# Patient Record
Sex: Male | Born: 1945 | Race: Black or African American | Hispanic: No | Marital: Married | Smoking: Former smoker
Health system: Southern US, Community
[De-identification: ages and names within clinical notes are randomized; demographics above are authoritative.]

## PROBLEM LIST (undated history)

## (undated) DIAGNOSIS — E119 Type 2 diabetes mellitus without complications: Secondary | ICD-10-CM

## (undated) DIAGNOSIS — Z803 Family history of malignant neoplasm of breast: Secondary | ICD-10-CM

## (undated) DIAGNOSIS — C801 Malignant (primary) neoplasm, unspecified: Secondary | ICD-10-CM

## (undated) DIAGNOSIS — Z800 Family history of malignant neoplasm of digestive organs: Secondary | ICD-10-CM

## (undated) HISTORY — DX: Family history of malignant neoplasm of breast: Z80.3

## (undated) HISTORY — DX: Family history of malignant neoplasm of digestive organs: Z80.0

---

## 2001-10-17 ENCOUNTER — Ambulatory Visit (HOSPITAL_COMMUNITY): Admission: RE | Admit: 2001-10-17 | Discharge: 2001-10-17 | Payer: Self-pay | Admitting: Gastroenterology

## 2001-10-17 ENCOUNTER — Encounter (INDEPENDENT_AMBULATORY_CARE_PROVIDER_SITE_OTHER): Payer: Self-pay | Admitting: *Deleted

## 2002-05-15 ENCOUNTER — Encounter: Payer: Self-pay | Admitting: Endocrinology

## 2002-05-15 ENCOUNTER — Ambulatory Visit (HOSPITAL_COMMUNITY): Admission: RE | Admit: 2002-05-15 | Discharge: 2002-05-15 | Payer: Self-pay | Admitting: Endocrinology

## 2007-02-09 ENCOUNTER — Ambulatory Visit (HOSPITAL_BASED_OUTPATIENT_CLINIC_OR_DEPARTMENT_OTHER): Admission: RE | Admit: 2007-02-09 | Discharge: 2007-02-09 | Payer: Self-pay | Admitting: Orthopedic Surgery

## 2007-08-09 ENCOUNTER — Ambulatory Visit (HOSPITAL_COMMUNITY): Admission: RE | Admit: 2007-08-09 | Discharge: 2007-08-09 | Payer: Self-pay | Admitting: Endocrinology

## 2007-08-09 ENCOUNTER — Encounter (INDEPENDENT_AMBULATORY_CARE_PROVIDER_SITE_OTHER): Payer: Self-pay | Admitting: Endocrinology

## 2007-08-09 ENCOUNTER — Ambulatory Visit: Payer: Self-pay | Admitting: *Deleted

## 2011-04-02 NOTE — Op Note (Signed)
NAME:  Aaron Johnston, Aaron Johnston NO.:  [id number]   MEDICAL RECORD NO.:  [id number]          PATIENT TYPE:  AMB   LOCATION:  DSC                          FACILITY:  [facility]   PHYSICIAN:  Loreta Ave, M.D. DATE OF BIRTH:  1946-06-06   DATE OF PROCEDURE:  02/09/2007  DATE OF DISCHARGE:                               OPERATIVE REPORT   PREOPERATIVE DIAGNOSES:  1. Left elbow chronic olecranon bursitis.  2. Olecranon spur which is fractured with partial tearing of triceps      tendon at the origin.   POSTOPERATIVE DIAGNOSES:  1. Left elbow chronic olecranon bursitis.  2. Olecranon spur which is fractured with partial tearing of triceps      tendon at the origin.   OPERATIVE PROCEDURE:  Exploration of left elbow with excision of  olecranon bursa.  Removal of olecranon broken spurs, debridement of the  triceps tendon and the reattachment and repair of tendon back to the  olecranon augmented with a 5.5 mm bioabsorbable anchor and FiberWire  suture.   SURGEON:  Loreta Ave, M.D.   ASSISTANT:  Genene Churn. Denton Meek.   ANESTHESIA:  General.   BLOOD LOSS:  Minimal.   TOURNIQUET TIME:  45 minutes.   SPECIMENS:  None.   CULTURES:  None.   COMPLICATIONS:  None.   DRESSING:  Sterile compressive with a splint and the elbow at 90  degrees.   PROCEDURE:  The patient is brought to the operating room, placed on the  operating table in the supine position.  After adequate anesthesia had  been obtained, a tourniquet was applied to the upper aspect of the left  arm, prepped and draped in the sterile fashion.  Exsanguinated,  elevation of the Esmarch and tourniquet inflated to 250 mmHg.  A  longitudinal incision over the triceps slightly curved medially around  the tip of the elbow.  The skin and subcutaneous tissue divided.  A  chronically swollen, thickened, enlarged olecranon bursa excised in its  entirety, no evidence of infection.  The olecranon exposed.  Spurs and  loose fragments all removed.  Part of these were attached to the triceps  tendon where there was tearing throughout the middle one-third, almost  full thickness.  Tendon debrided back to healthy tissue. Fluoroscopic  confirmation of adequate removal of spurs and loose bodies.  I then  placed a 5.5 mm anchor in the olecranon and used a FiberWire suture to  weave up into the triceps to firmly attach this back down to the  olecranon.  A nice firm reattachment and repair.  I could bring him  through full motion without excessive tension on the repair.  The wound  was irrigated.  I closed with Vicryl and then staples.  A sterile  compressive dressing was applied.  The tourniquet was deflated and  removed.  Roll on splint applied with the knee at about 90 degrees of  flexion.  Anesthesia reversed, brought to the recovery room, tolerated  the procedure well with no complications.      Loreta Ave, M.D.  Electronically Signed  DFM/MEDQ  D:  02/09/2007  T:  02/09/2007  Job:  [id number]

## 2018-11-02 ENCOUNTER — Emergency Department (HOSPITAL_COMMUNITY)
Admission: EM | Admit: 2018-11-02 | Discharge: 2018-11-02 | Disposition: A | Discharge status: Home / Self Care | Payer: No Typology Code available for payment source | Attending: Emergency Medicine | Admitting: Emergency Medicine

## 2018-11-02 ENCOUNTER — Encounter (HOSPITAL_COMMUNITY): Payer: Self-pay | Admitting: *Deleted

## 2018-11-02 ENCOUNTER — Emergency Department (HOSPITAL_COMMUNITY): Payer: No Typology Code available for payment source

## 2018-11-02 ENCOUNTER — Other Ambulatory Visit: Payer: Self-pay

## 2018-11-02 DIAGNOSIS — E119 Type 2 diabetes mellitus without complications: Secondary | ICD-10-CM | POA: Insufficient documentation

## 2018-11-02 DIAGNOSIS — Y939 Activity, unspecified: Secondary | ICD-10-CM | POA: Insufficient documentation

## 2018-11-02 DIAGNOSIS — S52225A Nondisplaced transverse fracture of shaft of left ulna, initial encounter for closed fracture: Secondary | ICD-10-CM

## 2018-11-02 DIAGNOSIS — W1789XA Other fall from one level to another, initial encounter: Secondary | ICD-10-CM | POA: Insufficient documentation

## 2018-11-02 DIAGNOSIS — S52292A Other fracture of shaft of left ulna, initial encounter for closed fracture: Secondary | ICD-10-CM | POA: Diagnosis not present

## 2018-11-02 DIAGNOSIS — Z79899 Other long term (current) drug therapy: Secondary | ICD-10-CM | POA: Diagnosis not present

## 2018-11-02 DIAGNOSIS — Y929 Unspecified place or not applicable: Secondary | ICD-10-CM | POA: Diagnosis not present

## 2018-11-02 DIAGNOSIS — Y999 Unspecified external cause status: Secondary | ICD-10-CM | POA: Insufficient documentation

## 2018-11-02 DIAGNOSIS — S59912A Unspecified injury of left forearm, initial encounter: Secondary | ICD-10-CM | POA: Diagnosis present

## 2018-11-02 HISTORY — DX: Type 2 diabetes mellitus without complications: E11.9

## 2018-11-02 MED ORDER — HYDROCODONE-ACETAMINOPHEN 5-325 MG PO TABS
1.0000 | ORAL_TABLET | Freq: Once | ORAL | Status: AC
  Administered 2018-11-02: 1 via ORAL
  Filled 2018-11-02: qty 1

## 2018-11-02 MED ORDER — HYDROCODONE-ACETAMINOPHEN 5-325 MG PO TABS: 1.0000 | ORAL_TABLET | Freq: Four times a day (QID) | ORAL | 0 refills | Status: DC | PRN

## 2018-11-02 NOTE — ED Provider Notes (Signed)
[facility] DEPT Provider Note   CSN: [id number] Arrival date & time: 11/02/18  2033     History   Chief Complaint Chief Complaint  Patient presents with  . Arm Pain    HPI Jaquavius Hudler is a 72 y.o. male.  HPI Patient with left forearm injury.  [place] getting out of a truck.  States he landed on cement.  Complaining of pain in his mid forearm.  No other injury.  No numbness or weakness.  Not on anticoagulation.  Did not hit his head. Past Medical History:  Diagnosis Date  . Diabetes mellitus without complication ([place])     There are no active problems to display for this patient.   History reviewed. No pertinent surgical history.      Home Medications    Prior to Admission medications   Medication Sig Start Date End Date Taking? Authorizing Provider  latanoprost (XALATAN) 0.005 % ophthalmic solution Place 1 drop into both eyes at bedtime.   Yes [provider]  Multiple Vitamin (MULTIVITAMIN WITH MINERALS) TABS tablet Take 1 tablet by mouth daily.   Yes [provider]  pioglitazone (ACTOS) 45 MG tablet Take 45 mg by mouth daily. 12/28/13  Yes [provider]  ramipril (ALTACE) 5 MG capsule Take 5 mg by mouth daily.   Yes [provider]  sitaGLIPtin (JANUVIA) 100 MG tablet Take 100 mg by mouth daily. 12/27/13  Yes [provider]  HYDROcodone-acetaminophen (NORCO/VICODIN) 5-325 MG tablet Take 1-2 tablets by mouth every 6 (six) hours as needed. 11/02/18   Davonna Belling, MD    Family History No family history on file.  Social History Social History   Tobacco Use  . Smoking status: Never Smoker  Substance Use Topics  . Alcohol use: Yes  . Drug use: Never     Allergies   Patient has no known allergies.   Review of Systems Review of Systems  Constitutional: Negative for appetite change.  Respiratory: Negative for shortness of breath.   Cardiovascular: Negative for chest pain.    Gastrointestinal: Negative for abdominal pain.  Musculoskeletal: Negative for back pain and neck pain.       Left forearm injury.  Neurological: Negative for weakness and numbness.     Physical Exam Updated Vital Signs BP 133/68 (BP Location: Right Arm)   Pulse 70   Temp 98.3 F (36.8 C) (Oral)   Resp 18   SpO2 98%   Physical Exam HENT:     Head: Normocephalic.     Mouth/Throat:     Mouth: Mucous membranes are moist.  Eyes:     Extraocular Movements: Extraocular movements intact.  Cardiovascular:     Rate and Rhythm: Normal rate and regular rhythm.  Pulmonary:     Effort: Pulmonary effort is normal.  Abdominal:     Tenderness: There is no abdominal tenderness.  Musculoskeletal:     Comments: Tenderness over mid to distal ulna on left forearm.  Radial median ulnar nerve sensation in left hand intact.  No tenderness over proximal or distal radius.  No wrist tenderness.  No shoulder tenderness.  Skin:    General: Skin is warm.     Capillary Refill: Capillary refill takes less than 2 seconds.  Neurological:     General: No focal deficit present.     Mental Status: He is alert.      ED Treatments / Results  Labs (all labs ordered are listed, but only abnormal results are displayed) Labs  Reviewed - No data to display  EKG None  Radiology Dg Forearm Left  Result Date: 11/02/2018 CLINICAL DATA:  Left arm injury EXAM: LEFT FOREARM - 2 VIEW COMPARISON:  None. FINDINGS: Acute fracture through the distal shaft of the ulna with minimal volar angulation of the distal fracture fragment. Possible step-off deformity at the radial head neck junction. IMPRESSION: 1. Acute minimally angulated fracture involving the distal shaft of the ulna 2. Possible step-off deformity/fracture at the radial head neck junction. Correlate for focal pain to the elbow and consider dedicated elbow radiographs. Electronically Signed   By: Donavan Foil M.D.   On: 11/02/2018 21:23     Procedures Procedures (including critical care time)  Medications Ordered in ED Medications  HYDROcodone-acetaminophen (NORCO/VICODIN) 5-325 MG per tablet 1 tablet (1 tablet Oral Given 11/02/18 2254)     Initial Impression / Assessment and Plan / ED Course  I have reviewed the triage vital signs and the nursing notes.  Pertinent labs & imaging results that were available during my care of the patient were reviewed by me and considered in my medical decision making (see chart for details).     Patient with fall.  Mid to distal ulnar fracture.  No tenderness over proximal radius.  Placed in splint and will have follow-up with orthopedics.  Discussed with Dr. Lenon Curt and states is the general Ortho follow-up since it is not clearly a distal fracture.  Final Clinical Impressions(s) / ED Diagnoses   Final diagnoses:  Closed nondisplaced transverse fracture of shaft of left ulna, initial encounter    ED Discharge Orders         Ordered    HYDROcodone-acetaminophen (NORCO/VICODIN) 5-325 MG tablet  Every 6 hours PRN     11/02/18 2244           Davonna Belling, MD 11/02/18 2333

## 2018-11-02 NOTE — ED Triage Notes (Signed)
Pt ambulatory to triage, reports he was getting out of a truck, fell onto left arm, reports left forearm pain.

## 2019-05-16 ENCOUNTER — Other Ambulatory Visit: Payer: Self-pay | Admitting: *Deleted

## 2019-05-16 DIAGNOSIS — Z20822 Contact with and (suspected) exposure to covid-19: Secondary | ICD-10-CM

## 2019-05-23 LAB — NOVEL CORONAVIRUS, NAA: SARS-CoV-2, NAA: NOT DETECTED

## 2019-12-12 ENCOUNTER — Ambulatory Visit: Payer: Self-pay

## 2019-12-21 ENCOUNTER — Ambulatory Visit: Payer: Medicare Other | Attending: Internal Medicine

## 2019-12-21 DIAGNOSIS — Z23 Encounter for immunization: Secondary | ICD-10-CM

## 2019-12-21 NOTE — Progress Notes (Signed)
   Covid-19 Vaccination Clinic  Name:  Aaron Johnston    MRN: [id number] DOB: 04-29-1946  12/21/2019  Aaron Johnston was observed post Covid-19 immunization for 15 minutes without incidence. He was provided with Vaccine Information Sheet and instruction to access the V-Safe system.   Aaron Johnston was instructed to call 911 with any severe reactions post vaccine: Marland Kitchen Difficulty breathing  . Swelling of your face and throat  . A fast heartbeat  . A bad rash all over your body  . Dizziness and weakness    Immunizations Administered    Name Date Dose VIS Date Route   Pfizer COVID-19 Vaccine 12/21/2019 12:59 PM 0.3 mL 10/26/2019 Intramuscular   Manufacturer: [place]   Lot: [id number]   [place]: [id number]

## 2019-12-23 ENCOUNTER — Ambulatory Visit: Payer: Self-pay

## 2019-12-31 ENCOUNTER — Ambulatory Visit: Payer: Medicare Other | Attending: Internal Medicine

## 2020-01-15 ENCOUNTER — Ambulatory Visit: Payer: Medicare Other | Attending: Internal Medicine

## 2020-01-15 DIAGNOSIS — Z23 Encounter for immunization: Secondary | ICD-10-CM | POA: Insufficient documentation

## 2020-01-15 NOTE — Progress Notes (Signed)
   Covid-19 Vaccination Clinic  Name:  Aaron Johnston    MRN: [id number] DOB: July 10, 1946  01/15/2020  [name] was observed post Covid-19 immunization for 15 minutes without incident. He was provided with Vaccine Information Sheet and instruction to access the V-Safe system.   [name] was instructed to call 911 with any severe reactions post vaccine: Marland Kitchen Difficulty breathing  . Swelling of face and throat  . A fast heartbeat  . A bad rash all over body  . Dizziness and weakness   Immunizations Administered    Name Date Dose VIS Date Route   Pfizer COVID-19 Vaccine 01/15/2020  1:05 PM 0.3 mL 10/26/2019 Intramuscular   Manufacturer: [place]   Lot: [id number]   [place]: [id number]

## 2020-06-26 ENCOUNTER — Other Ambulatory Visit: Payer: Self-pay | Admitting: Family Medicine

## 2020-06-26 DIAGNOSIS — R1033 Periumbilical pain: Secondary | ICD-10-CM

## 2020-07-07 ENCOUNTER — Other Ambulatory Visit: Payer: Self-pay

## 2020-07-07 ENCOUNTER — Ambulatory Visit
Admission: RE | Admit: 2020-07-07 | Discharge: 2020-07-07 | Disposition: A | Discharge status: Home / Self Care | Payer: Medicare Other | Source: Ambulatory Visit | Attending: Family Medicine | Admitting: Family Medicine

## 2020-07-07 DIAGNOSIS — R1033 Periumbilical pain: Secondary | ICD-10-CM

## 2020-07-07 MED ORDER — IOPAMIDOL (ISOVUE-300) INJECTION 61%
100.0000 mL | Freq: Once | INTRAVENOUS | Status: AC | PRN
  Administered 2020-07-07: 100 mL via INTRAVENOUS

## 2020-07-17 ENCOUNTER — Encounter: Payer: Self-pay | Admitting: Oncology

## 2020-07-22 ENCOUNTER — Telehealth: Payer: Self-pay | Admitting: Oncology

## 2020-07-22 ENCOUNTER — Inpatient Hospital Stay: Payer: Medicare Other | Attending: Oncology | Admitting: Oncology

## 2020-07-22 ENCOUNTER — Other Ambulatory Visit: Payer: Self-pay

## 2020-07-22 VITALS — BP 157/79 | HR 81 | Temp 98.2°F | Resp 20 | Ht 69.0 in | Wt 159.0 lb

## 2020-07-22 DIAGNOSIS — D696 Thrombocytopenia, unspecified: Secondary | ICD-10-CM | POA: Insufficient documentation

## 2020-07-22 DIAGNOSIS — C250 Malignant neoplasm of head of pancreas: Secondary | ICD-10-CM | POA: Insufficient documentation

## 2020-07-22 DIAGNOSIS — Z7189 Other specified counseling: Secondary | ICD-10-CM | POA: Diagnosis not present

## 2020-07-22 DIAGNOSIS — G893 Neoplasm related pain (acute) (chronic): Secondary | ICD-10-CM | POA: Insufficient documentation

## 2020-07-22 DIAGNOSIS — C259 Malignant neoplasm of pancreas, unspecified: Secondary | ICD-10-CM | POA: Insufficient documentation

## 2020-07-22 DIAGNOSIS — E119 Type 2 diabetes mellitus without complications: Secondary | ICD-10-CM | POA: Insufficient documentation

## 2020-07-22 DIAGNOSIS — E876 Hypokalemia: Secondary | ICD-10-CM | POA: Insufficient documentation

## 2020-07-22 DIAGNOSIS — Z23 Encounter for immunization: Secondary | ICD-10-CM | POA: Insufficient documentation

## 2020-07-22 DIAGNOSIS — I10 Essential (primary) hypertension: Secondary | ICD-10-CM | POA: Insufficient documentation

## 2020-07-22 DIAGNOSIS — Z5111 Encounter for antineoplastic chemotherapy: Secondary | ICD-10-CM | POA: Insufficient documentation

## 2020-07-22 MED ORDER — HYDROCODONE-ACETAMINOPHEN 5-325 MG PO TABS: 1.0000 | ORAL_TABLET | ORAL | 0 refills | Status: DC | PRN

## 2020-07-22 NOTE — Progress Notes (Signed)
Placed referral for dietician and CSW.

## 2020-07-22 NOTE — Telephone Encounter (Signed)
A new pt appt has been scheduled for Aaron Johnston to see Dr. Benay Spice today at pm. Appt date and time has been given to the pt's wife. Aware to arrive 15 minutes early.

## 2020-07-22 NOTE — Progress Notes (Addendum)
Aaron Johnston   Requesting MD: Aaron Burnet, Md [address] Aaron Johnston,  [place] [id number]   Aaron Johnston 74 y.o.  1946/02/25    Reason for Johnston: Pancreas cancer   HPI: Aaron Johnston reports a history of lower back pain for the past several months.  He saw a chiropractor and obtain no relief of the pain with chiropractic treatment.  He saw Dr. Alroy Johnston and a CT of the abdomen and pelvis on 07/07/2020 revealed diffuse ill-defined hypodense areas in the liver concerning for an infiltrative neoplasm.  Minimal dilatation of the intrahepatic biliary tree.  Atrophy of the body and tail the pancreas is progressive compared to a CT on 03/04/2020.  A 2 x 1 point centimeter focal area of prominence was noted in the pancreas.  An infiltrative retroperitoneal mass encases the celiac axis and SMA.  The mass has progressed compared to the previous CT.  Nonocclusive thrombus was noted in the main portal vein with high-grade narrowing of the proximal portal vein with associated cavernous transformation.  A 1.5 x 3 point centimeter portacaval soft tissue density is felt to represent an enlarged lymph node versus infiltration of the retroperitoneal mass.  The prostate gland is enlarged.  He was referred to Dr. Earlean Johnston.  The CA 19-9 returned elevated at 6373 on 07/02/2020.  He was subsequently referred to Dr. Jerene Johnston for an endoscopic ultrasound on 07/10/2020.  Sludge was seen in the gallbladder.  No pancreas mass was identified.  Tumor thrombus was noted in the PV and SV.  The retroperitoneal mass could not be visualized.  There was a small amount of ascites.  A 1.5 cm aortocaval node was biopsied.  The cytology returned as scant lymphoid material.  No malignancy identified.  Scant cellularity.  He was referred to Dr. Adair Johnston and was taken to the operating room on 07/17/2020 for Port-A-Cath placement and a diagnostic laparoscopy.  There was no ascites or peritoneal  disease.  A 0.8 cm nodule was noted adjacent to the falciform ligament.  There were other nodules.  Segment 3 in segment 4 nodules were biopsied.  A preliminary pathology returned as adenocarcinoma on the segment 3 lesion.  Final pathology is pending.  Aaron Johnston reports improvement in back pain, but he now has abdominal pain.  He was seen by urology in April for urinary frequency.  He was noted to have microscopic hematuria.  A CT of the abdomen and pelvis on 03/04/2020 revealed haziness in the fat of the retroperitoneum and upper abdomen adjacent to the celiac axis and SMA.  No pancreatic mass was identified.  Collateral vessels were noted in the upper abdomen.  No lymphadenopathy.    Past Medical History:  Diagnosis Date  . Diabetes mellitus without complication ([place])     .  Hypertension   .  Chronic thrombocytopenia  Past surgical history: Toe surgery Medications: Reviewed  Allergies: No Known Allergies  Family history: His father died of pancreas cancer at age 45  Social History:   He lives in [place].  He is a retired Airline pilot.  He has a Engineer, technical sales.  He quit smoking cigarettes 20 years ago.  Occasional alcohol use.  No transfusion history.  No risk factor for HIV or hepatitis.  ROS:   Positives include: Anorexia, 20 pound weight loss, diarrhea after eating for the past 2 to 3 weeks, mild headache for 2 to 3 days, low back pain, abdominal pain  A complete ROS was  otherwise negative.  Physical Exam:  Blood pressure (!) 157/79, pulse 81, temperature 98.2 F (36.8 C), temperature source Tympanic, resp. rate 20, height 5\' 9"  (1.753 m), weight 159 lb (72.1 kg), SpO2 100 %.  HEENT: Neck without mass Lungs: Clear bilaterally Cardiac: Regular rate and rhythm Abdomen: No hepatosplenomegaly, no mass, nontender GU: Testes without mass Vascular: No leg edema Lymph nodes: No cervical, supraclavicular, axillary, or inguinal nodes Neurologic: Alert and  oriented, the motor exam appears intact in the upper and lower extremities bilaterally Skin: No rash, healed abdominal incisions Musculoskeletal: No spine tenderness   LAB: [facility] 07/02/2020: CA 19-9 -6373  Creatinine 0.87, bilirubin 1.5, AST 33, ALT 65 Hemoglobin 13.5, platelets 76,000, white count 4.9, ANC 3.2  07/14/2020: Hemoglobin 13.4, platelets 66,000, WBC 4.2, MPV 11.5  Blood smear 07/24/2020: Few ovalocytes and acanthocytes.  The white cell morphology is unremarkable.  The platelets appear mildly decreased in number.  No platelet clumps.  The majority of the platelets are small. Imaging:  CT images from 07/07/2020-reviewed with Aaron Johnston and his wife    Assessment/Plan:   1. 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, nonocclusive thrombus within the main portal vein, mild biliary ductal dilatation, diffuse hypodense/hypoenhancing areas within the liver (edema versus infiltrating neoplasm), 2 x 1.7 cm area of focal prominence in the pancreas  Diagnostic laparoscopy 07/17/2020-segment 3 and 4 liver nodules, biopsied  Elevated CA 19-9  2. Chronic thrombocytopenia 3. Diabetes 4. Hypertension 5. Abdomen/back pain secondary to #1   Disposition:   Aaron Johnston has radiologic, clinical, and laboratory evidence consistent with a diagnosis of metastatic pancreas cancer.  He understands no therapy will be curative.  I discussed the prognosis and treatment options with Aaron Johnston and his wife.  We reviewed the CT images.  We will follow up on the final pathology from the liver biopsy.  I recommend treatment with FOLFIRINOX.  We reviewed potential toxicities associated with the  FOLFIRINOX regimen including the chance for nausea/vomiting, mucositis, diarrhea, alopecia, and hematologic toxicity.  We discussed the rash, hyperpigmentation, sun sensitivity, and hand/foot syndrome associated with 5-fluorouracil.  We reviewed the allergic reaction and various types of neuropathy seen with oxaliplatin.  We discussed the acute and delayed diarrhea from irinotecan.  He agrees to proceed.  He will attend a chemotherapy teaching class.  He will complete 1 cycle of FOLFOX with the plan to add irinotecan to the regimen beginning with cycle 2 or cycle 3.  He will be referred to the genetics counselor.  We will request Foundation 1 testing on the liver biopsy tissue.  I will present his case at the GI tumor conference.  He reports a chronic history of thrombocytopenia.  He had moderate thrombocytopenia on recent lab studies at [facility].  We will follow up on records from Dr. Alroy Johnston and I will review the peripheral blood smear when he is here for baseline lab studies later this week.  We will dose reduce the chemotherapy based on the platelet count.  Aaron Johnston will return for an office visit prior to beginning chemotherapy on 07/28/2020.  Betsy Coder, MD  07/25/2020, 1:45 PM

## 2020-07-22 NOTE — Progress Notes (Signed)
START ON PATHWAY REGIMEN - Pancreatic Adenocarcinoma     A cycle is every 14 days:     Oxaliplatin      Leucovorin      Irinotecan      Fluorouracil   **Always confirm dose/schedule in your pharmacy ordering system**  Patient Characteristics: Metastatic Disease, First Line, PS = 0,1, BRCA1/2 and PALB2  Mutation Absent/Unknown Therapeutic Status: Metastatic Disease Line of Therapy: First Line ECOG Performance Status: 1 BRCA1/2 Mutation Status: Awaiting Test Results PALB2 Mutation Status: Awaiting Test Results Intent of Therapy: Non-Curative / Palliative Intent, Discussed with Patient 

## 2020-07-23 ENCOUNTER — Telehealth: Payer: Self-pay | Admitting: *Deleted

## 2020-07-23 ENCOUNTER — Encounter: Payer: Self-pay | Admitting: Medical Oncology

## 2020-07-23 ENCOUNTER — Telehealth: Payer: Self-pay | Admitting: Oncology

## 2020-07-23 ENCOUNTER — Other Ambulatory Visit: Payer: Self-pay

## 2020-07-23 ENCOUNTER — Other Ambulatory Visit: Payer: Self-pay | Admitting: *Deleted

## 2020-07-23 ENCOUNTER — Encounter: Payer: Self-pay | Admitting: *Deleted

## 2020-07-23 ENCOUNTER — Inpatient Hospital Stay: Payer: Medicare Other | Admitting: Nutrition

## 2020-07-23 DIAGNOSIS — C259 Malignant neoplasm of pancreas, unspecified: Secondary | ICD-10-CM

## 2020-07-23 MED ORDER — PANCRELIPASE (LIP-PROT-AMYL) 36000-114000 UNITS PO CPEP: ORAL_CAPSULE | ORAL | 5 refills | Status: DC

## 2020-07-23 MED ORDER — ONDANSETRON HCL 8 MG PO TABS: 8.0000 mg | ORAL_TABLET | Freq: Three times a day (TID) | ORAL | 2 refills | Status: DC | PRN

## 2020-07-23 MED ORDER — LIDOCAINE-PRILOCAINE 2.5-2.5 % EX CREA: 1.0000 "application " | TOPICAL_CREAM | CUTANEOUS | 2 refills | Status: DC

## 2020-07-23 MED ORDER — PROCHLORPERAZINE MALEATE 10 MG PO TABS: 10.0000 mg | ORAL_TABLET | Freq: Four times a day (QID) | ORAL | 1 refills | Status: DC | PRN

## 2020-07-23 NOTE — Progress Notes (Signed)
Scripts for EMLA, Compazine, Zofran and Creon sent to pharmacy. Notified research department that he is interested in [employer] study. Can meet w/ him before or after his chemo education class.

## 2020-07-23 NOTE — Progress Notes (Signed)
[employer]: Blood Sample Collection to Evaluate Biomarkers in Subjects with Untreated Solid Tumors Dr. Sherrill referred patient to study. I spoke with patient via phone, this afternoon, introducing myself and confirmed that Dr. Sherrill provided patient with a brief explanation about this study and that patient was interested. Patient confirmed he was interested and we made plans to meet after his appointment today with nutritionist, Barbara Neff.  Patient, myself and Candace Smith, CRC, met this afternoon and we reviewed the study ICF and authorization forms, Version 1.0, page by page. Patient confirmed understanding the purpose of the study, that his participation is voluntary, the length of his participation, any benefits to his participating, what information is collected and who will have access to his information, as well as compensation of a $50 gift card, provided by the study. After both forms were reviewed, patient was asked if he had any questions, patient denied having any. Patient did express wanting to take the consent home to review with family and stated he would inform me tomorrow, prior to his scheduled lab appointment. Patient was provided with my contact information and the information pamphlet on Clinical Trials. I thanked patient for his time and interest in study and encouraged him to call me with questions.  Approximately 15 minutes was spent with patient going over the study.  Second eligibility verified by CRC Candace Smith as well as confirmed by Dr. Sherrill.   [employer]: Stool Collection Sub-study (2018-01B) of [employer] Protocol 2018-01: "Blood Sample Collection to Evaluate Biomarkers in Subjects with Untreated Solid Tumors" Candace Smith, CRC reviewed this study with patient, and myself present. Patient declined participation to this sub-study.   C. , RN, BSN, CCRC Clinical Research 07/23/2020 4:35 PM  

## 2020-07-23 NOTE — Progress Notes (Signed)
[place] Work  Clinical Social Work received a referral from Therapist, sports for adjusting to illness.  CSW met with patient in the patient and family support center.  CSW provided education on CSW role and information on the support team and support services at [facility].  CSW and patient discussed common feeling and emotions when being diagnosed with cancer and the importance of support.  Patient identified his wife a positive support.  CSW provided patient with September program calendar and upcoming programing.  CSW also encouraged patient to share resources with his wife.  Patient was appreciative of CSW contact and know to call with questions or concerns.  Johnnye Lana, MSW, LCSW, OSW-C Clinical Social Worker [facility] [phone]

## 2020-07-23 NOTE — Telephone Encounter (Signed)
Spoke with patient today who is aware of his nutrition appointment later this afternoon. Will have an updated calendar printed for patient at time of check in.

## 2020-07-23 NOTE — Telephone Encounter (Signed)
Called to inquire about something for diarrhea MD mentioned calling in yesterday. Informed him that the script for Creon sent in today is supposed to control his diarrhea. He can supplement if needed w/Imodium.

## 2020-07-23 NOTE — Progress Notes (Signed)
74 year old male diagnosed with pancreas cancer.  He is a patient of Dr. Benay Spice.  Plan is for FOLFIRINOX.  Past medical history includes diabetes, hypertension  Medications include Compazine, Zofran, Creon, Actos, and Januvia.  Recent labs not available.  Height: 5 feet 9 inches. Weight: 159 pounds on September 7. Usual body weight: 185 pounds per patient. BMI: 23.48.  Patient reports he is experiencing early satiety. Admits to about a 30 pound weight loss. Reports he is having loose stools after he eats.  He also has abdominal pain.  Noted Creon was added. Patient reports blood sugars are generally good since he has lost a lot of weight.  Nutrition diagnosis:  Food and nutrition related knowledge deficit related to pancreas cancer and associated treatments as evidenced by no prior need for nutrition related information.  Intervention: Educated patient on the importance of smaller more frequent meals and snacks. Reviewed high-calorie high-protein foods. Recommended oral nutrition supplements.  Provided samples.  Encouraged two cartons daily. Brief education on no concentrated sweets diet. Provided fact sheets.  Questions were answered.  Teach back method used.  Contact information given.  Monitoring, evaluation, goals: Patient will tolerate increased calories and protein to minimize further weight loss.  Next visit: Monday, September 13 during infusion.  **Disclaimer: This note was dictated with voice recognition software. Similar sounding words can inadvertently be transcribed and this note may contain transcription errors which may not have been corrected upon publication of note.**

## 2020-07-23 NOTE — Telephone Encounter (Signed)
Scheduled appointments per 9/7 los. Patient is aware of appointment details.

## 2020-07-24 ENCOUNTER — Inpatient Hospital Stay: Payer: Medicare Other

## 2020-07-24 ENCOUNTER — Encounter: Payer: Self-pay | Admitting: Oncology

## 2020-07-24 ENCOUNTER — Encounter: Payer: Self-pay | Admitting: Radiology

## 2020-07-24 DIAGNOSIS — E876 Hypokalemia: Secondary | ICD-10-CM | POA: Diagnosis not present

## 2020-07-24 DIAGNOSIS — Z23 Encounter for immunization: Secondary | ICD-10-CM | POA: Diagnosis not present

## 2020-07-24 DIAGNOSIS — C259 Malignant neoplasm of pancreas, unspecified: Secondary | ICD-10-CM

## 2020-07-24 DIAGNOSIS — G893 Neoplasm related pain (acute) (chronic): Secondary | ICD-10-CM | POA: Diagnosis not present

## 2020-07-24 DIAGNOSIS — D696 Thrombocytopenia, unspecified: Secondary | ICD-10-CM | POA: Diagnosis not present

## 2020-07-24 DIAGNOSIS — E119 Type 2 diabetes mellitus without complications: Secondary | ICD-10-CM | POA: Diagnosis not present

## 2020-07-24 DIAGNOSIS — Z95828 Presence of other vascular implants and grafts: Secondary | ICD-10-CM

## 2020-07-24 DIAGNOSIS — Z5111 Encounter for antineoplastic chemotherapy: Secondary | ICD-10-CM | POA: Diagnosis not present

## 2020-07-24 DIAGNOSIS — I10 Essential (primary) hypertension: Secondary | ICD-10-CM | POA: Diagnosis not present

## 2020-07-24 LAB — CBC WITH DIFFERENTIAL (CANCER CENTER ONLY)
Abs Immature Granulocytes: 0.01 10*3/uL (ref 0.00–0.07)
Basophils Absolute: 0 10*3/uL (ref 0.0–0.1)
Basophils Relative: 0 %
Eosinophils Absolute: 0.2 10*3/uL (ref 0.0–0.5)
Eosinophils Relative: 4 %
HCT: 36.7 % — ABNORMAL LOW (ref 39.0–52.0)
Hemoglobin: 12.1 g/dL — ABNORMAL LOW (ref 13.0–17.0)
Immature Granulocytes: 0 %
Lymphocytes Relative: 16 %
Lymphs Abs: 0.8 10*3/uL (ref 0.7–4.0)
MCH: 29.9 pg (ref 26.0–34.0)
MCHC: 33 g/dL (ref 30.0–36.0)
MCV: 90.6 fL (ref 80.0–100.0)
Monocytes Absolute: 0.3 10*3/uL (ref 0.1–1.0)
Monocytes Relative: 6 %
Neutro Abs: 3.6 10*3/uL (ref 1.7–7.7)
Neutrophils Relative %: 74 %
Platelet Count: 77 10*3/uL — ABNORMAL LOW (ref 150–400)
RBC: 4.05 MIL/uL — ABNORMAL LOW (ref 4.22–5.81)
RDW: 12.7 % (ref 11.5–15.5)
WBC Count: 4.9 10*3/uL (ref 4.0–10.5)
nRBC: 0 % (ref 0.0–0.2)

## 2020-07-24 LAB — CMP (CANCER CENTER ONLY)
ALT: 80 U/L — ABNORMAL HIGH (ref 0–44)
AST: 42 U/L — ABNORMAL HIGH (ref 15–41)
Albumin: 3.6 g/dL (ref 3.5–5.0)
Alkaline Phosphatase: 146 U/L — ABNORMAL HIGH (ref 38–126)
Anion gap: 7 (ref 5–15)
BUN: 12 mg/dL (ref 8–23)
CO2: 29 mmol/L (ref 22–32)
Calcium: 9.5 mg/dL (ref 8.9–10.3)
Chloride: 104 mmol/L (ref 98–111)
Creatinine: 0.99 mg/dL (ref 0.61–1.24)
GFR, Est AFR Am: >60 mL/min (ref >60)
GFR, Estimated: >60 mL/min (ref >60)
Glucose, Bld: 335 mg/dL — ABNORMAL HIGH (ref 70–99)
Potassium: 3.9 mmol/L (ref 3.5–5.1)
Sodium: 140 mmol/L (ref 135–145)
Total Bilirubin: 1.2 mg/dL (ref 0.3–1.2)
Total Protein: 6.9 g/dL (ref 6.5–8.1)

## 2020-07-24 LAB — SAVE SMEAR(SSMR), FOR PROVIDER SLIDE REVIEW

## 2020-07-24 MED ORDER — HEPARIN SOD (PORK) LOCK FLUSH 100 UNIT/ML IV SOLN
500.0000 [IU] | Freq: Once | INTRAVENOUS | Status: AC
  Administered 2020-07-24: 500 [IU] via INTRAVENOUS
  Filled 2020-07-24: qty 5

## 2020-07-24 MED ORDER — SODIUM CHLORIDE 0.9% FLUSH
10.0000 mL | INTRAVENOUS | Status: DC | PRN
  Administered 2020-07-24: 10 mL via INTRAVENOUS
  Filled 2020-07-24: qty 10

## 2020-07-24 NOTE — Progress Notes (Signed)
Met with patient at registration to introduce myself as Arboriculturist and to offer available resources.  Discussed one-time $1000 Radio broadcast assistant to assist with personal expenses while going through treatment.  Gave him my card if interested and for any additional financial questions or concerns.

## 2020-07-24 NOTE — Progress Notes (Signed)
Ordered Foundation One on pathology from [place]

## 2020-07-24 NOTE — Research (Signed)
[employer]: Blood Sample Collection to Evaluate Biomarkers in Subjects with Untreated Solid Tumors  07/24/20    3:10PM  CONSENT/QUESTIONNAIRE/LABS: Met with patient who was accompanied by his wife in the waiting area just after arrival. Confirmed patient's interest in participation of study. Verified patient did not have any questions or concerns and this was confirmed. Patient and his wife stated they read through the consent form after thorough discussion of consent yesterday with Otilio Miu, Clinical Research Nurse. Aaron Johnston signed consent form and HIPPA form (version 3.0, dated Sep 12, 2018). Patient was then taken back to the port flush room where labs were collected in appropriate order via port. Patient requested port access rather than intravenous access. Tubes were collected in order (1 red and gray tiger top and LBgard tubes 1-4). Patient had no issue. Aaron Johnston signed confirmation of receipt of [employer] gift card form and $50 [employer] gift card was given to patient along with instructions on how to activate card. I then asked Aaron Johnston all history worksheet questions, including further medical history questions and filled out the form as answered by patient.  Patient was thanked for his time.   [place] RT(R)(T) Scientist, physiological

## 2020-07-25 LAB — CANCER ANTIGEN 19-9: CA 19-9: 11768 U/mL — ABNORMAL HIGH (ref 0–35)

## 2020-07-25 NOTE — Progress Notes (Signed)
Pharmacist Chemotherapy Monitoring - Initial Assessment    Anticipated start date: 07/28/20  Regimen:  . Are orders appropriate based on the patient's diagnosis, regimen, and cycle? Yes . Does the plan date match the patient's scheduled date? Yes . Is the sequencing of drugs appropriate? Yes . Are the premedications appropriate for the patient's regimen? Yes . Prior Authorization for treatment is: Approved o If applicable, is the correct biosimilar selected based on the patient's insurance? not applicable  Organ Function and Labs: Marland Kitchen Are dose adjustments needed based on the patient's renal function, hepatic function, or hematologic function? Yes . Are appropriate labs ordered prior to the start of patient's treatment? Yes . Other organ system assessment, if indicated: N/A . The following baseline labs, if indicated, have been ordered: N/A  Dose Assessment: . Are the drug doses appropriate? Yes . Are the following correct: o Drug concentrations Yes o IV fluid compatible with drug Yes o Administration routes Yes o Timing of therapy Yes . If applicable, does the patient have documented access for treatment and/or plans for port-a-cath placement? yes . If applicable, have lifetime cumulative doses been properly documented and assessed? not applicable Lifetime Dose Tracking  No doses have been documented on this patient for the following tracked chemicals: Doxorubicin, Epirubicin, Idarubicin, Daunorubicin, Mitoxantrone, Bleomycin, Oxaliplatin, Carboplatin, Liposomal Doxorubicin  o   Toxicity Monitoring/Prevention: . The patient has the following take home antiemetics prescribed: Prochlorperazine . The patient has the following take home medications prescribed: N/A . Medication allergies and previous infusion related reactions, if applicable, have been reviewed and addressed. Yes . The patient's current medication list has been assessed for drug-drug interactions with their chemotherapy  regimen. no significant drug-drug interactions were identified on review.  Order Review: . Are the treatment plan orders signed? No . Is the patient scheduled to see a provider prior to their treatment? Yes  I verify that I have reviewed each item in the above checklist and answered each question accordingly.  Aaron Johnston 07/25/2020 2:14 PM

## 2020-07-27 ENCOUNTER — Other Ambulatory Visit: Payer: Self-pay | Admitting: Oncology

## 2020-07-28 ENCOUNTER — Inpatient Hospital Stay (HOSPITAL_BASED_OUTPATIENT_CLINIC_OR_DEPARTMENT_OTHER): Payer: Medicare Other | Admitting: Oncology

## 2020-07-28 ENCOUNTER — Inpatient Hospital Stay: Payer: Medicare Other

## 2020-07-28 ENCOUNTER — Other Ambulatory Visit: Payer: Self-pay

## 2020-07-28 ENCOUNTER — Inpatient Hospital Stay: Payer: Medicare Other | Admitting: Nutrition

## 2020-07-28 ENCOUNTER — Encounter: Payer: Self-pay | Admitting: *Deleted

## 2020-07-28 VITALS — BP 144/81 | HR 85 | Temp 97.4°F | Resp 20 | Ht 69.0 in | Wt 158.3 lb

## 2020-07-28 DIAGNOSIS — Z5111 Encounter for antineoplastic chemotherapy: Secondary | ICD-10-CM | POA: Diagnosis not present

## 2020-07-28 DIAGNOSIS — C259 Malignant neoplasm of pancreas, unspecified: Secondary | ICD-10-CM | POA: Diagnosis not present

## 2020-07-28 DIAGNOSIS — C250 Malignant neoplasm of head of pancreas: Secondary | ICD-10-CM

## 2020-07-28 LAB — GLUCOSE, CAPILLARY: Glucose-Capillary: 329 mg/dL — ABNORMAL HIGH (ref 70–99)

## 2020-07-28 MED ORDER — DEXTROSE 5 % IV SOLN
Freq: Once | INTRAVENOUS | Status: AC
  Filled 2020-07-28: qty 250

## 2020-07-28 MED ORDER — LEUCOVORIN CALCIUM INJECTION 350 MG
400.0000 mg/m2 | Freq: Once | INTRAVENOUS | Status: AC
  Administered 2020-07-28: 748 mg via INTRAVENOUS
  Filled 2020-07-28: qty 37.4

## 2020-07-28 MED ORDER — SODIUM CHLORIDE 0.9 % IV SOLN
2400.0000 mg/m2 | INTRAVENOUS | Status: DC
  Administered 2020-07-28: 4500 mg via INTRAVENOUS
  Filled 2020-07-28: qty 90

## 2020-07-28 MED ORDER — SODIUM CHLORIDE 0.9 % IV SOLN: 400.0000 mg/m2 | Freq: Once | INTRAVENOUS | Status: DC

## 2020-07-28 MED ORDER — SODIUM CHLORIDE 0.9 % IV SOLN
10.0000 mg | Freq: Once | INTRAVENOUS | Status: AC
  Administered 2020-07-28: 10 mg via INTRAVENOUS
  Filled 2020-07-28: qty 10

## 2020-07-28 MED ORDER — OXALIPLATIN CHEMO INJECTION 100 MG/20ML
65.0000 mg/m2 | Freq: Once | INTRAVENOUS | Status: AC
  Administered 2020-07-28: 120 mg via INTRAVENOUS
  Filled 2020-07-28: qty 20

## 2020-07-28 MED ORDER — PALONOSETRON HCL INJECTION 0.25 MG/5ML
0.2500 mg | Freq: Once | INTRAVENOUS | Status: AC
  Administered 2020-07-28: 0.25 mg via INTRAVENOUS

## 2020-07-28 MED ORDER — PALONOSETRON HCL INJECTION 0.25 MG/5ML
INTRAVENOUS | Status: AC
  Filled 2020-07-28: qty 5

## 2020-07-28 NOTE — Patient Instructions (Signed)
[place] Discharge Instructions for Patients Receiving Chemotherapy  Today you received the following chemotherapy agents oxaliplatin, Leucovorin, adrucil  [place] Discharge Instructions for Patients receiving Home Portable Chemo Pump    **The bag should finish at 46 hours, 96 hours or 7 days. For example, if your pump is scheduled for 46 hours and it was put on at 4pm, it should finish at 2 pm the day it is scheduled to come off regardless of your appointment time.    Estimated time to finish   _________________________ (Have your nurse fill in)     ** if the display on your pump reads "Low Volume" and it is beeping, take the batteries out of the pump and come to the cancer center for it to be taken off.   **If the pump alarms go off prior to the pump reading "Low Volume" then call the [phone] and someone can assist you.  **If the plunger comes out and the bag fluid is running out, please use your chemo spill kit to clean up the spill. Do not use paper towels or other house hold products.  ** If you have problems or questions regarding your pump, please call either the [phone] or the cancer center Monday-Friday 8:00am-4:30pm at [phone] and we will assist you.  If you are unable to get assistance then go to [facility] Emergency Room, ask the staff to contact the IV team for assistance.    To help prevent nausea and vomiting after your treatment, we encourage you to take your nausea medication as directed by your provider   If you develop nausea and vomiting that is not controlled by your nausea medication, call the clinic.   BELOW ARE SYMPTOMS THAT SHOULD BE REPORTED IMMEDIATELY:  *FEVER GREATER THAN 100.5 F  *CHILLS WITH OR WITHOUT FEVER  NAUSEA AND VOMITING THAT IS NOT CONTROLLED WITH YOUR NAUSEA MEDICATION  *UNUSUAL SHORTNESS OF BREATH  *UNUSUAL BRUISING OR BLEEDING  TENDERNESS IN MOUTH AND THROAT WITH OR WITHOUT  PRESENCE OF ULCERS  *URINARY PROBLEMS  *BOWEL PROBLEMS  UNUSUAL RASH Items with * indicate a potential emergency and should be followed up as soon as possible.  Feel free to call the clinic should you have any questions or concerns. The clinic phone number is [phone].  Please show the [place] at check-in to the Emergency Department and triage nurse.

## 2020-07-28 NOTE — Progress Notes (Signed)
Nutrition follow-up completed with patient during infusion for pancreas cancer. Weight stable at 158.3 pounds September 13 from 159 pounds September 7. Patient reports diarrhea has improved since beginning Creon. Patient reports he wants to consume an oral nutrition supplement that does not have added sugar. He has no nutrition impact symptoms.  Nutrition diagnosis: Food and nutrition related knowledge deficit improved.  Intervention: Continue frequent small meals and snacks for weight maintenance. Try boost glucose control or Glucerna as oral nutrition supplements.  Monitoring, evaluation, goals: Patient will continue to tolerate adequate calories and protein for weight maintenance.  Next visit: Patient has my contact information please refer back to RD if nutrition needs are identified.  **Disclaimer: This note was dictated with voice recognition software. Similar sounding words can inadvertently be transcribed and this note may contain transcription errors which may not have been corrected upon publication of note.**

## 2020-07-28 NOTE — Progress Notes (Signed)
Ok to treat with plts 77 per Dr. Benay Spice.  Oxali dose reduced.  Hold irinotecan this cycle.    CBG checked per Dr. Benay Spice.  329.  MD notified.

## 2020-07-28 NOTE — Progress Notes (Signed)
  [place] OFFICE PROGRESS NOTE   Diagnosis: Pancreas cancer  INTERVAL HISTORY:   Aaron Johnston returns as scheduled.  He reports improvement in diarrhea since starting Creon.  No pain.  No complaint today.  He has attended a chemotherapy teaching class.  Objective:  Vital signs in last 24 hours:  Blood pressure (!) 144/81, pulse 85, temperature (!) 97.4 F (36.3 C), temperature source Tympanic, resp. rate 20, height 5\' 9"  (1.753 m), weight 158 lb 4.8 oz (71.8 kg), SpO2 100 %.    Resp: Lungs clear bilaterally Cardio: Regular rate and rhythm GI: No hepatosplenomegaly, nontender, no mass Vascular: No leg edema     Portacath/PICC-without erythema  Lab Results:  Lab Results  Component Value Date   WBC 4.9 07/24/2020   HGB 12.1 (L) 07/24/2020   HCT 36.7 (L) 07/24/2020   MCV 90.6 07/24/2020   PLT 77 (L) 07/24/2020   NEUTROABS 3.6 07/24/2020    CMP  Lab Results  Component Value Date   NA 140 07/24/2020   K 3.9 07/24/2020   CL 104 07/24/2020   CO2 29 07/24/2020   GLUCOSE 335 (H) 07/24/2020   BUN 12 07/24/2020   CREATININE 0.99 07/24/2020   CALCIUM 9.5 07/24/2020   PROT 6.9 07/24/2020   ALBUMIN 3.6 07/24/2020   AST 42 (H) 07/24/2020   ALT 80 (H) 07/24/2020   ALKPHOS 146 (H) 07/24/2020   BILITOT 1.2 07/24/2020   GFRNONAA >60 07/24/2020   GFRAA >60 07/24/2020     Medications: I have reviewed the patient's current medications.   Assessment/Plan: 1. 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 Chronic thrombocytopenia 3. Diabetes 4. Hypertension 5. Abdomen/back pain secondary to #1     Disposition: Aaron Johnston appears stable.  He has been diagnosed with metastatic pancreas cancer.  The plan is to begin treatment with FOLFIRINOX.  He has attended a chemotherapy teaching class.  I reviewed potential toxicities associated with the FOLFIRINOX regimen again today.  He agrees to proceed.  Irinotecan will be held from cycle 1.  Aaron Johnston has chronic thrombocytopenia, dating back to at least 2016 upon review of records from his primary provider.  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  The oxaliplatin will be dose reduced with cycle 1.  He will contact us for bleeding or bruising.  Aaron Johnston will return for an office visit and chemotherapy in 2 weeks.  Betsy Coder, MD  07/28/2020  8:18 AM

## 2020-07-28 NOTE — Progress Notes (Signed)
Informed patient he needs to follow up with his PCP about his elevated glucose readings. He reports Dr. Donnie Coffin manages his diabetes. Faxed office note all labs collected today to Dr. Donnie Coffin [phone].

## 2020-07-28 NOTE — Progress Notes (Signed)
Per Dr. Benay Spice: OK to treat w/platelet count 77,000. Holding irinotecan today and dose reducing oxaliplatin. OK to treat w/elevated glucose from 07/24/20--will check cbg in infusion today. Patient instructed to check his blood sugars at home daily and call for > 300

## 2020-07-29 ENCOUNTER — Telehealth: Payer: Self-pay | Admitting: *Deleted

## 2020-07-29 ENCOUNTER — Telehealth: Payer: Self-pay | Admitting: Oncology

## 2020-07-29 NOTE — Telephone Encounter (Signed)
Scheduled appointments per 9/14 los. Patient is aware of upcoming appointments.

## 2020-07-29 NOTE — Telephone Encounter (Signed)
Spoke w/wife regarding call to answering service today for glucose of 89 after administration of 10 units of Humalog insulin per Dr. [name]. His office called back today and said to check bid and give 10 units for > 300 and 15 units for > 350

## 2020-07-30 ENCOUNTER — Other Ambulatory Visit: Payer: Self-pay

## 2020-07-30 ENCOUNTER — Inpatient Hospital Stay: Payer: Medicare Other

## 2020-07-30 VITALS — BP 144/76 | HR 84 | Temp 97.9°F | Resp 18

## 2020-07-30 DIAGNOSIS — Z5111 Encounter for antineoplastic chemotherapy: Secondary | ICD-10-CM | POA: Diagnosis not present

## 2020-07-30 DIAGNOSIS — C250 Malignant neoplasm of head of pancreas: Secondary | ICD-10-CM

## 2020-07-30 MED ORDER — SODIUM CHLORIDE 0.9% FLUSH
10.0000 mL | INTRAVENOUS | Status: DC | PRN
  Administered 2020-07-30: 10 mL
  Filled 2020-07-30: qty 10

## 2020-07-30 MED ORDER — HEPARIN SOD (PORK) LOCK FLUSH 100 UNIT/ML IV SOLN
500.0000 [IU] | Freq: Once | INTRAVENOUS | Status: AC | PRN
  Administered 2020-07-30: 500 [IU]
  Filled 2020-07-30: qty 5

## 2020-07-30 NOTE — Patient Instructions (Signed)

## 2020-07-31 NOTE — Progress Notes (Signed)
[employer]: Blood Sample Collection to Evaluate Biomarkers in Subjects with Untreated Solid Tumors Addendum created to add study kit # [id number] for specimen collection.   Maxwell [name], RN, BSN, [facility] Clinical Research 07/31/2020 1:54 PM

## 2020-07-31 NOTE — Progress Notes (Signed)
I received a call from the pathology department at [facility] that the specimen was sent out for Foundation One testing yesterday 9/15.

## 2020-08-11 ENCOUNTER — Inpatient Hospital Stay: Payer: Medicare Other

## 2020-08-11 ENCOUNTER — Other Ambulatory Visit: Payer: Self-pay

## 2020-08-11 ENCOUNTER — Telehealth: Payer: Self-pay | Admitting: Nurse Practitioner

## 2020-08-11 ENCOUNTER — Encounter: Payer: Self-pay | Admitting: Nurse Practitioner

## 2020-08-11 ENCOUNTER — Inpatient Hospital Stay (HOSPITAL_BASED_OUTPATIENT_CLINIC_OR_DEPARTMENT_OTHER): Payer: Medicare Other | Admitting: Nurse Practitioner

## 2020-08-11 ENCOUNTER — Other Ambulatory Visit: Payer: Self-pay | Admitting: Oncology

## 2020-08-11 ENCOUNTER — Telehealth: Payer: Self-pay

## 2020-08-11 ENCOUNTER — Other Ambulatory Visit: Payer: Self-pay | Admitting: Nurse Practitioner

## 2020-08-11 VITALS — BP 141/67 | HR 80 | Temp 97.7°F | Resp 19 | Ht 69.0 in | Wt 159.1 lb

## 2020-08-11 DIAGNOSIS — C250 Malignant neoplasm of head of pancreas: Secondary | ICD-10-CM

## 2020-08-11 DIAGNOSIS — Z5111 Encounter for antineoplastic chemotherapy: Secondary | ICD-10-CM | POA: Diagnosis not present

## 2020-08-11 DIAGNOSIS — C259 Malignant neoplasm of pancreas, unspecified: Secondary | ICD-10-CM

## 2020-08-11 DIAGNOSIS — Z23 Encounter for immunization: Secondary | ICD-10-CM

## 2020-08-11 DIAGNOSIS — Z95828 Presence of other vascular implants and grafts: Secondary | ICD-10-CM | POA: Insufficient documentation

## 2020-08-11 LAB — CBC WITH DIFFERENTIAL (CANCER CENTER ONLY)
Abs Immature Granulocytes: 0.01 10*3/uL (ref 0.00–0.07)
Basophils Absolute: 0 10*3/uL (ref 0.0–0.1)
Basophils Relative: 1 %
Eosinophils Absolute: 0.1 10*3/uL (ref 0.0–0.5)
Eosinophils Relative: 2 %
HCT: 32.5 % — ABNORMAL LOW (ref 39.0–52.0)
Hemoglobin: 10.8 g/dL — ABNORMAL LOW (ref 13.0–17.0)
Immature Granulocytes: 0 %
Lymphocytes Relative: 16 %
Lymphs Abs: 0.7 10*3/uL (ref 0.7–4.0)
MCH: 30.2 pg (ref 26.0–34.0)
MCHC: 33.2 g/dL (ref 30.0–36.0)
MCV: 90.8 fL (ref 80.0–100.0)
Monocytes Absolute: 0.4 10*3/uL (ref 0.1–1.0)
Monocytes Relative: 8 %
Neutro Abs: 3.2 10*3/uL (ref 1.7–7.7)
Neutrophils Relative %: 73 %
Platelet Count: 53 10*3/uL — ABNORMAL LOW (ref 150–400)
RBC: 3.58 MIL/uL — ABNORMAL LOW (ref 4.22–5.81)
RDW: 13.2 % (ref 11.5–15.5)
WBC Count: 4.3 10*3/uL (ref 4.0–10.5)
nRBC: 0 % (ref 0.0–0.2)

## 2020-08-11 LAB — CMP (CANCER CENTER ONLY)
ALT: 60 U/L — ABNORMAL HIGH (ref 0–44)
AST: 25 U/L (ref 15–41)
Albumin: 3.4 g/dL — ABNORMAL LOW (ref 3.5–5.0)
Alkaline Phosphatase: 132 U/L — ABNORMAL HIGH (ref 38–126)
Anion gap: 7 (ref 5–15)
BUN: 10 mg/dL (ref 8–23)
CO2: 26 mmol/L (ref 22–32)
Calcium: 8.9 mg/dL (ref 8.9–10.3)
Chloride: 104 mmol/L (ref 98–111)
Creatinine: 0.91 mg/dL (ref 0.61–1.24)
GFR, Est AFR Am: >60 mL/min (ref >60)
GFR, Estimated: >60 mL/min (ref >60)
Glucose, Bld: 282 mg/dL — ABNORMAL HIGH (ref 70–99)
Potassium: 3.1 mmol/L — ABNORMAL LOW (ref 3.5–5.1)
Sodium: 137 mmol/L (ref 135–145)
Total Bilirubin: 1.3 mg/dL — ABNORMAL HIGH (ref 0.3–1.2)
Total Protein: 6.4 g/dL — ABNORMAL LOW (ref 6.5–8.1)

## 2020-08-11 LAB — MAGNESIUM: Magnesium: 1.5 mg/dL — ABNORMAL LOW (ref 1.7–2.4)

## 2020-08-11 MED ORDER — SODIUM CHLORIDE 0.9% FLUSH
10.0000 mL | INTRAVENOUS | Status: DC | PRN
  Filled 2020-08-11: qty 10

## 2020-08-11 MED ORDER — POTASSIUM CHLORIDE CRYS ER 20 MEQ PO TBCR: 20.0000 meq | EXTENDED_RELEASE_TABLET | Freq: Every day | ORAL | 0 refills | Status: DC

## 2020-08-11 MED ORDER — MAGNESIUM OXIDE 400 (241.3 MG) MG PO TABS: 400.0000 mg | ORAL_TABLET | Freq: Two times a day (BID) | ORAL | 2 refills | Status: DC

## 2020-08-11 MED ORDER — PANCRELIPASE (LIP-PROT-AMYL) 36000-114000 UNITS PO CPEP: ORAL_CAPSULE | ORAL | 5 refills | Status: DC

## 2020-08-11 NOTE — Telephone Encounter (Signed)
Called left message informing patient of lab results and new recommendation for magnesium supplement

## 2020-08-11 NOTE — Patient Instructions (Signed)

## 2020-08-11 NOTE — Telephone Encounter (Signed)
Scheduled appointment per 9/27 los. Spoke to patient's wife who is aware of appointment time and date.

## 2020-08-11 NOTE — Telephone Encounter (Signed)
-----   Message from Owens Shark, NP sent at 08/11/2020  1:13 PM EDT ----- Please let him know the magnesium level returned low.  I am sending a prescription to his pharmacy for magnesium oxide 400 mg twice daily.

## 2020-08-11 NOTE — Progress Notes (Signed)
   Covid-19 Vaccination Clinic  Name:  Rafeal Skibicki    MRN: [id number] DOB: [birth year]  08/11/2020  Mr. Camplin was observed post Covid-19 immunization for 15 minutes without incident. He was provided with Vaccine Information Sheet and instruction to access the V-Safe system.   Mr. Hanken was instructed to call 911 with any severe reactions post vaccine: Marland Kitchen Difficulty breathing  . Swelling of face and throat  . A fast heartbeat  . A bad rash all over body  . Dizziness and weakness

## 2020-08-11 NOTE — Progress Notes (Addendum)
  [place] OFFICE PROGRESS NOTE   Diagnosis: Pancreas cancer  INTERVAL HISTORY:   [name] returns as scheduled.  He completed cycle 1 FOLFOX 07/28/2020.  He denies nausea/vomiting.  He thinks he may have had a few small mouth sores.  Able to eat and drink without difficulty.  No change in baseline intermittent diarrhea following the chemotherapy.  He takes Imodium as needed.  He continues Creon. Cold sensitivity lasted about 4 days.  No persistent neuropathy symptoms.  He denies bleeding.  Objective:  Vital signs in last 24 hours:  Blood pressure (!) 141/67, pulse 80, temperature 97.7 F (36.5 C), temperature source Tympanic, resp. rate 19, height $RemoveBe'5\' 9"'[id number]$  (1.753 m), weight 159 lb 1.6 oz (72.2 kg), SpO2 100 %.    HEENT: No thrush or ulcers. Resp: Lungs clear bilaterally. Cardio: Regular rate and rhythm. GI: Abdomen soft and nontender.  No hepatomegaly. Vascular: No leg edema.  Skin: Palms without erythema. Port-A-Cath without erythema.   Lab Results:  Lab Results  Component Value Date   WBC 4.3 08/11/2020   HGB 10.8 (L) 08/11/2020   HCT 32.5 (L) 08/11/2020   MCV 90.8 08/11/2020   PLT 53 (L) 08/11/2020   NEUTROABS 3.2 08/11/2020    Imaging:  No results found.  Medications: I have reviewed the patient's current medications.  Assessment/Plan: 1. Pancreas cancer ? CT urology center 03/04/2020-haziness of the fat adjacent to the celiac axis and SMA with focal narrowing of the SMV, splenic vein, and splenoportal confluence ? 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 EUS 07/10/2020-no pancreas mass  identified, 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, 1.5 cm aortocaval node biopsy-scant lymphoid material, no malignancy ? Diagnostic laparoscopy 07/17/2020-segment 3 and 4 liver nodules, adenocarcinoma, positive for pankeratin, CK7 and CK20, partially positive for TTF-1, negative for CDX2 ? Foundation 1-microsatellite stable, tumor mutation burden 4, K-ras G12D, subclonal RB1 alteration ? Elevated CA 19-9 ? Cycle 1 FOLFOX 07/28/2020  2. Chronic thrombocytopenia 3. Diabetes 4. Hypertension 5. Abdomen/back pain secondary to #1  Disposition: Aaron Johnston appears stable.  He has completed 1 cycle of FOLFOX.  Overall he tolerated well.  Review of the CBC from today shows progression of chronic thrombocytopenia.  We are holding today's treatment and rescheduling for 1 week.  Chemotherapy will be further dose reduced with cycle 2, plan to add irinotecan.  He understands to contact the office with bleeding.  He has hypokalemia.  He will begin K-Dur 20 mEq daily.  We are checking a magnesium level as well.  He will return for lab, follow-up, FOLFIRINOX in 1 week.  He will contact the office in the interim as outlined above or with any other problems.  Covid booster today.  Patient seen with Dr. Benay Spice.    [name] ANP/GNP-BC   08/11/2020  10:20 AM This was a shared visit with [name].  Mr. Dettmann tolerated the first cycle of FOLFOX well, but he has developed progressive thrombocytopenia.  Chemotherapy will be held today.  The oxaliplatin will be further dose reduced with the next cycle of chemotherapy.  Irinotecan will also be dose reduced.  [name], MD

## 2020-08-13 ENCOUNTER — Inpatient Hospital Stay (HOSPITAL_BASED_OUTPATIENT_CLINIC_OR_DEPARTMENT_OTHER): Payer: Medicare Other | Admitting: Genetic Counselor

## 2020-08-13 ENCOUNTER — Inpatient Hospital Stay: Payer: Medicare Other

## 2020-08-13 ENCOUNTER — Other Ambulatory Visit: Payer: Self-pay | Admitting: Genetic Counselor

## 2020-08-13 ENCOUNTER — Encounter: Payer: Self-pay | Admitting: Genetic Counselor

## 2020-08-13 ENCOUNTER — Other Ambulatory Visit: Payer: Self-pay

## 2020-08-13 DIAGNOSIS — Z1379 Encounter for other screening for genetic and chromosomal anomalies: Secondary | ICD-10-CM | POA: Diagnosis not present

## 2020-08-13 DIAGNOSIS — Z803 Family history of malignant neoplasm of breast: Secondary | ICD-10-CM | POA: Diagnosis not present

## 2020-08-13 DIAGNOSIS — Z800 Family history of malignant neoplasm of digestive organs: Secondary | ICD-10-CM | POA: Insufficient documentation

## 2020-08-13 DIAGNOSIS — C250 Malignant neoplasm of head of pancreas: Secondary | ICD-10-CM

## 2020-08-13 NOTE — Progress Notes (Signed)
REFERRING PROVIDER: Ladell Pier, [place] [address] [place],  [place] [address]  PRIMARY PROVIDER:  [name], Carlean Jews.Marlou Sa, MD  PRIMARY REASON FOR VISIT:  1. 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 Family history of breast cancer   3. Family history of pancreatic cancer   4. Family history of colon cancer   5. Family history of stomach cancer      HISTORY OF PRESENT ILLNESS:   [name], a 74 y.o. male, was seen for a [facility] cancer genetics consultation at the request of Dr. Benay Spice due to a personal and family history of cancer.  Aaron Johnston presents to clinic today to discuss the possibility of a hereditary predisposition to cancer, genetic testing, and to further clarify his future cancer risks, as well as potential cancer risks for family members.   In September 2021, at the age of 32, [name] was diagnosed with cancer of the head of the pancreas. The treatment plan includes chemotherapy and possible surgery.     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5 % of original dose 85 mg/m2), Intravenous,  Once, 1 of 4 cycles Dose modification: 65 mg/m2 (original dose 85 mg/m2, Cycle 1, Reason: Provider Judgment), 50 mg/m2 (original dose 85 mg/m2, Cycle 2, Reason: Provider Judgment) Administration: 120 mg (07/28/2020) fosaprepitant (EMEND) 150 mg in sodium  chloride 0.9 % 145 mL IVPB, 150 mg, Intravenous,  Once, 0 of 3 cycles fluorouracil (ADRUCIL) 4,500 mg in sodium chloride 0.9 % 60 mL chemo infusion, 2,400 mg/m2 = 4,500 mg, Intravenous, 1 Day/Dose, 1 of 4 cycles Dose modification: 2,000 mg/m2 (original dose 2,400 mg/m2, Cycle 2, Reason: Provider Judgment) Administration: 4,500 mg (07/28/2020) leucovorin 748 mg in sodium chloride 0.9 % 250 mL infusion, 400 mg/m2 = 748 mg, Intravenous,  Once, 1 of 4 cycles Dose modification: 300 mg/m2 (original dose 400 mg/m2, Cycle 2, Reason: Provider Judgment) Administration: 748 mg (07/28/2020)  for chemotherapy treatment.        Past Medical History:  Diagnosis Date  . Diabetes mellitus without complication ([place])   . Family history of breast cancer   . Family history of colon cancer   . Family history of pancreatic cancer   . Family history of stomach cancer     No past surgical history on file.  Social History   Socioeconomic History  . Marital status: Married    Spouse name: Not on file  . Number of children: Not on file  . Years of education: Not on file  . Highest education level: Not on file  Occupational History  . Not on file  Tobacco Use  . Smoking status: Never Smoker  Substance and Sexual Activity  . Alcohol use: Yes  . Drug use: Never  . Sexual activity: Not on file  Other Topics Concern  . Not on file  Social History Narrative  . Not on file   Social Determinants of Health   Financial Resource Strain:   . Difficulty of Paying Living Expenses: Not on file  Food Insecurity:   . Worried About Charity fundraiser in the Last Year: Not on file  . Ran Out of Food in the Last Year: Not on file  Transportation Needs:   . Lack of Transportation (Medical): Not on file  . Lack of Transportation (Non-Medical): Not on file  Physical Activity:   . Days of Exercise per Week: Not on file  . Minutes of Exercise per Session: Not on file  Stress:   . Feeling of Stress : Not on file   Social Connections:   . Frequency of Communication with Friends and Family: Not on file  . Frequency of Social Gatherings with Friends and Family: Not on file  . Attends Religious Services: Not on file  . Active Member of Clubs or Organizations: Not on file  . Attends Archivist Meetings: Not on file  . Marital Status: Not on file     FAMILY HISTORY:  We obtained a detailed, 4-generation family history.  Significant diagnoses are listed below: Family History  Problem Relation Age of Onset  . Diabetes Mother   . Pancreatic cancer Father        d. 18  . Breast cancer Sister 20  . Diabetes Sister   . Diabetes Brother   . Stomach cancer Paternal Aunt        2 pat aunts with stomach cancer  . Colon cancer Paternal Uncle   . Cancer Paternal Grandfather        NOS  . Diabetes Sister   . Diabetes Brother   . Brain cancer Paternal Aunt   . Cancer Paternal Aunt        eye  . Diabetes Son     The patient has two sons who are cancer free.  He has two brothers and three sisters.  One sister had breast cancer at 68.  Both parents are deceased.  The patients father had pancreatic cancer and died at 66.  He had five sisters and four brothers.  One brother had colon cancer, two sisters had stomach cancer, a sister had brain cancer and a sister had eye cancer.  The paternal grandfather had a non-specific cancer.  The patient's mother died from complications of diabetes.  There is no additional information on this side of the family.  Mr. Adell is unaware of previous family history of genetic testing for hereditary cancer risks. Patient's maternal ancestors are of African American descent, and paternal ancestors are of African American descent. There is no reported Ashkenazi Jewish ancestry. There is no known consanguinity.  GENETIC COUNSELING ASSESSMENT: Aaron Johnston is a 74 y.o. male with a personal and family history of cancer which is somewhat suggestive of a hereditary cancer  syndrome and predisposition to cancer given the combination of cancer and the personal and family history of pancreatic cancer. We, therefore, discussed and recommended the following at today's visit.   DISCUSSION: We discussed that 15 - 17% of pancreatic cancer is hereditary, with most cases associated with BRCA mutations.  There are other genes that can be associated with hereditary pancreatic cancer syndromes.  These include the Lynch syndrome genes and others.  We discussed that testing is beneficial for several reasons including knowing how to follow individuals after completing their treatment, identifying whether potential treatment options such as PARP inhibitors  would be beneficial, and understand if other family members could be at risk for cancer and allow them to undergo genetic testing.   We reviewed the characteristics, features and inheritance patterns of hereditary cancer syndromes. We also discussed genetic testing, including the appropriate family members to test, the process of testing, insurance coverage and turn-around-time for results. We discussed the implications of a negative, positive, carrier and/or variant of uncertain significant result. We recommended Mr. Ancheta pursue genetic testing for the multicenter gene panel. The Multi-Gene Panel offered by [facility] includes sequencing and/or deletion duplication testing of the following 85 genes: AIP, ALK, APC, ATM, AXIN2,BAP1,  BARD1, BLM, BMPR1A, BRCA1, BRCA2, BRIP1, CASR, CDC73, CDH1, CDK4, CDKN1B, CDKN1C, CDKN2A (p14ARF), CDKN2A (p16INK4a), CEBPA, CHEK2, CTNNA1, DICER1, DIS3L2, EGFR (c.2369C>T, p.Thr790Met variant only), EPCAM (Deletion/duplication testing only), FH, FLCN, GATA2, GPC3, GREM1 (Promoter region deletion/duplication testing only), HOXB13 (c.251G>A, p.Gly84Glu), HRAS, KIT, MAX, MEN1, MET, MITF (c.952G>A, p.Glu318Lys variant only), MLH1, MSH2, MSH3, MSH6, MUTYH, NBN, NF1, NF2, NTHL1, PALB2, PDGFRA, PHOX2B, PMS2, POLD1, POLE,  POT1, PRKAR1A, PTCH1, PTEN, RAD50, RAD51C, RAD51D, RB1, RECQL4, RET, RNF43, RUNX1, SDHAF2, SDHA (sequence changes only), SDHB, SDHC, SDHD, SMAD4, SMARCA4, SMARCB1, SMARCE1, STK11, SUFU, TERC, TERT, TMEM127, TP53, TSC1, TSC2, VHL, WRN and WT1.    Based on Mr. Koeppen personal and family history of cancer, he meets medical criteria for genetic testing. Despite that he meets criteria, he may still have an out of pocket cost. We reviewed the DETECT program through [facility] and performing pancreatic cancer testing.  This program will cover the cost of testing in return for de-identified information on the patient.  He decided that he would like this testing through the DETECT program.    PLAN: After considering the risks, benefits, and limitations, Mr. Winegar provided informed consent to pursue genetic testing and the blood sample was sent to [facility] for analysis of the Multi cancer panel. Results should be available within approximately 2-3 weeks' time, at which point they will be disclosed by telephone to Mr. Dahm, as will any additional recommendations warranted by these results. Mr. Bir will receive a summary of his genetic counseling visit and a copy of his results once available. This information will also be available in Epic.   Lastly, we encouraged Mr. Teters to remain in contact with cancer genetics annually so that we can continuously update the family history and inform him of any changes in cancer genetics and testing that may be of benefit for this family.   Mr. Roper questions were answered to his satisfaction today. Our contact information was provided should additional questions or concerns arise. Thank you for the referral and allowing [place] to share in the care of your patient.   Schawn Byas P. Florene Glen, [place], [facility] Licensed, Insurance risk surveyor [email or web address] phone: [phone]  The patient was seen for a total of 45 minutes in face-to-face genetic  counseling.  This patient was discussed with Drs. Magrinat, Lindi Adie and/or Burr Medico who agrees with the above.    _______________________________________________________________________ For Office Staff:  Number of people involved in session: 2 Was an Intern/ student involved with case: no

## 2020-08-15 MED FILL — Fosaprepitant Dimeglumine For IV Infusion 150 MG (Base Eq): INTRAVENOUS | Qty: 5 | Status: AC

## 2020-08-18 ENCOUNTER — Inpatient Hospital Stay: Payer: Medicare Other

## 2020-08-18 ENCOUNTER — Inpatient Hospital Stay (HOSPITAL_BASED_OUTPATIENT_CLINIC_OR_DEPARTMENT_OTHER): Payer: Medicare Other | Admitting: Nurse Practitioner

## 2020-08-18 ENCOUNTER — Other Ambulatory Visit: Payer: Self-pay

## 2020-08-18 ENCOUNTER — Inpatient Hospital Stay: Payer: Medicare Other | Attending: Genetic Counselor

## 2020-08-18 ENCOUNTER — Encounter: Payer: Self-pay | Admitting: Nurse Practitioner

## 2020-08-18 VITALS — BP 127/78 | HR 84 | Temp 98.1°F | Resp 16 | Ht 69.0 in | Wt 158.8 lb

## 2020-08-18 DIAGNOSIS — G893 Neoplasm related pain (acute) (chronic): Secondary | ICD-10-CM | POA: Diagnosis not present

## 2020-08-18 DIAGNOSIS — I10 Essential (primary) hypertension: Secondary | ICD-10-CM | POA: Insufficient documentation

## 2020-08-18 DIAGNOSIS — Z5189 Encounter for other specified aftercare: Secondary | ICD-10-CM | POA: Insufficient documentation

## 2020-08-18 DIAGNOSIS — C250 Malignant neoplasm of head of pancreas: Secondary | ICD-10-CM

## 2020-08-18 DIAGNOSIS — E119 Type 2 diabetes mellitus without complications: Secondary | ICD-10-CM | POA: Diagnosis not present

## 2020-08-18 DIAGNOSIS — C259 Malignant neoplasm of pancreas, unspecified: Secondary | ICD-10-CM | POA: Diagnosis not present

## 2020-08-18 DIAGNOSIS — Z95828 Presence of other vascular implants and grafts: Secondary | ICD-10-CM

## 2020-08-18 DIAGNOSIS — D696 Thrombocytopenia, unspecified: Secondary | ICD-10-CM | POA: Diagnosis not present

## 2020-08-18 DIAGNOSIS — Z79899 Other long term (current) drug therapy: Secondary | ICD-10-CM | POA: Insufficient documentation

## 2020-08-18 DIAGNOSIS — Z5111 Encounter for antineoplastic chemotherapy: Secondary | ICD-10-CM | POA: Diagnosis not present

## 2020-08-18 DIAGNOSIS — Z23 Encounter for immunization: Secondary | ICD-10-CM | POA: Insufficient documentation

## 2020-08-18 LAB — CMP (CANCER CENTER ONLY)
ALT: 38 U/L (ref 0–44)
AST: 24 U/L (ref 15–41)
Albumin: 3.6 g/dL (ref 3.5–5.0)
Alkaline Phosphatase: 123 U/L (ref 38–126)
Anion gap: 9 (ref 5–15)
BUN: 9 mg/dL (ref 8–23)
CO2: 26 mmol/L (ref 22–32)
Calcium: 9.6 mg/dL (ref 8.9–10.3)
Chloride: 104 mmol/L (ref 98–111)
Creatinine: 0.94 mg/dL (ref 0.61–1.24)
GFR, Est AFR Am: >60 mL/min (ref >60)
GFR, Estimated: >60 mL/min (ref >60)
Glucose, Bld: 290 mg/dL — ABNORMAL HIGH (ref 70–99)
Potassium: 4.2 mmol/L (ref 3.5–5.1)
Sodium: 139 mmol/L (ref 135–145)
Total Bilirubin: 1 mg/dL (ref 0.3–1.2)
Total Protein: 6.7 g/dL (ref 6.5–8.1)

## 2020-08-18 LAB — CBC WITH DIFFERENTIAL (CANCER CENTER ONLY)
Abs Immature Granulocytes: 0.01 10*3/uL (ref 0.00–0.07)
Basophils Absolute: 0 10*3/uL (ref 0.0–0.1)
Basophils Relative: 1 %
Eosinophils Absolute: 0.1 10*3/uL (ref 0.0–0.5)
Eosinophils Relative: 3 %
HCT: 32.3 % — ABNORMAL LOW (ref 39.0–52.0)
Hemoglobin: 10.7 g/dL — ABNORMAL LOW (ref 13.0–17.0)
Immature Granulocytes: 0 %
Lymphocytes Relative: 26 %
Lymphs Abs: 0.7 10*3/uL (ref 0.7–4.0)
MCH: 30.5 pg (ref 26.0–34.0)
MCHC: 33.1 g/dL (ref 30.0–36.0)
MCV: 92 fL (ref 80.0–100.0)
Monocytes Absolute: 0.3 10*3/uL (ref 0.1–1.0)
Monocytes Relative: 9 %
Neutro Abs: 1.7 10*3/uL (ref 1.7–7.7)
Neutrophils Relative %: 61 %
Platelet Count: 70 10*3/uL — ABNORMAL LOW (ref 150–400)
RBC: 3.51 MIL/uL — ABNORMAL LOW (ref 4.22–5.81)
RDW: 13.6 % (ref 11.5–15.5)
WBC Count: 2.8 10*3/uL — ABNORMAL LOW (ref 4.0–10.5)
nRBC: 0 % (ref 0.0–0.2)

## 2020-08-18 LAB — MAGNESIUM: Magnesium: 1.6 mg/dL — ABNORMAL LOW (ref 1.7–2.4)

## 2020-08-18 LAB — GENETIC SCREENING ORDER

## 2020-08-18 MED ORDER — SODIUM CHLORIDE 0.9% FLUSH
10.0000 mL | INTRAVENOUS | Status: DC | PRN
  Administered 2020-08-18: 10 mL
  Filled 2020-08-18: qty 10

## 2020-08-18 MED ORDER — SODIUM CHLORIDE 0.9 % IV SOLN
2000.0000 mg/m2 | INTRAVENOUS | Status: DC
  Administered 2020-08-18: 3750 mg via INTRAVENOUS
  Filled 2020-08-18: qty 75

## 2020-08-18 MED ORDER — LEUCOVORIN CALCIUM INJECTION 350 MG
300.0000 mg/m2 | Freq: Once | INTRAVENOUS | Status: AC
  Administered 2020-08-18: 562 mg via INTRAVENOUS
  Filled 2020-08-18: qty 28.1

## 2020-08-18 MED ORDER — DEXTROSE 5 % IV SOLN
Freq: Once | INTRAVENOUS | Status: AC
  Filled 2020-08-18: qty 250

## 2020-08-18 MED ORDER — PALONOSETRON HCL INJECTION 0.25 MG/5ML
INTRAVENOUS | Status: AC
  Filled 2020-08-18: qty 5

## 2020-08-18 MED ORDER — LEUCOVORIN CALCIUM INJECTION 350 MG: 300.0000 mg/m2 | Freq: Once | INTRAVENOUS | Status: DC

## 2020-08-18 MED ORDER — OXALIPLATIN CHEMO INJECTION 100 MG/20ML
50.0000 mg/m2 | Freq: Once | INTRAVENOUS | Status: AC
  Administered 2020-08-18: 95 mg via INTRAVENOUS
  Filled 2020-08-18: qty 19

## 2020-08-18 MED ORDER — SODIUM CHLORIDE 0.9 % IV SOLN
300.0000 mg/m2 | Freq: Once | INTRAVENOUS | Status: DC
  Filled 2020-08-18: qty 28.1

## 2020-08-18 MED ORDER — SODIUM CHLORIDE 0.9 % IV SOLN
10.0000 mg | Freq: Once | INTRAVENOUS | Status: AC
  Administered 2020-08-18: 10 mg via INTRAVENOUS
  Filled 2020-08-18: qty 10

## 2020-08-18 MED ORDER — PALONOSETRON HCL INJECTION 0.25 MG/5ML
0.2500 mg | Freq: Once | INTRAVENOUS | Status: AC
  Administered 2020-08-18: 0.25 mg via INTRAVENOUS

## 2020-08-18 NOTE — Patient Instructions (Signed)
[facility] Discharge Instructions for Patients Receiving Chemotherapy  Today you received the following chemotherapy agents Oxaliplatin, Leucovorin and Adrucil   To help prevent nausea and vomiting after your treatment, we encourage you to take your nausea medication as directed.    If you develop nausea and vomiting that is not controlled by your nausea medication, call the clinic.   BELOW ARE SYMPTOMS THAT SHOULD BE REPORTED IMMEDIATELY:  *FEVER GREATER THAN 100.5 F  *CHILLS WITH OR WITHOUT FEVER  NAUSEA AND VOMITING THAT IS NOT CONTROLLED WITH YOUR NAUSEA MEDICATION  *UNUSUAL SHORTNESS OF BREATH  *UNUSUAL BRUISING OR BLEEDING  TENDERNESS IN MOUTH AND THROAT WITH OR WITHOUT PRESENCE OF ULCERS  *URINARY PROBLEMS  *BOWEL PROBLEMS  UNUSUAL RASH Items with * indicate a potential emergency and should be followed up as soon as possible.  Feel free to call the clinic should you have any questions or concerns. The clinic phone number is [phone].  Please show the CHEMO ALERT CARD at check-in to the Emergency Department and triage nurse.   

## 2020-08-18 NOTE — Progress Notes (Addendum)
[facility] OFFICE PROGRESS NOTE   Diagnosis: Pancreas cancer  INTERVAL HISTORY:   Aaron Johnston returns as scheduled.  He completed cycle 1 FOLFOX 07/28/2020.  Cycle 2 was held last week due to progressive thrombocytopenia.  He is seen today prior to proceeding with cycle 2.  He denies nausea/vomiting.  No change in baseline bowel habits which consist of loose stools.  He denies bleeding.  Overall he feels well.  Objective:  Vital signs in last 24 hours:  Blood pressure 127/78, pulse 84, temperature 98.1 F (36.7 C), temperature source Tympanic, resp. rate 16, height '5\' 9"'  (1.753 m), weight 158 lb 12.8 oz (72 kg), SpO2 100 %.    HEENT: No thrush or ulcers. Resp: Lungs clear bilaterally. Cardio: Regular rate and rhythm. GI: Abdomen soft and nontender.  No hepatomegaly. Vascular: No leg edema.  Skin: Palms without erythema. Port-A-Cath without erythema.   Lab Results:  Lab Results  Component Value Date   WBC 2.8 (L) 08/18/2020   HGB 10.7 (L) 08/18/2020   HCT 32.3 (L) 08/18/2020   MCV 92.0 08/18/2020   PLT 70 (L) 08/18/2020   NEUTROABS 1.7 08/18/2020    Imaging:  No results found.  Medications: I have reviewed the patient's current medications.  Assessment/Plan: 1. Pancreas cancer ? CT urology center 03/04/2020-haziness of the fat adjacent to the celiac axis and SMA with focal narrowing of the SMV, splenic vein, and splenoportal confluence ? 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 EUS 07/10/2020-no pancreas mass identified, 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, 1.5 cm aortocaval node  biopsy-scant lymphoid material, no malignancy ? Diagnostic laparoscopy 07/17/2020-segment 3 and 4 liver nodules,adenocarcinoma, positive for pankeratin, CK7 and CK20, partially positive for TTF-1, negative for CDX2 ? Foundation 1-microsatellite stable, tumor mutation burden 4, K-ras G12D, subclonal RB1 alteration ? Elevated CA 19-9 ? Cycle 1 FOLFOX 07/28/2020 ? Cycle 2 FOLFOX 08/18/2020, oxaliplatin dose reduced due to thrombocytopenia  2. Chronic thrombocytopenia 3. Diabetes 4. Hypertension 5. Abdomen/back pain secondary to #1  Disposition: Aaron Johnston appears stable.  He has completed 1 cycle of FOLFOX.  Cycle 2 was held last week due to progressive chronic thrombocytopenia.  Review of CBC from today shows improvement in the thrombocytopenia but the platelet count remains low and the white count is mildly decreased.  Initial plan was to add irinotecan today.  Due to the low platelet count plan to proceed with FOLFOX, oxaliplatin further reduced, no irinotecan.  He understands to contact the office with bleeding, fever, chills, other signs of infection.  He agrees to proceed.  He will return for lab, follow-up, treatment in 2 weeks.  He will contact the office in the interim as outlined above or with any other problems.  Patient seen with Dr. Benay Spice.   [name] ANP/GNP-BC   08/18/2020  9:27 AM  This was a shared visit with [name].  Aaron Johnston has an improved platelet count today.  The plan is to resume chemotherapy with further dose reduction of oxaliplatin.  We will consider adding irinotecan and G-CSF support with the next cycle of chemotherapy.  We also discussed the potential need to change chemotherapy to a 3-week schedule.  [name], MD

## 2020-08-18 NOTE — Addendum Note (Signed)
Addended by: Tora Kindred on: 08/18/2020 11:04 AM   Modules accepted: Orders

## 2020-08-19 ENCOUNTER — Other Ambulatory Visit: Payer: Medicare Other

## 2020-08-19 ENCOUNTER — Ambulatory Visit: Payer: Medicare Other | Admitting: Nurse Practitioner

## 2020-08-19 ENCOUNTER — Telehealth: Payer: Self-pay | Admitting: Nurse Practitioner

## 2020-08-19 NOTE — Telephone Encounter (Signed)
Scheduled appointments per 10/4 los. Spoke to patient's wife who is aware of appointments dates and times.

## 2020-08-20 ENCOUNTER — Inpatient Hospital Stay: Payer: Medicare Other

## 2020-08-20 ENCOUNTER — Other Ambulatory Visit: Payer: Self-pay

## 2020-08-20 DIAGNOSIS — C259 Malignant neoplasm of pancreas, unspecified: Secondary | ICD-10-CM | POA: Diagnosis not present

## 2020-08-20 DIAGNOSIS — Z95828 Presence of other vascular implants and grafts: Secondary | ICD-10-CM

## 2020-08-20 DIAGNOSIS — C250 Malignant neoplasm of head of pancreas: Secondary | ICD-10-CM

## 2020-08-20 MED ORDER — HEPARIN SOD (PORK) LOCK FLUSH 100 UNIT/ML IV SOLN
500.0000 [IU] | Freq: Once | INTRAVENOUS | Status: AC | PRN
  Administered 2020-08-20: 500 [IU]
  Filled 2020-08-20: qty 5

## 2020-08-20 MED ORDER — SODIUM CHLORIDE 0.9% FLUSH
10.0000 mL | INTRAVENOUS | Status: DC | PRN
  Filled 2020-08-20: qty 10

## 2020-08-20 NOTE — Patient Instructions (Signed)

## 2020-08-28 ENCOUNTER — Encounter: Payer: Self-pay | Admitting: Genetic Counselor

## 2020-08-28 ENCOUNTER — Telehealth: Payer: Self-pay | Admitting: Genetic Counselor

## 2020-08-28 DIAGNOSIS — Z1379 Encounter for other screening for genetic and chromosomal anomalies: Secondary | ICD-10-CM | POA: Insufficient documentation

## 2020-08-28 NOTE — Telephone Encounter (Signed)
LM on VM that results are back and to please call.  Left CB instructions. 

## 2020-08-29 NOTE — Telephone Encounter (Signed)
Spoke with the patient's wife.  I caught them at a bad time and they will call back when they are available to talk.

## 2020-08-30 ENCOUNTER — Other Ambulatory Visit: Payer: Self-pay | Admitting: Oncology

## 2020-09-01 ENCOUNTER — Inpatient Hospital Stay: Payer: Medicare Other

## 2020-09-01 ENCOUNTER — Encounter: Payer: Self-pay | Admitting: Nurse Practitioner

## 2020-09-01 ENCOUNTER — Telehealth: Payer: Self-pay | Admitting: Nurse Practitioner

## 2020-09-01 ENCOUNTER — Inpatient Hospital Stay (HOSPITAL_BASED_OUTPATIENT_CLINIC_OR_DEPARTMENT_OTHER): Payer: Medicare Other | Admitting: Nurse Practitioner

## 2020-09-01 ENCOUNTER — Other Ambulatory Visit: Payer: Self-pay

## 2020-09-01 VITALS — BP 140/80 | HR 76 | Temp 97.8°F | Resp 18 | Ht 69.0 in | Wt 156.8 lb

## 2020-09-01 DIAGNOSIS — C250 Malignant neoplasm of head of pancreas: Secondary | ICD-10-CM

## 2020-09-01 DIAGNOSIS — C259 Malignant neoplasm of pancreas, unspecified: Secondary | ICD-10-CM

## 2020-09-01 DIAGNOSIS — Z95828 Presence of other vascular implants and grafts: Secondary | ICD-10-CM

## 2020-09-01 LAB — CMP (CANCER CENTER ONLY)
ALT: 38 U/L (ref 0–44)
AST: 24 U/L (ref 15–41)
Albumin: 3.4 g/dL — ABNORMAL LOW (ref 3.5–5.0)
Alkaline Phosphatase: 113 U/L (ref 38–126)
Anion gap: 4 — ABNORMAL LOW (ref 5–15)
BUN: 16 mg/dL (ref 8–23)
CO2: 24 mmol/L (ref 22–32)
Calcium: 9 mg/dL (ref 8.9–10.3)
Chloride: 109 mmol/L (ref 98–111)
Creatinine: 0.99 mg/dL (ref 0.61–1.24)
GFR, Estimated: >60 mL/min (ref >60)
Glucose, Bld: 234 mg/dL — ABNORMAL HIGH (ref 70–99)
Potassium: 3.7 mmol/L (ref 3.5–5.1)
Sodium: 137 mmol/L (ref 135–145)
Total Bilirubin: 1.2 mg/dL (ref 0.3–1.2)
Total Protein: 6.2 g/dL — ABNORMAL LOW (ref 6.5–8.1)

## 2020-09-01 LAB — CBC WITH DIFFERENTIAL (CANCER CENTER ONLY)
Abs Immature Granulocytes: 0.01 10*3/uL (ref 0.00–0.07)
Basophils Absolute: 0 10*3/uL (ref 0.0–0.1)
Basophils Relative: 0 %
Eosinophils Absolute: 0.1 10*3/uL (ref 0.0–0.5)
Eosinophils Relative: 2 %
HCT: 28.5 % — ABNORMAL LOW (ref 39.0–52.0)
Hemoglobin: 9.5 g/dL — ABNORMAL LOW (ref 13.0–17.0)
Immature Granulocytes: 0 %
Lymphocytes Relative: 24 %
Lymphs Abs: 0.6 10*3/uL — ABNORMAL LOW (ref 0.7–4.0)
MCH: 30.4 pg (ref 26.0–34.0)
MCHC: 33.3 g/dL (ref 30.0–36.0)
MCV: 91.1 fL (ref 80.0–100.0)
Monocytes Absolute: 0.3 10*3/uL (ref 0.1–1.0)
Monocytes Relative: 10 %
Neutro Abs: 1.6 10*3/uL — ABNORMAL LOW (ref 1.7–7.7)
Neutrophils Relative %: 64 %
Platelet Count: 40 10*3/uL — ABNORMAL LOW (ref 150–400)
RBC: 3.13 MIL/uL — ABNORMAL LOW (ref 4.22–5.81)
RDW: 14 % (ref 11.5–15.5)
WBC Count: 2.5 10*3/uL — ABNORMAL LOW (ref 4.0–10.5)
nRBC: 0 % (ref 0.0–0.2)

## 2020-09-01 MED ORDER — SODIUM CHLORIDE 0.9% FLUSH
10.0000 mL | INTRAVENOUS | Status: DC | PRN
  Administered 2020-09-01: 10 mL
  Filled 2020-09-01: qty 10

## 2020-09-01 MED ORDER — HEPARIN SOD (PORK) LOCK FLUSH 100 UNIT/ML IV SOLN
500.0000 [IU] | Freq: Once | INTRAVENOUS | Status: AC | PRN
  Administered 2020-09-01: 500 [IU]
  Filled 2020-09-01: qty 5

## 2020-09-01 NOTE — Progress Notes (Addendum)
[facility] OFFICE PROGRESS NOTE   Diagnosis:  Pancreas cancer  INTERVAL HISTORY:   [name] returns as scheduled.  He completed cycle 2 FOLFOX 08/18/2020.  Oxaliplatin was dose reduced due to thrombocytopenia.  He denies bleeding.  No nausea or vomiting.  No mouth sores.  No change in baseline bowel habits.  He has intermittent diarrhea which he thinks may be related to certain foods.  No cold sensitivity.  No fever.  Objective:  Vital signs in last 24 hours:  Blood pressure 140/80, pulse 76, temperature 97.8 F (36.6 C), temperature source Tympanic, resp. rate 18, height _0  (1.753 m), weight 156 lb 12.8 oz (71.1 kg), SpO2 100 %.    HEENT: No thrush or ulcers. Resp: Lungs clear bilaterally. Cardio: Regular rate and rhythm. GI: Abdomen soft and nontender.  No hepatomegaly. Vascular: No leg edema.  Skin: Palms without erythema. Port-A-Cath without erythema.   Lab Results:  Lab Results  Component Value Date   WBC 2.5 (L) 09/01/2020   HGB 9.5 (L) 09/01/2020   HCT 28.5 (L) 09/01/2020   MCV 91.1 09/01/2020   PLT 40 (L) 09/01/2020   NEUTROABS 1.6 (L) 09/01/2020    Imaging:  No results found.  Medications: I have reviewed the patient's current medications.  Assessment/Plan: 1. Pancreas cancer ? CT urology center 03/04/2020-haziness of the fat adjacent to the celiac axis and SMA with focal narrowing of the SMV, splenic vein, and splenoportal confluence ? 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 EUS 07/10/2020-no pancreas mass identified, 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, 1.5 cm  aortocaval node biopsy-scant lymphoid material, no malignancy ? Diagnostic laparoscopy 07/17/2020-segment 3 and 4 liver nodules,adenocarcinoma, positive for pankeratin, CK7 and CK20, partially positive for TTF-1, negative for CDX2 ? Foundation 1-microsatellite stable, tumor mutation burden 4, K-ras G12D, subclonal RB1 alteration ? Elevated CA 19-9 ? Cycle 1 FOLFOX 07/28/2020 ? Cycle 2 FOLFOX 08/18/2020, oxaliplatin dose reduced due to thrombocytopenia  2. Chronic thrombocytopenia 3. Diabetes 4. Hypertension 5. Abdomen/back pain secondary to #1  Disposition: Mr. Eckels appears stable.  He has completed 2 cycles of FOLFOX.  Oxaliplatin was dose reduced with with cycle 2 due to thrombocytopenia.  He has progressive thrombocytopenia on labs today.  We are holding today's chemotherapy and rescheduling for 1 week.  Thereafter the treatment schedule will be adjusted from every 2 weeks to every 3 weeks.  He understands to contact the office with bleeding. We also discussed the mild neutropenia.  He understands to contact the office with fever, chills, other signs of infection.  He will return for lab and cycle 3 FOLFOX in 1 week.  Lab, follow-up, cycle 4 FOLFOX on 09/29/2020.  He will contact the office in the interim as outlined above or with any other problems.  Patient seen with Dr. Benay Spice.    [name] ANP/GNP-BC   09/01/2020  11:22 AM This was a shared visit with [name].  Mr. Cybulski is tolerating the chemotherapy well, but he has persistent thrombocytopenia.  We discussed treatment options with Mr. Stiehl and his wife.  We decided to change to a 3-week chemotherapy schedule.  We will consider substituting irinotecan for oxaliplatin if the thrombocytopenia persists.  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  However it could be difficult for him to tolerate prednisone in the setting of diabetes.  [name], MD

## 2020-09-01 NOTE — Patient Instructions (Signed)

## 2020-09-01 NOTE — Telephone Encounter (Signed)
Scheduled per 10/18 los. Pt is aware of appt times and dates.

## 2020-09-02 ENCOUNTER — Telehealth: Payer: Self-pay | Admitting: Genetic Counselor

## 2020-09-02 ENCOUNTER — Other Ambulatory Visit: Payer: Self-pay | Admitting: Nurse Practitioner

## 2020-09-02 DIAGNOSIS — C250 Malignant neoplasm of head of pancreas: Secondary | ICD-10-CM

## 2020-09-02 NOTE — Telephone Encounter (Signed)
LM on VM that results are back and to please call. 

## 2020-09-03 ENCOUNTER — Inpatient Hospital Stay: Payer: Medicare Other

## 2020-09-04 NOTE — Telephone Encounter (Signed)
Spoke with wife.  She is on a conference call at the moment and her husband is not available to discuss results.  She will CB later today.

## 2020-09-05 NOTE — Telephone Encounter (Signed)
Wife called and LM.  I called back and left VM for patient.  Left CB instructions on where to call on Monday and Tuesday of next week as I will be out of the office.

## 2020-09-08 ENCOUNTER — Inpatient Hospital Stay: Payer: Medicare Other

## 2020-09-08 ENCOUNTER — Other Ambulatory Visit: Payer: Self-pay | Admitting: Nurse Practitioner

## 2020-09-08 ENCOUNTER — Other Ambulatory Visit: Payer: Self-pay

## 2020-09-08 VITALS — BP 114/54 | HR 72 | Temp 98.4°F | Resp 18

## 2020-09-08 DIAGNOSIS — Z95828 Presence of other vascular implants and grafts: Secondary | ICD-10-CM

## 2020-09-08 DIAGNOSIS — C250 Malignant neoplasm of head of pancreas: Secondary | ICD-10-CM

## 2020-09-08 DIAGNOSIS — C259 Malignant neoplasm of pancreas, unspecified: Secondary | ICD-10-CM | POA: Diagnosis not present

## 2020-09-08 LAB — CBC WITH DIFFERENTIAL (CANCER CENTER ONLY)
Abs Immature Granulocytes: 0 10*3/uL (ref 0.00–0.07)
Basophils Absolute: 0 10*3/uL (ref 0.0–0.1)
Basophils Relative: 0 %
Eosinophils Absolute: 0.1 10*3/uL (ref 0.0–0.5)
Eosinophils Relative: 2 %
HCT: 30.6 % — ABNORMAL LOW (ref 39.0–52.0)
Hemoglobin: 9.9 g/dL — ABNORMAL LOW (ref 13.0–17.0)
Immature Granulocytes: 0 %
Lymphocytes Relative: 24 %
Lymphs Abs: 0.6 10*3/uL — ABNORMAL LOW (ref 0.7–4.0)
MCH: 30.4 pg (ref 26.0–34.0)
MCHC: 32.4 g/dL (ref 30.0–36.0)
MCV: 93.9 fL (ref 80.0–100.0)
Monocytes Absolute: 0.3 10*3/uL (ref 0.1–1.0)
Monocytes Relative: 14 %
Neutro Abs: 1.4 10*3/uL — ABNORMAL LOW (ref 1.7–7.7)
Neutrophils Relative %: 60 %
Platelet Count: 71 10*3/uL — ABNORMAL LOW (ref 150–400)
RBC: 3.26 MIL/uL — ABNORMAL LOW (ref 4.22–5.81)
RDW: 14.6 % (ref 11.5–15.5)
WBC Count: 2.3 10*3/uL — ABNORMAL LOW (ref 4.0–10.5)
nRBC: 0 % (ref 0.0–0.2)

## 2020-09-08 LAB — CMP (CANCER CENTER ONLY)
ALT: 29 U/L (ref 0–44)
AST: 24 U/L (ref 15–41)
Albumin: 3.5 g/dL (ref 3.5–5.0)
Alkaline Phosphatase: 110 U/L (ref 38–126)
Anion gap: 5 (ref 5–15)
BUN: 11 mg/dL (ref 8–23)
CO2: 26 mmol/L (ref 22–32)
Calcium: 9.1 mg/dL (ref 8.9–10.3)
Chloride: 105 mmol/L (ref 98–111)
Creatinine: 0.88 mg/dL (ref 0.61–1.24)
GFR, Estimated: >60 mL/min (ref >60)
Glucose, Bld: 272 mg/dL — ABNORMAL HIGH (ref 70–99)
Potassium: 4.3 mmol/L (ref 3.5–5.1)
Sodium: 136 mmol/L (ref 135–145)
Total Bilirubin: 1 mg/dL (ref 0.3–1.2)
Total Protein: 6.2 g/dL — ABNORMAL LOW (ref 6.5–8.1)

## 2020-09-08 MED ORDER — SODIUM CHLORIDE 0.9% FLUSH
10.0000 mL | INTRAVENOUS | Status: DC | PRN
  Administered 2020-09-08: 10 mL
  Filled 2020-09-08: qty 10

## 2020-09-08 MED ORDER — SODIUM CHLORIDE 0.9 % IV SOLN
10.0000 mg | Freq: Once | INTRAVENOUS | Status: AC
  Administered 2020-09-08: 10 mg via INTRAVENOUS
  Filled 2020-09-08: qty 10

## 2020-09-08 MED ORDER — SODIUM CHLORIDE 0.9 % IV SOLN
2000.0000 mg/m2 | INTRAVENOUS | Status: DC
  Administered 2020-09-08: 3750 mg via INTRAVENOUS
  Filled 2020-09-08: qty 75

## 2020-09-08 MED ORDER — LEUCOVORIN CALCIUM INJECTION 350 MG
300.0000 mg/m2 | Freq: Once | INTRAVENOUS | Status: AC
  Administered 2020-09-08: 562 mg via INTRAVENOUS
  Filled 2020-09-08: qty 28.1

## 2020-09-08 MED ORDER — PALONOSETRON HCL INJECTION 0.25 MG/5ML
0.2500 mg | Freq: Once | INTRAVENOUS | Status: AC
  Administered 2020-09-08: 0.25 mg via INTRAVENOUS

## 2020-09-08 MED ORDER — OXALIPLATIN CHEMO INJECTION 100 MG/20ML
50.0000 mg/m2 | Freq: Once | INTRAVENOUS | Status: AC
  Administered 2020-09-08: 95 mg via INTRAVENOUS
  Filled 2020-09-08: qty 19

## 2020-09-08 MED ORDER — PALONOSETRON HCL INJECTION 0.25 MG/5ML
INTRAVENOUS | Status: AC
  Filled 2020-09-08: qty 5

## 2020-09-08 NOTE — Patient Instructions (Signed)

## 2020-09-08 NOTE — Progress Notes (Signed)
ANC 1.4, ok to proceed with treatment

## 2020-09-08 NOTE — Progress Notes (Signed)
Per Dr. Benay Spice: OK to treat w/70,000 platelets and ANC 1.4 today. Have changed his tx to every 3 weeks and dose reduced.

## 2020-09-08 NOTE — Patient Instructions (Signed)
[facility] Discharge Instructions for Patients Receiving Chemotherapy  Today you received the following chemotherapy agents Oxaliplatin, Leucovorin, 5FU  To help prevent nausea and vomiting after your treatment, we encourage you to take your nausea medication as directed   If you develop nausea and vomiting that is not controlled by your nausea medication, call the clinic.   BELOW ARE SYMPTOMS THAT SHOULD BE REPORTED IMMEDIATELY:  *FEVER GREATER THAN 100.5 F  *CHILLS WITH OR WITHOUT FEVER  NAUSEA AND VOMITING THAT IS NOT CONTROLLED WITH YOUR NAUSEA MEDICATION  *UNUSUAL SHORTNESS OF BREATH  *UNUSUAL BRUISING OR BLEEDING  TENDERNESS IN MOUTH AND THROAT WITH OR WITHOUT PRESENCE OF ULCERS  *URINARY PROBLEMS  *BOWEL PROBLEMS  UNUSUAL RASH Items with * indicate a potential emergency and should be followed up as soon as possible.  Feel free to call the clinic should you have any questions or concerns. The clinic phone number is [phone].  Please show the CHEMO ALERT CARD at check-in to the Emergency Department and triage nurse.   

## 2020-09-09 LAB — CANCER ANTIGEN 19-9: CA 19-9: 2822 U/mL — ABNORMAL HIGH (ref 0–35)

## 2020-09-10 ENCOUNTER — Inpatient Hospital Stay: Payer: Medicare Other

## 2020-09-10 ENCOUNTER — Telehealth: Payer: Self-pay | Admitting: *Deleted

## 2020-09-10 ENCOUNTER — Other Ambulatory Visit: Payer: Self-pay

## 2020-09-10 VITALS — BP 121/55 | HR 78 | Temp 98.5°F | Resp 18

## 2020-09-10 DIAGNOSIS — C259 Malignant neoplasm of pancreas, unspecified: Secondary | ICD-10-CM | POA: Diagnosis not present

## 2020-09-10 DIAGNOSIS — C250 Malignant neoplasm of head of pancreas: Secondary | ICD-10-CM

## 2020-09-10 MED ORDER — PEGFILGRASTIM-CBQV 6 MG/0.6ML ~~LOC~~ SOSY
PREFILLED_SYRINGE | SUBCUTANEOUS | Status: AC
  Filled 2020-09-10: qty 0.6

## 2020-09-10 MED ORDER — PEGFILGRASTIM-CBQV 6 MG/0.6ML ~~LOC~~ SOSY
6.0000 mg | PREFILLED_SYRINGE | Freq: Once | SUBCUTANEOUS | Status: AC
  Administered 2020-09-10: 6 mg via SUBCUTANEOUS

## 2020-09-10 MED ORDER — INFLUENZA VAC A&B SA ADJ QUAD 0.5 ML IM PRSY
PREFILLED_SYRINGE | INTRAMUSCULAR | Status: AC
  Filled 2020-09-10: qty 0.5

## 2020-09-10 MED ORDER — HEPARIN SOD (PORK) LOCK FLUSH 100 UNIT/ML IV SOLN
500.0000 [IU] | Freq: Once | INTRAVENOUS | Status: AC | PRN
  Administered 2020-09-10: 500 [IU]
  Filled 2020-09-10: qty 5

## 2020-09-10 MED ORDER — INFLUENZA VAC A&B SA ADJ QUAD 0.5 ML IM PRSY
0.5000 mL | PREFILLED_SYRINGE | Freq: Once | INTRAMUSCULAR | Status: AC
  Administered 2020-09-10: 0.5 mL via INTRAMUSCULAR

## 2020-09-10 MED ORDER — SODIUM CHLORIDE 0.9% FLUSH
10.0000 mL | INTRAVENOUS | Status: DC | PRN
  Administered 2020-09-10: 10 mL
  Filled 2020-09-10: qty 10

## 2020-09-10 NOTE — Patient Instructions (Addendum)
Pegfilgrastim injection What is this medicine? PEGFILGRASTIM (PEG fil gra stim) is a long-acting granulocyte colony-stimulating factor that stimulates the growth of neutrophils, a type of white blood cell important in the body's fight against infection. It is used to reduce the incidence of fever and infection in patients with certain types of cancer who are receiving chemotherapy that affects the bone marrow, and to increase survival after being exposed to high doses of radiation. This medicine may be used for other purposes; ask your health care provider or pharmacist if you have questions. COMMON BRAND NAME(S): Fulphila, Neulasta, UDENYCA, Ziextenzo What should I tell my health care provider before I take this medicine? They need to know if you have any of these conditions:  kidney disease  latex allergy  ongoing radiation therapy  sickle cell disease  skin reactions to acrylic adhesives (On-Body Injector only)  an unusual or allergic reaction to pegfilgrastim, filgrastim, other medicines, foods, dyes, or preservatives  pregnant or trying to get pregnant  breast-feeding How should I use this medicine? This medicine is for injection under the skin. If you get this medicine at home, you will be taught how to prepare and give the pre-filled syringe or how to use the On-body Injector. Refer to the patient Instructions for Use for detailed instructions. Use exactly as directed. Tell your healthcare provider immediately if you suspect that the On-body Injector may not have performed as intended or if you suspect the use of the On-body Injector resulted in a missed or partial dose. It is important that you put your used needles and syringes in a special sharps container. Do not put them in a trash can. If you do not have a sharps container, call your pharmacist or healthcare provider to get one. Talk to your pediatrician regarding the use of this medicine in children. While this drug may be  prescribed for selected conditions, precautions do apply. Overdosage: If you think you have taken too much of this medicine contact a poison control center or emergency room at once. NOTE: This medicine is only for you. Do not share this medicine with others. What if I miss a dose? It is important not to miss your dose. Call your doctor or health care professional if you miss your dose. If you miss a dose due to an On-body Injector failure or leakage, a new dose should be administered as soon as possible using a single prefilled syringe for manual use. What may interact with this medicine? Interactions have not been studied. Give your health care provider a list of all the medicines, herbs, non-prescription drugs, or dietary supplements you use. Also tell them if you smoke, drink alcohol, or use illegal drugs. Some items may interact with your medicine. This list may not describe all possible interactions. Give your health care provider a list of all the medicines, herbs, non-prescription drugs, or dietary supplements you use. Also tell them if you smoke, drink alcohol, or use illegal drugs. Some items may interact with your medicine. What should I watch for while using this medicine? You may need blood work done while you are taking this medicine. If you are going to need a MRI, CT scan, or other procedure, tell your doctor that you are using this medicine (On-Body Injector only). What side effects may I notice from receiving this medicine? Side effects that you should report to your doctor or health care professional as soon as possible:  allergic reactions like skin rash, itching or hives, swelling of the   face, lips, or tongue  back pain  dizziness  fever  pain, redness, or irritation at site where injected  pinpoint red spots on the skin  red or dark-brown urine  shortness of breath or breathing problems  stomach or side pain, or pain at the  shoulder  swelling  tiredness  trouble passing urine or change in the amount of urine Side effects that usually do not require medical attention (report to your doctor or health care professional if they continue or are bothersome):  bone pain  muscle pain This list may not describe all possible side effects. Call your doctor for medical advice about side effects. You may report side effects to FDA at 1-800-FDA-1088. Where should I keep my medicine? Keep out of the reach of children. If you are using this medicine at home, you will be instructed on how to store it. Throw away any unused medicine after the expiration date on the label. NOTE: This sheet is a summary. It may not cover all possible information. If you have questions about this medicine, talk to your doctor, pharmacist, or health care provider.  2020 Elsevier/Gold Standard (2018-02-06 16:57:08) Implanted Port Home Guide An implanted port is a device that is placed under the skin. It is usually placed in the chest. The device can be used to give IV medicine, to take blood, or for dialysis. You may have an implanted port if:  You need IV medicine that would be irritating to the small veins in your hands or arms.  You need IV medicines, such as antibiotics, for a long period of time.  You need IV nutrition for a long period of time.  You need dialysis. Having a port means that your health care provider will not need to use the veins in your arms for these procedures. You may have fewer limitations when using a port than you would if you used other types of long-term IVs, and you will likely be able to return to normal activities after your incision heals. An implanted port has two main parts:  Reservoir. The reservoir is the part where a needle is inserted to give medicines or draw blood. The reservoir is round. After it is placed, it appears as a small, raised area under your skin.  Catheter. The catheter is a thin,  flexible tube that connects the reservoir to a vein. Medicine that is inserted into the reservoir goes into the catheter and then into the vein. How is my port accessed? To access your port:  A numbing cream may be placed on the skin over the port site.  Your health care provider will put on a mask and sterile gloves.  The skin over your port will be cleaned carefully with a germ-killing soap and allowed to dry.  Your health care provider will gently pinch the port and insert a needle into it.  Your health care provider will check for a blood return to make sure the port is in the vein and is not clogged.  If your port needs to remain accessed to get medicine continuously (constant infusion), your health care provider will place a clear bandage (dressing) over the needle site. The dressing and needle will need to be changed every week, or as told by your health care provider. What is flushing? Flushing helps keep the port from getting clogged. Follow instructions from your health care provider about how and when to flush the port. Ports are usually flushed with saline solution or a medicine   called heparin. The need for flushing will depend on how the port is used:  If the port is only used from time to time to give medicines or draw blood, the port may need to be flushed: ? Before and after medicines have been given. ? Before and after blood has been drawn. ? As part of routine maintenance. Flushing may be recommended every 4-6 weeks.  If a constant infusion is running, the port may not need to be flushed.  Throw away any syringes in a disposal container that is meant for sharp items (sharps container). You can buy a sharps container from a pharmacy, or you can make one by using an empty hard plastic bottle with a cover. How long will my port stay implanted? The port can stay in for as long as your health care provider thinks it is needed. When it is time for the port to come out, a  surgery will be done to remove it. The surgery will be similar to the procedure that was done to put the port in. Follow these instructions at home:   Flush your port as told by your health care provider.  If you need an infusion over several days, follow instructions from your health care provider about how to take care of your port site. Make sure you: ? Wash your hands with soap and water before you change your dressing. If soap and water are not available, use alcohol-based hand sanitizer. ? Change your dressing as told by your health care provider. ? Place any used dressings or infusion bags into a plastic bag. Throw that bag in the trash. ? Keep the dressing that covers the needle clean and dry. Do not get it wet. ? Do not use scissors or sharp objects near the tube. ? Keep the tube clamped, unless it is being used.  Check your port site every day for signs of infection. Check for: ? Redness, swelling, or pain. ? Fluid or blood. ? Pus or a bad smell.  Protect the skin around the port site. ? Avoid wearing bra straps that rub or irritate the site. ? Protect the skin around your port from seat belts. Place a soft pad over your chest if needed.  Bathe or shower as told by your health care provider. The site may get wet as long as you are not actively receiving an infusion.  Return to your normal activities as told by your health care provider. Ask your health care provider what activities are safe for you.  Carry a medical alert card or wear a medical alert bracelet at all times. This will let health care providers know that you have an implanted port in case of an emergency. Get help right away if:  You have redness, swelling, or pain at the port site.  You have fluid or blood coming from your port site.  You have pus or a bad smell coming from the port site.  You have a fever. Summary  Implanted ports are usually placed in the chest for long-term IV access.  Follow  instructions from your health care provider about flushing the port and changing bandages (dressings).  Take care of the area around your port by avoiding clothing that puts pressure on the area, and by watching for signs of infection.  Protect the skin around your port from seat belts. Place a soft pad over your chest if needed.  Get help right away if you have a fever or you have redness,   swelling, pain, drainage, or a bad smell at the port site. This information is not intended to replace advice given to you by your health care provider. Make sure you discuss any questions you have with your health care provider. Document Revised: 02/23/2019 Document Reviewed: 12/04/2016 Elsevier Patient Education  [place]. Influenza Virus Vaccine injection What is this medicine? INFLUENZA VIRUS VACCINE (in floo EN zuh VAHY ruhs vak SEEN) helps to reduce the risk of getting influenza also known as the flu. The vaccine only helps protect you against some strains of the flu. This medicine may be used for other purposes; ask your health care provider or pharmacist if you have questions. COMMON BRAND NAME(S): Afluria, Afluria Quadrivalent, Agriflu, Alfuria, FLUAD, Fluarix, Fluarix Quadrivalent, Flublok, Flublok Quadrivalent, FLUCELVAX, FLUCELVAX Quadrivalent, Flulaval, Flulaval Quadrivalent, Fluvirin, Fluzone, Fluzone High-Dose, Fluzone Intradermal, Fluzone Quadrivalent What should I tell my health care provider before I take this medicine? They need to know if you have any of these conditions:  bleeding disorder like hemophilia  fever or infection  Guillain-Barre syndrome or other neurological problems  immune system problems  infection with the human immunodeficiency virus (HIV) or AIDS  low blood platelet counts  multiple sclerosis  an unusual or allergic reaction to influenza virus vaccine, latex, other medicines, foods, dyes, or preservatives. Different brands of vaccines contain  different allergens. Some may contain latex or eggs. Talk to your doctor about your allergies to make sure that you get the right vaccine.  pregnant or trying to get pregnant  breast-feeding How should I use this medicine? This vaccine is for injection into a muscle or under the skin. It is given by a health care professional. A copy of Vaccine Information Statements will be given before each vaccination. Read this sheet carefully each time. The sheet may change frequently. Talk to your healthcare provider to see which vaccines are right for you. Some vaccines should not be used in all age groups. Overdosage: If you think you have taken too much of this medicine contact a poison control center or emergency room at once. NOTE: This medicine is only for you. Do not share this medicine with others. What if I miss a dose? This does not apply. What may interact with this medicine?  chemotherapy or radiation therapy  medicines that lower your immune system like etanercept, anakinra, infliximab, and adalimumab  medicines that treat or prevent blood clots like warfarin  phenytoin  steroid medicines like prednisone or cortisone  theophylline  vaccines This list may not describe all possible interactions. Give your health care provider a list of all the medicines, herbs, non-prescription drugs, or dietary supplements you use. Also tell them if you smoke, drink alcohol, or use illegal drugs. Some items may interact with your medicine. What should I watch for while using this medicine? Report any side effects that do not go away within 3 days to your doctor or health care professional. Call your health care provider if any unusual symptoms occur within 6 weeks of receiving this vaccine. You may still catch the flu, but the illness is not usually as bad. You cannot get the flu from the vaccine. The vaccine will not protect against colds or other illnesses that may cause fever. The vaccine is needed  every year. What side effects may I notice from receiving this medicine? Side effects that you should report to your doctor or health care professional as soon as possible:  allergic reactions like skin rash, itching or hives, swelling of  the face, lips, or tongue Side effects that usually do not require medical attention (report to your doctor or health care professional if they continue or are bothersome):  fever  headache  muscle aches and pains  pain, tenderness, redness, or swelling at the injection site  tiredness This list may not describe all possible side effects. Call your doctor for medical advice about side effects. You may report side effects to FDA at 1-800-FDA-1088. Where should I keep my medicine? The vaccine will be given by a health care professional in a clinic, pharmacy, doctor's office, or other health care setting. You will not be given vaccine doses to store at home. NOTE: This sheet is a summary. It may not cover all possible information. If you have questions about this medicine, talk to your doctor, pharmacist, or health care provider.  2020 Elsevier/Gold Standard (2018-09-26 08:45:43)

## 2020-09-10 NOTE — Telephone Encounter (Signed)
error 

## 2020-09-10 NOTE — Addendum Note (Signed)
Addended by: Delorise Jackson on: 09/10/2020 04:17 PM   Modules accepted: Orders

## 2020-09-11 ENCOUNTER — Ambulatory Visit: Payer: Self-pay | Admitting: Genetic Counselor

## 2020-09-11 DIAGNOSIS — Z1379 Encounter for other screening for genetic and chromosomal anomalies: Secondary | ICD-10-CM

## 2020-09-11 NOTE — Progress Notes (Signed)
HPI:  Aaron Johnston was previously seen in the [place] clinic due to a personal and family history of cancer and concerns regarding a hereditary predisposition to cancer. Please refer to our prior cancer genetics clinic note for more information regarding our discussion, assessment and recommendations, at the time. Aaron Johnston recent genetic test results were disclosed to him, as were recommendations warranted by these results. These results and recommendations are discussed in more detail below.  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[place])   07/28/2020 -  Chemotherapy   The patient had palonosetron (ALOXI) injection 0.25 mg, 0.25 mg, Intravenous,  Once, 3 of 4 cycles Administration: 0.25 mg (07/28/2020), 0.25 mg (08/18/2020), 0.25 mg (09/08/2020) pegfilgrastim-cbqv (UDENYCA) injection 6 mg, 6 mg, Subcutaneous, Once, 1 of 2 cycles Administration: 6 mg (09/10/2020) irinotecan (CAMPTOSAR) 200 mg in sodium chloride 0.9 % 500 mL chemo infusion, 112 mg/m2 = 200 mg (74.7 % of original dose 150 mg/m2), Intravenous,  Once, 0 of 1 cycle Dose modification: 112 mg/m2 (original dose 150 mg/m2, Cycle 4, Reason: Provider Judgment) leucovorin 562 mg in dextrose 5 % 250 mL infusion, 300 mg/m2 = 562 mg, Intravenous,  Once, 1 of 1 cycle Dose modification: 300 mg/m2 (original dose 300 mg/m2, Cycle 2, Reason: Other (see comments), Comment: place in D5W to run w/ [place]) Administration: 562 mg (08/18/2020) oxaliplatin (ELOXATIN) 120 mg in dextrose 5 % 500 mL chemo infusion, 65 mg/m2 = 120 mg (76.5 % of original dose 85 mg/m2), Intravenous,  Once, 3 of 4 cycles Dose modification: 65 mg/m2 (original dose 85 mg/m2, Cycle 1, Reason: Provider Judgment), 50 mg/m2 (original dose 85 mg/m2, Cycle 2, Reason: Provider Judgment) Administration: 120 mg (07/28/2020), 95 mg (08/18/2020), 95 mg  (09/08/2020) fosaprepitant (EMEND) 150 mg in sodium chloride 0.9 % 145 mL IVPB, 150 mg, Intravenous,  Once, 0 of 1 cycle fluorouracil (ADRUCIL) 4,500 mg in sodium chloride 0.9 % 60 mL chemo infusion, 2,400 mg/m2 = 4,500 mg, Intravenous, 1 Day/Dose, 3 of 4 cycles Dose modification: 2,000 mg/m2 (original dose 2,400 mg/m2, Cycle 2, Reason: Provider Judgment) Administration: 4,500 mg (07/28/2020), 3,750 mg (08/18/2020), 3,750 mg (09/08/2020) leucovorin 748 mg in sodium chloride 0.9 % 250 mL infusion, 400 mg/m2 = 748 mg, Intravenous,  Once, 3 of 4 cycles Dose modification: 300 mg/m2 (original dose 400 mg/m2, Cycle 2, Reason: Provider Judgment) Administration: 748 mg (07/28/2020), 562 mg (09/08/2020)  for chemotherapy treatment.      FAMILY HISTORY:  We obtained a detailed, 4-generation family history.  Significant diagnoses are listed below: Family History  Problem Relation Age of Onset  . Diabetes Mother   . Pancreatic cancer Father        d. 90  . Breast cancer Sister 60  . Diabetes Sister   . Diabetes Brother   . Stomach cancer Paternal Aunt        2 pat aunts with stomach cancer  . Colon cancer Paternal Uncle   . Cancer Paternal Grandfather        NOS  . Diabetes Sister   . Diabetes Brother   . Brain cancer Paternal Aunt   . Cancer Paternal Aunt        eye  . Diabetes Son     The patient has two sons who are cancer free.  He has two brothers and three sisters.  One sister had breast cancer at 66.  Both parents are deceased.  The patients father had pancreatic cancer and died at 58.  He had five sisters and four brothers.  One brother had colon cancer, two sisters had stomach cancer, a sister had brain cancer and a sister had eye cancer.  The paternal grandfather had a non-specific cancer.  The patient's mother died from complications of diabetes.  There is no additional information on this side of the family.  Aaron Johnston is unaware of previous family history of genetic testing  for hereditary cancer risks. Patient's maternal ancestors are of African American descent, and paternal ancestors are of African American descent. There is no reported Ashkenazi Jewish ancestry. There is no known consanguinity.    GENETIC TEST RESULTS: Genetic testing reported out on August 26, 2020 through the Custom pancreatic cancer panel found no pathogenic mutations. The Custom-Gene Panel offered by [facility] includes sequencing and/or deletion duplication testing of the following 91 genes: AIP, ALK, APC*, ATM*, AXIN2, BAP1, BARD1, BLM, BMPR1A, BRCA1, BRCA2, BRIP1, CASR, CDC73, CDH1, CDK4, CDKN1B, CDKN1C, CDKN2A (p14ARF), CDKN2A (p16INK4a), CEBPA, CFTR*, CHEK2, CPA1, CTNNA1, CTRC, DICER1*, DIS3L2, EGFR, EPCAM*, FANCC, FH*, FLCN, GATA2, GPC3*, GREM1*, HOXB13, HRAS, KIT, MAX*, MEN1*, MET*, MITF*, MLH1*, MSH2*, MSH3*, MSH6*, MUTYH, NBN, NF1*, NF2, NTHL1, PALB2, PALLD, PDGFRA, PHOX2B*, PMS2*, POLD1*, POLE, POT1, PRKAR1A, PRSS1*, PTCH1, PTEN*, RAD50, RAD51C, RAD51D, RB1*, RECQL4*, RET, RUNX1, SDHA*, SDHAF2, SDHB, SDHC*, SDHD, SMAD4, SMARCA4, SMARCB1, SMARCE1, SPINK1, STK11, SUFU, TERC, TERT, TMEM127, TP53, TSC1*, TSC2, VHL, WRN*, and WT1. The test report has been scanned into EPIC and is located under the Molecular Pathology section of the Results Review tab.  A portion of the result report is included below for reference.     We discussed with Aaron Johnston that because current genetic testing is not perfect, it is possible there may be a gene mutation in one of these genes that current testing cannot detect, but that chance is small.  We also discussed, that there could be another gene that has not yet been discovered, or that we have not yet tested, that is responsible for the cancer diagnoses in the family. It is also possible there is a hereditary cause for the cancer in the family that Aaron Johnston did not inherit and therefore was not identified in his testing.  Therefore, it is important to remain in  touch with cancer genetics in the future so that we can continue to offer Aaron Johnston the most up to date genetic testing.   Genetic testing did identify a variant of uncertain significance (VUS) was identified in the PMS2 gene called c.1058C>T (p.Ala353Val).  At this time, it is unknown if this variant is associated with increased cancer risk or if this is a normal finding, but most variants such as this get reclassified to being inconsequential. It should not be used to make medical management decisions. With time, we suspect the lab will determine the significance of this variant, if any. If we do learn more about it, we will try to contact Aaron Johnston to discuss it further. However, it is important to stay in touch with [place] periodically and keep the address and phone number up to date.  ADDITIONAL GENETIC TESTING: We discussed with Aaron Johnston that his genetic testing was fairly extensive.  If there are genes identified to increase cancer risk that can be analyzed in the future, we would be happy to discuss and coordinate this testing at that time.    CANCER SCREENING RECOMMENDATIONS: Aaron Johnston test result is considered  negative (normal).  This means that we have not identified a hereditary cause for his personal and family history of cancer at this time. Most cancers happen by chance and this negative test suggests that his cancer may fall into this category.    While reassuring, this does not definitively rule out a hereditary predisposition to cancer. It is still possible that there could be genetic mutations that are undetectable by current technology. There could be genetic mutations in genes that have not been tested or identified to increase cancer risk.  Therefore, it is recommended he continue to follow the cancer management and screening guidelines provided by his oncology and primary healthcare provider.   An individual's cancer risk and medical management are not determined by genetic test  results alone. Overall cancer risk assessment incorporates additional factors, including personal medical history, family history, and any available genetic information that may result in a personalized plan for cancer prevention and surveillance  RECOMMENDATIONS FOR FAMILY MEMBERS:  Individuals in this family might be at some increased risk of developing cancer, over the general population risk, simply due to the family history of cancer.  We recommended women in this family have a yearly mammogram beginning at age 34, or 30 years younger than the earliest onset of cancer, an annual clinical breast exam, and perform monthly breast self-exams. Women in this family should also have a gynecological exam as recommended by their primary provider. All family members should be referred for colonoscopy starting at age 58.  It is also possible there is a hereditary cause for the cancer in Aaron Johnston family that he did not inherit and therefore was not identified in him.  Based on Aaron Johnston family history, we recommended his sister, who was diagnosed with breast cancer at age 70, have genetic counseling and testing. Aaron Johnston will let us know if we can be of any assistance in coordinating genetic counseling and/or testing for this family member.   FOLLOW-UP: Lastly, we discussed with Aaron Johnston that cancer genetics is a rapidly advancing field and it is possible that new genetic tests will be appropriate for him and/or his family members in the future. We encouraged him to remain in contact with cancer genetics on an annual basis so we can update his personal and family histories and let him know of advances in cancer genetics that may benefit this family.   Our contact number was provided. Aaron Johnston questions were answered to his satisfaction, and he knows he is welcome to call us at anytime with additional questions or concerns.   Roma Kayser, [place], [facility] Licensed, Certified Genetic  Counselor [email or web address]

## 2020-09-28 ENCOUNTER — Other Ambulatory Visit: Payer: Self-pay | Admitting: Oncology

## 2020-09-29 ENCOUNTER — Other Ambulatory Visit: Payer: Self-pay | Admitting: Oncology

## 2020-09-29 ENCOUNTER — Inpatient Hospital Stay (HOSPITAL_BASED_OUTPATIENT_CLINIC_OR_DEPARTMENT_OTHER): Payer: Medicare Other | Admitting: Oncology

## 2020-09-29 ENCOUNTER — Inpatient Hospital Stay: Payer: Medicare Other

## 2020-09-29 ENCOUNTER — Inpatient Hospital Stay: Payer: Medicare Other | Attending: Genetic Counselor

## 2020-09-29 ENCOUNTER — Other Ambulatory Visit: Payer: Self-pay

## 2020-09-29 VITALS — BP 139/77 | HR 80 | Temp 98.1°F | Resp 20 | Ht 69.0 in | Wt 155.5 lb

## 2020-09-29 DIAGNOSIS — C250 Malignant neoplasm of head of pancreas: Secondary | ICD-10-CM

## 2020-09-29 DIAGNOSIS — M549 Dorsalgia, unspecified: Secondary | ICD-10-CM | POA: Diagnosis not present

## 2020-09-29 DIAGNOSIS — Z79899 Other long term (current) drug therapy: Secondary | ICD-10-CM | POA: Diagnosis not present

## 2020-09-29 DIAGNOSIS — D696 Thrombocytopenia, unspecified: Secondary | ICD-10-CM | POA: Diagnosis not present

## 2020-09-29 DIAGNOSIS — Z5111 Encounter for antineoplastic chemotherapy: Secondary | ICD-10-CM | POA: Diagnosis not present

## 2020-09-29 DIAGNOSIS — R197 Diarrhea, unspecified: Secondary | ICD-10-CM | POA: Diagnosis not present

## 2020-09-29 DIAGNOSIS — E119 Type 2 diabetes mellitus without complications: Secondary | ICD-10-CM | POA: Diagnosis not present

## 2020-09-29 DIAGNOSIS — G893 Neoplasm related pain (acute) (chronic): Secondary | ICD-10-CM | POA: Diagnosis not present

## 2020-09-29 DIAGNOSIS — C259 Malignant neoplasm of pancreas, unspecified: Secondary | ICD-10-CM | POA: Diagnosis not present

## 2020-09-29 DIAGNOSIS — Z95828 Presence of other vascular implants and grafts: Secondary | ICD-10-CM

## 2020-09-29 DIAGNOSIS — I10 Essential (primary) hypertension: Secondary | ICD-10-CM | POA: Diagnosis not present

## 2020-09-29 LAB — CBC WITH DIFFERENTIAL (CANCER CENTER ONLY)
Abs Immature Granulocytes: 0.05 10*3/uL (ref 0.00–0.07)
Basophils Absolute: 0 10*3/uL (ref 0.0–0.1)
Basophils Relative: 0 %
Eosinophils Absolute: 0 10*3/uL (ref 0.0–0.5)
Eosinophils Relative: 1 %
HCT: 29.9 % — ABNORMAL LOW (ref 39.0–52.0)
Hemoglobin: 9.5 g/dL — ABNORMAL LOW (ref 13.0–17.0)
Immature Granulocytes: 1 %
Lymphocytes Relative: 8 %
Lymphs Abs: 0.6 10*3/uL — ABNORMAL LOW (ref 0.7–4.0)
MCH: 30.5 pg (ref 26.0–34.0)
MCHC: 31.8 g/dL (ref 30.0–36.0)
MCV: 96.1 fL (ref 80.0–100.0)
Monocytes Absolute: 0.4 10*3/uL (ref 0.1–1.0)
Monocytes Relative: 6 %
Neutro Abs: 5.9 10*3/uL (ref 1.7–7.7)
Neutrophils Relative %: 84 %
Platelet Count: 79 10*3/uL — ABNORMAL LOW (ref 150–400)
RBC: 3.11 MIL/uL — ABNORMAL LOW (ref 4.22–5.81)
RDW: 15.8 % — ABNORMAL HIGH (ref 11.5–15.5)
WBC Count: 7 10*3/uL (ref 4.0–10.5)
nRBC: 0 % (ref 0.0–0.2)

## 2020-09-29 LAB — CMP (CANCER CENTER ONLY)
ALT: 22 U/L (ref 0–44)
AST: 21 U/L (ref 15–41)
Albumin: 3.3 g/dL — ABNORMAL LOW (ref 3.5–5.0)
Alkaline Phosphatase: 138 U/L — ABNORMAL HIGH (ref 38–126)
Anion gap: 6 (ref 5–15)
BUN: 13 mg/dL (ref 8–23)
CO2: 24 mmol/L (ref 22–32)
Calcium: 8.8 mg/dL — ABNORMAL LOW (ref 8.9–10.3)
Chloride: 109 mmol/L (ref 98–111)
Creatinine: 1.08 mg/dL (ref 0.61–1.24)
GFR, Estimated: >60 mL/min (ref >60)
Glucose, Bld: 285 mg/dL — ABNORMAL HIGH (ref 70–99)
Potassium: 4 mmol/L (ref 3.5–5.1)
Sodium: 139 mmol/L (ref 135–145)
Total Bilirubin: 1.1 mg/dL (ref 0.3–1.2)
Total Protein: 6.3 g/dL — ABNORMAL LOW (ref 6.5–8.1)

## 2020-09-29 MED ORDER — SODIUM CHLORIDE 0.9% FLUSH
10.0000 mL | INTRAVENOUS | Status: DC | PRN
  Filled 2020-09-29: qty 10

## 2020-09-29 MED ORDER — ATROPINE SULFATE 1 MG/ML IJ SOLN
INTRAMUSCULAR | Status: AC
  Filled 2020-09-29: qty 1

## 2020-09-29 MED ORDER — LEUCOVORIN CALCIUM INJECTION 350 MG
300.0000 mg/m2 | Freq: Once | INTRAVENOUS | Status: AC
  Administered 2020-09-29: 562 mg via INTRAVENOUS
  Filled 2020-09-29: qty 28.1

## 2020-09-29 MED ORDER — SODIUM CHLORIDE 0.9 % IV SOLN
2000.0000 mg/m2 | INTRAVENOUS | Status: DC
  Administered 2020-09-29: 3750 mg via INTRAVENOUS
  Filled 2020-09-29: qty 75

## 2020-09-29 MED ORDER — OXALIPLATIN CHEMO INJECTION 100 MG/20ML
50.0000 mg/m2 | Freq: Once | INTRAVENOUS | Status: AC
  Administered 2020-09-29: 95 mg via INTRAVENOUS
  Filled 2020-09-29: qty 19

## 2020-09-29 MED ORDER — SODIUM CHLORIDE 0.9 % IV SOLN
10.0000 mg | Freq: Once | INTRAVENOUS | Status: AC
  Administered 2020-09-29: 10 mg via INTRAVENOUS
  Filled 2020-09-29: qty 10

## 2020-09-29 MED ORDER — PALONOSETRON HCL INJECTION 0.25 MG/5ML
INTRAVENOUS | Status: AC
  Filled 2020-09-29: qty 5

## 2020-09-29 MED ORDER — DEXTROSE 5 % IV SOLN
Freq: Once | INTRAVENOUS | Status: AC
  Filled 2020-09-29: qty 250

## 2020-09-29 MED ORDER — HEPARIN SOD (PORK) LOCK FLUSH 100 UNIT/ML IV SOLN
500.0000 [IU] | Freq: Once | INTRAVENOUS | Status: DC | PRN
  Filled 2020-09-29: qty 5

## 2020-09-29 MED ORDER — PALONOSETRON HCL INJECTION 0.25 MG/5ML
0.2500 mg | Freq: Once | INTRAVENOUS | Status: AC
  Administered 2020-09-29: 0.25 mg via INTRAVENOUS

## 2020-09-29 MED ORDER — SODIUM CHLORIDE 0.9% FLUSH
10.0000 mL | INTRAVENOUS | Status: DC | PRN
  Administered 2020-09-29: 10 mL
  Filled 2020-09-29: qty 10

## 2020-09-29 NOTE — Progress Notes (Signed)
[facility] OFFICE PROGRESS NOTE   Diagnosis: Pancreas cancer  INTERVAL HISTORY:   Mr. Mathison completed another cycle of FOLFOX on 09/08/2020.  No nausea/vomiting or neuropathy symptoms.  He reports 1 mouth ulcer.  He is eating.  He has loose stools after eating.  He is taking Creon.  Imodium helps with the loose stools.  Abdominal pain has improved.  Objective:  Vital signs in last 24 hours:  Blood pressure 139/77, pulse 80, temperature 98.1 F (36.7 C), resp. rate 20, height _0  (1.753 m), weight 155 lb 8 oz (70.5 kg), SpO2 100 %.    HEENT: No thrush or ulcers Resp: Lungs clear bilaterally Cardio: Regular rate and rhythm GI: No hepatosplenomegaly, nontender, no mass Vascular: No leg edema  Skin: Mild hyperpigmentation of the hands  Portacath/PICC-without erythema  Lab Results:  Lab Results  Component Value Date   WBC 7.0 09/29/2020   HGB 9.5 (L) 09/29/2020   HCT 29.9 (L) 09/29/2020   MCV 96.1 09/29/2020   PLT 79 (L) 09/29/2020   NEUTROABS 5.9 09/29/2020    CMP  Lab Results  Component Value Date   NA 136 09/08/2020   K 4.3 09/08/2020   CL 105 09/08/2020   CO2 26 09/08/2020   GLUCOSE 272 (H) 09/08/2020   BUN 11 09/08/2020   CREATININE 0.88 09/08/2020   CALCIUM 9.1 09/08/2020   PROT 6.2 (L) 09/08/2020   ALBUMIN 3.5 09/08/2020   AST 24 09/08/2020   ALT 29 09/08/2020   ALKPHOS 110 09/08/2020   BILITOT 1.0 09/08/2020   GFRNONAA >60 09/08/2020   GFRAA >60 08/18/2020     Medications: I have reviewed the patient's current medications.   Assessment/Plan: 1. Pancreas cancer ? CT urology center 03/04/2020-haziness of the fat adjacent to the celiac axis and SMA with focal narrowing of the SMV, splenic vein, and splenoportal confluence ? 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 EUS 07/10/2020-no pancreas mass identified, 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, 1.5 cm aortocaval node biopsy-scant lymphoid material, no malignancy ? Diagnostic laparoscopy 07/17/2020-segment 3 and 4 liver nodules,adenocarcinoma, positive for pankeratin, CK7 and CK20, partially positive for TTF-1, negative for CDX2 ? Foundation 1-microsatellite stable, tumor mutation burden 4, K-ras G12D, subclonal RB1 alteration ? Elevated CA 19-9 ? Cycle 1 FOLFOX 07/28/2020 ? Cycle 2 FOLFOX 08/18/2020, oxaliplatin dose reduced due to thrombocytopenia ? Cycle 3 FOLFOX 09/08/2020 ? Cycle 4 FOLFOX 09/29/2020   2. Chronic thrombocytopenia 3. Diabetes 4. Hypertension 5. Abdomen/back pain secondary to #1    Disposition: Aaron Johnston has completed 3 cycles of FOLFOX.  He has tolerated the chemotherapy well.  The platelet count is stable today.  He will complete cycle 4 FOLFOX today.  The CA 19-9 was improved when he was here on 09/08/2020.  We will repeat a CA 19-9 today.  Mr. Domine will use Orajel and Biotene rinse for the mouth soreness.  He will increase the Creon to 3 tablets prior to meals to see if this helps with the diarrhea.  He will continue Imodium.  Mr. Seier will return for an office visit and chemotherapy in 3 weeks.  He will be referred for a restaging CT evaluation after cycle 5  FOLFOX.  We decided against adding irinotecan to the chemotherapy regimen due to persistent thrombocytopenia and diarrhea.  Betsy Coder, MD  09/29/2020  9:06 AM

## 2020-09-29 NOTE — Patient Instructions (Signed)

## 2020-09-29 NOTE — Progress Notes (Signed)
Per Dr. Benay Spice: OK to treat w/platelet count 79,000

## 2020-09-29 NOTE — Patient Instructions (Signed)
[facility] Discharge Instructions for Patients Receiving Chemotherapy  Today you received the following chemotherapy agents Oxaliplatin (ELOXATIN), Leucovorin, Irinotecan (CAMPTOSAR) & Flourouracil (ADRUCIL).  To help prevent nausea and vomiting after your treatment, we encourage you to take your nausea medication as prescribed.   If you develop nausea and vomiting that is not controlled by your nausea medication, call the clinic.   BELOW ARE SYMPTOMS THAT SHOULD BE REPORTED IMMEDIATELY:  *FEVER GREATER THAN 100.5 F  *CHILLS WITH OR WITHOUT FEVER  NAUSEA AND VOMITING THAT IS NOT CONTROLLED WITH YOUR NAUSEA MEDICATION  *UNUSUAL SHORTNESS OF BREATH  *UNUSUAL BRUISING OR BLEEDING  TENDERNESS IN MOUTH AND THROAT WITH OR WITHOUT PRESENCE OF ULCERS  *URINARY PROBLEMS  *BOWEL PROBLEMS  UNUSUAL RASH Items with * indicate a potential emergency and should be followed up as soon as possible.  Feel free to call the clinic should you have any questions or concerns. The clinic phone number is [phone].  Please show the CHEMO ALERT CARD at check-in to the Emergency Department and triage nurse.   

## 2020-09-30 LAB — CANCER ANTIGEN 19-9: CA 19-9: 933 U/mL — ABNORMAL HIGH (ref 0–35)

## 2020-10-01 ENCOUNTER — Telehealth: Payer: Self-pay | Admitting: Oncology

## 2020-10-01 ENCOUNTER — Inpatient Hospital Stay: Payer: Medicare Other

## 2020-10-01 ENCOUNTER — Other Ambulatory Visit: Payer: Self-pay

## 2020-10-01 DIAGNOSIS — C250 Malignant neoplasm of head of pancreas: Secondary | ICD-10-CM

## 2020-10-01 DIAGNOSIS — C259 Malignant neoplasm of pancreas, unspecified: Secondary | ICD-10-CM | POA: Diagnosis not present

## 2020-10-01 MED ORDER — SODIUM CHLORIDE 0.9% FLUSH
10.0000 mL | INTRAVENOUS | Status: DC | PRN
  Administered 2020-10-01: 10 mL
  Filled 2020-10-01: qty 10

## 2020-10-01 MED ORDER — HEPARIN SOD (PORK) LOCK FLUSH 100 UNIT/ML IV SOLN
500.0000 [IU] | Freq: Once | INTRAVENOUS | Status: AC | PRN
  Administered 2020-10-01: 500 [IU]
  Filled 2020-10-01: qty 5

## 2020-10-01 MED ORDER — PEGFILGRASTIM-CBQV 6 MG/0.6ML ~~LOC~~ SOSY
PREFILLED_SYRINGE | SUBCUTANEOUS | Status: AC
  Filled 2020-10-01: qty 0.6

## 2020-10-01 MED ORDER — PEGFILGRASTIM-CBQV 6 MG/0.6ML ~~LOC~~ SOSY
6.0000 mg | PREFILLED_SYRINGE | Freq: Once | SUBCUTANEOUS | Status: AC
  Administered 2020-10-01: 6 mg via SUBCUTANEOUS

## 2020-10-01 NOTE — Telephone Encounter (Signed)
Scheduled appointments per 11/15 los. Called patient, no answer. Left message for patient with appointment date and time.

## 2020-10-08 ENCOUNTER — Other Ambulatory Visit: Payer: Self-pay | Admitting: Nurse Practitioner

## 2020-10-08 ENCOUNTER — Other Ambulatory Visit: Payer: Self-pay | Admitting: *Deleted

## 2020-10-08 DIAGNOSIS — C250 Malignant neoplasm of head of pancreas: Secondary | ICD-10-CM

## 2020-10-19 ENCOUNTER — Other Ambulatory Visit: Payer: Self-pay | Admitting: Oncology

## 2020-10-19 DIAGNOSIS — C250 Malignant neoplasm of head of pancreas: Secondary | ICD-10-CM

## 2020-10-19 NOTE — Progress Notes (Signed)
[place]   [phone]) [phone] [phone]) [phone]   Clinic Follow up Note   Patient Care Team: [name], [name], MD as PCP - General (Family Medicine) Jonnie Finner, RN as Oncology Nurse Navigator Ladell Pier, MD as Consulting Physician (Oncology) 10/20/2020  CHIEF COMPLAINT: Follow up pancreas cancer   CURRENT THERAPY: FOLFOX q2 weeks, starting 07/28/2020   INTERVAL HISTORY: [name] returns for follow up and treatment as scheduled. He completed cycle 4 FOLFOX on 09/29/20.  He is doing well.  Does not experience cold sensitivity or neuropathy.  He has one mouth sore that comes and goes but does not limit oral intake.  Energy is adequate, he continues working part-time.  He continues to have periodic diarrhea that has improved, on Imodium.  Stools have been loose/soft lately but not watery.  Denies blood in stools or other bleeding.  No recent fever, chills, cough, chest pain, dyspnea, leg edema, pain or other concerns.   MEDICAL HISTORY:  Past Medical History:  Diagnosis Date  . Diabetes mellitus without complication ([place])   . Family history of breast cancer   . Family history of colon cancer   . Family history of pancreatic cancer   . Family history of stomach cancer     SURGICAL HISTORY: No past surgical history on file.  I have reviewed the social history and family history with the patient and they are unchanged from previous note.  ALLERGIES:  has No Known Allergies.  MEDICATIONS:  Current Outpatient Medications  Medication Sig Dispense Refill  . Accu-Chek FastClix Lancets MISC Apply topically.    Marland Kitchen ACCU-CHEK GUIDE test strip     . Blood Glucose Monitoring Suppl (ACCU-CHEK GUIDE) w/Device KIT See admin instructions.    Marland Kitchen glucosamine-chondroitin 500-400 MG tablet Take 1 tablet by mouth daily.    Marland Kitchen HYDROcodone-acetaminophen (NORCO/VICODIN) 5-325 MG tablet Take 1-2 tablets by mouth every 4 (four) hours as needed. 60 tablet 0  . Insulin  Lispro (HUMALOG KWIKPEN Fidelis) Inject into the skin 2 (two) times daily as needed. 10 units for cbg > 300 and 15 units for cbg > 350    . KLOR-CON M20 20 MEQ tablet TAKE 1 TABLET BY MOUTH EVERY DAY 30 tablet 1  . latanoprost (XALATAN) 0.005 % ophthalmic solution Place 1 drop into both eyes at bedtime.    . lidocaine-prilocaine (EMLA) cream Apply 1 application topically as directed. Apply to port site 1 hour prior to stick and cover with plastic wrap 30 g 2  . lipase/protease/amylase (CREON) 36000 UNITS CPEP capsule Take 2 capsules (72,000 Units total) by mouth 3 (three) times daily with meals. May also take 1 capsule (36,000 Units total) as needed. 180 capsule 5  . loperamide (IMODIUM) 2 MG capsule Take 2-4 mg by mouth as needed for diarrhea or loose stools.    . magnesium oxide (MAG-OX) 400 MG tablet TAKE 1 TABLET BY MOUTH TWICE A DAY 180 tablet 0  . ondansetron (ZOFRAN) 8 MG tablet Take 1 tablet (8 mg total) by mouth every 8 (eight) hours as needed for nausea or vomiting. 30 tablet 2  . pantoprazole (PROTONIX) 40 MG tablet Take 40 mg by mouth 2 (two) times daily.     . pioglitazone (ACTOS) 45 MG tablet Take 45 mg by mouth daily.    . prochlorperazine (COMPAZINE) 10 MG tablet Take 1 tablet (10 mg total) by mouth every 6 (six) hours as needed for nausea. 60 tablet 1  . ramipril (ALTACE) 5 MG capsule  Take 5 mg by mouth daily.    . simvastatin (ZOCOR) 80 MG tablet Take 0.5 tablets by mouth every evening.    . sitaGLIPtin (JANUVIA) 100 MG tablet Take 100 mg by mouth daily.    . diphenoxylate-atropine (LOMOTIL) 2.5-0.025 MG tablet Take 1-2 tablets by mouth 4 (four) times daily as needed for diarrhea or loose stools. 30 tablet 0   No current facility-administered medications for this visit.   Facility-Administered Medications Ordered in Other Visits  Medication Dose Route Frequency Provider Last Rate Last Admin  . dexamethasone (DECADRON) 10 mg in sodium chloride 0.9 % 50 mL IVPB  10 mg Intravenous Once  Ladell Pier, MD      . fluorouracil (ADRUCIL) 3,750 mg in sodium chloride 0.9 % 75 mL chemo infusion  2,000 mg/m2 (Treatment Plan Recorded) Intravenous 1 day or 1 dose Ladell Pier, MD      . leucovorin 562 mg in dextrose 5 % 250 mL infusion  300 mg/m2 (Treatment Plan Recorded) Intravenous Once Ladell Pier, MD      . oxaliplatin (ELOXATIN) 95 mg in dextrose 5 % 500 mL chemo infusion  50 mg/m2 (Treatment Plan Recorded) Intravenous Once Ladell Pier, MD        PHYSICAL EXAMINATION: ECOG PERFORMANCE STATUS: 1 - Symptomatic but completely ambulatory  Vitals:   10/20/20 1232  BP: 122/68  Pulse: 83  Resp: 17  Temp: 98.2 F (36.8 C)  SpO2: 100%   Filed Weights   10/20/20 1232  Weight: 155 lb 9.6 oz (70.6 kg)    GENERAL:alert, no distress and comfortable SKIN: No rash EYES: sclera clear OROPHARYNX: No thrush or ulcers NECK: Without mass LUNGS: clear with normal breathing effort HEART: regular rate & rhythm, no lower extremity edema ABDOMEN:abdomen soft, non-tender and normal bowel sounds NEURO: alert & oriented x 3 with fluent speech, no focal motor deficits PAC without erythema  LABORATORY DATA:  I have reviewed the data as listed CBC Latest Ref Rng & Units 10/20/2020 09/29/2020 09/08/2020  WBC 4.0 - 10.5 K/uL 4.9 7.0 2.3(L)  Hemoglobin 13.0 - 17.0 g/dL 9.5(L) 9.5(L) 9.9(L)  Hematocrit 39 - 52 % 29.0(L) 29.9(L) 30.6(L)  Platelets 150 - 400 K/uL 66(L) 79(L) 71(L)     CMP Latest Ref Rng & Units 10/20/2020 09/29/2020 09/08/2020  Glucose 70 - 99 mg/dL 223(H) 285(H) 272(H)  BUN 8 - 23 mg/dL '14 13 11  ' Creatinine 0.61 - 1.24 mg/dL 0.97 1.08 0.88  Sodium 135 - 145 mmol/L 138 139 136  Potassium 3.5 - 5.1 mmol/L 4.1 4.0 4.3  Chloride 98 - 111 mmol/L 105 109 105  CO2 22 - 32 mmol/L '27 24 26  ' Calcium 8.9 - 10.3 mg/dL 9.2 8.8(L) 9.1  Total Protein 6.5 - 8.1 g/dL 6.7 6.3(L) 6.2(L)  Total Bilirubin 0.3 - 1.2 mg/dL 1.0 1.1 1.0  Alkaline Phos 38 - 126 U/L 176(H) 138(H)  110  AST 15 - 41 U/L 35 21 24  ALT 0 - 44 U/L 44 22 29      RADIOGRAPHIC STUDIES: I have personally reviewed the radiological images as listed and agreed with the findings in the report. No results found.   ASSESSMENT & PLAN:  1. Pancreas cancer ? CT urology center 03/04/2020-haziness of the fat adjacent to the celiac axis and SMA with focal narrowing of the SMV, splenic vein, and splenoportal confluence ? 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 EUS 07/10/2020-no pancreas mass identified, 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, 1.5 cm aortocaval node biopsy-scant lymphoid material, no malignancy ? Diagnostic laparoscopy 07/17/2020-segment 3 and 4 liver nodules,adenocarcinoma, positive for pankeratin, CK7 and CK20, partially positive for TTF-1, negative for CDX2 ? Foundation 1-microsatellite stable, tumor mutation burden 4, K-ras G12D, subclonal RB1 alteration ? Elevated CA 19-9 ? Cycle 1 FOLFOX 07/28/2020 ? Cycle 2 FOLFOX 08/18/2020,oxaliplatin dose reduced due to thrombocytopenia ? Cycle 3 FOLFOX 09/08/2020 ? Cycle 4 FOLFOX 09/29/2020  ? Cycle 5 FOLFOX 10/20/2020  2. Chronic thrombocytopenia 3. Diabetes 4. Hypertension 5. Abdomen/back pain secondary to #1  Disposition: Mr. Morera appears stable.  He completed 4 cycles of FOLFOX.  He tolerates treatment well overall with mild mucositis and periodic diarrhea.  Side effects are well managed with supportive meds at home.  I will prescribe Lomotil to have on hand if Imodium becomes ineffective.  He is able to recover and function well.  We reviewed ways to improve his strength  and stamina.  We reviewed the CBC, CMP and mag from today, electrolytes are normal on oral replacement which he will continue.  The CA 19-9 has improved on treatment, we will follow-up on the pending level from today.  He will proceed with cycle 5 FOLFOX today as planned.  Irinotecan remains on hold for baseline thrombocytopenia and diarrhea. He is being referred for restaging CT in the next 2-3 weeks.  Follow-up in 3 weeks with CT results and next cycle.  The plan was reviewed with Dr. Benay Spice.   Orders Placed This Encounter  Procedures  . CT Abdomen Pelvis W Contrast    Standing Status:   Future    Standing Expiration Date:   10/20/2021    Order Specific Question:   If indicated for the ordered procedure, I authorize the administration of contrast media per Radiology protocol    Answer:   Yes    Order Specific Question:   Preferred imaging location?    Answer:   [facility]    Order Specific Question:   Is Oral Contrast requested for this exam?    Answer:   Yes, Per Radiology protocol  . CBC with Differential (Cancer Center Only)    Standing Status:   Future    Standing Expiration Date:   10/20/2021  . CMP ([place] only)    Standing Status:   Future    Standing Expiration Date:   10/20/2021  . CA 19.9    Standing Status:   Future    Standing Expiration Date:   10/20/2021   All questions were answered. The patient knows to call the clinic with any bleeding, problems, questions or concerns. No barriers to learning were detected.     Alla Feeling, NP 10/20/20

## 2020-10-20 ENCOUNTER — Inpatient Hospital Stay (HOSPITAL_BASED_OUTPATIENT_CLINIC_OR_DEPARTMENT_OTHER): Payer: Medicare Other | Admitting: Nurse Practitioner

## 2020-10-20 ENCOUNTER — Inpatient Hospital Stay: Payer: Medicare Other

## 2020-10-20 ENCOUNTER — Other Ambulatory Visit: Payer: Self-pay

## 2020-10-20 ENCOUNTER — Inpatient Hospital Stay: Payer: Medicare Other | Attending: Genetic Counselor

## 2020-10-20 ENCOUNTER — Encounter: Payer: Self-pay | Admitting: Nurse Practitioner

## 2020-10-20 VITALS — BP 122/68 | HR 83 | Temp 98.2°F | Resp 17 | Ht 69.0 in | Wt 155.6 lb

## 2020-10-20 DIAGNOSIS — C259 Malignant neoplasm of pancreas, unspecified: Secondary | ICD-10-CM | POA: Insufficient documentation

## 2020-10-20 DIAGNOSIS — M549 Dorsalgia, unspecified: Secondary | ICD-10-CM | POA: Diagnosis not present

## 2020-10-20 DIAGNOSIS — Z803 Family history of malignant neoplasm of breast: Secondary | ICD-10-CM | POA: Diagnosis not present

## 2020-10-20 DIAGNOSIS — I10 Essential (primary) hypertension: Secondary | ICD-10-CM | POA: Insufficient documentation

## 2020-10-20 DIAGNOSIS — C250 Malignant neoplasm of head of pancreas: Secondary | ICD-10-CM

## 2020-10-20 DIAGNOSIS — Z800 Family history of malignant neoplasm of digestive organs: Secondary | ICD-10-CM | POA: Diagnosis not present

## 2020-10-20 DIAGNOSIS — R197 Diarrhea, unspecified: Secondary | ICD-10-CM | POA: Insufficient documentation

## 2020-10-20 DIAGNOSIS — Z5189 Encounter for other specified aftercare: Secondary | ICD-10-CM | POA: Diagnosis not present

## 2020-10-20 DIAGNOSIS — Z5111 Encounter for antineoplastic chemotherapy: Secondary | ICD-10-CM | POA: Diagnosis not present

## 2020-10-20 DIAGNOSIS — E119 Type 2 diabetes mellitus without complications: Secondary | ICD-10-CM | POA: Diagnosis not present

## 2020-10-20 DIAGNOSIS — Z79899 Other long term (current) drug therapy: Secondary | ICD-10-CM | POA: Insufficient documentation

## 2020-10-20 DIAGNOSIS — Z95828 Presence of other vascular implants and grafts: Secondary | ICD-10-CM

## 2020-10-20 LAB — CBC WITH DIFFERENTIAL (CANCER CENTER ONLY)
Abs Immature Granulocytes: 0.01 10*3/uL (ref 0.00–0.07)
Basophils Absolute: 0 10*3/uL (ref 0.0–0.1)
Basophils Relative: 0 %
Eosinophils Absolute: 0 10*3/uL (ref 0.0–0.5)
Eosinophils Relative: 0 %
HCT: 29 % — ABNORMAL LOW (ref 39.0–52.0)
Hemoglobin: 9.5 g/dL — ABNORMAL LOW (ref 13.0–17.0)
Immature Granulocytes: 0 %
Lymphocytes Relative: 12 %
Lymphs Abs: 0.6 10*3/uL — ABNORMAL LOW (ref 0.7–4.0)
MCH: 31.7 pg (ref 26.0–34.0)
MCHC: 32.8 g/dL (ref 30.0–36.0)
MCV: 96.7 fL (ref 80.0–100.0)
Monocytes Absolute: 0.3 10*3/uL (ref 0.1–1.0)
Monocytes Relative: 6 %
Neutro Abs: 4 10*3/uL (ref 1.7–7.7)
Neutrophils Relative %: 82 %
Platelet Count: 66 10*3/uL — ABNORMAL LOW (ref 150–400)
RBC: 3 MIL/uL — ABNORMAL LOW (ref 4.22–5.81)
RDW: 15.8 % — ABNORMAL HIGH (ref 11.5–15.5)
WBC Count: 4.9 10*3/uL (ref 4.0–10.5)
nRBC: 0 % (ref 0.0–0.2)

## 2020-10-20 LAB — CMP (CANCER CENTER ONLY)
ALT: 44 U/L (ref 0–44)
AST: 35 U/L (ref 15–41)
Albumin: 3.6 g/dL (ref 3.5–5.0)
Alkaline Phosphatase: 176 U/L — ABNORMAL HIGH (ref 38–126)
Anion gap: 6 (ref 5–15)
BUN: 14 mg/dL (ref 8–23)
CO2: 27 mmol/L (ref 22–32)
Calcium: 9.2 mg/dL (ref 8.9–10.3)
Chloride: 105 mmol/L (ref 98–111)
Creatinine: 0.97 mg/dL (ref 0.61–1.24)
GFR, Estimated: >60 mL/min (ref >60)
Glucose, Bld: 223 mg/dL — ABNORMAL HIGH (ref 70–99)
Potassium: 4.1 mmol/L (ref 3.5–5.1)
Sodium: 138 mmol/L (ref 135–145)
Total Bilirubin: 1 mg/dL (ref 0.3–1.2)
Total Protein: 6.7 g/dL (ref 6.5–8.1)

## 2020-10-20 LAB — MAGNESIUM: Magnesium: 1.7 mg/dL (ref 1.7–2.4)

## 2020-10-20 MED ORDER — PALONOSETRON HCL INJECTION 0.25 MG/5ML
0.2500 mg | Freq: Once | INTRAVENOUS | Status: AC
  Administered 2020-10-20: 0.25 mg via INTRAVENOUS

## 2020-10-20 MED ORDER — SODIUM CHLORIDE 0.9% FLUSH
10.0000 mL | INTRAVENOUS | Status: DC | PRN
  Administered 2020-10-20: 10 mL
  Filled 2020-10-20: qty 10

## 2020-10-20 MED ORDER — OXALIPLATIN CHEMO INJECTION 100 MG/20ML
50.0000 mg/m2 | Freq: Once | INTRAVENOUS | Status: AC
  Administered 2020-10-20: 95 mg via INTRAVENOUS
  Filled 2020-10-20: qty 19

## 2020-10-20 MED ORDER — DIPHENOXYLATE-ATROPINE 2.5-0.025 MG PO TABS
1.0000 | ORAL_TABLET | Freq: Four times a day (QID) | ORAL | 0 refills | Status: DC | PRN
Start: 2020-10-20 — End: 2021-03-23

## 2020-10-20 MED ORDER — SODIUM CHLORIDE 0.9 % IV SOLN
10.0000 mg | Freq: Once | INTRAVENOUS | Status: AC
  Administered 2020-10-20: 10 mg via INTRAVENOUS
  Filled 2020-10-20: qty 10

## 2020-10-20 MED ORDER — LEUCOVORIN CALCIUM INJECTION 350 MG
300.0000 mg/m2 | Freq: Once | INTRAVENOUS | Status: AC
  Administered 2020-10-20: 562 mg via INTRAVENOUS
  Filled 2020-10-20: qty 28.1

## 2020-10-20 MED ORDER — DEXTROSE 5 % IV SOLN
Freq: Once | INTRAVENOUS | Status: AC
  Filled 2020-10-20: qty 250

## 2020-10-20 MED ORDER — PALONOSETRON HCL INJECTION 0.25 MG/5ML
INTRAVENOUS | Status: AC
  Filled 2020-10-20: qty 5

## 2020-10-20 MED ORDER — SODIUM CHLORIDE 0.9 % IV SOLN
2000.0000 mg/m2 | INTRAVENOUS | Status: DC
  Administered 2020-10-20: 3750 mg via INTRAVENOUS
  Filled 2020-10-20: qty 75

## 2020-10-20 NOTE — Progress Notes (Signed)
Per Cira Rue, NP, ok to treat with platelets of 66  Patient completed infusion without incident. In no visible distress at time of discharge. Ambulated out of cancer center. AVS provided.

## 2020-10-20 NOTE — Patient Instructions (Signed)
[facility] Discharge Instructions for Patients Receiving Chemotherapy  Today you received the following chemotherapy agents: oxaliplatin/leucovorin/fluorouracil.  To help prevent nausea and vomiting after your treatment, we encourage you to take your nausea medication as directed.  If you develop nausea and vomiting that is not controlled by your nausea medication, call the clinic.   BELOW ARE SYMPTOMS THAT SHOULD BE REPORTED IMMEDIATELY:  *FEVER GREATER THAN 100.5 F  *CHILLS WITH OR WITHOUT FEVER  NAUSEA AND VOMITING THAT IS NOT CONTROLLED WITH YOUR NAUSEA MEDICATION  *UNUSUAL SHORTNESS OF BREATH  *UNUSUAL BRUISING OR BLEEDING  TENDERNESS IN MOUTH AND THROAT WITH OR WITHOUT PRESENCE OF ULCERS  *URINARY PROBLEMS  *BOWEL PROBLEMS  UNUSUAL RASH Items with * indicate a potential emergency and should be followed up as soon as possible.  Feel free to call the clinic should you have any questions or concerns. The clinic phone number is [phone].  Please show the CHEMO ALERT CARD at check-in to the Emergency Department and triage nurse.   

## 2020-10-21 ENCOUNTER — Telehealth: Payer: Self-pay | Admitting: Nurse Practitioner

## 2020-10-21 LAB — CANCER ANTIGEN 19-9: CA 19-9: 539 U/mL — ABNORMAL HIGH (ref 0–35)

## 2020-10-21 NOTE — Telephone Encounter (Signed)
Scheduled appointments per 12/6 los. Spoke to patient's wife who is aware of appointments dates and times.

## 2020-10-22 ENCOUNTER — Other Ambulatory Visit: Payer: Self-pay | Admitting: Medical

## 2020-10-22 ENCOUNTER — Inpatient Hospital Stay (HOSPITAL_BASED_OUTPATIENT_CLINIC_OR_DEPARTMENT_OTHER): Payer: Medicare Other | Admitting: Medical

## 2020-10-22 ENCOUNTER — Other Ambulatory Visit: Payer: Self-pay

## 2020-10-22 ENCOUNTER — Inpatient Hospital Stay: Payer: Medicare Other

## 2020-10-22 VITALS — BP 118/54 | HR 65 | Resp 16

## 2020-10-22 DIAGNOSIS — C250 Malignant neoplasm of head of pancreas: Secondary | ICD-10-CM

## 2020-10-22 DIAGNOSIS — R350 Frequency of micturition: Secondary | ICD-10-CM

## 2020-10-22 DIAGNOSIS — R300 Dysuria: Secondary | ICD-10-CM

## 2020-10-22 DIAGNOSIS — C259 Malignant neoplasm of pancreas, unspecified: Secondary | ICD-10-CM | POA: Diagnosis not present

## 2020-10-22 LAB — URINALYSIS, COMPLETE (UACMP) WITH MICROSCOPIC
Bacteria, UA: NONE SEEN
Bilirubin Urine: NEGATIVE
Glucose, UA: 50 mg/dL — AB
Ketones, ur: NEGATIVE mg/dL
Leukocytes,Ua: NEGATIVE
Nitrite: NEGATIVE
Protein, ur: NEGATIVE mg/dL
Specific Gravity, Urine: 1.014 (ref 1.005–1.030)
pH: 5 (ref 5.0–8.0)

## 2020-10-22 MED ORDER — SULFAMETHOXAZOLE-TRIMETHOPRIM 800-160 MG PO TABS: 1.0000 | ORAL_TABLET | Freq: Two times a day (BID) | ORAL | 0 refills | Status: DC

## 2020-10-22 MED ORDER — PEGFILGRASTIM-CBQV 6 MG/0.6ML ~~LOC~~ SOSY
PREFILLED_SYRINGE | SUBCUTANEOUS | Status: AC
  Filled 2020-10-22: qty 0.6

## 2020-10-22 MED ORDER — PEGFILGRASTIM-CBQV 6 MG/0.6ML ~~LOC~~ SOSY
6.0000 mg | PREFILLED_SYRINGE | Freq: Once | SUBCUTANEOUS | Status: AC
  Administered 2020-10-22: 6 mg via SUBCUTANEOUS

## 2020-10-22 MED ORDER — SODIUM CHLORIDE 0.9% FLUSH
10.0000 mL | INTRAVENOUS | Status: DC | PRN
  Administered 2020-10-22: 10 mL
  Filled 2020-10-22: qty 10

## 2020-10-22 MED ORDER — HEPARIN SOD (PORK) LOCK FLUSH 100 UNIT/ML IV SOLN
500.0000 [IU] | Freq: Once | INTRAVENOUS | Status: AC | PRN
  Administered 2020-10-22: 500 [IU]
  Filled 2020-10-22: qty 5

## 2020-10-22 NOTE — Progress Notes (Signed)
Pump d/c appt. Patient c/o urinary frequency and urgency. Denies dysuria. Also c/o minor mucositis that improves with Biotene rinses. States mouth sore is not hindering ability to eat. No other complaints. Sandi Mealy, PA-C aware of patient's urinary complaints and saw patient in flush room. Urine sample obtained. Patient discharged in stable condition.

## 2020-10-22 NOTE — Progress Notes (Signed)
Mr. Metoyer was seen in the flush room today.  He reports having dysuria.  A urinalysis and urine culture were collected.  He was placed on Bactrim DS p.o. twice daily while awaiting the results of his urinalysis and culture.  He was told to push fluids.  Sandi Mealy, MHS, PA-C Physician Assistant.

## 2020-10-23 LAB — URINE CULTURE: Culture: NO GROWTH

## 2020-11-05 ENCOUNTER — Other Ambulatory Visit: Payer: Self-pay | Admitting: Oncology

## 2020-11-05 DIAGNOSIS — C250 Malignant neoplasm of head of pancreas: Secondary | ICD-10-CM

## 2020-11-06 ENCOUNTER — Encounter (HOSPITAL_COMMUNITY): Payer: Self-pay

## 2020-11-06 ENCOUNTER — Other Ambulatory Visit: Payer: Self-pay

## 2020-11-06 ENCOUNTER — Ambulatory Visit (HOSPITAL_COMMUNITY)
Admission: RE | Admit: 2020-11-06 | Discharge: 2020-11-06 | Disposition: A | Discharge status: Home / Self Care | Payer: Medicare Other | Source: Ambulatory Visit | Attending: Nurse Practitioner | Admitting: Nurse Practitioner

## 2020-11-06 DIAGNOSIS — C250 Malignant neoplasm of head of pancreas: Secondary | ICD-10-CM

## 2020-11-06 MED ORDER — IOHEXOL 300 MG/ML  SOLN
100.0000 mL | Freq: Once | INTRAMUSCULAR | Status: AC | PRN
  Administered 2020-11-06: 100 mL via INTRAVENOUS

## 2020-11-10 ENCOUNTER — Inpatient Hospital Stay: Payer: Medicare Other

## 2020-11-10 ENCOUNTER — Inpatient Hospital Stay (HOSPITAL_BASED_OUTPATIENT_CLINIC_OR_DEPARTMENT_OTHER): Payer: Medicare Other | Admitting: Nurse Practitioner

## 2020-11-10 ENCOUNTER — Encounter: Payer: Self-pay | Admitting: Nurse Practitioner

## 2020-11-10 ENCOUNTER — Other Ambulatory Visit: Payer: Self-pay

## 2020-11-10 VITALS — BP 122/56 | HR 80 | Temp 97.7°F | Resp 14 | Ht 69.0 in | Wt 151.4 lb

## 2020-11-10 DIAGNOSIS — C250 Malignant neoplasm of head of pancreas: Secondary | ICD-10-CM

## 2020-11-10 DIAGNOSIS — C259 Malignant neoplasm of pancreas, unspecified: Secondary | ICD-10-CM | POA: Diagnosis not present

## 2020-11-10 DIAGNOSIS — Z95828 Presence of other vascular implants and grafts: Secondary | ICD-10-CM

## 2020-11-10 LAB — CMP (CANCER CENTER ONLY)
ALT: 55 U/L — ABNORMAL HIGH (ref 0–44)
AST: 37 U/L (ref 15–41)
Albumin: 3.5 g/dL (ref 3.5–5.0)
Alkaline Phosphatase: 175 U/L — ABNORMAL HIGH (ref 38–126)
Anion gap: 7 (ref 5–15)
BUN: 14 mg/dL (ref 8–23)
CO2: 23 mmol/L (ref 22–32)
Calcium: 8.9 mg/dL (ref 8.9–10.3)
Chloride: 107 mmol/L (ref 98–111)
Creatinine: 0.92 mg/dL (ref 0.61–1.24)
GFR, Estimated: >60 mL/min (ref >60)
Glucose, Bld: 184 mg/dL — ABNORMAL HIGH (ref 70–99)
Potassium: 4.2 mmol/L (ref 3.5–5.1)
Sodium: 137 mmol/L (ref 135–145)
Total Bilirubin: 0.7 mg/dL (ref 0.3–1.2)
Total Protein: 6.4 g/dL — ABNORMAL LOW (ref 6.5–8.1)

## 2020-11-10 LAB — CBC WITH DIFFERENTIAL (CANCER CENTER ONLY)
Abs Immature Granulocytes: 0.03 10*3/uL (ref 0.00–0.07)
Basophils Absolute: 0 10*3/uL (ref 0.0–0.1)
Basophils Relative: 0 %
Eosinophils Absolute: 0.1 10*3/uL (ref 0.0–0.5)
Eosinophils Relative: 2 %
HCT: 30.5 % — ABNORMAL LOW (ref 39.0–52.0)
Hemoglobin: 9.6 g/dL — ABNORMAL LOW (ref 13.0–17.0)
Immature Granulocytes: 0 %
Lymphocytes Relative: 9 %
Lymphs Abs: 0.6 10*3/uL — ABNORMAL LOW (ref 0.7–4.0)
MCH: 31.6 pg (ref 26.0–34.0)
MCHC: 31.5 g/dL (ref 30.0–36.0)
MCV: 100.3 fL — ABNORMAL HIGH (ref 80.0–100.0)
Monocytes Absolute: 0.4 10*3/uL (ref 0.1–1.0)
Monocytes Relative: 6 %
Neutro Abs: 6 10*3/uL (ref 1.7–7.7)
Neutrophils Relative %: 83 %
Platelet Count: 77 10*3/uL — ABNORMAL LOW (ref 150–400)
RBC: 3.04 MIL/uL — ABNORMAL LOW (ref 4.22–5.81)
RDW: 14.8 % (ref 11.5–15.5)
WBC Count: 7.2 10*3/uL (ref 4.0–10.5)
nRBC: 0 % (ref 0.0–0.2)

## 2020-11-10 MED ORDER — ATROPINE SULFATE 1 MG/ML IJ SOLN: 0.5000 mg | Freq: Once | INTRAMUSCULAR | Status: DC | PRN

## 2020-11-10 MED ORDER — LEUCOVORIN CALCIUM INJECTION 350 MG
300.0000 mg/m2 | Freq: Once | INTRAVENOUS | Status: AC
  Administered 2020-11-10: 562 mg via INTRAVENOUS
  Filled 2020-11-10: qty 28.1

## 2020-11-10 MED ORDER — DEXTROSE 5 % IV SOLN
Freq: Once | INTRAVENOUS | Status: AC
  Filled 2020-11-10: qty 250

## 2020-11-10 MED ORDER — MAGNESIUM OXIDE 400 MG PO TABS: 1.0000 | ORAL_TABLET | Freq: Two times a day (BID) | ORAL | 0 refills | Status: DC

## 2020-11-10 MED ORDER — SODIUM CHLORIDE 0.9 % IV SOLN
2000.0000 mg/m2 | INTRAVENOUS | Status: DC
  Administered 2020-11-10: 3750 mg via INTRAVENOUS
  Filled 2020-11-10: qty 75

## 2020-11-10 MED ORDER — SODIUM CHLORIDE 0.9 % IV SOLN
10.0000 mg | Freq: Once | INTRAVENOUS | Status: AC
  Administered 2020-11-10: 10 mg via INTRAVENOUS
  Filled 2020-11-10: qty 10

## 2020-11-10 MED ORDER — OXALIPLATIN CHEMO INJECTION 100 MG/20ML
50.0000 mg/m2 | Freq: Once | INTRAVENOUS | Status: AC
  Administered 2020-11-10: 95 mg via INTRAVENOUS
  Filled 2020-11-10: qty 19

## 2020-11-10 MED ORDER — SODIUM CHLORIDE 0.9% FLUSH
10.0000 mL | INTRAVENOUS | Status: DC | PRN
  Administered 2020-11-10: 10 mL
  Filled 2020-11-10: qty 10

## 2020-11-10 MED ORDER — PALONOSETRON HCL INJECTION 0.25 MG/5ML
INTRAVENOUS | Status: AC
  Filled 2020-11-10: qty 5

## 2020-11-10 MED ORDER — PALONOSETRON HCL INJECTION 0.25 MG/5ML
0.2500 mg | Freq: Once | INTRAVENOUS | Status: AC
  Administered 2020-11-10: 0.25 mg via INTRAVENOUS

## 2020-11-10 NOTE — Patient Instructions (Signed)
[facility] Discharge Instructions for Patients Receiving Chemotherapy  Today you received the following chemotherapy agents: oxaliplatin/leucovorin/fluorouracil.  To help prevent nausea and vomiting after your treatment, we encourage you to take your nausea medication as directed.  If you develop nausea and vomiting that is not controlled by your nausea medication, call the clinic.   BELOW ARE SYMPTOMS THAT SHOULD BE REPORTED IMMEDIATELY:  *FEVER GREATER THAN 100.5 F  *CHILLS WITH OR WITHOUT FEVER  NAUSEA AND VOMITING THAT IS NOT CONTROLLED WITH YOUR NAUSEA MEDICATION  *UNUSUAL SHORTNESS OF BREATH  *UNUSUAL BRUISING OR BLEEDING  TENDERNESS IN MOUTH AND THROAT WITH OR WITHOUT PRESENCE OF ULCERS  *URINARY PROBLEMS  *BOWEL PROBLEMS  UNUSUAL RASH Items with * indicate a potential emergency and should be followed up as soon as possible.  Feel free to call the clinic should you have any questions or concerns. The clinic phone number is [phone].  Please show the CHEMO ALERT CARD at check-in to the Emergency Department and triage nurse.   

## 2020-11-10 NOTE — Progress Notes (Addendum)
[facility] OFFICE PROGRESS NOTE   Diagnosis: Pancreas cancer  INTERVAL HISTORY:   Aaron Johnston returns as scheduled.  He completed cycle 5 FOLFOX 10/20/2020.  He denies nausea/vomiting.  He reports a single mouth sore at the right lower gumline at the base of the tooth.  He continues to have loose stools, mainly after eating.  He does not take Creon with each meal.  The loose stools do not increase following chemotherapy.  He denies abdominal pain.  He typically does not experience cold sensitivity.  Objective:  Vital signs in last 24 hours:  Blood pressure (!) 122/56, pulse 80, temperature 97.7 F (36.5 C), temperature source Tympanic, resp. rate 14, height '5\' 9"'  (1.753 m), weight 151 lb 6.4 oz (68.7 kg), SpO2 100 %.    HEENT: No thrush or ulcers. Resp: Lungs clear bilaterally. Cardio: Regular rate and rhythm. GI: Abdomen soft and nontender.  No hepatomegaly. Vascular: No leg edema. Neuro: Vibratory sense intact over the fingertips per tuning fork exam. Skin: Palms with scattered areas of hyperpigmentation.  No erythema.  No skin breakdown. Port-A-Cath without erythema.   Lab Results:  Lab Results  Component Value Date   WBC 7.2 11/10/2020   HGB 9.6 (L) 11/10/2020   HCT 30.5 (L) 11/10/2020   MCV 100.3 (H) 11/10/2020   PLT 77 (L) 11/10/2020   NEUTROABS 6.0 11/10/2020    Imaging:  No results found.  Medications: I have reviewed the patient's current medications.  Assessment/Plan: 1. Pancreas cancer ? CT urology center 03/04/2020-haziness of the fat adjacent to the celiac axis and SMA with focal narrowing of the SMV, splenic vein, and splenoportal confluence ? CT 07/07/2020-infiltrative retroperitoneal mass with encasement of the celiac axis and SMA, high-grade narrowing of the proximal portal vein with cavernous transformation, nonocclusive thrombus within the main portal vein, mild biliary ductal dilatation, diffuse hypodense/hypoenhancing areas within the  liver (edema versus infiltrating neoplasm), 2 x 1.7 cm area of focal prominence in the pancreas ? EUS 07/10/2020-no pancreas mass identified, 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, 1.5 cm aortocaval node biopsy-scant lymphoid material, no malignancy ? Diagnostic laparoscopy 07/17/2020-segment 3 and 4 liver nodules,adenocarcinoma, positive for pankeratin, CK7 and CK20, partially positive for TTF-1, negative for CDX2 ? Foundation 1-microsatellite stable, tumor mutation burden 4, K-ras G12D, subclonal RB1 alteration ? Elevated CA 19-9 ? Cycle 1 FOLFOX 07/28/2020 ? Cycle 2 FOLFOX 08/18/2020,oxaliplatin dose reduced due to thrombocytopenia ? Cycle 3 FOLFOX 09/08/2020 ? Cycle 4 FOLFOX 09/29/2020 ? Cycle 5 FOLFOX 10/20/2020 ? CTs 11/06/2020-grossly stable infiltrative mass within the pancreatic head and porta hepatis.  New small low-density liver lesions.  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 ? Cycle 6 FOLFOX 11/10/2020   2. Chronic thrombocytopenia 3. Diabetes 4. Hypertension 5. Abdomen/back pain secondary to #1   Disposition: Aaron Johnston appears stable.  He has completed 5 cycles of FOLFOX.  Recent restaging CTs show some new small liver lesions.  Dr. Benay Johnston has reviewed the CT report/images and recommends continuation of FOLFOX based on significant improvement in abdominal pain and continued improvement in the CA 19-9.  His case will be presented at an upcoming GI tumor conference.  Plan to proceed with cycle 6 FOLFOX today as scheduled.  We reviewed the CBC from today.  Counts adequate to proceed with treatment.  He has stable moderate thrombocytopenia.  He will return for lab, follow-up, FOLFOX  in 3 weeks.  He will contact the office in the interim with any problems.  Patient seen with Dr. Benay Johnston.  CT report/images reviewed with Aaron Johnston and his wife  at today's visit.    [name] ANP/GNP-BC   11/10/2020  9:42 AM This was a shared visit with [name].  We reviewed the restaging CT findings and images with Aaron Johnston and his wife.  There are possible new lesions in the liver.  This finding could be related to altered perfusion.  I will present his case at the GI tumor conference for further review of the images.  His clinical status has improved since beginning FOLFIRINOX and the CA 19-9 is lower.  The plan is to continue FOLFIRINOX for now.  Aaron Johnston agrees.  [name], MD

## 2020-11-10 NOTE — Patient Instructions (Signed)

## 2020-11-11 ENCOUNTER — Telehealth: Payer: Self-pay | Admitting: Nurse Practitioner

## 2020-11-11 LAB — CANCER ANTIGEN 19-9: CA 19-9: 507 U/mL — ABNORMAL HIGH (ref 0–35)

## 2020-11-11 NOTE — Telephone Encounter (Signed)
Scheduled appointments per 12/27 los. Spoke to patient's wife who is aware of appointments dates and times.  

## 2020-11-12 ENCOUNTER — Other Ambulatory Visit: Payer: Self-pay

## 2020-11-12 ENCOUNTER — Inpatient Hospital Stay: Payer: Medicare Other

## 2020-11-12 ENCOUNTER — Telehealth: Payer: Self-pay

## 2020-11-12 VITALS — BP 120/64 | HR 75 | Temp 98.0°F

## 2020-11-12 DIAGNOSIS — C259 Malignant neoplasm of pancreas, unspecified: Secondary | ICD-10-CM | POA: Diagnosis not present

## 2020-11-12 DIAGNOSIS — C250 Malignant neoplasm of head of pancreas: Secondary | ICD-10-CM

## 2020-11-12 MED ORDER — HEPARIN SOD (PORK) LOCK FLUSH 100 UNIT/ML IV SOLN
500.0000 [IU] | Freq: Once | INTRAVENOUS | Status: AC | PRN
  Administered 2020-11-12: 500 [IU]
  Filled 2020-11-12: qty 5

## 2020-11-12 MED ORDER — PEGFILGRASTIM-CBQV 6 MG/0.6ML ~~LOC~~ SOSY
6.0000 mg | PREFILLED_SYRINGE | Freq: Once | SUBCUTANEOUS | Status: AC
  Administered 2020-11-12: 6 mg via SUBCUTANEOUS

## 2020-11-12 MED ORDER — PEGFILGRASTIM-CBQV 6 MG/0.6ML ~~LOC~~ SOSY
PREFILLED_SYRINGE | SUBCUTANEOUS | Status: AC
  Filled 2020-11-12: qty 0.6

## 2020-11-12 MED ORDER — SODIUM CHLORIDE 0.9% FLUSH
10.0000 mL | INTRAVENOUS | Status: DC | PRN
  Administered 2020-11-12: 10 mL
  Filled 2020-11-12: qty 10

## 2020-11-12 NOTE — Progress Notes (Signed)
The proposed treatment discussed in conference is for discussion purposes only and is not a binding recommendation.  The patients have not been physically examined, or presented with their treatment options.  Therefore, final treatment plans cannot be decided.   

## 2020-11-12 NOTE — Patient Instructions (Signed)

## 2020-11-12 NOTE — Telephone Encounter (Signed)
Spoke with patient's wife Burna Mortimer regarding Tumor Board discussion this morning.  I explained that we reviewed his CT scan and it was felt that the small liver lesions are new and that we need to obtain a MR of the abdomen to evaluate these further.  She was in agreement with Dr. Truett Perna ordering and he was made aware.

## 2020-11-13 ENCOUNTER — Other Ambulatory Visit: Payer: Self-pay | Admitting: Oncology

## 2020-11-13 DIAGNOSIS — C250 Malignant neoplasm of head of pancreas: Secondary | ICD-10-CM

## 2020-11-24 ENCOUNTER — Other Ambulatory Visit: Payer: Self-pay | Admitting: Oncology

## 2020-11-24 ENCOUNTER — Ambulatory Visit (HOSPITAL_COMMUNITY): Payer: Medicare Other

## 2020-11-24 ENCOUNTER — Encounter (HOSPITAL_COMMUNITY): Payer: Self-pay

## 2020-11-26 NOTE — Progress Notes (Signed)
Ellen Henri is non-preferred. Orders changed to Ziextenzo per patient insurance preferred G-CSF effective Jan 2022.  B. Corey Skains, PharmD, BCPS, BCOP

## 2020-11-28 ENCOUNTER — Telehealth: Payer: Self-pay | Admitting: Oncology

## 2020-11-28 NOTE — Telephone Encounter (Signed)
R/s appointments per weather. Spoke to patient who is aware of updated appointments times.

## 2020-11-29 ENCOUNTER — Other Ambulatory Visit: Payer: Self-pay | Admitting: Oncology

## 2020-11-29 DIAGNOSIS — C250 Malignant neoplasm of head of pancreas: Secondary | ICD-10-CM

## 2020-12-01 ENCOUNTER — Inpatient Hospital Stay: Payer: Medicare Other

## 2020-12-01 ENCOUNTER — Inpatient Hospital Stay (HOSPITAL_BASED_OUTPATIENT_CLINIC_OR_DEPARTMENT_OTHER): Payer: Medicare Other | Admitting: Nurse Practitioner

## 2020-12-01 ENCOUNTER — Other Ambulatory Visit: Payer: Self-pay

## 2020-12-01 ENCOUNTER — Inpatient Hospital Stay: Payer: Medicare Other | Attending: Genetic Counselor

## 2020-12-01 ENCOUNTER — Encounter: Payer: Self-pay | Admitting: Nurse Practitioner

## 2020-12-01 ENCOUNTER — Inpatient Hospital Stay: Payer: Medicare Other | Admitting: Oncology

## 2020-12-01 VITALS — BP 109/57 | HR 83 | Temp 98.1°F | Resp 18 | Ht 69.0 in | Wt 152.1 lb

## 2020-12-01 DIAGNOSIS — Z95828 Presence of other vascular implants and grafts: Secondary | ICD-10-CM

## 2020-12-01 DIAGNOSIS — I10 Essential (primary) hypertension: Secondary | ICD-10-CM | POA: Diagnosis not present

## 2020-12-01 DIAGNOSIS — C250 Malignant neoplasm of head of pancreas: Secondary | ICD-10-CM

## 2020-12-01 DIAGNOSIS — D696 Thrombocytopenia, unspecified: Secondary | ICD-10-CM | POA: Insufficient documentation

## 2020-12-01 DIAGNOSIS — Z79899 Other long term (current) drug therapy: Secondary | ICD-10-CM | POA: Insufficient documentation

## 2020-12-01 DIAGNOSIS — Z5189 Encounter for other specified aftercare: Secondary | ICD-10-CM | POA: Diagnosis not present

## 2020-12-01 DIAGNOSIS — C259 Malignant neoplasm of pancreas, unspecified: Secondary | ICD-10-CM | POA: Insufficient documentation

## 2020-12-01 DIAGNOSIS — M549 Dorsalgia, unspecified: Secondary | ICD-10-CM | POA: Insufficient documentation

## 2020-12-01 DIAGNOSIS — E119 Type 2 diabetes mellitus without complications: Secondary | ICD-10-CM | POA: Insufficient documentation

## 2020-12-01 DIAGNOSIS — Z5111 Encounter for antineoplastic chemotherapy: Secondary | ICD-10-CM | POA: Diagnosis not present

## 2020-12-01 LAB — CBC WITH DIFFERENTIAL (CANCER CENTER ONLY)
Abs Immature Granulocytes: 0.04 10*3/uL (ref 0.00–0.07)
Basophils Absolute: 0 10*3/uL (ref 0.0–0.1)
Basophils Relative: 0 %
Eosinophils Absolute: 0.1 10*3/uL (ref 0.0–0.5)
Eosinophils Relative: 1 %
HCT: 30.1 % — ABNORMAL LOW (ref 39.0–52.0)
Hemoglobin: 10 g/dL — ABNORMAL LOW (ref 13.0–17.0)
Immature Granulocytes: 1 %
Lymphocytes Relative: 8 %
Lymphs Abs: 0.6 10*3/uL — ABNORMAL LOW (ref 0.7–4.0)
MCH: 32.6 pg (ref 26.0–34.0)
MCHC: 33.2 g/dL (ref 30.0–36.0)
MCV: 98 fL (ref 80.0–100.0)
Monocytes Absolute: 0.4 10*3/uL (ref 0.1–1.0)
Monocytes Relative: 5 %
Neutro Abs: 7.4 10*3/uL (ref 1.7–7.7)
Neutrophils Relative %: 85 %
Platelet Count: 69 10*3/uL — ABNORMAL LOW (ref 150–400)
RBC: 3.07 MIL/uL — ABNORMAL LOW (ref 4.22–5.81)
RDW: 14.1 % (ref 11.5–15.5)
WBC Count: 8.6 10*3/uL (ref 4.0–10.5)
nRBC: 0 % (ref 0.0–0.2)

## 2020-12-01 LAB — CMP (CANCER CENTER ONLY)
ALT: 59 U/L — ABNORMAL HIGH (ref 0–44)
AST: 45 U/L — ABNORMAL HIGH (ref 15–41)
Albumin: 3.6 g/dL (ref 3.5–5.0)
Alkaline Phosphatase: 195 U/L — ABNORMAL HIGH (ref 38–126)
Anion gap: 9 (ref 5–15)
BUN: 14 mg/dL (ref 8–23)
CO2: 23 mmol/L (ref 22–32)
Calcium: 8.9 mg/dL (ref 8.9–10.3)
Chloride: 105 mmol/L (ref 98–111)
Creatinine: 0.89 mg/dL (ref 0.61–1.24)
GFR, Estimated: >60 mL/min (ref >60)
Glucose, Bld: 210 mg/dL — ABNORMAL HIGH (ref 70–99)
Potassium: 4.2 mmol/L (ref 3.5–5.1)
Sodium: 137 mmol/L (ref 135–145)
Total Bilirubin: 0.8 mg/dL (ref 0.3–1.2)
Total Protein: 6.8 g/dL (ref 6.5–8.1)

## 2020-12-01 LAB — MAGNESIUM: Magnesium: 1.8 mg/dL (ref 1.7–2.4)

## 2020-12-01 MED ORDER — LEUCOVORIN CALCIUM INJECTION 350 MG
300.0000 mg/m2 | Freq: Once | INTRAVENOUS | Status: AC
  Administered 2020-12-01: 562 mg via INTRAVENOUS
  Filled 2020-12-01: qty 28.1

## 2020-12-01 MED ORDER — DEXTROSE 5 % IV SOLN
Freq: Once | INTRAVENOUS | Status: AC
  Filled 2020-12-01: qty 250

## 2020-12-01 MED ORDER — SODIUM CHLORIDE 0.9% FLUSH
10.0000 mL | INTRAVENOUS | Status: DC | PRN
  Administered 2020-12-01: 10 mL
  Filled 2020-12-01: qty 10

## 2020-12-01 MED ORDER — PALONOSETRON HCL INJECTION 0.25 MG/5ML
INTRAVENOUS | Status: AC
  Filled 2020-12-01: qty 5

## 2020-12-01 MED ORDER — SODIUM CHLORIDE 0.9 % IV SOLN
10.0000 mg | Freq: Once | INTRAVENOUS | Status: AC
  Administered 2020-12-01: 10 mg via INTRAVENOUS
  Filled 2020-12-01: qty 10

## 2020-12-01 MED ORDER — PALONOSETRON HCL INJECTION 0.25 MG/5ML
0.2500 mg | Freq: Once | INTRAVENOUS | Status: AC
  Administered 2020-12-01: 0.25 mg via INTRAVENOUS

## 2020-12-01 MED ORDER — SODIUM CHLORIDE 0.9 % IV SOLN
2000.0000 mg/m2 | INTRAVENOUS | Status: DC
  Administered 2020-12-01: 3750 mg via INTRAVENOUS
  Filled 2020-12-01: qty 75

## 2020-12-01 MED ORDER — OXALIPLATIN CHEMO INJECTION 100 MG/20ML
50.0000 mg/m2 | Freq: Once | INTRAVENOUS | Status: AC
  Administered 2020-12-01: 95 mg via INTRAVENOUS
  Filled 2020-12-01: qty 19

## 2020-12-01 NOTE — Patient Instructions (Signed)
[facility] Discharge Instructions for Patients Receiving Chemotherapy  Today you received the following chemotherapy agents oxaliplatin, leucovorin, fluorourcil.  To help prevent nausea and vomiting after your treatment, we encourage you to take your nausea medication as directed.   If you develop nausea and vomiting that is not controlled by your nausea medication, call the clinic.   BELOW ARE SYMPTOMS THAT SHOULD BE REPORTED IMMEDIATELY:  *FEVER GREATER THAN 100.5 F  *CHILLS WITH OR WITHOUT FEVER  NAUSEA AND VOMITING THAT IS NOT CONTROLLED WITH YOUR NAUSEA MEDICATION  *UNUSUAL SHORTNESS OF BREATH  *UNUSUAL BRUISING OR BLEEDING  TENDERNESS IN MOUTH AND THROAT WITH OR WITHOUT PRESENCE OF ULCERS  *URINARY PROBLEMS  *BOWEL PROBLEMS  UNUSUAL RASH Items with * indicate a potential emergency and should be followed up as soon as possible.  Feel free to call the clinic should you have any questions or concerns. The clinic phone number is [phone].  Please show the CHEMO ALERT CARD at check-in to the Emergency Department and triage nurse.   

## 2020-12-01 NOTE — Progress Notes (Signed)
  [facility] OFFICE PROGRESS NOTE   Diagnosis: Pancreas cancer  INTERVAL HISTORY:   [name] returns as scheduled.  He completed cycle 6 FOLFOX 11/10/2020.  Denies nausea/vomiting.  No mouth sores.  No change in baseline intermittent loose stools.  He reports a good appetite.  No significant pain.  He does not typically experience cold sensitivity.  No numbness or tingling in the hands or feet.  Objective:  Vital signs in last 24 hours:  Blood pressure (!) 109/57, pulse 83, temperature 98.1 F (36.7 C), temperature source Tympanic, resp. rate 18, height _0  (1.753 m), weight 152 lb 1.6 oz (69 kg), SpO2 100 %.    HEENT: No thrush or ulcers. Resp: Lungs clear bilaterally. Cardio: Regular rate and rhythm. GI: Abdomen soft and nontender.  No hepatomegaly. Vascular: No leg edema. Neuro: Vibratory sense very minimally decreased to intact over the fingertips per tuning fork exam. Skin: Palms without erythema. Port-A-Cath without erythema.   Lab Results:  Lab Results  Component Value Date   WBC 8.6 12/01/2020   HGB 10.0 (L) 12/01/2020   HCT 30.1 (L) 12/01/2020   MCV 98.0 12/01/2020   PLT 69 (L) 12/01/2020   NEUTROABS 7.4 12/01/2020    Imaging:  No results found.  Medications: I have reviewed the patient's current medications.  Assessment/Plan: 1. Pancreas cancer ? CT urology center 03/04/2020-haziness of the fat adjacent to the celiac axis and SMA with focal narrowing of the SMV, splenic vein, and splenoportal confluence ? 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 EUS 07/10/2020-no pancreas mass  identified, 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, 1.5 cm aortocaval node biopsy-scant lymphoid material, no malignancy ? Diagnostic laparoscopy 07/17/2020-segment 3 and 4 liver nodules,adenocarcinoma, positive for pankeratin, CK7 and CK20, partially positive for TTF-1, negative for CDX2 ? Foundation 1-microsatellite stable, tumor mutation burden 4, K-ras G12D, subclonal RB1 alteration ? Elevated CA 19-9 ? Cycle 1 FOLFOX 07/28/2020 ? Cycle 2 FOLFOX 08/18/2020,oxaliplatin dose reduced due to thrombocytopenia ? Cycle 3 FOLFOX 09/08/2020 ? Cycle 4 FOLFOX 09/29/2020 ? Cycle 5 FOLFOX 10/20/2020 ? CTs 11/06/2020-grossly stable infiltrative mass within the pancreatic head and porta hepatis.  New small low-density liver lesions.  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 ? Cycle 6 FOLFOX 11/10/2021  ? Cycle 7 FOLFOX 12/01/2020  2. Chronic thrombocytopenia 3. Diabetes 4. Hypertension 5. Abdomen/back pain secondary to #1   Disposition: Aaron Johnston appears stable.  He has completed 6 cycles of FOLFOX.  Plan to proceed with cycle 7 today as scheduled.  Follow-up MRI of the liver later this week.  We reviewed the CBC from today.  Counts adequate to proceed with treatment.  He has stable moderate thrombocytopenia.  He understands to contact the office with bleeding.  He will return for lab, follow-up, FOLFOX in 3 weeks.  He will contact the office in the interim as outlined above or with any other problems.    [name] ANP/GNP-BC   12/01/2020  11:45 AM

## 2020-12-02 ENCOUNTER — Telehealth: Payer: Self-pay | Admitting: Nurse Practitioner

## 2020-12-02 ENCOUNTER — Ambulatory Visit (HOSPITAL_COMMUNITY): Payer: Medicare Other

## 2020-12-02 LAB — CANCER ANTIGEN 19-9: CA 19-9: 480 U/mL — ABNORMAL HIGH (ref 0–35)

## 2020-12-02 NOTE — Telephone Encounter (Signed)
Scheduled appointments per 1/17 los. Spoke to patient who is aware of appointments dates and times.

## 2020-12-03 ENCOUNTER — Other Ambulatory Visit: Payer: Self-pay

## 2020-12-03 ENCOUNTER — Inpatient Hospital Stay: Payer: Medicare Other

## 2020-12-03 VITALS — BP 111/62 | HR 77 | Resp 18

## 2020-12-03 DIAGNOSIS — C250 Malignant neoplasm of head of pancreas: Secondary | ICD-10-CM

## 2020-12-03 DIAGNOSIS — C259 Malignant neoplasm of pancreas, unspecified: Secondary | ICD-10-CM | POA: Diagnosis not present

## 2020-12-03 MED ORDER — PEGFILGRASTIM-BMEZ 6 MG/0.6ML ~~LOC~~ SOSY
PREFILLED_SYRINGE | SUBCUTANEOUS | Status: AC
  Filled 2020-12-03: qty 0.6

## 2020-12-03 MED ORDER — SODIUM CHLORIDE 0.9% FLUSH
10.0000 mL | INTRAVENOUS | Status: DC | PRN
  Administered 2020-12-03: 10 mL
  Filled 2020-12-03: qty 10

## 2020-12-03 MED ORDER — PEGFILGRASTIM-BMEZ 6 MG/0.6ML ~~LOC~~ SOSY
6.0000 mg | PREFILLED_SYRINGE | Freq: Once | SUBCUTANEOUS | Status: AC
  Administered 2020-12-03: 6 mg via SUBCUTANEOUS

## 2020-12-03 MED ORDER — HEPARIN SOD (PORK) LOCK FLUSH 100 UNIT/ML IV SOLN
500.0000 [IU] | Freq: Once | INTRAVENOUS | Status: AC | PRN
  Administered 2020-12-03: 500 [IU]
  Filled 2020-12-03: qty 5

## 2020-12-04 ENCOUNTER — Ambulatory Visit (HOSPITAL_COMMUNITY)
Admission: RE | Admit: 2020-12-04 | Discharge: 2020-12-04 | Disposition: A | Discharge status: Home / Self Care | Payer: Medicare Other | Source: Ambulatory Visit | Attending: Oncology | Admitting: Oncology

## 2020-12-04 ENCOUNTER — Ambulatory Visit (HOSPITAL_COMMUNITY): Admission: RE | Admit: 2020-12-04 | Payer: Medicare Other | Source: Ambulatory Visit

## 2020-12-04 DIAGNOSIS — C250 Malignant neoplasm of head of pancreas: Secondary | ICD-10-CM

## 2020-12-04 MED ORDER — GADOBUTROL 1 MMOL/ML IV SOLN
7.0000 mL | Freq: Once | INTRAVENOUS | Status: AC | PRN
  Administered 2020-12-04: 7 mL via INTRAVENOUS

## 2020-12-22 ENCOUNTER — Inpatient Hospital Stay (HOSPITAL_BASED_OUTPATIENT_CLINIC_OR_DEPARTMENT_OTHER): Payer: Medicare Other | Admitting: Nurse Practitioner

## 2020-12-22 ENCOUNTER — Other Ambulatory Visit: Payer: Self-pay

## 2020-12-22 ENCOUNTER — Inpatient Hospital Stay: Payer: Medicare Other

## 2020-12-22 ENCOUNTER — Inpatient Hospital Stay: Payer: Medicare Other | Attending: Genetic Counselor

## 2020-12-22 ENCOUNTER — Encounter: Payer: Self-pay | Admitting: Nurse Practitioner

## 2020-12-22 VITALS — BP 117/69 | HR 71 | Temp 97.4°F | Resp 18 | Ht 69.0 in | Wt 148.6 lb

## 2020-12-22 DIAGNOSIS — M549 Dorsalgia, unspecified: Secondary | ICD-10-CM | POA: Insufficient documentation

## 2020-12-22 DIAGNOSIS — C250 Malignant neoplasm of head of pancreas: Secondary | ICD-10-CM

## 2020-12-22 DIAGNOSIS — G893 Neoplasm related pain (acute) (chronic): Secondary | ICD-10-CM | POA: Diagnosis not present

## 2020-12-22 DIAGNOSIS — Z5111 Encounter for antineoplastic chemotherapy: Secondary | ICD-10-CM | POA: Insufficient documentation

## 2020-12-22 DIAGNOSIS — R42 Dizziness and giddiness: Secondary | ICD-10-CM | POA: Insufficient documentation

## 2020-12-22 DIAGNOSIS — R634 Abnormal weight loss: Secondary | ICD-10-CM | POA: Diagnosis not present

## 2020-12-22 DIAGNOSIS — D696 Thrombocytopenia, unspecified: Secondary | ICD-10-CM | POA: Insufficient documentation

## 2020-12-22 DIAGNOSIS — I10 Essential (primary) hypertension: Secondary | ICD-10-CM | POA: Diagnosis not present

## 2020-12-22 DIAGNOSIS — C259 Malignant neoplasm of pancreas, unspecified: Secondary | ICD-10-CM | POA: Diagnosis present

## 2020-12-22 DIAGNOSIS — E119 Type 2 diabetes mellitus without complications: Secondary | ICD-10-CM | POA: Diagnosis not present

## 2020-12-22 DIAGNOSIS — Z95828 Presence of other vascular implants and grafts: Secondary | ICD-10-CM

## 2020-12-22 DIAGNOSIS — R197 Diarrhea, unspecified: Secondary | ICD-10-CM | POA: Insufficient documentation

## 2020-12-22 LAB — CBC WITH DIFFERENTIAL (CANCER CENTER ONLY)
Abs Immature Granulocytes: 0.07 10*3/uL (ref 0.00–0.07)
Basophils Absolute: 0 10*3/uL (ref 0.0–0.1)
Basophils Relative: 0 %
Eosinophils Absolute: 0.1 10*3/uL (ref 0.0–0.5)
Eosinophils Relative: 2 %
HCT: 29.4 % — ABNORMAL LOW (ref 39.0–52.0)
Hemoglobin: 9.5 g/dL — ABNORMAL LOW (ref 13.0–17.0)
Immature Granulocytes: 2 %
Lymphocytes Relative: 15 %
Lymphs Abs: 0.7 10*3/uL (ref 0.7–4.0)
MCH: 32.1 pg (ref 26.0–34.0)
MCHC: 32.3 g/dL (ref 30.0–36.0)
MCV: 99.3 fL (ref 80.0–100.0)
Monocytes Absolute: 0.4 10*3/uL (ref 0.1–1.0)
Monocytes Relative: 9 %
Neutro Abs: 3.2 10*3/uL (ref 1.7–7.7)
Neutrophils Relative %: 72 %
Platelet Count: 54 10*3/uL — ABNORMAL LOW (ref 150–400)
RBC: 2.96 MIL/uL — ABNORMAL LOW (ref 4.22–5.81)
RDW: 13.9 % (ref 11.5–15.5)
WBC Count: 4.5 10*3/uL (ref 4.0–10.5)
nRBC: 0 % (ref 0.0–0.2)

## 2020-12-22 LAB — CMP (CANCER CENTER ONLY)
ALT: 37 U/L (ref 0–44)
AST: 30 U/L (ref 15–41)
Albumin: 3.9 g/dL (ref 3.5–5.0)
Alkaline Phosphatase: 185 U/L — ABNORMAL HIGH (ref 38–126)
Anion gap: 7 (ref 5–15)
BUN: 20 mg/dL (ref 8–23)
CO2: 21 mmol/L — ABNORMAL LOW (ref 22–32)
Calcium: 9 mg/dL (ref 8.9–10.3)
Chloride: 109 mmol/L (ref 98–111)
Creatinine: 0.87 mg/dL (ref 0.61–1.24)
GFR, Estimated: >60 mL/min (ref >60)
Glucose, Bld: 120 mg/dL — ABNORMAL HIGH (ref 70–99)
Potassium: 4.3 mmol/L (ref 3.5–5.1)
Sodium: 137 mmol/L (ref 135–145)
Total Bilirubin: 1 mg/dL (ref 0.3–1.2)
Total Protein: 6.7 g/dL (ref 6.5–8.1)

## 2020-12-22 MED ORDER — PALONOSETRON HCL INJECTION 0.25 MG/5ML
INTRAVENOUS | Status: AC
  Filled 2020-12-22: qty 5

## 2020-12-22 MED ORDER — DEXTROSE 5 % IV SOLN
Freq: Once | INTRAVENOUS | Status: AC
Start: 2020-12-22 — End: 2020-12-22
  Filled 2020-12-22: qty 250

## 2020-12-22 MED ORDER — LEUCOVORIN CALCIUM INJECTION 350 MG
562.0000 mg | Freq: Once | INTRAVENOUS | Status: AC
  Administered 2020-12-22: 562 mg via INTRAVENOUS
  Filled 2020-12-22: qty 28.1

## 2020-12-22 MED ORDER — HEPARIN SOD (PORK) LOCK FLUSH 100 UNIT/ML IV SOLN
500.0000 [IU] | Freq: Once | INTRAVENOUS | Status: DC | PRN
  Filled 2020-12-22: qty 5

## 2020-12-22 MED ORDER — SODIUM CHLORIDE 0.9% FLUSH
10.0000 mL | INTRAVENOUS | Status: DC | PRN
  Filled 2020-12-22: qty 10

## 2020-12-22 MED ORDER — SODIUM CHLORIDE 0.9% FLUSH
10.0000 mL | INTRAVENOUS | Status: DC | PRN
  Administered 2020-12-22: 10 mL
  Filled 2020-12-22: qty 10

## 2020-12-22 MED ORDER — OXALIPLATIN CHEMO INJECTION 100 MG/20ML
95.0000 mg | Freq: Once | INTRAVENOUS | Status: AC
  Administered 2020-12-22: 95 mg via INTRAVENOUS
  Filled 2020-12-22: qty 19

## 2020-12-22 MED ORDER — SODIUM CHLORIDE 0.9 % IV SOLN
10.0000 mg | Freq: Once | INTRAVENOUS | Status: AC
  Administered 2020-12-22: 10 mg via INTRAVENOUS
  Filled 2020-12-22: qty 10

## 2020-12-22 MED ORDER — PALONOSETRON HCL INJECTION 0.25 MG/5ML
0.2500 mg | Freq: Once | INTRAVENOUS | Status: AC
  Administered 2020-12-22: 0.25 mg via INTRAVENOUS

## 2020-12-22 MED ORDER — SODIUM CHLORIDE 0.9 % IV SOLN
3750.0000 mg | INTRAVENOUS | Status: DC
  Administered 2020-12-22: 3750 mg via INTRAVENOUS
  Filled 2020-12-22: qty 75

## 2020-12-22 NOTE — Patient Instructions (Signed)

## 2020-12-22 NOTE — Progress Notes (Addendum)
[facility] OFFICE PROGRESS NOTE   Diagnosis: Pancreas cancer  INTERVAL HISTORY:   [name] returns as scheduled.  He completed cycle 7 FOLFOX 12/01/2020.  He denies nausea/vomiting.  No mouth sores.  No change in baseline loose stools which tend to happen after eating.  He does not take pancreatic enzyme replacement consistently.  He does not experience cold sensitivity following treatment.  No numbness or tingling in the hands or feet.  He denies abdominal pain.  He reports a good appetite.  He intermittently feels lightheaded with standing and wonders if he still needs blood pressure medication.  Objective:  Vital signs in last 24 hours:  Blood pressure 117/69, pulse 71, temperature (!) 97.4 F (36.3 C), temperature source Tympanic, resp. rate 18, height '5\' 9"'  (1.753 m), weight 148 lb 9.6 oz (67.4 kg), SpO2 99 %.    HEENT: No thrush or ulcers. Resp: Lungs clear bilaterally. Cardio: Regular rate and rhythm. GI: No hepatomegaly. Vascular: No leg edema. Neuro: Vibratory sense mildly decreased over the fingertips per tuning fork exam. Skin: Palms with hyperpigmentation. Port-A-Cath without erythema.   Lab Results:  Lab Results  Component Value Date   WBC 8.6 12/01/2020   HGB 10.0 (L) 12/01/2020   HCT 30.1 (L) 12/01/2020   MCV 98.0 12/01/2020   PLT 69 (L) 12/01/2020   NEUTROABS 7.4 12/01/2020    Imaging:  No results found.  Medications: I have reviewed the patient's current medications.  Assessment/Plan: 1. Pancreas cancer ? CT urology center 03/04/2020-haziness of the fat adjacent to the celiac axis and SMA with focal narrowing of the SMV, splenic vein, and splenoportal confluence ? CT 07/07/2020-infiltrative retroperitoneal mass with encasement of the celiac axis and SMA, high-grade narrowing of the proximal portal vein with cavernous transformation, nonocclusive thrombus within the main portal vein, mild biliary ductal dilatation, diffuse  hypodense/hypoenhancing areas within the liver (edema versus infiltrating neoplasm), 2 x 1.7 cm area of focal prominence in the pancreas ? EUS 07/10/2020-no pancreas mass identified, 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, 1.5 cm aortocaval node biopsy-scant lymphoid material, no malignancy ? Diagnostic laparoscopy 07/17/2020-segment 3 and 4 liver nodules,adenocarcinoma, positive for pankeratin, CK7 and CK20, partially positive for TTF-1, negative for CDX2 ? Foundation 1-microsatellite stable, tumor mutation burden 4, K-ras G12D, subclonal RB1 alteration ? Elevated CA 19-9 ? Cycle 1 FOLFOX 07/28/2020 ? Cycle 2 FOLFOX 08/18/2020,oxaliplatin dose reduced due to thrombocytopenia ? Cycle 3 FOLFOX 09/08/2020 ? Cycle 4 FOLFOX 09/29/2020 ? Cycle 5 FOLFOX 10/20/2020 ? CTs 11/06/2020-grossly stable infiltrative mass within the pancreatic head and porta hepatis. New small low-density liver lesions. 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 ? Cycle 6 FOLFOX 11/10/2021 ? Cycle 7 FOLFOX 12/01/2020 ? MRI liver 12/04/2020-no significant change in posttreatment appearance of the pancreatic head.  Numerous intrinsically low signal hypoenhancing lesions of the liver parenchyma predominantly observed in the right lobe of the liver and liver dome, measuring no greater than 8 mm and as seen on recent prior CT of the abdomen/pelvis. ? Cycle 8 FOLFOX 12/22/2020  2. Chronic thrombocytopenia 3. Diabetes 4. Hypertension 5. Abdomen/back pain secondary to #1   Disposition: Mr. Bloodgood appears unchanged.  He has completed 7 cycles of FOLFOX.  Plan to proceed with cycle 8 today as scheduled.  Restaging CTs after cycle 10.  We reviewed the CBC from today.  Counts adequate to proceed with treatment.  He has persistent moderate thrombocytopenia.  He will contact the office with bleeding.  He  will return for a follow-up CBC on 01/02/2021.  He will discontinue ramipril.  He will return for lab, follow-up, cycle 9 FOLFOX in 3 weeks.  He will contact the office in the interim as outlined above or with any other problems.  Patient seen with Dr. Benay Spice.    [name] ANP/GNP-BC   12/22/2020  10:24 AM  This was a shared visit with [name].  Mr. Ager appears stable.  We reviewed results of the restaging abdomen MRI with Mr. Primmer and his wife.  I recommend continuing FOLFOX.  He has chronic thrombocytopenia.  He will call for bleeding.  He reports orthostatic symptoms.  He has lost a significant amount of weight since he is diagnosed with pancreas cancer and his blood pressure has been running low.  He will discontinue ramipril.  I was present for greater than 50% of today's visit.  I performed medical decision making.  [name], MD

## 2020-12-22 NOTE — Progress Notes (Signed)
OK to treat per [name]' NP note from today: We reviewed the CBC from today.  Counts adequate to proceed with treatment.  He has persistent moderate thrombocytopenia.  He will contact the office with bleeding.  He will return for a follow-up CBC on 01/02/2021.

## 2020-12-22 NOTE — Patient Instructions (Signed)
[facility] Discharge Instructions for Patients Receiving Chemotherapy  Today you received the following chemotherapy agents oxaliplatin, leucovorin, fluorourcil.  To help prevent nausea and vomiting after your treatment, we encourage you to take your nausea medication as directed.   If you develop nausea and vomiting that is not controlled by your nausea medication, call the clinic.   BELOW ARE SYMPTOMS THAT SHOULD BE REPORTED IMMEDIATELY:  *FEVER GREATER THAN 100.5 F  *CHILLS WITH OR WITHOUT FEVER  NAUSEA AND VOMITING THAT IS NOT CONTROLLED WITH YOUR NAUSEA MEDICATION  *UNUSUAL SHORTNESS OF BREATH  *UNUSUAL BRUISING OR BLEEDING  TENDERNESS IN MOUTH AND THROAT WITH OR WITHOUT PRESENCE OF ULCERS  *URINARY PROBLEMS  *BOWEL PROBLEMS  UNUSUAL RASH Items with * indicate a potential emergency and should be followed up as soon as possible.  Feel free to call the clinic should you have any questions or concerns. The clinic phone number is [phone].  Please show the CHEMO ALERT CARD at check-in to the Emergency Department and triage nurse.   

## 2020-12-23 ENCOUNTER — Telehealth: Payer: Self-pay | Admitting: Nurse Practitioner

## 2020-12-23 LAB — CANCER ANTIGEN 19-9: CA 19-9: 520 U/mL — ABNORMAL HIGH (ref 0–35)

## 2020-12-23 NOTE — Telephone Encounter (Signed)
Scheduled appointments per 2/7 los. Spoke to patient's wife who is aware of appointments dates and times.

## 2020-12-24 ENCOUNTER — Other Ambulatory Visit: Payer: Self-pay

## 2020-12-24 ENCOUNTER — Inpatient Hospital Stay: Payer: Medicare Other

## 2020-12-24 VITALS — BP 116/68 | HR 74 | Temp 98.8°F | Resp 18

## 2020-12-24 DIAGNOSIS — C250 Malignant neoplasm of head of pancreas: Secondary | ICD-10-CM

## 2020-12-24 DIAGNOSIS — C259 Malignant neoplasm of pancreas, unspecified: Secondary | ICD-10-CM | POA: Diagnosis not present

## 2020-12-24 MED ORDER — PEGFILGRASTIM-BMEZ 6 MG/0.6ML ~~LOC~~ SOSY
PREFILLED_SYRINGE | SUBCUTANEOUS | Status: AC
  Filled 2020-12-24: qty 0.6

## 2020-12-24 MED ORDER — SODIUM CHLORIDE 0.9% FLUSH
10.0000 mL | INTRAVENOUS | Status: DC | PRN
  Administered 2020-12-24: 10 mL
  Filled 2020-12-24: qty 10

## 2020-12-24 MED ORDER — HEPARIN SOD (PORK) LOCK FLUSH 100 UNIT/ML IV SOLN
500.0000 [IU] | Freq: Once | INTRAVENOUS | Status: AC | PRN
  Administered 2020-12-24: 500 [IU]
  Filled 2020-12-24: qty 5

## 2020-12-24 MED ORDER — PEGFILGRASTIM-BMEZ 6 MG/0.6ML ~~LOC~~ SOSY
6.0000 mg | PREFILLED_SYRINGE | Freq: Once | SUBCUTANEOUS | Status: AC
  Administered 2020-12-24: 6 mg via SUBCUTANEOUS

## 2020-12-24 NOTE — Patient Instructions (Signed)
Pegfilgrastim injection What is this medicine? PEGFILGRASTIM (PEG fil gra stim) is a long-acting granulocyte colony-stimulating factor that stimulates the growth of neutrophils, a type of white blood cell important in the body's fight against infection. It is used to reduce the incidence of fever and infection in patients with certain types of cancer who are receiving chemotherapy that affects the bone marrow, and to increase survival after being exposed to high doses of radiation. This medicine may be used for other purposes; ask your health care provider or pharmacist if you have questions. COMMON BRAND NAME(S): Fulphila, Neulasta, Nyvepria, UDENYCA, Ziextenzo What should I tell my health care provider before I take this medicine? They need to know if you have any of these conditions:  kidney disease  latex allergy  ongoing radiation therapy  sickle cell disease  skin reactions to acrylic adhesives (On-Body Injector only)  an unusual or allergic reaction to pegfilgrastim, filgrastim, other medicines, foods, dyes, or preservatives  pregnant or trying to get pregnant  breast-feeding How should I use this medicine? This medicine is for injection under the skin. If you get this medicine at home, you will be taught how to prepare and give the pre-filled syringe or how to use the On-body Injector. Refer to the patient Instructions for Use for detailed instructions. Use exactly as directed. Tell your healthcare provider immediately if you suspect that the On-body Injector may not have performed as intended or if you suspect the use of the On-body Injector resulted in a missed or partial dose. It is important that you put your used needles and syringes in a special sharps container. Do not put them in a trash can. If you do not have a sharps container, call your pharmacist or healthcare provider to get one. Talk to your pediatrician regarding the use of this medicine in children. While this drug  may be prescribed for selected conditions, precautions do apply. Overdosage: If you think you have taken too much of this medicine contact a poison control center or emergency room at once. NOTE: This medicine is only for you. Do not share this medicine with others. What if I miss a dose? It is important not to miss your dose. Call your doctor or health care professional if you miss your dose. If you miss a dose due to an On-body Injector failure or leakage, a new dose should be administered as soon as possible using a single prefilled syringe for manual use. What may interact with this medicine? Interactions have not been studied. This list may not describe all possible interactions. Give your health care provider a list of all the medicines, herbs, non-prescription drugs, or dietary supplements you use. Also tell them if you smoke, drink alcohol, or use illegal drugs. Some items may interact with your medicine. What should I watch for while using this medicine? Your condition will be monitored carefully while you are receiving this medicine. You may need blood work done while you are taking this medicine. Talk to your health care provider about your risk of cancer. You may be more at risk for certain types of cancer if you take this medicine. If you are going to need a MRI, CT scan, or other procedure, tell your doctor that you are using this medicine (On-Body Injector only). What side effects may I notice from receiving this medicine? Side effects that you should report to your doctor or health care professional as soon as possible:  allergic reactions (skin rash, itching or hives, swelling of   the face, lips, or tongue)  back pain  dizziness  fever  pain, redness, or irritation at site where injected  pinpoint red spots on the skin  red or dark-brown urine  shortness of breath or breathing problems  stomach or side pain, or pain at the shoulder  swelling  tiredness  trouble  passing urine or change in the amount of urine  unusual bruising or bleeding Side effects that usually do not require medical attention (report to your doctor or health care professional if they continue or are bothersome):  bone pain  muscle pain This list may not describe all possible side effects. Call your doctor for medical advice about side effects. You may report side effects to FDA at 1-800-FDA-1088. Where should I keep my medicine? Keep out of the reach of children. If you are using this medicine at home, you will be instructed on how to store it. Throw away any unused medicine after the expiration date on the label. NOTE: This sheet is a summary. It may not cover all possible information. If you have questions about this medicine, talk to your doctor, pharmacist, or health care provider.  2021 Elsevier/Gold Standard (2019-11-23 13:20:51)  

## 2020-12-28 ENCOUNTER — Other Ambulatory Visit: Payer: Self-pay | Admitting: Oncology

## 2020-12-28 DIAGNOSIS — C250 Malignant neoplasm of head of pancreas: Secondary | ICD-10-CM

## 2021-01-02 ENCOUNTER — Inpatient Hospital Stay: Payer: Medicare Other

## 2021-01-02 ENCOUNTER — Other Ambulatory Visit: Payer: Self-pay

## 2021-01-02 DIAGNOSIS — C250 Malignant neoplasm of head of pancreas: Secondary | ICD-10-CM

## 2021-01-02 DIAGNOSIS — C259 Malignant neoplasm of pancreas, unspecified: Secondary | ICD-10-CM | POA: Diagnosis not present

## 2021-01-02 LAB — CBC WITH DIFFERENTIAL (CANCER CENTER ONLY)
Abs Immature Granulocytes: 0.19 10*3/uL — ABNORMAL HIGH (ref 0.00–0.07)
Basophils Absolute: 0 10*3/uL (ref 0.0–0.1)
Basophils Relative: 0 %
Eosinophils Absolute: 0.1 10*3/uL (ref 0.0–0.5)
Eosinophils Relative: 1 %
HCT: 32.6 % — ABNORMAL LOW (ref 39.0–52.0)
Hemoglobin: 10.5 g/dL — ABNORMAL LOW (ref 13.0–17.0)
Immature Granulocytes: 2 %
Lymphocytes Relative: 8 %
Lymphs Abs: 1 10*3/uL (ref 0.7–4.0)
MCH: 32.5 pg (ref 26.0–34.0)
MCHC: 32.2 g/dL (ref 30.0–36.0)
MCV: 100.9 fL — ABNORMAL HIGH (ref 80.0–100.0)
Monocytes Absolute: 0.7 10*3/uL (ref 0.1–1.0)
Monocytes Relative: 6 %
Neutro Abs: 10.5 10*3/uL — ABNORMAL HIGH (ref 1.7–7.7)
Neutrophils Relative %: 83 %
Platelet Count: 69 10*3/uL — ABNORMAL LOW (ref 150–400)
RBC: 3.23 MIL/uL — ABNORMAL LOW (ref 4.22–5.81)
RDW: 14.2 % (ref 11.5–15.5)
WBC Count: 12.6 10*3/uL — ABNORMAL HIGH (ref 4.0–10.5)
nRBC: 0 % (ref 0.0–0.2)

## 2021-01-11 ENCOUNTER — Other Ambulatory Visit: Payer: Self-pay | Admitting: Oncology

## 2021-01-12 ENCOUNTER — Inpatient Hospital Stay (HOSPITAL_BASED_OUTPATIENT_CLINIC_OR_DEPARTMENT_OTHER): Payer: Medicare Other | Admitting: Oncology

## 2021-01-12 ENCOUNTER — Inpatient Hospital Stay: Payer: Medicare Other

## 2021-01-12 ENCOUNTER — Other Ambulatory Visit: Payer: Self-pay

## 2021-01-12 ENCOUNTER — Other Ambulatory Visit: Payer: Self-pay | Admitting: *Deleted

## 2021-01-12 VITALS — BP 136/84 | HR 85 | Temp 98.2°F | Resp 20 | Ht 69.0 in | Wt 146.1 lb

## 2021-01-12 DIAGNOSIS — C250 Malignant neoplasm of head of pancreas: Secondary | ICD-10-CM

## 2021-01-12 DIAGNOSIS — Z95828 Presence of other vascular implants and grafts: Secondary | ICD-10-CM

## 2021-01-12 DIAGNOSIS — C259 Malignant neoplasm of pancreas, unspecified: Secondary | ICD-10-CM | POA: Diagnosis not present

## 2021-01-12 LAB — CBC WITH DIFFERENTIAL (CANCER CENTER ONLY)
Abs Immature Granulocytes: 0.04 10*3/uL (ref 0.00–0.07)
Basophils Absolute: 0 10*3/uL (ref 0.0–0.1)
Basophils Relative: 0 %
Eosinophils Absolute: 0.1 10*3/uL (ref 0.0–0.5)
Eosinophils Relative: 1 %
HCT: 31.4 % — ABNORMAL LOW (ref 39.0–52.0)
Hemoglobin: 10.2 g/dL — ABNORMAL LOW (ref 13.0–17.0)
Immature Granulocytes: 0 %
Lymphocytes Relative: 8 %
Lymphs Abs: 0.7 10*3/uL (ref 0.7–4.0)
MCH: 32.3 pg (ref 26.0–34.0)
MCHC: 32.5 g/dL (ref 30.0–36.0)
MCV: 99.4 fL (ref 80.0–100.0)
Monocytes Absolute: 0.4 10*3/uL (ref 0.1–1.0)
Monocytes Relative: 5 %
Neutro Abs: 7.7 10*3/uL (ref 1.7–7.7)
Neutrophils Relative %: 86 %
Platelet Count: 64 10*3/uL — ABNORMAL LOW (ref 150–400)
RBC: 3.16 MIL/uL — ABNORMAL LOW (ref 4.22–5.81)
RDW: 14.3 % (ref 11.5–15.5)
WBC Count: 8.9 10*3/uL (ref 4.0–10.5)
nRBC: 0 % (ref 0.0–0.2)

## 2021-01-12 LAB — CMP (CANCER CENTER ONLY)
ALT: 67 U/L — ABNORMAL HIGH (ref 0–44)
AST: 59 U/L — ABNORMAL HIGH (ref 15–41)
Albumin: 3.8 g/dL (ref 3.5–5.0)
Alkaline Phosphatase: 208 U/L — ABNORMAL HIGH (ref 38–126)
Anion gap: 6 (ref 5–15)
BUN: 14 mg/dL (ref 8–23)
CO2: 24 mmol/L (ref 22–32)
Calcium: 9.2 mg/dL (ref 8.9–10.3)
Chloride: 107 mmol/L (ref 98–111)
Creatinine: 0.83 mg/dL (ref 0.61–1.24)
GFR, Estimated: >60 mL/min (ref >60)
Glucose, Bld: 187 mg/dL — ABNORMAL HIGH (ref 70–99)
Potassium: 4.3 mmol/L (ref 3.5–5.1)
Sodium: 137 mmol/L (ref 135–145)
Total Bilirubin: 0.8 mg/dL (ref 0.3–1.2)
Total Protein: 6.7 g/dL (ref 6.5–8.1)

## 2021-01-12 LAB — MAGNESIUM: Magnesium: 1.8 mg/dL (ref 1.7–2.4)

## 2021-01-12 MED ORDER — MAGNESIUM OXIDE 400 MG PO TABS: 1.0000 | ORAL_TABLET | Freq: Two times a day (BID) | ORAL | 0 refills | Status: DC

## 2021-01-12 MED ORDER — OXALIPLATIN CHEMO INJECTION 100 MG/20ML
50.0000 mg/m2 | Freq: Once | INTRAVENOUS | Status: AC
  Administered 2021-01-12: 90 mg via INTRAVENOUS
  Filled 2021-01-12: qty 18

## 2021-01-12 MED ORDER — PALONOSETRON HCL INJECTION 0.25 MG/5ML
0.2500 mg | Freq: Once | INTRAVENOUS | Status: AC
  Administered 2021-01-12: 0.25 mg via INTRAVENOUS

## 2021-01-12 MED ORDER — LEUCOVORIN CALCIUM INJECTION 350 MG
300.0000 mg/m2 | Freq: Once | INTRAVENOUS | Status: AC
  Administered 2021-01-12: 544 mg via INTRAVENOUS
  Filled 2021-01-12: qty 27.2

## 2021-01-12 MED ORDER — SODIUM CHLORIDE 0.9% FLUSH
10.0000 mL | INTRAVENOUS | Status: DC | PRN
  Administered 2021-01-12: 10 mL
  Filled 2021-01-12: qty 10

## 2021-01-12 MED ORDER — SODIUM CHLORIDE 0.9% FLUSH
10.0000 mL | INTRAVENOUS | Status: DC | PRN
  Filled 2021-01-12: qty 10

## 2021-01-12 MED ORDER — FLUOROURACIL CHEMO INJECTION 5 GM/100ML
2000.0000 mg/m2 | INTRAVENOUS | Status: DC
  Administered 2021-01-12: 3600 mg via INTRAVENOUS
  Filled 2021-01-12: qty 72

## 2021-01-12 MED ORDER — PALONOSETRON HCL INJECTION 0.25 MG/5ML
INTRAVENOUS | Status: AC
  Filled 2021-01-12: qty 5

## 2021-01-12 MED ORDER — DEXTROSE 5 % IV SOLN
Freq: Once | INTRAVENOUS | Status: AC
  Filled 2021-01-12: qty 250

## 2021-01-12 MED ORDER — SODIUM CHLORIDE 0.9 % IV SOLN
10.0000 mg | Freq: Once | INTRAVENOUS | Status: AC
  Administered 2021-01-12: 10 mg via INTRAVENOUS
  Filled 2021-01-12: qty 10

## 2021-01-12 NOTE — Patient Instructions (Signed)
[facility] Discharge Instructions for Patients Receiving Chemotherapy  Today you received the following chemotherapy agents oxaliplatin, leucovorin, fluorourcil.  To help prevent nausea and vomiting after your treatment, we encourage you to take your nausea medication as directed.   If you develop nausea and vomiting that is not controlled by your nausea medication, call the clinic.   BELOW ARE SYMPTOMS THAT SHOULD BE REPORTED IMMEDIATELY:  *FEVER GREATER THAN 100.5 F  *CHILLS WITH OR WITHOUT FEVER  NAUSEA AND VOMITING THAT IS NOT CONTROLLED WITH YOUR NAUSEA MEDICATION  *UNUSUAL SHORTNESS OF BREATH  *UNUSUAL BRUISING OR BLEEDING  TENDERNESS IN MOUTH AND THROAT WITH OR WITHOUT PRESENCE OF ULCERS  *URINARY PROBLEMS  *BOWEL PROBLEMS  UNUSUAL RASH Items with * indicate a potential emergency and should be followed up as soon as possible.  Feel free to call the clinic should you have any questions or concerns. The clinic phone number is [phone].  Please show the CHEMO ALERT CARD at check-in to the Emergency Department and triage nurse.   

## 2021-01-12 NOTE — Progress Notes (Signed)
[facility] OFFICE PROGRESS NOTE   Diagnosis: Pancreas cancer  INTERVAL HISTORY:   Mr. Batzel completed another cycle of FOLFOX on 12/22/2020.  No nausea/vomiting, mouth sores, or neuropathy symptoms.  He feels well.  Dizziness has improved since ramipril was discontinued.  Diarrhea improved when he stopped Creon.  He has approximately 3 loose stools per day.  No bleeding.  Objective:  Vital signs in last 24 hours:  Blood pressure 136/84, pulse 85, temperature 98.2 F (36.8 C), temperature source Tympanic, resp. rate 20, height '5\' 9"'  (1.753 m), weight 146 lb 1.6 oz (66.3 kg), SpO2 97 %.    HEENT: No thrush or ulcers Resp: Lungs clear bilaterally Cardio: Regular rate and rhythm GI: No mass, nontender, no hepatosplenomegaly Vascular: No leg edema Neuro: Moderate loss of vibratory sense at the fingertips bilaterally Skin: Mild hyperpigmentation of the hands  Portacath/PICC-without erythema  Lab Results:  Lab Results  Component Value Date   WBC 8.9 01/12/2021   HGB 10.2 (L) 01/12/2021   HCT 31.4 (L) 01/12/2021   MCV 99.4 01/12/2021   PLT 64 (L) 01/12/2021   NEUTROABS 7.7 01/12/2021    CMP  Lab Results  Component Value Date   NA 137 12/22/2020   K 4.3 12/22/2020   CL 109 12/22/2020   CO2 21 (L) 12/22/2020   GLUCOSE 120 (H) 12/22/2020   BUN 20 12/22/2020   CREATININE 0.87 12/22/2020   CALCIUM 9.0 12/22/2020   PROT 6.7 12/22/2020   ALBUMIN 3.9 12/22/2020   AST 30 12/22/2020   ALT 37 12/22/2020   ALKPHOS 185 (H) 12/22/2020   BILITOT 1.0 12/22/2020   GFRNONAA >60 12/22/2020   GFRAA >60 08/18/2020    Medications: I have reviewed the patient's current medications.   Assessment/Plan: 1. Pancreas cancer ? CT urology center 03/04/2020-haziness of the fat adjacent to the celiac axis and SMA with focal narrowing of the SMV, splenic vein, and splenoportal confluence ? CT 07/07/2020-infiltrative retroperitoneal mass with encasement of the celiac axis and  SMA, high-grade narrowing of the proximal portal vein with cavernous transformation, nonocclusive thrombus within the main portal vein, mild biliary ductal dilatation, diffuse hypodense/hypoenhancing areas within the liver (edema versus infiltrating neoplasm), 2 x 1.7 cm area of focal prominence in the pancreas ? EUS 07/10/2020-no pancreas mass identified, 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, 1.5 cm aortocaval node biopsy-scant lymphoid material, no malignancy ? Diagnostic laparoscopy 07/17/2020-segment 3 and 4 liver nodules,adenocarcinoma, positive for pankeratin, CK7 and CK20, partially positive for TTF-1, negative for CDX2 ? Foundation 1-microsatellite stable, tumor mutation burden 4, K-ras G12D, subclonal RB1 alteration ? Elevated CA 19-9 ? Cycle 1 FOLFOX 07/28/2020 ? Cycle 2 FOLFOX 08/18/2020,oxaliplatin dose reduced due to thrombocytopenia ? Cycle 3 FOLFOX 09/08/2020 ? Cycle 4 FOLFOX 09/29/2020 ? Cycle 5 FOLFOX 10/20/2020 ? CTs 11/06/2020-grossly stable infiltrative mass within the pancreatic head and porta hepatis. New small low-density liver lesions. 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 ? Cycle 6 FOLFOX 11/10/2021 ? Cycle 7 FOLFOX 12/01/2020 ? MRI liver 12/04/2020-no significant change in posttreatment appearance of the pancreatic head.  Numerous intrinsically low signal hypoenhancing lesions of the liver parenchyma predominantly observed in the right lobe of the liver and liver dome, measuring no greater than 8 mm and as seen on recent prior CT of the abdomen/pelvis. ? Cycle 8 FOLFOX 12/22/2020 ? Cycle 9 FOLFOX 01/12/2021  2. Chronic thrombocytopenia 3.  Diabetes 4. Hypertension 5. 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 Armijo appears stable.  He  continues to tolerate the chemotherapy well.  The platelet count did not decrease following the most recent cycle of chemotherapy.  He will use Imodium and Lomotil as needed for diarrhea.  The plan is to continue FOLFOX.  We will watch for progressive neuropathy symptoms.  We will consider a treatment break, maintenance 5-FU, or switching to FOLFIRI if he develops progressive neuropathy.  Mr. Clubb will complete another cycle of FOLFOX today.  He will return for an office visit and chemotherapy in 3 weeks.  We will follow up on the magnesium level from today.  Betsy Coder, MD  01/12/2021  8:57 AM

## 2021-01-12 NOTE — Progress Notes (Signed)
OK to treat per Dr. Gearldine Shown note today - The platelet count did not decrease following the most recent cycle of chemotherapy

## 2021-01-13 ENCOUNTER — Telehealth: Payer: Self-pay | Admitting: Oncology

## 2021-01-13 LAB — CANCER ANTIGEN 19-9: CA 19-9: 693 U/mL — ABNORMAL HIGH (ref 0–35)

## 2021-01-13 NOTE — Telephone Encounter (Signed)
Scheduled appointments per 2/28 los. Spoke to patient who is aware of appointments date and times.

## 2021-01-14 ENCOUNTER — Inpatient Hospital Stay: Payer: Medicare Other | Attending: Genetic Counselor

## 2021-01-14 ENCOUNTER — Other Ambulatory Visit: Payer: Self-pay

## 2021-01-14 VITALS — BP 110/66 | HR 81 | Temp 98.6°F | Resp 18

## 2021-01-14 DIAGNOSIS — E119 Type 2 diabetes mellitus without complications: Secondary | ICD-10-CM | POA: Insufficient documentation

## 2021-01-14 DIAGNOSIS — Z5189 Encounter for other specified aftercare: Secondary | ICD-10-CM | POA: Diagnosis not present

## 2021-01-14 DIAGNOSIS — Z79899 Other long term (current) drug therapy: Secondary | ICD-10-CM | POA: Diagnosis not present

## 2021-01-14 DIAGNOSIS — G893 Neoplasm related pain (acute) (chronic): Secondary | ICD-10-CM | POA: Diagnosis not present

## 2021-01-14 DIAGNOSIS — C787 Secondary malignant neoplasm of liver and intrahepatic bile duct: Secondary | ICD-10-CM | POA: Diagnosis present

## 2021-01-14 DIAGNOSIS — C250 Malignant neoplasm of head of pancreas: Secondary | ICD-10-CM | POA: Insufficient documentation

## 2021-01-14 DIAGNOSIS — Z5111 Encounter for antineoplastic chemotherapy: Secondary | ICD-10-CM | POA: Insufficient documentation

## 2021-01-14 DIAGNOSIS — D696 Thrombocytopenia, unspecified: Secondary | ICD-10-CM | POA: Diagnosis not present

## 2021-01-14 MED ORDER — HEPARIN SOD (PORK) LOCK FLUSH 100 UNIT/ML IV SOLN
500.0000 [IU] | Freq: Once | INTRAVENOUS | Status: AC | PRN
  Administered 2021-01-14: 500 [IU]
  Filled 2021-01-14: qty 5

## 2021-01-14 MED ORDER — PEGFILGRASTIM-BMEZ 6 MG/0.6ML ~~LOC~~ SOSY
PREFILLED_SYRINGE | SUBCUTANEOUS | Status: AC
  Filled 2021-01-14: qty 0.6

## 2021-01-14 MED ORDER — PEGFILGRASTIM-BMEZ 6 MG/0.6ML ~~LOC~~ SOSY
6.0000 mg | PREFILLED_SYRINGE | Freq: Once | SUBCUTANEOUS | Status: AC
  Administered 2021-01-14: 6 mg via SUBCUTANEOUS

## 2021-01-14 MED ORDER — SODIUM CHLORIDE 0.9% FLUSH
10.0000 mL | INTRAVENOUS | Status: DC | PRN
  Administered 2021-01-14: 10 mL
  Filled 2021-01-14: qty 10

## 2021-01-14 NOTE — Patient Instructions (Signed)
Pegfilgrastim injection What is this medicine? PEGFILGRASTIM (PEG fil gra stim) is a long-acting granulocyte colony-stimulating factor that stimulates the growth of neutrophils, a type of white blood cell important in the body's fight against infection. It is used to reduce the incidence of fever and infection in patients with certain types of cancer who are receiving chemotherapy that affects the bone marrow, and to increase survival after being exposed to high doses of radiation. This medicine may be used for other purposes; ask your health care provider or pharmacist if you have questions. COMMON BRAND NAME(S): Fulphila, Neulasta, Nyvepria, UDENYCA, Ziextenzo What should I tell my health care provider before I take this medicine? They need to know if you have any of these conditions:  kidney disease  latex allergy  ongoing radiation therapy  sickle cell disease  skin reactions to acrylic adhesives (On-Body Injector only)  an unusual or allergic reaction to pegfilgrastim, filgrastim, other medicines, foods, dyes, or preservatives  pregnant or trying to get pregnant  breast-feeding How should I use this medicine? This medicine is for injection under the skin. If you get this medicine at home, you will be taught how to prepare and give the pre-filled syringe or how to use the On-body Injector. Refer to the patient Instructions for Use for detailed instructions. Use exactly as directed. Tell your healthcare provider immediately if you suspect that the On-body Injector may not have performed as intended or if you suspect the use of the On-body Injector resulted in a missed or partial dose. It is important that you put your used needles and syringes in a special sharps container. Do not put them in a trash can. If you do not have a sharps container, call your pharmacist or healthcare provider to get one. Talk to your pediatrician regarding the use of this medicine in children. While this drug  may be prescribed for selected conditions, precautions do apply. Overdosage: If you think you have taken too much of this medicine contact a poison control center or emergency room at once. NOTE: This medicine is only for you. Do not share this medicine with others. What if I miss a dose? It is important not to miss your dose. Call your doctor or health care professional if you miss your dose. If you miss a dose due to an On-body Injector failure or leakage, a new dose should be administered as soon as possible using a single prefilled syringe for manual use. What may interact with this medicine? Interactions have not been studied. This list may not describe all possible interactions. Give your health care provider a list of all the medicines, herbs, non-prescription drugs, or dietary supplements you use. Also tell them if you smoke, drink alcohol, or use illegal drugs. Some items may interact with your medicine. What should I watch for while using this medicine? Your condition will be monitored carefully while you are receiving this medicine. You may need blood work done while you are taking this medicine. Talk to your health care provider about your risk of cancer. You may be more at risk for certain types of cancer if you take this medicine. If you are going to need a MRI, CT scan, or other procedure, tell your doctor that you are using this medicine (On-Body Injector only). What side effects may I notice from receiving this medicine? Side effects that you should report to your doctor or health care professional as soon as possible:  allergic reactions (skin rash, itching or hives, swelling of   the face, lips, or tongue)  back pain  dizziness  fever  pain, redness, or irritation at site where injected  pinpoint red spots on the skin  red or dark-brown urine  shortness of breath or breathing problems  stomach or side pain, or pain at the shoulder  swelling  tiredness  trouble  passing urine or change in the amount of urine  unusual bruising or bleeding Side effects that usually do not require medical attention (report to your doctor or health care professional if they continue or are bothersome):  bone pain  muscle pain This list may not describe all possible side effects. Call your doctor for medical advice about side effects. You may report side effects to FDA at 1-800-FDA-1088. Where should I keep my medicine? Keep out of the reach of children. If you are using this medicine at home, you will be instructed on how to store it. Throw away any unused medicine after the expiration date on the label. NOTE: This sheet is a summary. It may not cover all possible information. If you have questions about this medicine, talk to your doctor, pharmacist, or health care provider.  2021 Elsevier/Gold Standard (2019-11-23 13:20:51)  

## 2021-01-22 ENCOUNTER — Other Ambulatory Visit: Payer: Self-pay | Admitting: Oncology

## 2021-01-22 DIAGNOSIS — C250 Malignant neoplasm of head of pancreas: Secondary | ICD-10-CM

## 2021-02-01 ENCOUNTER — Other Ambulatory Visit: Payer: Self-pay | Admitting: Oncology

## 2021-02-02 ENCOUNTER — Inpatient Hospital Stay: Payer: Medicare Other

## 2021-02-02 ENCOUNTER — Inpatient Hospital Stay: Payer: Medicare Other | Admitting: Nutrition

## 2021-02-02 ENCOUNTER — Encounter: Payer: Self-pay | Admitting: Nurse Practitioner

## 2021-02-02 ENCOUNTER — Other Ambulatory Visit: Payer: Self-pay

## 2021-02-02 ENCOUNTER — Inpatient Hospital Stay (HOSPITAL_BASED_OUTPATIENT_CLINIC_OR_DEPARTMENT_OTHER): Payer: Medicare Other | Admitting: Nurse Practitioner

## 2021-02-02 VITALS — BP 128/71 | HR 82 | Temp 97.8°F | Resp 18 | Ht 69.0 in | Wt 140.7 lb

## 2021-02-02 DIAGNOSIS — C250 Malignant neoplasm of head of pancreas: Secondary | ICD-10-CM

## 2021-02-02 DIAGNOSIS — Z95828 Presence of other vascular implants and grafts: Secondary | ICD-10-CM

## 2021-02-02 DIAGNOSIS — Z5111 Encounter for antineoplastic chemotherapy: Secondary | ICD-10-CM | POA: Diagnosis not present

## 2021-02-02 LAB — CMP (CANCER CENTER ONLY)
ALT: 54 U/L — ABNORMAL HIGH (ref 0–44)
AST: 45 U/L — ABNORMAL HIGH (ref 15–41)
Albumin: 3.9 g/dL (ref 3.5–5.0)
Alkaline Phosphatase: 217 U/L — ABNORMAL HIGH (ref 38–126)
Anion gap: 5 (ref 5–15)
BUN: 21 mg/dL (ref 8–23)
CO2: 23 mmol/L (ref 22–32)
Calcium: 9.1 mg/dL (ref 8.9–10.3)
Chloride: 111 mmol/L (ref 98–111)
Creatinine: 1.4 mg/dL — ABNORMAL HIGH (ref 0.61–1.24)
GFR, Estimated: 53 mL/min — ABNORMAL LOW (ref >60)
Glucose, Bld: 143 mg/dL — ABNORMAL HIGH (ref 70–99)
Potassium: 4.1 mmol/L (ref 3.5–5.1)
Sodium: 139 mmol/L (ref 135–145)
Total Bilirubin: 0.9 mg/dL (ref 0.3–1.2)
Total Protein: 6.9 g/dL (ref 6.5–8.1)

## 2021-02-02 LAB — CBC WITH DIFFERENTIAL (CANCER CENTER ONLY)
Abs Immature Granulocytes: 0.02 10*3/uL (ref 0.00–0.07)
Basophils Absolute: 0 10*3/uL (ref 0.0–0.1)
Basophils Relative: 0 %
Eosinophils Absolute: 0.1 10*3/uL (ref 0.0–0.5)
Eosinophils Relative: 1 %
HCT: 30.8 % — ABNORMAL LOW (ref 39.0–52.0)
Hemoglobin: 10.1 g/dL — ABNORMAL LOW (ref 13.0–17.0)
Immature Granulocytes: 0 %
Lymphocytes Relative: 8 %
Lymphs Abs: 0.5 10*3/uL — ABNORMAL LOW (ref 0.7–4.0)
MCH: 32.5 pg (ref 26.0–34.0)
MCHC: 32.8 g/dL (ref 30.0–36.0)
MCV: 99 fL (ref 80.0–100.0)
Monocytes Absolute: 0.4 10*3/uL (ref 0.1–1.0)
Monocytes Relative: 5 %
Neutro Abs: 6.1 10*3/uL (ref 1.7–7.7)
Neutrophils Relative %: 86 %
Platelet Count: 57 10*3/uL — ABNORMAL LOW (ref 150–400)
RBC: 3.11 MIL/uL — ABNORMAL LOW (ref 4.22–5.81)
RDW: 14.2 % (ref 11.5–15.5)
WBC Count: 7.1 10*3/uL (ref 4.0–10.5)
nRBC: 0 % (ref 0.0–0.2)

## 2021-02-02 LAB — MAGNESIUM: Magnesium: 2 mg/dL (ref 1.7–2.4)

## 2021-02-02 MED ORDER — OXALIPLATIN CHEMO INJECTION 100 MG/20ML
50.0000 mg/m2 | Freq: Once | INTRAVENOUS | Status: AC
  Administered 2021-02-02: 90 mg via INTRAVENOUS
  Filled 2021-02-02: qty 18

## 2021-02-02 MED ORDER — PALONOSETRON HCL INJECTION 0.25 MG/5ML
0.2500 mg | Freq: Once | INTRAVENOUS | Status: AC
  Administered 2021-02-02: 0.25 mg via INTRAVENOUS

## 2021-02-02 MED ORDER — SODIUM CHLORIDE 0.9 % IV SOLN
2000.0000 mg/m2 | INTRAVENOUS | Status: DC
  Administered 2021-02-02: 3600 mg via INTRAVENOUS
  Filled 2021-02-02: qty 72

## 2021-02-02 MED ORDER — HEPARIN SOD (PORK) LOCK FLUSH 100 UNIT/ML IV SOLN
500.0000 [IU] | Freq: Once | INTRAVENOUS | Status: DC | PRN
  Filled 2021-02-02: qty 5

## 2021-02-02 MED ORDER — SODIUM CHLORIDE 0.9 % IV SOLN
10.0000 mg | Freq: Once | INTRAVENOUS | Status: AC
  Administered 2021-02-02: 10 mg via INTRAVENOUS
  Filled 2021-02-02: qty 10

## 2021-02-02 MED ORDER — SODIUM CHLORIDE 0.9% FLUSH
10.0000 mL | INTRAVENOUS | Status: DC | PRN
  Administered 2021-02-02: 10 mL
  Filled 2021-02-02: qty 10

## 2021-02-02 MED ORDER — LEUCOVORIN CALCIUM INJECTION 350 MG
300.0000 mg/m2 | Freq: Once | INTRAVENOUS | Status: AC
  Administered 2021-02-02: 544 mg via INTRAVENOUS
  Filled 2021-02-02: qty 27.2

## 2021-02-02 MED ORDER — DEXTROSE 5 % IV SOLN
Freq: Once | INTRAVENOUS | Status: AC
Start: 2021-02-02 — End: 2021-02-02
  Filled 2021-02-02: qty 250

## 2021-02-02 MED ORDER — PALONOSETRON HCL INJECTION 0.25 MG/5ML
INTRAVENOUS | Status: AC
  Filled 2021-02-02: qty 5

## 2021-02-02 MED ORDER — ALTEPLASE 2 MG IJ SOLR
2.0000 mg | Freq: Once | INTRAMUSCULAR | Status: DC | PRN
  Filled 2021-02-02: qty 2

## 2021-02-02 NOTE — Progress Notes (Signed)
 [facility]   Diagnosis: Pancreas cancer  INTERVAL HISTORY:   Aaron Johnston returns as scheduled.  He completed cycle 9 FOLFOX 01/12/2021.  He denies nausea/vomiting.  No mouth sores.  He continues to have diarrhea.  He takes Creon intermittently.  He denies numbness/tingling in the hands and feet.  Objective:  Vital signs in last 24 hours:  Blood pressure 128/71, pulse 82, temperature 97.8 F (36.6 C), resp. rate 18, height 5' 9" (1.753 m), weight 140 lb 11.2 oz (63.8 kg), SpO2 100 %.    HEENT: No thrush or ulcers. Resp: Lungs clear bilaterally. Cardio: Regular rate and rhythm. GI: Abdomen soft and nontender.  No hepatomegaly. Vascular: No leg edema.  Calves soft and nontender. Neuro: Vibratory sense moderately decreased over the fingertips per tuning fork exam right hand greater than left. Skin: Palms without erythema. Port-A-Cath without erythema.   Lab Results:  Lab Results  Component Value Date   WBC 7.1 02/02/2021   HGB 10.1 (L) 02/02/2021   HCT 30.8 (L) 02/02/2021   MCV 99.0 02/02/2021   PLT 57 (L) 02/02/2021   NEUTROABS 6.1 02/02/2021    Imaging:  No results found.  Medications: I have reviewed the patient's current medications.  Assessment/Plan: 1. Pancreas cancer ? CT urology center 03/04/2020-haziness of the fat adjacent to the celiac axis and SMA with focal narrowing of the SMV, splenic vein, and splenoportal confluence ? 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 EUS 07/10/2020-no pancreas mass identified, 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, 1.5 cm  aortocaval node biopsy-scant lymphoid material, no malignancy ? Diagnostic laparoscopy 07/17/2020-segment 3 and 4 liver nodules,adenocarcinoma, positive for pankeratin, CK7 and CK20, partially positive for TTF-1, negative for CDX2 ? Foundation 1-microsatellite stable, tumor mutation burden 4, K-ras G12D, subclonal RB1 alteration ? Elevated CA 19-9 ? Cycle 1 FOLFOX 07/28/2020 ? Cycle 2 FOLFOX 08/18/2020,oxaliplatin dose reduced due to thrombocytopenia ? Cycle 3 FOLFOX 09/08/2020 ? Cycle 4 FOLFOX 09/29/2020 ? Cycle 5 FOLFOX 10/20/2020 ? CTs 11/06/2020-grossly stable infiltrative mass within the pancreatic head and porta hepatis. New small low-density liver lesions. 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 ? Cycle 6 FOLFOX 11/10/2021 ? Cycle 7 FOLFOX 12/01/2020 ? MRI liver 12/04/2020-no significant change in posttreatment appearance of the pancreatic head.  Numerous intrinsically low signal hypoenhancing lesions of the liver parenchyma predominantly observed in the right lobe of the liver and liver dome, measuring no greater than 8 mm and as seen on recent prior CT of the abdomen/pelvis. ? Cycle 8 FOLFOX 12/22/2020 ? Cycle 9 FOLFOX 01/12/2021 ? Cycle 10 FOLFOX 02/02/2021  2. Chronic thrombocytopenia 3. Diabetes 4. Hypertension 5. 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 Boutwell appears stable.  He has completed 9 cycles of FOLFOX.  Plan to proceed with cycle 10 today as scheduled.  We will follow-up on the CA 19-9 from today.  We reviewed the CBC from today.  Counts adequate to proceed with treatment.  He has stable moderate thrombocytopenia.  He understands to contact   the office with bleeding.  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   We discussed taking Creon with each meal, increasing the dose to 3 capsules.  He understands he should also take Creon with snacks.  He will return for lab, follow-up, cycle 10 FOLFOX in 3 weeks.  He will contact the office in the interim with any problems.    [name] ANP/GNP-BC   02/02/2021  9:42 AM

## 2021-02-02 NOTE — Progress Notes (Signed)
Per office visit note from [name], NP "Counts adequate to proceed with treatment."

## 2021-02-02 NOTE — Patient Instructions (Signed)
[facility] Discharge Instructions for Patients Receiving Chemotherapy  Today you received the following chemotherapy agents oxaliplatin, leucovorin, fluorourcil.  To help prevent nausea and vomiting after your treatment, we encourage you to take your nausea medication as directed.   If you develop nausea and vomiting that is not controlled by your nausea medication, call the clinic.   BELOW ARE SYMPTOMS THAT SHOULD BE REPORTED IMMEDIATELY:  *FEVER GREATER THAN 100.5 F  *CHILLS WITH OR WITHOUT FEVER  NAUSEA AND VOMITING THAT IS NOT CONTROLLED WITH YOUR NAUSEA MEDICATION  *UNUSUAL SHORTNESS OF BREATH  *UNUSUAL BRUISING OR BLEEDING  TENDERNESS IN MOUTH AND THROAT WITH OR WITHOUT PRESENCE OF ULCERS  *URINARY PROBLEMS  *BOWEL PROBLEMS  UNUSUAL RASH Items with * indicate a potential emergency and should be followed up as soon as possible.  Feel free to call the clinic should you have any questions or concerns. The clinic phone number is [phone].  Please show the CHEMO ALERT CARD at check-in to the Emergency Department and triage nurse.   

## 2021-02-02 NOTE — Progress Notes (Signed)
Nutrition  Nutrition follow-up completed with patient during infusion for pancreatic cancer. Patient eating 3 times daily, reports diarrhea after meals, denies nausea/vomiting. He reports he has tried Creon, does not take regularly, reports it does not work. MD has increased dosage to 3 (36000 units) capsules with meals. He is drinking Enterade, but does not feel this has helped him. Patient reports altered taste, denies mouth sores. He has stopped going to restaurants due to poor taste and eating meals at home, recalls cooking pintos over the weekend.   Medications: hydrocodone, Lispro, Creon, Mag-ox, Imodium, Zofran, Protonix, Compazine, Januvia  Labs: Glucose 143, Cr 1.40, AST 45, ALT 54, Alkaline Phosphatase 217  Anthropometrics:   Weight 140 lb 11.2 oz today down ~5 lbs from 146 lb 1.6 oz on 2/28 and ~ 11 lbs in the last 2 months  2/7 - 148 lb 9.6 oz 1/17 - 152 lb 1.6 oz  NUTRITION DIAGNOSIS: Food and nutrition related knowledge deficit continues   INTERVENTION:  Educated patient to take 1 capsule of Creon with first bite of meal and take 2 more capsules when half way through with meal as well as 1-2 capsules with snacks Discussed trying small frequent meals vs 3 larger meals  Encouraged white rice, bananas, toast, applesauce  Sample of benefiber provided today Discussed the importance of mouth care Fact sheets provided    MONITORING, EVALUATION, GOAL:  Patient will tolerate adequate calories and protein to weight maintenance    NEXT VISIT: April 6 via telephone

## 2021-02-03 ENCOUNTER — Telehealth: Payer: Self-pay | Admitting: Oncology

## 2021-02-03 LAB — CANCER ANTIGEN 19-9: CA 19-9: 1085 U/mL — ABNORMAL HIGH (ref 0–35)

## 2021-02-03 NOTE — Telephone Encounter (Signed)
Scheduled appt per 3/21 los - pt to get an updated schedule next visit.

## 2021-02-03 NOTE — Telephone Encounter (Signed)
Scheduled per 03/21 scheduled message, patient has been called and voicemail was left. 

## 2021-02-04 ENCOUNTER — Other Ambulatory Visit: Payer: Self-pay

## 2021-02-04 ENCOUNTER — Inpatient Hospital Stay: Payer: Medicare Other

## 2021-02-04 VITALS — BP 116/61 | HR 85 | Temp 98.3°F | Resp 18

## 2021-02-04 DIAGNOSIS — Z5111 Encounter for antineoplastic chemotherapy: Secondary | ICD-10-CM | POA: Diagnosis not present

## 2021-02-04 DIAGNOSIS — C250 Malignant neoplasm of head of pancreas: Secondary | ICD-10-CM

## 2021-02-04 MED ORDER — SODIUM CHLORIDE 0.9% FLUSH
10.0000 mL | INTRAVENOUS | Status: DC | PRN
  Administered 2021-02-04: 10 mL
  Filled 2021-02-04: qty 10

## 2021-02-04 MED ORDER — HEPARIN SOD (PORK) LOCK FLUSH 100 UNIT/ML IV SOLN
500.0000 [IU] | Freq: Once | INTRAVENOUS | Status: AC | PRN
  Administered 2021-02-04: 500 [IU]
  Filled 2021-02-04: qty 5

## 2021-02-04 MED ORDER — PEGFILGRASTIM-BMEZ 6 MG/0.6ML ~~LOC~~ SOSY
6.0000 mg | PREFILLED_SYRINGE | Freq: Once | SUBCUTANEOUS | Status: AC
  Administered 2021-02-04: 6 mg via SUBCUTANEOUS

## 2021-02-04 MED ORDER — PEGFILGRASTIM-BMEZ 6 MG/0.6ML ~~LOC~~ SOSY
PREFILLED_SYRINGE | SUBCUTANEOUS | Status: AC
  Filled 2021-02-04: qty 0.6

## 2021-02-04 NOTE — Patient Instructions (Signed)

## 2021-02-05 ENCOUNTER — Other Ambulatory Visit: Payer: Self-pay | Admitting: Nurse Practitioner

## 2021-02-05 DIAGNOSIS — C250 Malignant neoplasm of head of pancreas: Secondary | ICD-10-CM

## 2021-02-18 ENCOUNTER — Inpatient Hospital Stay: Payer: Medicare Other | Attending: Genetic Counselor | Admitting: Dietician

## 2021-02-18 ENCOUNTER — Telehealth: Payer: Self-pay | Admitting: Dietician

## 2021-02-18 DIAGNOSIS — C250 Malignant neoplasm of head of pancreas: Secondary | ICD-10-CM | POA: Insufficient documentation

## 2021-02-18 DIAGNOSIS — I10 Essential (primary) hypertension: Secondary | ICD-10-CM | POA: Insufficient documentation

## 2021-02-18 DIAGNOSIS — Z5111 Encounter for antineoplastic chemotherapy: Secondary | ICD-10-CM | POA: Insufficient documentation

## 2021-02-18 DIAGNOSIS — M549 Dorsalgia, unspecified: Secondary | ICD-10-CM | POA: Insufficient documentation

## 2021-02-18 DIAGNOSIS — E119 Type 2 diabetes mellitus without complications: Secondary | ICD-10-CM | POA: Insufficient documentation

## 2021-02-18 DIAGNOSIS — D696 Thrombocytopenia, unspecified: Secondary | ICD-10-CM | POA: Insufficient documentation

## 2021-02-18 DIAGNOSIS — Z79899 Other long term (current) drug therapy: Secondary | ICD-10-CM | POA: Insufficient documentation

## 2021-02-18 DIAGNOSIS — R197 Diarrhea, unspecified: Secondary | ICD-10-CM | POA: Insufficient documentation

## 2021-02-18 DIAGNOSIS — R109 Unspecified abdominal pain: Secondary | ICD-10-CM | POA: Insufficient documentation

## 2021-02-18 DIAGNOSIS — C787 Secondary malignant neoplasm of liver and intrahepatic bile duct: Secondary | ICD-10-CM | POA: Insufficient documentation

## 2021-02-18 NOTE — Telephone Encounter (Signed)
Nutrition  Patient scheduled for nutrition follow-up via telephone today. Unable to reach patient, message with request for call back and telephone left on voicemail.  Lajuan Lines, [place], [place] Clinical Nutrition Cell [phone]

## 2021-02-19 ENCOUNTER — Ambulatory Visit (HOSPITAL_COMMUNITY)
Admission: RE | Admit: 2021-02-19 | Discharge: 2021-02-19 | Disposition: A | Discharge status: Home / Self Care | Payer: Medicare Other | Source: Ambulatory Visit | Attending: Nurse Practitioner | Admitting: Nurse Practitioner

## 2021-02-19 ENCOUNTER — Other Ambulatory Visit: Payer: Self-pay

## 2021-02-19 DIAGNOSIS — C250 Malignant neoplasm of head of pancreas: Secondary | ICD-10-CM | POA: Diagnosis not present

## 2021-02-19 MED ORDER — GADOBUTROL 1 MMOL/ML IV SOLN
6.0000 mL | Freq: Once | INTRAVENOUS | Status: AC | PRN
  Administered 2021-02-19: 6 mL via INTRAVENOUS

## 2021-02-22 ENCOUNTER — Other Ambulatory Visit: Payer: Self-pay | Admitting: Oncology

## 2021-02-23 ENCOUNTER — Other Ambulatory Visit: Payer: Medicare Other

## 2021-02-23 ENCOUNTER — Telehealth: Payer: Self-pay

## 2021-02-23 ENCOUNTER — Inpatient Hospital Stay: Payer: Medicare Other

## 2021-02-23 ENCOUNTER — Other Ambulatory Visit: Payer: Self-pay

## 2021-02-23 ENCOUNTER — Inpatient Hospital Stay (HOSPITAL_BASED_OUTPATIENT_CLINIC_OR_DEPARTMENT_OTHER): Payer: Medicare Other | Admitting: Oncology

## 2021-02-23 ENCOUNTER — Ambulatory Visit: Payer: Medicare Other

## 2021-02-23 ENCOUNTER — Ambulatory Visit: Payer: Medicare Other | Attending: Internal Medicine

## 2021-02-23 VITALS — BP 116/61 | HR 85 | Temp 98.3°F | Resp 18 | Wt 140.4 lb

## 2021-02-23 DIAGNOSIS — M549 Dorsalgia, unspecified: Secondary | ICD-10-CM | POA: Diagnosis not present

## 2021-02-23 DIAGNOSIS — C250 Malignant neoplasm of head of pancreas: Secondary | ICD-10-CM

## 2021-02-23 DIAGNOSIS — R109 Unspecified abdominal pain: Secondary | ICD-10-CM | POA: Diagnosis not present

## 2021-02-23 DIAGNOSIS — Z5111 Encounter for antineoplastic chemotherapy: Secondary | ICD-10-CM | POA: Diagnosis not present

## 2021-02-23 DIAGNOSIS — Z23 Encounter for immunization: Secondary | ICD-10-CM

## 2021-02-23 DIAGNOSIS — D696 Thrombocytopenia, unspecified: Secondary | ICD-10-CM | POA: Diagnosis not present

## 2021-02-23 DIAGNOSIS — Z95828 Presence of other vascular implants and grafts: Secondary | ICD-10-CM

## 2021-02-23 DIAGNOSIS — I10 Essential (primary) hypertension: Secondary | ICD-10-CM | POA: Diagnosis not present

## 2021-02-23 DIAGNOSIS — C787 Secondary malignant neoplasm of liver and intrahepatic bile duct: Secondary | ICD-10-CM | POA: Diagnosis not present

## 2021-02-23 DIAGNOSIS — E119 Type 2 diabetes mellitus without complications: Secondary | ICD-10-CM | POA: Diagnosis not present

## 2021-02-23 DIAGNOSIS — Z79899 Other long term (current) drug therapy: Secondary | ICD-10-CM | POA: Diagnosis not present

## 2021-02-23 DIAGNOSIS — R197 Diarrhea, unspecified: Secondary | ICD-10-CM | POA: Diagnosis not present

## 2021-02-23 LAB — CBC WITH DIFFERENTIAL (CANCER CENTER ONLY)
Abs Immature Granulocytes: 0.03 10*3/uL (ref 0.00–0.07)
Basophils Absolute: 0 10*3/uL (ref 0.0–0.1)
Basophils Relative: 1 %
Eosinophils Absolute: 0.1 10*3/uL (ref 0.0–0.5)
Eosinophils Relative: 1 %
HCT: 28.3 % — ABNORMAL LOW (ref 39.0–52.0)
Hemoglobin: 9.2 g/dL — ABNORMAL LOW (ref 13.0–17.0)
Immature Granulocytes: 1 %
Lymphocytes Relative: 9 %
Lymphs Abs: 0.6 10*3/uL — ABNORMAL LOW (ref 0.7–4.0)
MCH: 32.5 pg (ref 26.0–34.0)
MCHC: 32.5 g/dL (ref 30.0–36.0)
MCV: 100 fL (ref 80.0–100.0)
Monocytes Absolute: 0.4 10*3/uL (ref 0.1–1.0)
Monocytes Relative: 6 %
Neutro Abs: 5.3 10*3/uL (ref 1.7–7.7)
Neutrophils Relative %: 82 %
Platelet Count: 47 10*3/uL — ABNORMAL LOW (ref 150–400)
RBC: 2.83 MIL/uL — ABNORMAL LOW (ref 4.22–5.81)
RDW: 14.4 % (ref 11.5–15.5)
WBC Count: 6.3 10*3/uL (ref 4.0–10.5)
nRBC: 0 % (ref 0.0–0.2)

## 2021-02-23 LAB — CMP (CANCER CENTER ONLY)
ALT: 29 U/L (ref 10–47)
AST: 31 U/L (ref 11–38)
Albumin: 4 g/dL (ref 3.5–5.0)
Alkaline Phosphatase: 146 U/L — ABNORMAL HIGH (ref 38–126)
BUN: 19 mg/dL (ref 8–23)
CO2: 22 mmol/L (ref 22–32)
Calcium: 8.6 mg/dL — ABNORMAL LOW (ref 8.9–10.3)
Chloride: 108 mmol/L (ref 98–111)
Creatinine: 1.12 mg/dL (ref 0.60–1.20)
Glucose, Bld: 232 mg/dL — ABNORMAL HIGH (ref 70–99)
Potassium: 4.1 mmol/L (ref 3.5–5.1)
Sodium: 137 mmol/L (ref 135–145)
Total Bilirubin: 0.9 mg/dL (ref 0.2–1.6)
Total Protein: 6.5 g/dL (ref 6.5–8.1)

## 2021-02-23 MED ORDER — SODIUM CHLORIDE 0.9% FLUSH
10.0000 mL | INTRAVENOUS | Status: AC | PRN
  Administered 2021-02-23: 10 mL
  Filled 2021-02-23: qty 10

## 2021-02-23 MED ORDER — SODIUM CHLORIDE 0.9 % IV SOLN
10.0000 mg | Freq: Once | INTRAVENOUS | Status: DC
  Filled 2021-02-23: qty 1

## 2021-02-23 MED ORDER — HEPARIN SOD (PORK) LOCK FLUSH 100 UNIT/ML IV SOLN
500.0000 [IU] | Freq: Once | INTRAVENOUS | Status: AC | PRN
  Administered 2021-02-23: 500 [IU]
  Filled 2021-02-23: qty 5

## 2021-02-23 NOTE — Patient Instructions (Signed)

## 2021-02-23 NOTE — Progress Notes (Signed)
DISCONTINUE ON PATHWAY REGIMEN - Pancreatic Adenocarcinoma     A cycle is every 14 days:     Oxaliplatin      Leucovorin      Irinotecan      Fluorouracil   **Always confirm dose/schedule in your pharmacy ordering system**  REASON: Disease Progression PRIOR TREATMENT: PANOS98: mFOLFIRINOX q14 Days Until Progression or Toxicity TREATMENT RESPONSE: Partial Response (PR)  START ON PATHWAY REGIMEN - Pancreatic Adenocarcinoma     A cycle is every 28 days:     Nab-paclitaxel (protein bound)      Gemcitabine   **Always confirm dose/schedule in your pharmacy ordering system**  Patient Characteristics: Metastatic Disease, Second Line, MSS/pMMR or MSI Unknown, Fluoropyrimidine-Based Therapy First Line Therapeutic Status: Metastatic Disease Line of Therapy: Second Line Microsatellite/Mismatch Repair Status: MSS/pMMR Intent of Therapy: Non-Curative / Palliative Intent, Discussed with Patient

## 2021-02-23 NOTE — Progress Notes (Signed)
   Covid-19 Vaccination Clinic  Name:  Aaron Johnston    MRN: [id number] DOB: [birth year]  02/23/2021  Aaron Johnston was observed post Covid-19 immunization for 15 minutes without incident. He was provided with Vaccine Information Sheet and instruction to access the V-Safe system.   Aaron Johnston was instructed to call 911 with any severe reactions post vaccine: Marland Kitchen Difficulty breathing  . Swelling of face and throat  . A fast heartbeat  . A bad rash all over body  . Dizziness and weakness   Immunizations Administered    Name Date Dose VIS Date Route   PFIZER Comrnaty(Gray TOP) Covid-19 Vaccine 02/23/2021  1:05 PM 0.3 mL 10/23/2020 Intramuscular   Manufacturer: [employer]   Lot: [id number]   NDC: [id number]

## 2021-02-23 NOTE — Telephone Encounter (Signed)
TC to Pt per Dr Benay Spice. Pt's platelet count is 47. Pt to be scheduled to receive Nplate. Informed Pt he will receive a call from scheduling to confirm appointment date and time. Pt verbalized understanding. No further problems or concerns noted.

## 2021-02-23 NOTE — Progress Notes (Signed)
[facility] OFFICE PROGRESS NOTE   Diagnosis: Pancreas cancer  INTERVAL HISTORY:   Aaron Johnston returns as scheduled.  He is here with his wife.  He completed another treatment with FOLFOX on 02/02/2021.  He reports mild numbness in the fingers.  No other complaint.  His blood sugar has not been high and he is currently not taking insulin.  Objective:  Vital signs in last 24 hours:  Blood pressure 116/61, pulse 85, temperature 98.3 F (36.8 C), temperature source Oral, resp. rate 18, weight 140 lb 6.4 oz (63.7 kg), SpO2 100 %.    HEENT: No thrush or ulcers Resp: Lungs clear bilaterally Cardio: Regular rate and rhythm GI: No mass, no hepatosplenomegaly Vascular: No leg edema    Portacath/PICC-without erythema  Lab Results:  Lab Results  Component Value Date   WBC 6.3 02/23/2021   HGB 9.2 (L) 02/23/2021   HCT 28.3 (L) 02/23/2021   MCV 100.0 02/23/2021   PLT 47 (L) 02/23/2021   NEUTROABS 5.3 02/23/2021    CMP  Lab Results  Component Value Date   NA 137 02/23/2021   K 4.1 02/23/2021   CL 108 02/23/2021   CO2 22 02/23/2021   GLUCOSE 232 (H) 02/23/2021   BUN 19 02/23/2021   CREATININE 1.12 02/23/2021   CALCIUM 8.6 (L) 02/23/2021   PROT 6.5 02/23/2021   ALBUMIN 4.0 02/23/2021   AST 31 02/23/2021   ALT 29 02/23/2021   ALKPHOS 146 (H) 02/23/2021   BILITOT 0.9 02/23/2021   GFRNONAA NOT CALCULATED 02/23/2021   GFRAA NOT CALCULATED 02/23/2021   CA 19-9 on 02/02/2021: 10085 Imaging: MRI images from 02/19/2021 reviewed with Aaron Johnston and his wife   Medications: I have reviewed the patient's current medications.   Assessment/Plan:  1. Pancreas cancer ? CT urology center 03/04/2020-haziness of the fat adjacent to the celiac axis and SMA with focal narrowing of the SMV, splenic vein, and splenoportal confluence ? CT 07/07/2020-infiltrative retroperitoneal mass with encasement of the celiac axis and SMA, high-grade narrowing of the proximal portal vein  with cavernous transformation, nonocclusive thrombus within the main portal vein, mild biliary ductal dilatation, diffuse hypodense/hypoenhancing areas within the liver (edema versus infiltrating neoplasm), 2 x 1.7 cm area of focal prominence in the pancreas ? EUS 07/10/2020-no pancreas mass identified, 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, 1.5 cm aortocaval node biopsy-scant lymphoid material, no malignancy ? Diagnostic laparoscopy 07/17/2020-segment 3 and 4 liver nodules,adenocarcinoma, positive for pankeratin, CK7 and CK20, partially positive for TTF-1, negative for CDX2 ? Foundation 1-microsatellite stable, tumor mutation burden 4, K-ras G12D, subclonal RB1 alteration ? Elevated CA 19-9 ? Cycle 1 FOLFOX 07/28/2020 ? Cycle 2 FOLFOX 08/18/2020,oxaliplatin dose reduced due to thrombocytopenia ? Cycle 3 FOLFOX 09/08/2020 ? Cycle 4 FOLFOX 09/29/2020 ? Cycle 5 FOLFOX 10/20/2020 ? CTs 11/06/2020-grossly stable infiltrative mass within the pancreatic head and porta hepatis. New small low-density liver lesions. 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 ? Cycle 6 FOLFOX 11/10/2021 ? Cycle 7 FOLFOX 12/01/2020 ? MRI liver 12/04/2020-no significant change in posttreatment appearance of the pancreatic head.  Numerous intrinsically low signal hypoenhancing lesions of the liver parenchyma predominantly observed in the right lobe of the liver and liver dome, measuring no greater than 8 mm and as seen on recent prior CT of the abdomen/pelvis. ? Cycle 8 FOLFOX 12/22/2020 ? Cycle 9 FOLFOX 01/12/2021 ? Cycle 10 FOLFOX  02/02/2021 ? MRI liver 02/19/2021-no change in pancreas head mass, multiple hypoenhancing liver lesions are new and increased in size, effacement of portal vein by pancreas head mass with cavernous transformation, no change in mild splenomegaly  2. Chronic  thrombocytopenia 3. Diabetes 4. Hypertension 5. 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  The CA 19-9 is higher and the restaging liver MRI is consistent with disease progression involving the liver.  I reviewed the MRI images with Aaron Johnston and his wife.  We discussed treatment options.  We discussed discontinuing chemotherapy versus a trial of salvage systemic therapy.  I recommend treatment with gemcitabine/Abraxane.  We reviewed potential toxicities associated with the gemcitabine/Abraxane regimen including the chance of hematologic toxicity.  We discussed the fever, rash, and pneumonitis associated with gemcitabine.  We discussed the chance of an allergic reaction, neuropathy, and alopecia with Abraxane.  He agrees to proceed.  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  However the thrombocytopenia could be related to chronic liver disease or bone marrow involvement with pancreas cancer.  I recommend a trial of Nplate prior to initiating gemcitabine/Abraxane therapy.  We will ask him to return for Nplate within the next few days and he will be scheduled for chemotherapy during the week of 03/09/2021.    Betsy Coder, MD  02/23/2021  2:54 PM

## 2021-02-24 ENCOUNTER — Encounter: Payer: Self-pay | Admitting: Oncology

## 2021-02-24 ENCOUNTER — Other Ambulatory Visit: Payer: Self-pay | Admitting: Oncology

## 2021-02-24 DIAGNOSIS — C250 Malignant neoplasm of head of pancreas: Secondary | ICD-10-CM

## 2021-02-24 LAB — CANCER ANTIGEN 19-9: CA 19-9: 1915 U/mL — ABNORMAL HIGH (ref 0–35)

## 2021-02-25 ENCOUNTER — Telehealth: Payer: Self-pay | Admitting: Oncology

## 2021-02-25 ENCOUNTER — Other Ambulatory Visit: Payer: Self-pay

## 2021-02-25 NOTE — Telephone Encounter (Signed)
Called pt per 4/13 sch msg - left message for patient with appt date and time

## 2021-02-26 ENCOUNTER — Inpatient Hospital Stay: Payer: Medicare Other

## 2021-02-26 ENCOUNTER — Other Ambulatory Visit: Payer: Self-pay

## 2021-02-26 VITALS — BP 125/68 | HR 72 | Temp 98.5°F | Resp 20

## 2021-02-26 DIAGNOSIS — C250 Malignant neoplasm of head of pancreas: Secondary | ICD-10-CM

## 2021-02-26 DIAGNOSIS — Z95828 Presence of other vascular implants and grafts: Secondary | ICD-10-CM

## 2021-02-26 LAB — CBC WITH DIFFERENTIAL (CANCER CENTER ONLY)
Abs Immature Granulocytes: 0.03 10*3/uL (ref 0.00–0.07)
Basophils Absolute: 0 10*3/uL (ref 0.0–0.1)
Basophils Relative: 1 %
Eosinophils Absolute: 0.1 10*3/uL (ref 0.0–0.5)
Eosinophils Relative: 2 %
HCT: 29.6 % — ABNORMAL LOW (ref 39.0–52.0)
Hemoglobin: 9.5 g/dL — ABNORMAL LOW (ref 13.0–17.0)
Immature Granulocytes: 1 %
Lymphocytes Relative: 13 %
Lymphs Abs: 0.7 10*3/uL (ref 0.7–4.0)
MCH: 32.1 pg (ref 26.0–34.0)
MCHC: 32.1 g/dL (ref 30.0–36.0)
MCV: 100 fL (ref 80.0–100.0)
Monocytes Absolute: 0.7 10*3/uL (ref 0.1–1.0)
Monocytes Relative: 11 %
Neutro Abs: 4.2 10*3/uL (ref 1.7–7.7)
Neutrophils Relative %: 72 %
Platelet Count: 55 10*3/uL — ABNORMAL LOW (ref 150–400)
RBC: 2.96 MIL/uL — ABNORMAL LOW (ref 4.22–5.81)
RDW: 14.3 % (ref 11.5–15.5)
WBC Count: 5.8 10*3/uL (ref 4.0–10.5)
nRBC: 0 % (ref 0.0–0.2)

## 2021-02-26 MED ORDER — ROMIPLOSTIM 125 MCG ~~LOC~~ SOLR
1.0000 ug/kg | Freq: Once | SUBCUTANEOUS | Status: AC
  Administered 2021-02-26: 65 ug via SUBCUTANEOUS
  Filled 2021-02-26: qty 0.13

## 2021-03-02 ENCOUNTER — Inpatient Hospital Stay: Payer: Medicare Other

## 2021-03-02 ENCOUNTER — Other Ambulatory Visit: Payer: Self-pay

## 2021-03-02 DIAGNOSIS — C250 Malignant neoplasm of head of pancreas: Secondary | ICD-10-CM

## 2021-03-02 DIAGNOSIS — Z95828 Presence of other vascular implants and grafts: Secondary | ICD-10-CM

## 2021-03-02 LAB — CBC WITH DIFFERENTIAL (CANCER CENTER ONLY)
Abs Immature Granulocytes: 0.02 10*3/uL (ref 0.00–0.07)
Basophils Absolute: 0 10*3/uL (ref 0.0–0.1)
Basophils Relative: 1 %
Eosinophils Absolute: 0.1 10*3/uL (ref 0.0–0.5)
Eosinophils Relative: 2 %
HCT: 28.6 % — ABNORMAL LOW (ref 39.0–52.0)
Hemoglobin: 9.3 g/dL — ABNORMAL LOW (ref 13.0–17.0)
Immature Granulocytes: 1 %
Lymphocytes Relative: 13 %
Lymphs Abs: 0.6 10*3/uL — ABNORMAL LOW (ref 0.7–4.0)
MCH: 32.1 pg (ref 26.0–34.0)
MCHC: 32.5 g/dL (ref 30.0–36.0)
MCV: 98.6 fL (ref 80.0–100.0)
Monocytes Absolute: 0.2 10*3/uL (ref 0.1–1.0)
Monocytes Relative: 5 %
Neutro Abs: 3.4 10*3/uL (ref 1.7–7.7)
Neutrophils Relative %: 78 %
Platelet Count: 57 10*3/uL — ABNORMAL LOW (ref 150–400)
RBC: 2.9 MIL/uL — ABNORMAL LOW (ref 4.22–5.81)
RDW: 13.8 % (ref 11.5–15.5)
WBC Count: 4.3 10*3/uL (ref 4.0–10.5)
nRBC: 0 % (ref 0.0–0.2)

## 2021-03-02 LAB — CMP (CANCER CENTER ONLY)
ALT: 25 U/L (ref 0–44)
AST: 24 U/L (ref 15–41)
Albumin: 4.1 g/dL (ref 3.5–5.0)
Alkaline Phosphatase: 149 U/L — ABNORMAL HIGH (ref 38–126)
Anion gap: 7 (ref 5–15)
BUN: 15 mg/dL (ref 8–23)
CO2: 25 mmol/L (ref 22–32)
Calcium: 9.1 mg/dL (ref 8.9–10.3)
Chloride: 106 mmol/L (ref 98–111)
Creatinine: 1.11 mg/dL (ref 0.61–1.24)
GFR, Estimated: >60 mL/min (ref >60)
Glucose, Bld: 161 mg/dL — ABNORMAL HIGH (ref 70–99)
Potassium: 3.9 mmol/L (ref 3.5–5.1)
Sodium: 138 mmol/L (ref 135–145)
Total Bilirubin: 1 mg/dL (ref 0.3–1.2)
Total Protein: 6.8 g/dL (ref 6.5–8.1)

## 2021-03-02 MED ORDER — SODIUM CHLORIDE 0.9% FLUSH
10.0000 mL | INTRAVENOUS | Status: DC | PRN
  Administered 2021-03-02: 10 mL
  Filled 2021-03-02: qty 10

## 2021-03-02 MED ORDER — HEPARIN SOD (PORK) LOCK FLUSH 100 UNIT/ML IV SOLN
500.0000 [IU] | Freq: Once | INTRAVENOUS | Status: AC
  Administered 2021-03-02: 500 [IU] via INTRAVENOUS
  Filled 2021-03-02: qty 5

## 2021-03-02 NOTE — Progress Notes (Signed)
Patient will not receive chemo today. Patient will receive nplate on Thursday of this week 03/05/21 and begin abraxane/gemzar the following week.

## 2021-03-02 NOTE — Addendum Note (Signed)
Addended by: Quita Skye on: 03/02/2021 02:56 PM   Modules accepted: Orders

## 2021-03-05 ENCOUNTER — Other Ambulatory Visit: Payer: Self-pay

## 2021-03-05 ENCOUNTER — Inpatient Hospital Stay: Payer: Medicare Other

## 2021-03-05 VITALS — BP 126/73 | HR 70 | Temp 98.1°F | Resp 18 | Wt 137.0 lb

## 2021-03-05 DIAGNOSIS — C250 Malignant neoplasm of head of pancreas: Secondary | ICD-10-CM

## 2021-03-05 DIAGNOSIS — Z95828 Presence of other vascular implants and grafts: Secondary | ICD-10-CM

## 2021-03-05 LAB — CBC WITH DIFFERENTIAL (CANCER CENTER ONLY)
Abs Immature Granulocytes: 0.01 10*3/uL (ref 0.00–0.07)
Basophils Absolute: 0 10*3/uL (ref 0.0–0.1)
Basophils Relative: 1 %
Eosinophils Absolute: 0.1 10*3/uL (ref 0.0–0.5)
Eosinophils Relative: 2 %
HCT: 29.7 % — ABNORMAL LOW (ref 39.0–52.0)
Hemoglobin: 9.6 g/dL — ABNORMAL LOW (ref 13.0–17.0)
Immature Granulocytes: 0 %
Lymphocytes Relative: 17 %
Lymphs Abs: 0.7 10*3/uL (ref 0.7–4.0)
MCH: 31.8 pg (ref 26.0–34.0)
MCHC: 32.3 g/dL (ref 30.0–36.0)
MCV: 98.3 fL (ref 80.0–100.0)
Monocytes Absolute: 0.3 10*3/uL (ref 0.1–1.0)
Monocytes Relative: 7 %
Neutro Abs: 3.1 10*3/uL (ref 1.7–7.7)
Neutrophils Relative %: 73 %
Platelet Count: 66 10*3/uL — ABNORMAL LOW (ref 150–400)
RBC: 3.02 MIL/uL — ABNORMAL LOW (ref 4.22–5.81)
RDW: 13.8 % (ref 11.5–15.5)
WBC Count: 4.2 10*3/uL (ref 4.0–10.5)
nRBC: 0 % (ref 0.0–0.2)

## 2021-03-05 MED ORDER — ROMIPLOSTIM 125 MCG ~~LOC~~ SOLR
1.0000 ug/kg | Freq: Once | SUBCUTANEOUS | Status: AC
  Administered 2021-03-05: 60 ug via SUBCUTANEOUS
  Filled 2021-03-05: qty 0.12

## 2021-03-08 ENCOUNTER — Other Ambulatory Visit: Payer: Self-pay | Admitting: Oncology

## 2021-03-09 ENCOUNTER — Other Ambulatory Visit: Payer: Self-pay | Admitting: *Deleted

## 2021-03-09 DIAGNOSIS — C250 Malignant neoplasm of head of pancreas: Secondary | ICD-10-CM

## 2021-03-11 ENCOUNTER — Ambulatory Visit: Payer: Medicare Other

## 2021-03-11 ENCOUNTER — Other Ambulatory Visit: Payer: Self-pay

## 2021-03-11 ENCOUNTER — Other Ambulatory Visit: Payer: Medicare Other

## 2021-03-11 ENCOUNTER — Ambulatory Visit: Payer: Self-pay | Admitting: Nurse Practitioner

## 2021-03-12 ENCOUNTER — Other Ambulatory Visit: Payer: Self-pay | Admitting: Oncology

## 2021-03-12 ENCOUNTER — Inpatient Hospital Stay (HOSPITAL_BASED_OUTPATIENT_CLINIC_OR_DEPARTMENT_OTHER): Payer: Medicare Other | Admitting: Nurse Practitioner

## 2021-03-12 ENCOUNTER — Inpatient Hospital Stay: Payer: Medicare Other

## 2021-03-12 ENCOUNTER — Other Ambulatory Visit: Payer: Self-pay

## 2021-03-12 ENCOUNTER — Encounter: Payer: Self-pay | Admitting: Nurse Practitioner

## 2021-03-12 VITALS — BP 126/68 | HR 60 | Temp 98.2°F | Resp 20 | Ht 69.0 in | Wt 134.6 lb

## 2021-03-12 DIAGNOSIS — C250 Malignant neoplasm of head of pancreas: Secondary | ICD-10-CM

## 2021-03-12 DIAGNOSIS — Z95828 Presence of other vascular implants and grafts: Secondary | ICD-10-CM

## 2021-03-12 LAB — CBC WITH DIFFERENTIAL (CANCER CENTER ONLY)
Abs Immature Granulocytes: 0.01 10*3/uL (ref 0.00–0.07)
Basophils Absolute: 0 10*3/uL (ref 0.0–0.1)
Basophils Relative: 1 %
Eosinophils Absolute: 0.1 10*3/uL (ref 0.0–0.5)
Eosinophils Relative: 3 %
HCT: 29.5 % — ABNORMAL LOW (ref 39.0–52.0)
Hemoglobin: 9.5 g/dL — ABNORMAL LOW (ref 13.0–17.0)
Immature Granulocytes: 0 %
Lymphocytes Relative: 14 %
Lymphs Abs: 0.6 10*3/uL — ABNORMAL LOW (ref 0.7–4.0)
MCH: 31.8 pg (ref 26.0–34.0)
MCHC: 32.2 g/dL (ref 30.0–36.0)
MCV: 98.7 fL (ref 80.0–100.0)
Monocytes Absolute: 0.3 10*3/uL (ref 0.1–1.0)
Monocytes Relative: 7 %
Neutro Abs: 2.9 10*3/uL (ref 1.7–7.7)
Neutrophils Relative %: 75 %
Platelet Count: 83 10*3/uL — ABNORMAL LOW (ref 150–400)
RBC: 2.99 MIL/uL — ABNORMAL LOW (ref 4.22–5.81)
RDW: 13.9 % (ref 11.5–15.5)
WBC Count: 3.9 10*3/uL — ABNORMAL LOW (ref 4.0–10.5)
nRBC: 0 % (ref 0.0–0.2)

## 2021-03-12 LAB — CMP (CANCER CENTER ONLY)
ALT: 30 U/L (ref 0–44)
AST: 28 U/L (ref 15–41)
Albumin: 4.2 g/dL (ref 3.5–5.0)
Alkaline Phosphatase: 153 U/L — ABNORMAL HIGH (ref 38–126)
Anion gap: 8 (ref 5–15)
BUN: 15 mg/dL (ref 8–23)
CO2: 21 mmol/L — ABNORMAL LOW (ref 22–32)
Calcium: 9.3 mg/dL (ref 8.9–10.3)
Chloride: 110 mmol/L (ref 98–111)
Creatinine: 1.02 mg/dL (ref 0.61–1.24)
GFR, Estimated: >60 mL/min (ref >60)
Glucose, Bld: 119 mg/dL — ABNORMAL HIGH (ref 70–99)
Potassium: 3.4 mmol/L — ABNORMAL LOW (ref 3.5–5.1)
Sodium: 139 mmol/L (ref 135–145)
Total Bilirubin: 0.9 mg/dL (ref 0.3–1.2)
Total Protein: 6.9 g/dL (ref 6.5–8.1)

## 2021-03-12 MED ORDER — HYDROCODONE-ACETAMINOPHEN 5-325 MG PO TABS: 1.0000 | ORAL_TABLET | ORAL | 0 refills | Status: DC | PRN

## 2021-03-12 MED ORDER — SODIUM CHLORIDE 0.9 % IV SOLN
Freq: Once | INTRAVENOUS | Status: AC
  Filled 2021-03-12: qty 250

## 2021-03-12 MED ORDER — SODIUM CHLORIDE 0.9% FLUSH
10.0000 mL | INTRAVENOUS | Status: DC | PRN
  Administered 2021-03-12: 10 mL
  Filled 2021-03-12: qty 10

## 2021-03-12 MED ORDER — SODIUM CHLORIDE 0.9 % IV SOLN
800.0000 mg/m2 | Freq: Once | INTRAVENOUS | Status: AC
  Administered 2021-03-12: 1406 mg via INTRAVENOUS
  Filled 2021-03-12: qty 36.98

## 2021-03-12 MED ORDER — ROMIPLOSTIM 125 MCG ~~LOC~~ SOLR
1.0000 ug/kg | Freq: Once | SUBCUTANEOUS | Status: AC
  Administered 2021-03-12: 60 ug via SUBCUTANEOUS
  Filled 2021-03-12: qty 0.12

## 2021-03-12 MED ORDER — PACLITAXEL PROTEIN-BOUND CHEMO INJECTION 100 MG
100.0000 mg/m2 | Freq: Once | INTRAVENOUS | Status: AC
  Administered 2021-03-12: 175 mg via INTRAVENOUS
  Filled 2021-03-12: qty 35

## 2021-03-12 MED ORDER — HEPARIN SOD (PORK) LOCK FLUSH 100 UNIT/ML IV SOLN
500.0000 [IU] | Freq: Once | INTRAVENOUS | Status: AC | PRN
  Administered 2021-03-12: 500 [IU]
  Filled 2021-03-12: qty 5

## 2021-03-12 MED ORDER — PROCHLORPERAZINE MALEATE 10 MG PO TABS
10.0000 mg | ORAL_TABLET | Freq: Once | ORAL | Status: AC
  Administered 2021-03-12: 10 mg via ORAL
  Filled 2021-03-12: qty 1

## 2021-03-12 NOTE — Patient Instructions (Signed)
[facility]  Discharge Instructions: Thank you for choosing [facility] to provide your oncology and hematology care.   If you have a lab appointment with the [facility], please go directly to the [facility] and check in at the registration area.   Wear comfortable clothing and clothing appropriate for easy access to any Portacath or PICC line.   We strive to give you quality time with your provider. You may need to reschedule your appointment if you arrive late (15 or more minutes).  Arriving late affects you and other patients whose appointments are after yours.  Also, if you miss three or more appointments without notifying the office, you may be dismissed from the clinic at the provider's discretion.      For prescription refill requests, have your pharmacy contact our office and allow 72 hours for refills to be completed.    Today you received the following chemotherapy and/or immunotherapy agents Gemzar and Abraxane       To help prevent nausea and vomiting after your treatment, we encourage you to take your nausea medication as directed. Zofran every 8 hours as needed for nausea, if you need additional nausea meds you can take Compazine every 6 hours  BELOW ARE SYMPTOMS THAT SHOULD BE REPORTED IMMEDIATELY: . *FEVER GREATER THAN 100.4 F (38 C) OR HIGHER . *CHILLS OR SWEATING . *NAUSEA AND VOMITING THAT IS NOT CONTROLLED WITH YOUR NAUSEA MEDICATION . *UNUSUAL SHORTNESS OF BREATH . *UNUSUAL BRUISING OR BLEEDING . *URINARY PROBLEMS (pain or burning when urinating, or frequent urination) . *BOWEL PROBLEMS (unusual diarrhea, constipation, pain near the anus) . TENDERNESS IN MOUTH AND THROAT WITH OR WITHOUT PRESENCE OF ULCERS (sore throat, sores in mouth, or a toothache) . UNUSUAL RASH, SWELLING OR PAIN  . UNUSUAL VAGINAL DISCHARGE OR ITCHING   Items with * indicate a potential emergency and should be followed up as soon as possible or go  to the Emergency Department if any problems should occur.  Please show the CHEMOTHERAPY ALERT CARD or IMMUNOTHERAPY ALERT CARD at check-in to the Emergency Department and triage nurse.  Should you have questions after your visit or need to cancel or reschedule your appointment, please contact [facility]  Dept: [phone]  and follow the prompts.  Office hours are 8:00 a.m. to 4:30 p.m. Monday - Friday. Please note that voicemails left after 4:00 p.m. may not be returned until the following business day.  We are closed weekends and major holidays. You have access to a nurse at all times for urgent questions. Please call the main number to the clinic Dept: [phone] and follow the prompts.   For any non-urgent questions, you may also contact your provider using MyChart. We now offer e-Visits for anyone 73 and older to request care online for non-urgent symptoms. For details visit [email or web address].   Also download the MyChart app! Go to the app store, search "MyChart", open the app, select [facility], and log in with your MyChart username and password.  Due to Covid, a mask is required upon entering the hospital/clinic. If you do not have a mask, one will be given to you upon arrival. For doctor visits, patients may have 1 support person aged 73 or older with them. For treatment visits, patients cannot have anyone with them due to current Covid guidelines and our immunocompromised population.

## 2021-03-12 NOTE — Progress Notes (Signed)
Aaron Johnston   Diagnosis: Pancreas cancer  INTERVAL HISTORY:   Aaron Johnston returns as scheduled.  He received Nplate injections on 08/04/1006 and 03/05/2021.  He is having intermittent abdominal and back pain.  He estimates taking hydrocodone once a day at bedtime.  No nausea or vomiting.  Continued loose stools, 3 to 6/day.  Imodium does not help.  He reports a good appetite.  Objective:  Vital signs in last 24 hours:  Blood pressure 126/68, pulse 60, temperature 98.2 F (36.8 C), temperature source Tympanic, resp. rate 20, height '5\' 9"'  (1.753 m), weight 134 lb 9.6 oz (61.1 kg), SpO2 100 %.    HEENT: No thrush or ulcers. Resp: Lungs clear bilaterally. Cardio: Regular rate and rhythm. GI: Abdomen soft and nontender.  No hepatomegaly. Vascular: No leg edema.  Port-A-Cath without erythema.  Lab Results:  Lab Results  Component Value Date   WBC 3.9 (L) 03/12/2021   HGB 9.5 (L) 03/12/2021   HCT 29.5 (L) 03/12/2021   MCV 98.7 03/12/2021   PLT 83 (L) 03/12/2021   NEUTROABS 2.9 03/12/2021    Imaging:  No results found.  Medications: I have reviewed the patient's current medications.  Assessment/Plan: 1. Pancreas cancer ? CT urology center 03/04/2020-haziness of the fat adjacent to the celiac axis and SMA with focal narrowing of the SMV, splenic vein, and splenoportal confluence ? 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 EUS 07/10/2020-no pancreas mass identified, 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, 1.5 cm aortocaval node biopsy-scant lymphoid material, no  malignancy ? Diagnostic laparoscopy 07/17/2020-segment 3 and 4 liver nodules,adenocarcinoma, positive for pankeratin, CK7 and CK20, partially positive for TTF-1, negative for CDX2 ? Foundation 1-microsatellite stable, tumor mutation burden 4, K-ras G12D, subclonal RB1 alteration ? Elevated CA 19-9 ? Cycle 1 FOLFOX 07/28/2020 ? Cycle 2 FOLFOX 08/18/2020,oxaliplatin dose reduced due to thrombocytopenia ? Cycle 3 FOLFOX 09/08/2020 ? Cycle 4 FOLFOX 09/29/2020 ? Cycle 5 FOLFOX 10/20/2020 ? CTs 11/06/2020-grossly stable infiltrative mass within the pancreatic head and porta hepatis. New small low-density liver lesions. 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 ? Cycle 6 FOLFOX 11/10/2021 ? Cycle 7 FOLFOX 12/01/2020 ? MRI liver 12/04/2020-no significant change in posttreatment appearance of the pancreatic head. Numerous intrinsically low signal hypoenhancing lesions of the liver parenchyma predominantly observed in the right lobe of the liver and liver dome, measuring no greater than 8 mm and as seen on recent prior CT of the abdomen/pelvis. ? Cycle 8 FOLFOX 12/22/2020 ? Cycle 9 FOLFOX 01/12/2021 ? Cycle 10 FOLFOX 02/02/2021 ? MRI liver 02/19/2021-no change in pancreas head mass, multiple hypoenhancing liver lesions are new and increased in size, effacement of portal vein by pancreas head mass with cavernous transformation, no change in mild splenomegaly ? Cycle 1 gemcitabine/Abraxane 03/12/2021  2. Chronic thrombocytopenia- weekly Nplate beginning 12/05/9756; platelet count improved 03/12/2021 3. Diabetes 4. Hypertension 5. 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  He is scheduled to begin treatment with gemcitabine/Abraxane today.  He agrees to  proceed.  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  He began a trial of Nplate 2 weeks ago.  Platelet count is higher today.  Plan to continue weekly Nplate.  We reviewed the CBC and chemistry panel from today.  Labs adequate to proceed with treatment.  Platelet count 83,000, improved.  Continue weekly Nplate as above.  He will contact the office with bleeding.  He has mild hypokalemia.  He will take K dur twice a day for the next 2 days and then resume daily.  New prescription for hydrocodone sent to his pharmacy.  Lab and injection in 1 week.  Lab, follow-up, cycle 2 gemcitabine/Abraxane on 03/27/2021 (date per his request).  He will contact the office in the interim as outlined above or with any other problems.  Plan reviewed with Dr. Benay Spice.    [name] ANP/GNP-BC   03/12/2021  9:44 AM

## 2021-03-13 ENCOUNTER — Other Ambulatory Visit (HOSPITAL_BASED_OUTPATIENT_CLINIC_OR_DEPARTMENT_OTHER): Payer: Self-pay

## 2021-03-13 MED ORDER — PFIZER-BIONT COVID-19 VAC-TRIS 30 MCG/0.3ML IM SUSP
INTRAMUSCULAR | 0 refills | Status: DC
  Filled 2021-03-13: qty 0.3, 1d supply, fill #0

## 2021-03-16 ENCOUNTER — Telehealth: Payer: Self-pay | Admitting: *Deleted

## 2021-03-16 ENCOUNTER — Ambulatory Visit: Payer: Self-pay | Admitting: Oncology

## 2021-03-16 ENCOUNTER — Ambulatory Visit: Payer: Medicare Other

## 2021-03-16 ENCOUNTER — Other Ambulatory Visit: Payer: Medicare Other

## 2021-03-16 ENCOUNTER — Other Ambulatory Visit: Payer: Self-pay

## 2021-03-16 NOTE — Telephone Encounter (Signed)
Called patient to f/u on status since 1st treatment Gemzar/Abraxane on 03/12/21. Left VM for return call.

## 2021-03-17 ENCOUNTER — Encounter: Payer: Self-pay | Admitting: Nurse Practitioner

## 2021-03-17 ENCOUNTER — Other Ambulatory Visit: Payer: Self-pay | Admitting: Oncology

## 2021-03-19 ENCOUNTER — Inpatient Hospital Stay: Payer: Medicare Other | Attending: Genetic Counselor

## 2021-03-19 ENCOUNTER — Inpatient Hospital Stay: Payer: Medicare Other

## 2021-03-19 ENCOUNTER — Telehealth: Payer: Self-pay

## 2021-03-19 ENCOUNTER — Other Ambulatory Visit: Payer: Self-pay

## 2021-03-19 ENCOUNTER — Other Ambulatory Visit: Payer: Self-pay | Admitting: Nurse Practitioner

## 2021-03-19 ENCOUNTER — Encounter: Payer: Self-pay | Admitting: Nurse Practitioner

## 2021-03-19 VITALS — BP 137/84 | HR 69 | Temp 97.7°F | Resp 20 | Ht 69.0 in | Wt 137.1 lb

## 2021-03-19 DIAGNOSIS — C250 Malignant neoplasm of head of pancreas: Secondary | ICD-10-CM

## 2021-03-19 DIAGNOSIS — I10 Essential (primary) hypertension: Secondary | ICD-10-CM | POA: Diagnosis not present

## 2021-03-19 DIAGNOSIS — D696 Thrombocytopenia, unspecified: Secondary | ICD-10-CM | POA: Insufficient documentation

## 2021-03-19 DIAGNOSIS — Z5111 Encounter for antineoplastic chemotherapy: Secondary | ICD-10-CM | POA: Insufficient documentation

## 2021-03-19 DIAGNOSIS — Z95828 Presence of other vascular implants and grafts: Secondary | ICD-10-CM

## 2021-03-19 DIAGNOSIS — E119 Type 2 diabetes mellitus without complications: Secondary | ICD-10-CM | POA: Insufficient documentation

## 2021-03-19 DIAGNOSIS — Z79899 Other long term (current) drug therapy: Secondary | ICD-10-CM | POA: Diagnosis not present

## 2021-03-19 DIAGNOSIS — G893 Neoplasm related pain (acute) (chronic): Secondary | ICD-10-CM | POA: Diagnosis not present

## 2021-03-19 LAB — CBC WITH DIFFERENTIAL (CANCER CENTER ONLY)
Abs Immature Granulocytes: 0 10*3/uL (ref 0.00–0.07)
Basophils Absolute: 0 10*3/uL (ref 0.0–0.1)
Basophils Relative: 0 %
Eosinophils Absolute: 0.1 10*3/uL (ref 0.0–0.5)
Eosinophils Relative: 12 %
HCT: 25.9 % — ABNORMAL LOW (ref 39.0–52.0)
Hemoglobin: 8.4 g/dL — ABNORMAL LOW (ref 13.0–17.0)
Immature Granulocytes: 0 %
Lymphocytes Relative: 38 %
Lymphs Abs: 0.4 10*3/uL — ABNORMAL LOW (ref 0.7–4.0)
MCH: 31.7 pg (ref 26.0–34.0)
MCHC: 32.4 g/dL (ref 30.0–36.0)
MCV: 97.7 fL (ref 80.0–100.0)
Monocytes Absolute: 0 10*3/uL — ABNORMAL LOW (ref 0.1–1.0)
Monocytes Relative: 4 %
Neutro Abs: 0.5 10*3/uL — ABNORMAL LOW (ref 1.7–7.7)
Neutrophils Relative %: 46 %
Platelet Count: 48 10*3/uL — ABNORMAL LOW (ref 150–400)
RBC: 2.65 MIL/uL — ABNORMAL LOW (ref 4.22–5.81)
RDW: 13.3 % (ref 11.5–15.5)
WBC Count: 1 10*3/uL — ABNORMAL LOW (ref 4.0–10.5)
nRBC: 0 % (ref 0.0–0.2)

## 2021-03-19 MED ORDER — HEPARIN SOD (PORK) LOCK FLUSH 100 UNIT/ML IV SOLN
500.0000 [IU] | Freq: Once | INTRAVENOUS | Status: AC | PRN
  Administered 2021-03-19: 500 [IU]
  Filled 2021-03-19: qty 5

## 2021-03-19 MED ORDER — SODIUM CHLORIDE 0.9% FLUSH
10.0000 mL | INTRAVENOUS | Status: DC | PRN
  Administered 2021-03-19: 10 mL
  Filled 2021-03-19: qty 10

## 2021-03-19 MED ORDER — CIPROFLOXACIN HCL 500 MG PO TABS: 500.0000 mg | ORAL_TABLET | Freq: Two times a day (BID) | ORAL | 0 refills | Status: DC

## 2021-03-19 MED ORDER — ROMIPLOSTIM 125 MCG ~~LOC~~ SOLR
1.0000 ug/kg | Freq: Once | SUBCUTANEOUS | Status: AC
Start: 2021-03-19 — End: 2021-03-19
  Administered 2021-03-19: 60 ug via SUBCUTANEOUS
  Filled 2021-03-19: qty 0.12

## 2021-03-19 NOTE — Telephone Encounter (Signed)
Called message left concerning WBC/Platelet counts low per provider please start cipro script sent into pharmacy

## 2021-03-19 NOTE — Telephone Encounter (Signed)
-----   Message from Owens Shark, NP sent at 03/19/2021  2:45 PM EDT ----- Please let Aaron Johnston and his wife know the white count and platelets are low.  Dr. Benay Spice would like for him to begin an antibiotic.  I sent a prescription to his pharmacy for Cipro.  Please instruct him to call the office with fever, chills, other signs of infection, bleeding.

## 2021-03-19 NOTE — Patient Instructions (Addendum)
Romiplostim injection What is this medicine? ROMIPLOSTIM (roe mi PLOE stim) helps your body make more platelets. This medicine is used to treat low platelets caused by chronic idiopathic thrombocytopenic purpura (ITP) or a bone marrow syndrome caused by radiation sickness. This medicine may be used for other purposes; ask your health care provider or pharmacist if you have questions. COMMON BRAND NAME(S): Nplate What should I tell my health care provider before I take this medicine? They need to know if you have any of these conditions:  blood clots  myelodysplastic syndrome  an unusual or allergic reaction to romiplostim, mannitol, other medicines, foods, dyes, or preservatives  pregnant or trying to get pregnant  breast-feeding How should I use this medicine? This medicine is injected under the skin. It is given by a health care provider in a hospital or clinic setting. A special MedGuide will be given to you before each treatment. Be sure to read this information carefully each time. Talk to your health care provider about the use of this medicine in children. While it may be prescribed for children as young as newborns for selected conditions, precautions do apply. Overdosage: If you think you have taken too much of this medicine contact a poison control center or emergency room at once. NOTE: This medicine is only for you. Do not share this medicine with others. What if I miss a dose? Keep appointments for follow-up doses. It is important not to miss your dose. Call your health care provider if you are unable to keep an appointment. What may interact with this medicine? Interactions are not expected. This list may not describe all possible interactions. Give your health care provider a list of all the medicines, herbs, non-prescription drugs, or dietary supplements you use. Also tell them if you smoke, drink alcohol, or use illegal drugs. Some items may interact with your  medicine. What should I watch for while using this medicine? Visit your health care provider for regular checks on your progress. You may need blood work done while you are taking this medicine. Your condition will be monitored carefully while you are receiving this medicine. It is important not to miss any appointments. What side effects may I notice from receiving this medicine? Side effects that you should report to your doctor or health care professional as soon as possible:  allergic reactions (skin rash, itching or hives; swelling of the face, lips, or tongue)  bleeding (bloody or black, tarry stools; red or dark brown urine; spitting up blood or brown material that looks like coffee grounds; red spots on the skin; unusual bruising or bleeding from the eyes, gums, or nose)  blood clot (chest pain; shortness of breath; pain, swelling, or warmth in the leg)  stroke (changes in vision; confusion; trouble speaking or understanding; severe headaches; sudden numbness or weakness of the face, arm or leg; trouble walking; dizziness; loss of balance or coordination) Side effects that usually do not require medical attention (report to your doctor or health care professional if they continue or are bothersome):  diarrhea  dizziness  headache  joint pain  muscle pain  stomach pain  trouble sleeping This list may not describe all possible side effects. Call your doctor for medical advice about side effects. You may report side effects to FDA at 1-800-FDA-1088. Where should I keep my medicine? This medicine is given in a hospital or clinic. It will not be stored at home. NOTE: This sheet is a summary. It may not cover all possible   information. If you have questions about this medicine, talk to your doctor, pharmacist, or health care provider.  2021 Elsevier/Gold Standard (2019-12-17 10:28:13) [facility]  Discharge Instructions: Thank you for choosing [facility] to provide your oncology and hematology care.   If you have a lab appointment with the [facility], please go directly to the [facility] and check in at the registration area.   Wear comfortable clothing and clothing appropriate for easy access to any Portacath or PICC line.   We strive to give you quality time with your provider. You may need to reschedule your appointment if you arrive late (15 or more minutes).  Arriving late affects you and other patients whose appointments are after yours.  Also, if you miss three or more appointments without notifying the office, you may be dismissed from the clinic at the provider's discretion.      For prescription refill requests, have your pharmacy contact our office and allow 72 hours for refills to be completed.    Today you received: nplate   To help prevent nausea and vomiting after your treatment, we encourage you to take your nausea medication as directed.  BELOW ARE SYMPTOMS THAT SHOULD BE REPORTED IMMEDIATELY: . *FEVER GREATER THAN 100.4 F (38 C) OR HIGHER . *CHILLS OR SWEATING . *NAUSEA AND VOMITING THAT IS NOT CONTROLLED WITH YOUR NAUSEA MEDICATION . *UNUSUAL SHORTNESS OF BREATH . *UNUSUAL BRUISING OR BLEEDING . *URINARY PROBLEMS (pain or burning when urinating, or frequent urination) . *BOWEL PROBLEMS (unusual diarrhea, constipation, pain near the anus) . TENDERNESS IN MOUTH AND THROAT WITH OR WITHOUT PRESENCE OF ULCERS (sore throat, sores in mouth, or a toothache) . UNUSUAL RASH, SWELLING OR PAIN  . UNUSUAL VAGINAL DISCHARGE OR ITCHING   Items with * indicate a potential emergency and should be followed up as soon as possible or go to the Emergency Department if any problems should occur.  Please show the CHEMOTHERAPY ALERT CARD or IMMUNOTHERAPY ALERT CARD at check-in to the Emergency Department and triage nurse.  Should you have questions after your visit or need to cancel or reschedule your appointment,  please contact [facility]  Dept: [phone]  and follow the prompts.  Office hours are 8:00 a.m. to 4:30 p.m. Monday - Friday. Please note that voicemails left after 4:00 p.m. may not be returned until the following business day.  We are closed weekends and major holidays. You have access to a nurse at all times for urgent questions. Please call the main number to the clinic Dept: [phone] and follow the prompts.   For any non-urgent questions, you may also contact your provider using MyChart. We now offer e-Visits for anyone 68 and older to request care online for non-urgent symptoms. For details visit [email or web address].   Also download the MyChart app! Go to the app store, search "MyChart", open the app, select [facility], and log in with your MyChart username and password.  Due to Covid, a mask is required upon entering the hospital/clinic. If you do not have a mask, one will be given to you upon arrival. For doctor visits, patients may have 1 support person aged 28 or older with them. For treatment visits, patients cannot have anyone with them due to current Covid guidelines and our immunocompromised population.

## 2021-03-19 NOTE — Progress Notes (Signed)
Patient complained of having a fine rash on his arms and legs and groin area. I looked at his arms and upper thigh areas today - no evidence of a rash today. Patient has not taken any benadryl at this point. Patient states that the itching is worse after he eats. Patient did have some residual tiny scabs on his right upper medial arm from scratching. Dr. Benay Spice notified of no visible rash today.   Patient continues to have 4-5 loose stools daily. Dr. Benay Spice instructed that patient could take 6 Imodium and 6 Lomotil a day to help the loose stools. I recommended alternating between the two meds. This was reviewed with the patient's wife and she wrote down these instructions and repeated them back to me.    Blood results reviewed with patient. Hemoglobin has dropped to 8.4. Instructed patient that if he develops shortness of breath or has increased fatigue to let us know that this might be a sign of another drop in hemoglobin. Wife verbalized understanding. I reviewed the platelets with the patient. Plts 48,000 - nplate given today per orders. I reviewed with the patient and wife that the gemzar/abraxane dropped his platelet count. She verbalized understanding. ANC of 500 reviewed with patient and wife. I instructed patient and wife to continue wearing their masks, washing hands frequently and eating cooked foods, no fresh fruits or vegetables right now until the [place] returns to normal. They verbalized understanding.   Correct phone number and contact information given to wife since she couldn't reach [place] by phone when she called the other day.

## 2021-03-20 NOTE — Telephone Encounter (Signed)
For your review

## 2021-03-22 ENCOUNTER — Other Ambulatory Visit: Payer: Self-pay | Admitting: Oncology

## 2021-03-23 ENCOUNTER — Other Ambulatory Visit: Payer: Self-pay | Admitting: *Deleted

## 2021-03-23 MED ORDER — DIPHENOXYLATE-ATROPINE 2.5-0.025 MG PO TABS: 1.0000 | ORAL_TABLET | Freq: Four times a day (QID) | ORAL | 0 refills | Status: DC | PRN

## 2021-03-25 ENCOUNTER — Ambulatory Visit: Payer: Medicare Other

## 2021-03-25 ENCOUNTER — Other Ambulatory Visit: Payer: Medicare Other

## 2021-03-25 ENCOUNTER — Other Ambulatory Visit: Payer: Self-pay

## 2021-03-27 ENCOUNTER — Inpatient Hospital Stay: Payer: Medicare Other

## 2021-03-27 ENCOUNTER — Inpatient Hospital Stay (HOSPITAL_BASED_OUTPATIENT_CLINIC_OR_DEPARTMENT_OTHER): Payer: Medicare Other | Admitting: Oncology

## 2021-03-27 ENCOUNTER — Other Ambulatory Visit: Payer: Medicare Other

## 2021-03-27 ENCOUNTER — Other Ambulatory Visit: Payer: Self-pay

## 2021-03-27 ENCOUNTER — Ambulatory Visit: Payer: Self-pay

## 2021-03-27 VITALS — BP 120/61 | HR 76 | Temp 97.5°F | Resp 18 | Ht 69.0 in | Wt 135.6 lb

## 2021-03-27 VITALS — BP 128/60 | HR 73 | Temp 97.8°F | Resp 18

## 2021-03-27 DIAGNOSIS — C250 Malignant neoplasm of head of pancreas: Secondary | ICD-10-CM | POA: Diagnosis not present

## 2021-03-27 DIAGNOSIS — Z95828 Presence of other vascular implants and grafts: Secondary | ICD-10-CM

## 2021-03-27 DIAGNOSIS — Z5111 Encounter for antineoplastic chemotherapy: Secondary | ICD-10-CM | POA: Diagnosis not present

## 2021-03-27 LAB — CBC WITH DIFFERENTIAL (CANCER CENTER ONLY)
Abs Immature Granulocytes: 0.01 10*3/uL (ref 0.00–0.07)
Basophils Absolute: 0 10*3/uL (ref 0.0–0.1)
Basophils Relative: 0 %
Eosinophils Absolute: 0.1 10*3/uL (ref 0.0–0.5)
Eosinophils Relative: 3 %
HCT: 27.6 % — ABNORMAL LOW (ref 39.0–52.0)
Hemoglobin: 8.8 g/dL — ABNORMAL LOW (ref 13.0–17.0)
Immature Granulocytes: 0 %
Lymphocytes Relative: 19 %
Lymphs Abs: 0.6 10*3/uL — ABNORMAL LOW (ref 0.7–4.0)
MCH: 31.7 pg (ref 26.0–34.0)
MCHC: 31.9 g/dL (ref 30.0–36.0)
MCV: 99.3 fL (ref 80.0–100.0)
Monocytes Absolute: 0.2 10*3/uL (ref 0.1–1.0)
Monocytes Relative: 7 %
Neutro Abs: 2.3 10*3/uL (ref 1.7–7.7)
Neutrophils Relative %: 71 %
Platelet Count: 79 10*3/uL — ABNORMAL LOW (ref 150–400)
RBC: 2.78 MIL/uL — ABNORMAL LOW (ref 4.22–5.81)
RDW: 13.6 % (ref 11.5–15.5)
WBC Count: 3.2 10*3/uL — ABNORMAL LOW (ref 4.0–10.5)
nRBC: 0 % (ref 0.0–0.2)

## 2021-03-27 LAB — CMP (CANCER CENTER ONLY)
ALT: 26 U/L (ref 0–44)
AST: 25 U/L (ref 15–41)
Albumin: 3.9 g/dL (ref 3.5–5.0)
Alkaline Phosphatase: 174 U/L — ABNORMAL HIGH (ref 38–126)
Anion gap: 7 (ref 5–15)
BUN: 21 mg/dL (ref 8–23)
CO2: 18 mmol/L — ABNORMAL LOW (ref 22–32)
Calcium: 8.6 mg/dL — ABNORMAL LOW (ref 8.9–10.3)
Chloride: 114 mmol/L — ABNORMAL HIGH (ref 98–111)
Creatinine: 1.54 mg/dL — ABNORMAL HIGH (ref 0.61–1.24)
GFR, Estimated: 48 mL/min — ABNORMAL LOW (ref >60)
Glucose, Bld: 121 mg/dL — ABNORMAL HIGH (ref 70–99)
Potassium: 4.2 mmol/L (ref 3.5–5.1)
Sodium: 139 mmol/L (ref 135–145)
Total Bilirubin: 0.6 mg/dL (ref 0.3–1.2)
Total Protein: 6.5 g/dL (ref 6.5–8.1)

## 2021-03-27 MED ORDER — SODIUM CHLORIDE 0.9 % IV SOLN
Freq: Once | INTRAVENOUS | Status: AC
Start: 2021-03-27 — End: 2021-03-27
  Filled 2021-03-27: qty 250

## 2021-03-27 MED ORDER — SODIUM CHLORIDE 0.9% FLUSH
10.0000 mL | INTRAVENOUS | Status: DC | PRN
  Administered 2021-03-27: 10 mL
  Filled 2021-03-27: qty 10

## 2021-03-27 MED ORDER — PROCHLORPERAZINE MALEATE 10 MG PO TABS
10.0000 mg | ORAL_TABLET | Freq: Once | ORAL | Status: AC
  Administered 2021-03-27: 10 mg via ORAL
  Filled 2021-03-27: qty 1

## 2021-03-27 MED ORDER — PACLITAXEL PROTEIN-BOUND CHEMO INJECTION 100 MG
65.0000 mg/m2 | Freq: Once | INTRAVENOUS | Status: AC
  Administered 2021-03-27: 125 mg via INTRAVENOUS
  Filled 2021-03-27: qty 25

## 2021-03-27 MED ORDER — HEPARIN SOD (PORK) LOCK FLUSH 100 UNIT/ML IV SOLN
500.0000 [IU] | Freq: Once | INTRAVENOUS | Status: AC | PRN
Start: 2021-03-27 — End: 2021-03-27
  Administered 2021-03-27: 500 [IU]
  Filled 2021-03-27: qty 5

## 2021-03-27 MED ORDER — SODIUM CHLORIDE 0.9 % IV SOLN
700.0000 mg/m2 | Freq: Once | INTRAVENOUS | Status: AC
  Administered 2021-03-27: 1216 mg via INTRAVENOUS
  Filled 2021-03-27: qty 10.52

## 2021-03-27 MED ORDER — ROMIPLOSTIM 125 MCG ~~LOC~~ SOLR
1.0000 ug/kg | Freq: Once | SUBCUTANEOUS | Status: AC
Start: 2021-03-27 — End: 2021-03-27
  Administered 2021-03-27: 60 ug via SUBCUTANEOUS
  Filled 2021-03-27: qty 0.12

## 2021-03-27 NOTE — Progress Notes (Signed)
Pt here for gemzar/abraxane and n-plate.  Creatinine 1.54 and platelets 79.  Okay for treatment.  Dr Benay Spice is adjusting the dosage of abraxane.   Tolerated treatment well today without incidence.  AVS reviewed and labs printed.  Pt stable during and after treatment.  Discharged in stable condition ambulatory.

## 2021-03-27 NOTE — Progress Notes (Signed)
[facility] OFFICE PROGRESS NOTE   Diagnosis: Pancreas cancer  INTERVAL HISTORY:   Aaron Johnston completed a cycle of gemcitabine/Abraxane on 03/12/2021.  No fever.  He had a fine "rash "over the back and abdomen following chemotherapy.  The rash has resolved.  No associated symptoms.  No bleeding.  He continues weekly Nplate.  The neutrophil count was low on day 8.  Objective:  Vital signs in last 24 hours:  Blood pressure 120/61, pulse 76, temperature (!) 97.5 F (36.4 C), temperature source Oral, resp. rate 18, height '5\' 9"'  (1.753 m), weight 135 lb 9.6 oz (61.5 kg), SpO2 100 %.    HEENT: No thrush or ulcers Resp: Lungs clear bilaterally Cardio: Regular rate and rhythm GI: Nontender, no mass, no hepatosplenomegaly Vascular: No leg edema  Skin: Dryness at the flank area bilaterally without a remaining rash  Portacath/PICC-without erythema  Lab Results:  Lab Results  Component Value Date   WBC 3.2 (L) 03/27/2021   HGB 8.8 (L) 03/27/2021   HCT 27.6 (L) 03/27/2021   MCV 99.3 03/27/2021   PLT 79 (L) 03/27/2021   NEUTROABS 2.3 03/27/2021    CMP  Lab Results  Component Value Date   NA 139 03/12/2021   K 3.4 (L) 03/12/2021   CL 110 03/12/2021   CO2 21 (L) 03/12/2021   GLUCOSE 119 (H) 03/12/2021   BUN 15 03/12/2021   CREATININE 1.02 03/12/2021   CALCIUM 9.3 03/12/2021   PROT 6.9 03/12/2021   ALBUMIN 4.2 03/12/2021   AST 28 03/12/2021   ALT 30 03/12/2021   ALKPHOS 153 (H) 03/12/2021   BILITOT 0.9 03/12/2021   GFRNONAA >60 03/12/2021   GFRAA NOT CALCULATED 02/23/2021     Medications: I have reviewed the patient's current medications.   Assessment/Plan: 1. Pancreas cancer ? CT urology center 03/04/2020-haziness of the fat adjacent to the celiac axis and SMA with focal narrowing of the SMV, splenic vein, and splenoportal confluence ? 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 EUS 07/10/2020-no pancreas mass identified, 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, 1.5 cm aortocaval node biopsy-scant lymphoid material, no malignancy ? Diagnostic laparoscopy 07/17/2020-segment 3 and 4 liver nodules,adenocarcinoma, positive for pankeratin, CK7 and CK20, partially positive for TTF-1, negative for CDX2 ? Foundation 1-microsatellite stable, tumor mutation burden 4, K-ras G12D, subclonal RB1 alteration ? Elevated CA 19-9 ? Cycle 1 FOLFOX 07/28/2020 ? Cycle 2 FOLFOX 08/18/2020,oxaliplatin dose reduced due to thrombocytopenia ? Cycle 3 FOLFOX 09/08/2020 ? Cycle 4 FOLFOX 09/29/2020 ? Cycle 5 FOLFOX 10/20/2020 ? CTs 11/06/2020-grossly stable infiltrative mass within the pancreatic head and porta hepatis. New small low-density liver lesions. 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 ? Cycle 6 FOLFOX 11/10/2021 ? Cycle 7 FOLFOX 12/01/2020 ? MRI liver 12/04/2020-no significant change in posttreatment appearance of the pancreatic head. Numerous intrinsically low signal hypoenhancing lesions of the liver parenchyma predominantly observed in the right lobe of the liver and liver dome, measuring no greater than 8 mm and as seen on recent prior CT of the abdomen/pelvis. ? Cycle 8 FOLFOX 12/22/2020 ? Cycle 9 FOLFOX 01/12/2021 ? Cycle  10 FOLFOX 02/02/2021 ? MRI liver 02/19/2021-no change in pancreas head mass, multiple hypoenhancing liver lesions are new and increased in size, effacement of portal vein by pancreas head mass with cavernous transformation, no change in mild splenomegaly ? Cycle  1 gemcitabine/Abraxane 03/12/2021 ? Cycle 2 gemcitabine/Abraxane 03/27/2021- dose reductions secondary to neutropenia  2. Chronic thrombocytopenia- weekly Nplate beginning 12/15/8655 3. Diabetes 4. Hypertension 5. 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 Beavers appears stable.  He developed neutropenia, increased thrombocytopenia, and a rash following cycle 1 gemcitabine/Abraxane.  He will use Benadryl if he develops a rash following this cycle.  I dose reduced the gemcitabine and Abraxane with chemotherapy today.  He will complete another treatment today.  He continues weekly Nplate.  Mr. Omlor will return for an office visit and chemotherapy in 2 weeks.  Betsy Coder, MD  03/27/2021  9:15 AM

## 2021-03-27 NOTE — Patient Instructions (Signed)
[facility]  Discharge Instructions: Thank you for choosing [facility] to provide your oncology and hematology care.   If you have a lab appointment with the [facility], please go directly to the [facility] and check in at the registration area.   Wear comfortable clothing and clothing appropriate for easy access to any Portacath or PICC line.   We strive to give you quality time with your provider. You may need to reschedule your appointment if you arrive late (15 or more minutes).  Arriving late affects you and other patients whose appointments are after yours.  Also, if you miss three or more appointments without notifying the office, you may be dismissed from the clinic at the provider's discretion.      For prescription refill requests, have your pharmacy contact our office and allow 72 hours for refills to be completed.    Today you received the following chemotherapy and/or immunotherapy agents gemzar/abraxane.  Platelets 79, n-plate given.      To help prevent nausea and vomiting after your treatment, we encourage you to take your nausea medication as directed.  BELOW ARE SYMPTOMS THAT SHOULD BE REPORTED IMMEDIATELY: . *FEVER GREATER THAN 100.4 F (38 C) OR HIGHER . *CHILLS OR SWEATING . *NAUSEA AND VOMITING THAT IS NOT CONTROLLED WITH YOUR NAUSEA MEDICATION . *UNUSUAL SHORTNESS OF BREATH . *UNUSUAL BRUISING OR BLEEDING . *URINARY PROBLEMS (pain or burning when urinating, or frequent urination) . *BOWEL PROBLEMS (unusual diarrhea, constipation, pain near the anus) . TENDERNESS IN MOUTH AND THROAT WITH OR WITHOUT PRESENCE OF ULCERS (sore throat, sores in mouth, or a toothache) . UNUSUAL RASH, SWELLING OR PAIN  . UNUSUAL VAGINAL DISCHARGE OR ITCHING   Items with * indicate a potential emergency and should be followed up as soon as possible or go to the Emergency Department if any problems should occur.  Please show the CHEMOTHERAPY  ALERT CARD or IMMUNOTHERAPY ALERT CARD at check-in to the Emergency Department and triage nurse.  Should you have questions after your visit or need to cancel or reschedule your appointment, please contact [facility]  Dept: [phone]  and follow the prompts.  Office hours are 8:00 a.m. to 4:30 p.m. Monday - Friday. Please note that voicemails left after 4:00 p.m. may not be returned until the following business day.  We are closed weekends and major holidays. You have access to a nurse at all times for urgent questions. Please call the main number to the clinic Dept: [phone] and follow the prompts.   For any non-urgent questions, you may also contact your provider using MyChart. We now offer e-Visits for anyone 22 and older to request care online for non-urgent symptoms. For details visit [email or web address].   Also download the MyChart app! Go to the app store, search "MyChart", open the app, select [facility], and log in with your MyChart username and password.  Due to Covid, a mask is required upon entering the hospital/clinic. If you do not have a mask, one will be given to you upon arrival. For doctor visits, patients may have 1 support person aged 35 or older with them. For treatment visits, patients cannot have anyone with them due to current Covid guidelines and our immunocompromised population.

## 2021-03-28 ENCOUNTER — Other Ambulatory Visit: Payer: Self-pay | Admitting: Oncology

## 2021-03-28 DIAGNOSIS — C250 Malignant neoplasm of head of pancreas: Secondary | ICD-10-CM

## 2021-04-03 ENCOUNTER — Other Ambulatory Visit: Payer: Self-pay | Admitting: Nurse Practitioner

## 2021-04-03 ENCOUNTER — Ambulatory Visit: Payer: Medicare Other

## 2021-04-03 ENCOUNTER — Inpatient Hospital Stay: Payer: Medicare Other

## 2021-04-03 ENCOUNTER — Other Ambulatory Visit: Payer: Self-pay

## 2021-04-03 VITALS — BP 117/66 | HR 66 | Temp 97.6°F | Resp 20 | Wt 134.0 lb

## 2021-04-03 DIAGNOSIS — Z5111 Encounter for antineoplastic chemotherapy: Secondary | ICD-10-CM | POA: Diagnosis not present

## 2021-04-03 DIAGNOSIS — Z95828 Presence of other vascular implants and grafts: Secondary | ICD-10-CM

## 2021-04-03 DIAGNOSIS — C250 Malignant neoplasm of head of pancreas: Secondary | ICD-10-CM

## 2021-04-03 LAB — PREPARE RBC (CROSSMATCH)

## 2021-04-03 LAB — CBC WITH DIFFERENTIAL (CANCER CENTER ONLY)
Abs Immature Granulocytes: 0 10*3/uL (ref 0.00–0.07)
Basophils Absolute: 0 10*3/uL (ref 0.0–0.1)
Basophils Relative: 2 %
Eosinophils Absolute: 0.1 10*3/uL (ref 0.0–0.5)
Eosinophils Relative: 7 %
HCT: 24.5 % — ABNORMAL LOW (ref 39.0–52.0)
Hemoglobin: 7.9 g/dL — ABNORMAL LOW (ref 13.0–17.0)
Immature Granulocytes: 0 %
Lymphocytes Relative: 50 %
Lymphs Abs: 0.4 10*3/uL — ABNORMAL LOW (ref 0.7–4.0)
MCH: 31.5 pg (ref 26.0–34.0)
MCHC: 32.2 g/dL (ref 30.0–36.0)
MCV: 97.6 fL (ref 80.0–100.0)
Monocytes Absolute: 0 10*3/uL — ABNORMAL LOW (ref 0.1–1.0)
Monocytes Relative: 5 %
Neutro Abs: 0.3 10*3/uL — CL (ref 1.7–7.7)
Neutrophils Relative %: 36 %
Platelet Count: 62 10*3/uL — ABNORMAL LOW (ref 150–400)
RBC: 2.51 MIL/uL — ABNORMAL LOW (ref 4.22–5.81)
RDW: 13.6 % (ref 11.5–15.5)
WBC Count: 0.9 10*3/uL — CL (ref 4.0–10.5)
nRBC: 0 % (ref 0.0–0.2)

## 2021-04-03 LAB — ABO/RH: ABO/RH(D): A POS

## 2021-04-03 MED ORDER — HEPARIN SOD (PORK) LOCK FLUSH 100 UNIT/ML IV SOLN
500.0000 [IU] | Freq: Once | INTRAVENOUS | Status: AC | PRN
Start: 2021-04-03 — End: 2021-04-03
  Administered 2021-04-03: 500 [IU]
  Filled 2021-04-03: qty 5

## 2021-04-03 MED ORDER — SODIUM CHLORIDE 0.9% FLUSH
10.0000 mL | INTRAVENOUS | Status: DC | PRN
  Administered 2021-04-03: 10 mL
  Filled 2021-04-03: qty 10

## 2021-04-03 MED ORDER — ROMIPLOSTIM 125 MCG ~~LOC~~ SOLR
1.0000 ug/kg | Freq: Once | SUBCUTANEOUS | Status: AC
Start: 2021-04-03 — End: 2021-04-03
  Administered 2021-04-03: 60 ug via SUBCUTANEOUS
  Filled 2021-04-03: qty 0.12

## 2021-04-03 MED ORDER — CIPROFLOXACIN HCL 500 MG PO TABS: 500.0000 mg | ORAL_TABLET | Freq: Two times a day (BID) | ORAL | 0 refills | Status: AC

## 2021-04-03 NOTE — Patient Instructions (Signed)
[facility]  Discharge Instructions: Thank you for choosing [facility] to provide your oncology and hematology care.   If you have a lab appointment with the [facility], please go directly to the [facility] and check in at the registration area.   Wear comfortable clothing and clothing appropriate for easy access to any Portacath or PICC line.   We strive to give you quality time with your provider. You may need to reschedule your appointment if you arrive late (15 or more minutes).  Arriving late affects you and other patients whose appointments are after yours.  Also, if you miss three or more appointments without notifying the office, you may be dismissed from the clinic at the provider's discretion.      For prescription refill requests, have your pharmacy contact our office and allow 72 hours for refills to be completed.    Today you received Nplate. Please also pick up your prescription for Cipro and begin taking today. Please start neutropenic precautions. See below.    To help prevent nausea and vomiting after your treatment, we encourage you to take your nausea medication as directed.  BELOW ARE SYMPTOMS THAT SHOULD BE REPORTED IMMEDIATELY: . *FEVER GREATER THAN 100.4 F (38 C) OR HIGHER . *CHILLS OR SWEATING . *NAUSEA AND VOMITING THAT IS NOT CONTROLLED WITH YOUR NAUSEA MEDICATION . *UNUSUAL SHORTNESS OF BREATH . *UNUSUAL BRUISING OR BLEEDING . *URINARY PROBLEMS (pain or burning when urinating, or frequent urination) . *BOWEL PROBLEMS (unusual diarrhea, constipation, pain near the anus) . TENDERNESS IN MOUTH AND THROAT WITH OR WITHOUT PRESENCE OF ULCERS (sore throat, sores in mouth, or a toothache) . UNUSUAL RASH, SWELLING OR PAIN  . UNUSUAL VAGINAL DISCHARGE OR ITCHING   Items with * indicate a potential emergency and should be followed up as soon as possible or go to the Emergency Department if any problems should  occur.  Please show the CHEMOTHERAPY ALERT CARD or IMMUNOTHERAPY ALERT CARD at check-in to the Emergency Department and triage nurse.  Should you have questions after your visit or need to cancel or reschedule your appointment, please contact [facility]  Dept: [phone]  and follow the prompts.  Office hours are 8:00 a.m. to 4:30 p.m. Monday - Friday. Please note that voicemails left after 4:00 p.m. may not be returned until the following business day.  We are closed weekends and major holidays. You have access to a nurse at all times for urgent questions. Please call the main number to the clinic Dept: [phone] and follow the prompts.   For any non-urgent questions, you may also contact your provider using MyChart. We now offer e-Visits for anyone 19 and older to request care online for non-urgent symptoms. For details visit [email or web address].   Also download the MyChart app! Go to the app store, search "MyChart", open the app, select [facility], and log in with your MyChart username and password.  Due to Covid, a mask is required upon entering the hospital/clinic. If you do not have a mask, one will be given to you upon arrival. For doctor visits, patients may have 1 support person aged 72 or older with them. For treatment visits, patients cannot have anyone with them due to current Covid guidelines and our immunocompromised population.    Romiplostim injection What is this medicine? ROMIPLOSTIM (roe mi PLOE stim) helps your body make more platelets. This medicine is used to treat low platelets caused by chronic  idiopathic thrombocytopenic purpura (ITP) or a bone marrow syndrome caused by radiation sickness. This medicine may be used for other purposes; ask your health care provider or pharmacist if you have questions. COMMON BRAND NAME(S): Nplate What should I tell my health care provider before I take this medicine? They need to know if you have any  of these conditions:  blood clots  myelodysplastic syndrome  an unusual or allergic reaction to romiplostim, mannitol, other medicines, foods, dyes, or preservatives  pregnant or trying to get pregnant  breast-feeding How should I use this medicine? This medicine is injected under the skin. It is given by a health care provider in a hospital or clinic setting. A special MedGuide will be given to you before each treatment. Be sure to read this information carefully each time. Talk to your health care provider about the use of this medicine in children. While it may be prescribed for children as young as newborns for selected conditions, precautions do apply. Overdosage: If you think you have taken too much of this medicine contact a poison control center or emergency room at once. NOTE: This medicine is only for you. Do not share this medicine with others. What if I miss a dose? Keep appointments for follow-up doses. It is important not to miss your dose. Call your health care provider if you are unable to keep an appointment. What may interact with this medicine? Interactions are not expected. This list may not describe all possible interactions. Give your health care provider a list of all the medicines, herbs, non-prescription drugs, or dietary supplements you use. Also tell them if you smoke, drink alcohol, or use illegal drugs. Some items may interact with your medicine. What should I watch for while using this medicine? Visit your health care provider for regular checks on your progress. You may need blood work done while you are taking this medicine. Your condition will be monitored carefully while you are receiving this medicine. It is important not to miss any appointments. What side effects may I notice from receiving this medicine? Side effects that you should report to your doctor or health care professional as soon as possible:  allergic reactions (skin rash, itching or hives;  swelling of the face, lips, or tongue)  bleeding (bloody or black, tarry stools; red or dark brown urine; spitting up blood or brown material that looks like coffee grounds; red spots on the skin; unusual bruising or bleeding from the eyes, gums, or nose)  blood clot (chest pain; shortness of breath; pain, swelling, or warmth in the leg)  stroke (changes in vision; confusion; trouble speaking or understanding; severe headaches; sudden numbness or weakness of the face, arm or leg; trouble walking; dizziness; loss of balance or coordination) Side effects that usually do not require medical attention (report to your doctor or health care professional if they continue or are bothersome):  diarrhea  dizziness  headache  joint pain  muscle pain  stomach pain  trouble sleeping This list may not describe all possible side effects. Call your doctor for medical advice about side effects. You may report side effects to FDA at 1-800-FDA-1088. Where should I keep my medicine? This medicine is given in a hospital or clinic. It will not be stored at home. NOTE: This sheet is a summary. It may not cover all possible information. If you have questions about this medicine, talk to your doctor, pharmacist, or health care provider.  2021 Elsevier/Gold Standard (2019-12-17 10:28:13)    Neutropenia  Neutropenia is a condition that occurs when you have a lower-than-normal level of a type of white blood cell (neutrophil) in your body. Neutrophils are made in the spongy center of large bones (bone marrow), and they fight infections. Neutrophils are your body's main defense against bacterial and fungal infections. The fewer neutrophils you have and the longer your body remains without them, the greater your risk of getting a severe infection. What are the causes? This condition can occur if your body uses up or destroys neutrophils faster than your bone marrow can make them. Neutropenia may be caused  by:  A bacterial or fungal infection.  Allergic disorders.  Reactions to some medicines.  An autoimmune disease.  An enlarged spleen. This condition can also occur if your bone marrow does not produce enough neutrophils. This problem may be caused by:  Cancer.  Cancer treatments, such as radiation or chemotherapy.  Viral infections.  Medicines, such as phenytoin.  Vitamin B12 deficiency.  Diseases of the bone marrow.  Environmental toxins, such as insecticides. What are the signs or symptoms? This condition does not usually cause symptoms. If symptoms are present, they are usually caused by an underlying infection. Symptoms of an infection may include:  Fever.  Chills.  Swollen glands.  Oral or anal ulcers.  Cough and shortness of breath.  Rash.  Skin infection.  Fatigue. How is this diagnosed? Your health care provider may suspect neutropenia if you have:  A condition that may cause neutropenia.  Symptoms during or after treatment for cancer.  Symptoms of infection, especially fever.  Frequent and unusual infections. This condition is diagnosed based on your medical history and a physical exam. Tests will also be done, such as:  A complete blood count (CBC).  A procedure to collect a sample of bone marrow for examination (bone marrow biopsy).  A chest X-ray.  A urine culture.  A blood culture. How is this treated? Treatment depends on the underlying cause and severity of your condition. Mild neutropenia may not require treatment. Treatment may include medicines, such as:  Antibiotic medicine given through an IV.  Antiviral medicines.  Antifungal medicines.  A medicine to increase neutrophil production (colony-stimulating factor). You may get this drug through an IV or by injection.  Steroids given through an IV. If an underlying condition is causing neutropenia, you may need treatment for that condition. If medicines or cancer treatments  are causing neutropenia, your health care provider may have you stop the medicines or treatment. Follow these instructions at home: Medicines  Take over-the-counter and prescription medicines only as told by your health care provider.  Get a seasonal flu shot (influenza vaccine).  Avoid people who received a vaccine in the past 30 days if that vaccine contained a live version of the germ (live vaccine). You should not get a live vaccine. Common live vaccines are polio, MMR, chicken pox, and shingles vaccines.   Eating and drinking  Do not share food utensils.  Do not eat unpasteurized foods.  Do not eat raw or undercooked meat, eggs, or seafood.  Do not eat unwashed, raw fruits or vegetables. Lifestyle  Avoid exposure to groups of people or children.  Avoid being around people who are sick.  Avoid being around dirt or dust, such as in construction areas or gardens.  Do not provide direct care for pets. Avoid animal droppings. Do not clean litter boxes and bird cages.  Do not have sex unless your health care provider has approved. Hygiene  Bathe daily.  Clean the area between the genitals and the anus (perineal area) after you urinate or have a bowel movement. If you are male, wipe from front to back.  Brush your teeth with a soft toothbrush before and after meals.  Do not use a regular razor. Use an electric razor to remove hair.  Wash your hands often. Make sure others who come in contact with you also wash their hands. If soap and water are not available, use hand sanitizer.   General instructions  Follow any precautions as told by your health care provider to reduce your risk for injury or infection.  Take actions to avoid cuts and burns. For example: ? Be cautious when you use knives. Always cut away from yourself. ? Keep knives in protective sheaths or guards when not in use. ? Use oven mitts when you cook with a hot stove, oven, or grill. ? Stand a safe  distance away from open fires.  Do not use tampons, enemas, or rectal suppositories unless your health care provider has approved.  Keep all follow-up visits as told by your health care provider. This is important. Contact a health care provider if:  You have: ? A sore throat. ? A warm, red, or tender area on your skin. ? A cough. ? Frequent or painful urination. ? Vaginal discharge or itching.  You develop: ? Sores in your mouth or anus. ? Swollen lymph nodes. ? Red streaks on the skin. ? A rash. Get help right away if:  You have: ? A fever. ? Chills, or you start to shake.  You feel: ? Nauseous, or you vomit. ? Very fatigued. ? Short of breath. Summary  Neutropenia is a condition that occurs when you have a lower-than-normal level of a type of white blood cell (neutrophil) in your body.  This condition can occur if your body uses up or destroys neutrophils faster than your bone marrow can make them.  Treatment depends on the underlying cause and severity of your condition. Mild neutropenia may not require treatment.  Follow any precautions as told by your health care provider to reduce your risk for injury or infection. This information is not intended to replace advice given to you by your health care provider. Make sure you discuss any questions you have with your health care provider. Document Revised: 08/17/2018 Document Reviewed: 08/17/2018 Elsevier Patient Education  2021 [employer].

## 2021-04-03 NOTE — Addendum Note (Signed)
Addended by: Gerhard Perches on: 04/03/2021 10:51 AM   Modules accepted: Orders

## 2021-04-03 NOTE — Progress Notes (Signed)
CRITICAL VALUE STICKER  CRITICAL VALUE: WBC 0.9   ANC 0.3  RECEIVER (on-site recipient of call): Lupita Raider RN  DATE & TIME NOTIFIED: 04/03/2021 @ 0945  MESSENGER (representative from lab): Aisha   MD NOTIFIED: Dr. Luberta Robertson NP  [place]: [phone]  RESPONSE: Cipro called in for patient/initiate neutropenic precautions   Noncritical lab value: platelets 62,000  Noncritical lab value: HGB 7.9 and patient symptomatic of low hgb as evidenced by increase in fatigue, having to stop while engaging in activity to "adjust", short winded with activity. 2 Units of PRBCs to be given on Monday. Patient agreeable.

## 2021-04-05 ENCOUNTER — Other Ambulatory Visit: Payer: Self-pay | Admitting: Oncology

## 2021-04-05 NOTE — Progress Notes (Signed)
Will increase dose to 2 mcg/kg per [name] due to lack of response.  Roselind Messier, PharmD

## 2021-04-06 ENCOUNTER — Inpatient Hospital Stay: Payer: Medicare Other

## 2021-04-06 ENCOUNTER — Other Ambulatory Visit: Payer: Self-pay

## 2021-04-06 DIAGNOSIS — C250 Malignant neoplasm of head of pancreas: Secondary | ICD-10-CM

## 2021-04-06 DIAGNOSIS — Z5111 Encounter for antineoplastic chemotherapy: Secondary | ICD-10-CM | POA: Diagnosis not present

## 2021-04-06 MED ORDER — HEPARIN SOD (PORK) LOCK FLUSH 100 UNIT/ML IV SOLN
500.0000 [IU] | Freq: Every day | INTRAVENOUS | Status: AC | PRN
  Administered 2021-04-06: 500 [IU]
  Filled 2021-04-06: qty 5

## 2021-04-06 MED ORDER — SODIUM CHLORIDE 0.9% FLUSH
10.0000 mL | INTRAVENOUS | Status: AC | PRN
Start: 2021-04-06 — End: 2021-04-06
  Administered 2021-04-06: 10 mL
  Filled 2021-04-06: qty 10

## 2021-04-06 MED ORDER — SODIUM CHLORIDE 0.9% IV SOLUTION
250.0000 mL | Freq: Once | INTRAVENOUS | Status: AC
  Administered 2021-04-06: 250 mL via INTRAVENOUS
  Filled 2021-04-06: qty 250

## 2021-04-06 NOTE — Patient Instructions (Signed)

## 2021-04-07 ENCOUNTER — Other Ambulatory Visit: Payer: Self-pay | Admitting: Oncology

## 2021-04-07 LAB — TYPE AND SCREEN
ABO/RH(D): A POS
Antibody Screen: NEGATIVE
Unit division: 0
Unit division: 0

## 2021-04-07 LAB — BPAM RBC
Blood Product Expiration Date: 202206102359
Blood Product Expiration Date: 202206102359
ISSUE DATE / TIME: 202205230744
ISSUE DATE / TIME: 202205230744
Unit Type and Rh: 6200
Unit Type and Rh: 6200

## 2021-04-10 ENCOUNTER — Inpatient Hospital Stay: Payer: Medicare Other

## 2021-04-10 ENCOUNTER — Other Ambulatory Visit: Payer: Self-pay

## 2021-04-10 ENCOUNTER — Other Ambulatory Visit: Payer: Self-pay | Admitting: Oncology

## 2021-04-10 ENCOUNTER — Encounter: Payer: Self-pay | Admitting: Nurse Practitioner

## 2021-04-10 ENCOUNTER — Ambulatory Visit: Payer: Self-pay

## 2021-04-10 ENCOUNTER — Inpatient Hospital Stay (HOSPITAL_BASED_OUTPATIENT_CLINIC_OR_DEPARTMENT_OTHER): Payer: Medicare Other | Admitting: Oncology

## 2021-04-10 VITALS — BP 125/71 | HR 85 | Temp 97.7°F | Resp 18 | Ht 69.0 in | Wt 135.2 lb

## 2021-04-10 DIAGNOSIS — Z95828 Presence of other vascular implants and grafts: Secondary | ICD-10-CM | POA: Diagnosis not present

## 2021-04-10 DIAGNOSIS — C250 Malignant neoplasm of head of pancreas: Secondary | ICD-10-CM

## 2021-04-10 DIAGNOSIS — Z5111 Encounter for antineoplastic chemotherapy: Secondary | ICD-10-CM | POA: Diagnosis not present

## 2021-04-10 LAB — CBC WITH DIFFERENTIAL (CANCER CENTER ONLY)
Abs Immature Granulocytes: 0.01 10*3/uL (ref 0.00–0.07)
Basophils Absolute: 0 10*3/uL (ref 0.0–0.1)
Basophils Relative: 0 %
Eosinophils Absolute: 0.1 10*3/uL (ref 0.0–0.5)
Eosinophils Relative: 3 %
HCT: 31.3 % — ABNORMAL LOW (ref 39.0–52.0)
Hemoglobin: 10.2 g/dL — ABNORMAL LOW (ref 13.0–17.0)
Immature Granulocytes: 0 %
Lymphocytes Relative: 14 %
Lymphs Abs: 0.5 10*3/uL — ABNORMAL LOW (ref 0.7–4.0)
MCH: 31.1 pg (ref 26.0–34.0)
MCHC: 32.6 g/dL (ref 30.0–36.0)
MCV: 95.4 fL (ref 80.0–100.0)
Monocytes Absolute: 0.2 10*3/uL (ref 0.1–1.0)
Monocytes Relative: 7 %
Neutro Abs: 2.4 10*3/uL (ref 1.7–7.7)
Neutrophils Relative %: 76 %
Platelet Count: 47 10*3/uL — ABNORMAL LOW (ref 150–400)
RBC: 3.28 MIL/uL — ABNORMAL LOW (ref 4.22–5.81)
RDW: 14.6 % (ref 11.5–15.5)
WBC Count: 3.2 10*3/uL — ABNORMAL LOW (ref 4.0–10.5)
nRBC: 0 % (ref 0.0–0.2)

## 2021-04-10 LAB — CMP (CANCER CENTER ONLY)
ALT: 48 U/L — ABNORMAL HIGH (ref 0–44)
AST: 37 U/L (ref 15–41)
Albumin: 3.9 g/dL (ref 3.5–5.0)
Alkaline Phosphatase: 200 U/L — ABNORMAL HIGH (ref 38–126)
Anion gap: 10 (ref 5–15)
BUN: 18 mg/dL (ref 8–23)
CO2: 16 mmol/L — ABNORMAL LOW (ref 22–32)
Calcium: 8.4 mg/dL — ABNORMAL LOW (ref 8.9–10.3)
Chloride: 114 mmol/L — ABNORMAL HIGH (ref 98–111)
Creatinine: 1.46 mg/dL — ABNORMAL HIGH (ref 0.61–1.24)
GFR, Estimated: 51 mL/min — ABNORMAL LOW (ref >60)
Glucose, Bld: 166 mg/dL — ABNORMAL HIGH (ref 70–99)
Potassium: 3.8 mmol/L (ref 3.5–5.1)
Sodium: 140 mmol/L (ref 135–145)
Total Bilirubin: 0.8 mg/dL (ref 0.3–1.2)
Total Protein: 6.4 g/dL — ABNORMAL LOW (ref 6.5–8.1)

## 2021-04-10 MED ORDER — ROMIPLOSTIM 125 MCG ~~LOC~~ SOLR
2.0000 ug/kg | Freq: Once | SUBCUTANEOUS | Status: AC
Start: 2021-04-10 — End: 2021-04-10
  Administered 2021-04-10: 125 ug via SUBCUTANEOUS
  Filled 2021-04-10: qty 0.25

## 2021-04-10 MED ORDER — HEPARIN SOD (PORK) LOCK FLUSH 100 UNIT/ML IV SOLN
500.0000 [IU] | Freq: Once | INTRAVENOUS | Status: AC | PRN
  Administered 2021-04-10: 500 [IU]
  Filled 2021-04-10: qty 5

## 2021-04-10 NOTE — Progress Notes (Signed)
[facility] OFFICE PROGRESS NOTE   Diagnosis: Pancreas cancer  INTERVAL HISTORY:   Aaron Johnston returns as scheduled.  He feels better after receiving the red cell transfusions on 04/06/2021.  He completed another treatment with gemcitabine/Abraxane on 03/27/2021.  He developed a rash after chemotherapy.  The rash improved with Benadryl.  No associated symptoms.  No pain.  He continues Nplate.  He developed severe neutropenia last week and was placed on ciprofloxacin prophylaxis.  Mr. Gust continues to have diarrhea up to 5 times per day despite Creon, Imodium, and Lomotil.  Diarrhea occurs after eating.  Objective:  Vital signs in last 24 hours:  Blood pressure 125/71, pulse 85, temperature 97.7 F (36.5 C), temperature source Oral, resp. rate 18, height '5\' 9"'  (1.753 m), weight 135 lb 3.2 oz (61.3 kg), SpO2 99 %.    HEENT: No thrush or ulcers Resp: Lungs clear bilaterally Cardio: Regular rate and rhythm GI: Nontender, no mass, no hepatosplenomegaly, no apparent ascites Vascular: No leg edema    Portacath/PICC-without erythema  Lab Results:  Lab Results  Component Value Date   WBC 3.2 (L) 04/10/2021   HGB 10.2 (L) 04/10/2021   HCT 31.3 (L) 04/10/2021   MCV 95.4 04/10/2021   PLT 47 (L) 04/10/2021   NEUTROABS 2.4 04/10/2021    CMP  Lab Results  Component Value Date   NA 140 04/10/2021   K 3.8 04/10/2021   CL 114 (H) 04/10/2021   CO2 16 (L) 04/10/2021   GLUCOSE 166 (H) 04/10/2021   BUN 18 04/10/2021   CREATININE 1.46 (H) 04/10/2021   CALCIUM 8.4 (L) 04/10/2021   PROT 6.4 (L) 04/10/2021   ALBUMIN 3.9 04/10/2021   AST 37 04/10/2021   ALT 48 (H) 04/10/2021   ALKPHOS 200 (H) 04/10/2021   BILITOT 0.8 04/10/2021   GFRNONAA 51 (L) 04/10/2021   GFRAA NOT CALCULATED 02/23/2021     Medications: I have reviewed the patient's current medications.   Assessment/Plan: 1. Pancreas cancer ? CT urology center 03/04/2020-haziness of the fat adjacent to  the celiac axis and SMA with focal narrowing of the SMV, splenic vein, and splenoportal confluence ? 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 EUS 07/10/2020-no pancreas mass identified, 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, 1.5 cm aortocaval node biopsy-scant lymphoid material, no malignancy ? Diagnostic laparoscopy 07/17/2020-segment 3 and 4 liver nodules,adenocarcinoma, positive for pankeratin, CK7 and CK20, partially positive for TTF-1, negative for CDX2 ? Foundation 1-microsatellite stable, tumor mutation burden 4, K-ras G12D, subclonal RB1 alteration ? Elevated CA 19-9 ? Cycle 1 FOLFOX 07/28/2020 ? Cycle 2 FOLFOX 08/18/2020,oxaliplatin dose reduced due to thrombocytopenia ? Cycle 3 FOLFOX 09/08/2020 ? Cycle 4 FOLFOX 09/29/2020 ? Cycle 5 FOLFOX 10/20/2020 ? CTs 11/06/2020-grossly stable infiltrative mass within the pancreatic head and porta hepatis. New small low-density liver lesions. 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 ? Cycle 6 FOLFOX 11/10/2021 ? Cycle 7 FOLFOX 12/01/2020 ? MRI liver 12/04/2020-no significant change in posttreatment appearance of the pancreatic head. Numerous intrinsically low signal hypoenhancing lesions of the liver parenchyma predominantly observed in the right lobe of the  liver and liver dome, measuring no greater than 8 mm and as seen on recent prior CT of the abdomen/pelvis. ? Cycle 8 FOLFOX 12/22/2020 ? Cycle 9 FOLFOX 01/12/2021 ? Cycle 10 FOLFOX 02/02/2021 ? MRI liver  02/19/2021-no change in pancreas head mass, multiple hypoenhancing liver lesions are new and increased in size, effacement of portal vein by pancreas head mass with cavernous transformation, no change in mild splenomegaly ? Cycle 1 gemcitabine/Abraxane 03/12/2021 ? Cycle 2 gemcitabine/Abraxane 03/27/2021- dose reductions secondary to neutropenia  2. Chronic thrombocytopenia- weekly Nplate beginning 04/13/510 3. Diabetes 4. Hypertension 5. 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  He is currently being treated with gemcitabine/Abraxane chemotherapy.  Treatment has been complicated by development of neutropenia and progressive thrombocytopenia.  He is receiving Nplate.  He had thrombocytopenia prior to beginning chemotherapy last year.  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  Chemotherapy will be held today secondary to the degree of thrombocytopenia.  He will continue weekly Nplate.  We increased the Nplate dose today.  He will return for an office visit and another cycle of gemcitabine/Abraxane in 1 week if the neutrophil count and platelets are adequate.  I will dose reduce the gemcitabine and Abraxane again.  We will follow up on the CA 19-9 from today.  The plan is to complete 5 cycles of gemcitabine/Abraxane prior to a repeat MRI of the liver.  We will refer him to his gastroenterology at [facility] to evaluate the persistent diarrhea.  Betsy Coder, MD  04/10/2021  10:46 AM

## 2021-04-10 NOTE — Patient Instructions (Signed)
Romiplostim injection What is this medicine? ROMIPLOSTIM (roe mi PLOE stim) helps your body make more platelets. This medicine is used to treat low platelets caused by chronic idiopathic thrombocytopenic purpura (ITP) or a bone marrow syndrome caused by radiation sickness. This medicine may be used for other purposes; ask your health care provider or pharmacist if you have questions. COMMON BRAND NAME(S): Nplate What should I tell my health care provider before I take this medicine? They need to know if you have any of these conditions:  blood clots  myelodysplastic syndrome  an unusual or allergic reaction to romiplostim, mannitol, other medicines, foods, dyes, or preservatives  pregnant or trying to get pregnant  breast-feeding How should I use this medicine? This medicine is injected under the skin. It is given by a health care provider in a hospital or clinic setting. A special MedGuide will be given to you before each treatment. Be sure to read this information carefully each time. Talk to your health care provider about the use of this medicine in children. While it may be prescribed for children as young as newborns for selected conditions, precautions do apply. Overdosage: If you think you have taken too much of this medicine contact a poison control center or emergency room at once. NOTE: This medicine is only for you. Do not share this medicine with others. What if I miss a dose? Keep appointments for follow-up doses. It is important not to miss your dose. Call your health care provider if you are unable to keep an appointment. What may interact with this medicine? Interactions are not expected. This list may not describe all possible interactions. Give your health care provider a list of all the medicines, herbs, non-prescription drugs, or dietary supplements you use. Also tell them if you smoke, drink alcohol, or use illegal drugs. Some items may interact with your  medicine. What should I watch for while using this medicine? Visit your health care provider for regular checks on your progress. You may need blood work done while you are taking this medicine. Your condition will be monitored carefully while you are receiving this medicine. It is important not to miss any appointments. What side effects may I notice from receiving this medicine? Side effects that you should report to your doctor or health care professional as soon as possible:  allergic reactions (skin rash, itching or hives; swelling of the face, lips, or tongue)  bleeding (bloody or black, tarry stools; red or dark brown urine; spitting up blood or brown material that looks like coffee grounds; red spots on the skin; unusual bruising or bleeding from the eyes, gums, or nose)  blood clot (chest pain; shortness of breath; pain, swelling, or warmth in the leg)  stroke (changes in vision; confusion; trouble speaking or understanding; severe headaches; sudden numbness or weakness of the face, arm or leg; trouble walking; dizziness; loss of balance or coordination) Side effects that usually do not require medical attention (report to your doctor or health care professional if they continue or are bothersome):  diarrhea  dizziness  headache  joint pain  muscle pain  stomach pain  trouble sleeping This list may not describe all possible side effects. Call your doctor for medical advice about side effects. You may report side effects to FDA at 1-800-FDA-1088. Where should I keep my medicine? This medicine is given in a hospital or clinic. It will not be stored at home. NOTE: This sheet is a summary. It may not cover all possible   information. If you have questions about this medicine, talk to your doctor, pharmacist, or health care provider.  2021 [employer]/Gold Standard (2019-12-17 10:28:13)  

## 2021-04-11 LAB — CANCER ANTIGEN 19-9: CA 19-9: 2594 U/mL — ABNORMAL HIGH (ref 0–35)

## 2021-04-14 ENCOUNTER — Telehealth: Payer: Self-pay | Admitting: *Deleted

## 2021-04-14 ENCOUNTER — Encounter: Payer: Self-pay | Admitting: Nurse Practitioner

## 2021-04-14 ENCOUNTER — Encounter: Payer: Self-pay | Admitting: Oncology

## 2021-04-14 NOTE — Telephone Encounter (Signed)
Called to report he has a toothache and seeing dentist tomorrow. Asking for precautions to take. Informed her to tell dentist that platelets are very low and wbc is low, which will pre-dispose him for infection and significant bleeding. Tell dentist to call office with any questions. She understands and agrees.  Also requested refill on Lomotil and this was completed.

## 2021-04-15 ENCOUNTER — Other Ambulatory Visit: Payer: Self-pay | Admitting: Nurse Practitioner

## 2021-04-15 ENCOUNTER — Telehealth: Payer: Self-pay | Admitting: *Deleted

## 2021-04-15 NOTE — Telephone Encounter (Signed)
Patient seen in office today and has infection in tooth. Needs to begin antibiotic today and asking if this is OK? Also will need extraction ~ 10-14 days afterwards. Will he need any special precautions? Next chemo 04/17/21. Office will fax a clearance form for written instructions.

## 2021-04-15 NOTE — Telephone Encounter (Signed)
Faxed completed form to dental practice [phone]. OK for antibiotics. Check CBC day prior to extraction. Have patient call to schedule lab appointment.

## 2021-04-16 ENCOUNTER — Other Ambulatory Visit: Payer: Self-pay | Admitting: Oncology

## 2021-04-17 ENCOUNTER — Ambulatory Visit: Payer: Medicare Other

## 2021-04-17 ENCOUNTER — Encounter: Payer: Self-pay | Admitting: Nurse Practitioner

## 2021-04-17 ENCOUNTER — Other Ambulatory Visit: Payer: Self-pay

## 2021-04-17 ENCOUNTER — Other Ambulatory Visit: Payer: Self-pay | Admitting: Nurse Practitioner

## 2021-04-17 ENCOUNTER — Other Ambulatory Visit: Payer: Medicare Other

## 2021-04-17 ENCOUNTER — Inpatient Hospital Stay: Payer: Medicare Other

## 2021-04-17 ENCOUNTER — Inpatient Hospital Stay: Payer: Medicare Other | Attending: Genetic Counselor

## 2021-04-17 ENCOUNTER — Inpatient Hospital Stay (HOSPITAL_BASED_OUTPATIENT_CLINIC_OR_DEPARTMENT_OTHER): Payer: Medicare Other | Admitting: Nurse Practitioner

## 2021-04-17 ENCOUNTER — Ambulatory Visit: Payer: Self-pay

## 2021-04-17 VITALS — BP 126/67 | HR 76 | Temp 97.8°F | Resp 20 | Ht 69.0 in | Wt 133.8 lb

## 2021-04-17 DIAGNOSIS — E119 Type 2 diabetes mellitus without complications: Secondary | ICD-10-CM | POA: Diagnosis not present

## 2021-04-17 DIAGNOSIS — D696 Thrombocytopenia, unspecified: Secondary | ICD-10-CM | POA: Diagnosis not present

## 2021-04-17 DIAGNOSIS — D701 Agranulocytosis secondary to cancer chemotherapy: Secondary | ICD-10-CM | POA: Diagnosis not present

## 2021-04-17 DIAGNOSIS — T451X5A Adverse effect of antineoplastic and immunosuppressive drugs, initial encounter: Secondary | ICD-10-CM | POA: Diagnosis not present

## 2021-04-17 DIAGNOSIS — Z79899 Other long term (current) drug therapy: Secondary | ICD-10-CM | POA: Diagnosis not present

## 2021-04-17 DIAGNOSIS — Z95828 Presence of other vascular implants and grafts: Secondary | ICD-10-CM

## 2021-04-17 DIAGNOSIS — C250 Malignant neoplasm of head of pancreas: Secondary | ICD-10-CM

## 2021-04-17 DIAGNOSIS — Z5111 Encounter for antineoplastic chemotherapy: Secondary | ICD-10-CM | POA: Diagnosis present

## 2021-04-17 DIAGNOSIS — I10 Essential (primary) hypertension: Secondary | ICD-10-CM | POA: Diagnosis not present

## 2021-04-17 LAB — CBC WITH DIFFERENTIAL (CANCER CENTER ONLY)
Abs Immature Granulocytes: 0.01 10*3/uL (ref 0.00–0.07)
Basophils Absolute: 0 10*3/uL (ref 0.0–0.1)
Basophils Relative: 0 %
Eosinophils Absolute: 0.1 10*3/uL (ref 0.0–0.5)
Eosinophils Relative: 2 %
HCT: 31.3 % — ABNORMAL LOW (ref 39.0–52.0)
Hemoglobin: 10.2 g/dL — ABNORMAL LOW (ref 13.0–17.0)
Immature Granulocytes: 0 %
Lymphocytes Relative: 13 %
Lymphs Abs: 0.4 10*3/uL — ABNORMAL LOW (ref 0.7–4.0)
MCH: 30.8 pg (ref 26.0–34.0)
MCHC: 32.6 g/dL (ref 30.0–36.0)
MCV: 94.6 fL (ref 80.0–100.0)
Monocytes Absolute: 0.3 10*3/uL (ref 0.1–1.0)
Monocytes Relative: 9 %
Neutro Abs: 2.6 10*3/uL (ref 1.7–7.7)
Neutrophils Relative %: 76 %
Platelet Count: 79 10*3/uL — ABNORMAL LOW (ref 150–400)
RBC: 3.31 MIL/uL — ABNORMAL LOW (ref 4.22–5.81)
RDW: 13.9 % (ref 11.5–15.5)
WBC Count: 3.4 10*3/uL — ABNORMAL LOW (ref 4.0–10.5)
nRBC: 0 % (ref 0.0–0.2)

## 2021-04-17 LAB — CMP (CANCER CENTER ONLY)
ALT: 58 U/L — ABNORMAL HIGH (ref 0–44)
AST: 57 U/L — ABNORMAL HIGH (ref 15–41)
Albumin: 4.2 g/dL (ref 3.5–5.0)
Alkaline Phosphatase: 188 U/L — ABNORMAL HIGH (ref 38–126)
Anion gap: 10 (ref 5–15)
BUN: 22 mg/dL (ref 8–23)
CO2: 18 mmol/L — ABNORMAL LOW (ref 22–32)
Calcium: 9.2 mg/dL (ref 8.9–10.3)
Chloride: 112 mmol/L — ABNORMAL HIGH (ref 98–111)
Creatinine: 1.57 mg/dL — ABNORMAL HIGH (ref 0.61–1.24)
GFR, Estimated: 47 mL/min — ABNORMAL LOW (ref >60)
Glucose, Bld: 132 mg/dL — ABNORMAL HIGH (ref 70–99)
Potassium: 3.9 mmol/L (ref 3.5–5.1)
Sodium: 140 mmol/L (ref 135–145)
Total Bilirubin: 0.9 mg/dL (ref 0.3–1.2)
Total Protein: 7 g/dL (ref 6.5–8.1)

## 2021-04-17 MED ORDER — SODIUM CHLORIDE 0.9 % IV SOLN
500.0000 mg/m2 | Freq: Once | INTRAVENOUS | Status: AC
  Administered 2021-04-17: 874 mg via INTRAVENOUS
  Filled 2021-04-17: qty 22.99

## 2021-04-17 MED ORDER — ROMIPLOSTIM 125 MCG ~~LOC~~ SOLR
125.0000 ug | Freq: Once | SUBCUTANEOUS | Status: AC
Start: 2021-04-17 — End: 2021-04-17
  Administered 2021-04-17: 125 ug via SUBCUTANEOUS
  Filled 2021-04-17: qty 0.25

## 2021-04-17 MED ORDER — SODIUM CHLORIDE 0.9% FLUSH
10.0000 mL | INTRAVENOUS | Status: DC | PRN
  Administered 2021-04-17: 10 mL
  Filled 2021-04-17: qty 10

## 2021-04-17 MED ORDER — HEPARIN SOD (PORK) LOCK FLUSH 100 UNIT/ML IV SOLN
500.0000 [IU] | Freq: Once | INTRAVENOUS | Status: AC | PRN
  Administered 2021-04-17: 500 [IU]
  Filled 2021-04-17: qty 5

## 2021-04-17 MED ORDER — SODIUM CHLORIDE 0.9 % IV SOLN
Freq: Once | INTRAVENOUS | Status: AC
Start: 2021-04-17 — End: 2021-04-17
  Filled 2021-04-17: qty 250

## 2021-04-17 MED ORDER — PROCHLORPERAZINE MALEATE 10 MG PO TABS
10.0000 mg | ORAL_TABLET | Freq: Once | ORAL | Status: AC
Start: 2021-04-17 — End: 2021-04-17
  Administered 2021-04-17: 10 mg via ORAL
  Filled 2021-04-17: qty 1

## 2021-04-17 MED ORDER — PACLITAXEL PROTEIN-BOUND CHEMO INJECTION 100 MG
45.0000 mg/m2 | Freq: Once | INTRAVENOUS | Status: AC
  Administered 2021-04-17: 75 mg via INTRAVENOUS
  Filled 2021-04-17: qty 15

## 2021-04-17 NOTE — Patient Instructions (Signed)
[facility]  Discharge Instructions: Thank you for choosing [facility] to provide your oncology and hematology care.   If you have a lab appointment with the [facility], please go directly to the [facility] and check in at the registration area.   Wear comfortable clothing and clothing appropriate for easy access to any Portacath or PICC line.   We strive to give you quality time with your provider. You may need to reschedule your appointment if you arrive late (15 or more minutes).  Arriving late affects you and other patients whose appointments are after yours.  Also, if you miss three or more appointments without notifying the office, you may be dismissed from the clinic at the provider's discretion.      For prescription refill requests, have your pharmacy contact our office and allow 72 hours for refills to be completed.    Today you received the following chemotherapy and/or immunotherapy agents paclitaxel-protein bound, gemcitabine, nplate      To help prevent nausea and vomiting after your treatment, we encourage you to take your nausea medication as directed.  BELOW ARE SYMPTOMS THAT SHOULD BE REPORTED IMMEDIATELY: . *FEVER GREATER THAN 100.4 F (38 C) OR HIGHER . *CHILLS OR SWEATING . *NAUSEA AND VOMITING THAT IS NOT CONTROLLED WITH YOUR NAUSEA MEDICATION . *UNUSUAL SHORTNESS OF BREATH . *UNUSUAL BRUISING OR BLEEDING . *URINARY PROBLEMS (pain or burning when urinating, or frequent urination) . *BOWEL PROBLEMS (unusual diarrhea, constipation, pain near the anus) . TENDERNESS IN MOUTH AND THROAT WITH OR WITHOUT PRESENCE OF ULCERS (sore throat, sores in mouth, or a toothache) . UNUSUAL RASH, SWELLING OR PAIN  . UNUSUAL VAGINAL DISCHARGE OR ITCHING   Items with * indicate a potential emergency and should be followed up as soon as possible or go to the Emergency Department if any problems should occur.  Please show the CHEMOTHERAPY  ALERT CARD or IMMUNOTHERAPY ALERT CARD at check-in to the Emergency Department and triage nurse.  Should you have questions after your visit or need to cancel or reschedule your appointment, please contact [facility]  Dept: [phone]  and follow the prompts.  Office hours are 8:00 a.m. to 4:30 p.m. Monday - Friday. Please note that voicemails left after 4:00 p.m. may not be returned until the following business day.  We are closed weekends and major holidays. You have access to a nurse at all times for urgent questions. Please call the main number to the clinic Dept: [phone] and follow the prompts.   For any non-urgent questions, you may also contact your provider using MyChart. We now offer e-Visits for anyone 65 and older to request care online for non-urgent symptoms. For details visit [email or web address].   Also download the MyChart app! Go to the app store, search "MyChart", open the app, select [facility], and log in with your MyChart username and password.  Due to Covid, a mask is required upon entering the hospital/clinic. If you do not have a mask, one will be given to you upon arrival. For doctor visits, patients may have 1 support person aged 4 or older with them. For treatment visits, patients cannot have anyone with them due to current Covid guidelines and our immunocompromised population.   Nanoparticle Albumin-Bound Paclitaxel injection What is this medicine? NANOPARTICLE ALBUMIN-BOUND PACLITAXEL (Na no PAHR ti kuhl al BYOO muhn-bound PAK li TAX el) is a chemotherapy drug. It targets fast dividing cells, like cancer cells, and  causes these cells to die. This medicine is used to treat advanced breast cancer, lung cancer, and pancreatic cancer. This medicine may be used for other purposes; ask your health care provider or pharmacist if you have questions. COMMON BRAND NAME(S): Abraxane What should I tell my health care provider before I take this  medicine? They need to know if you have any of these conditions:  kidney disease  liver disease  low blood counts, like low white cell, platelet, or red cell counts  lung or breathing disease, like asthma  tingling of the fingers or toes, or other nerve disorder  an unusual or allergic reaction to paclitaxel, albumin, other chemotherapy, other medicines, foods, dyes, or preservatives  pregnant or trying to get pregnant  breast-feeding How should I use this medicine? This drug is given as an infusion into a vein. It is administered in a hospital or clinic by a specially trained health care professional. Talk to your pediatrician regarding the use of this medicine in children. Special care may be needed. Overdosage: If you think you have taken too much of this medicine contact a poison control center or emergency room at once. NOTE: This medicine is only for you. Do not share this medicine with others. What if I miss a dose? It is important not to miss your dose. Call your doctor or health care professional if you are unable to keep an appointment. What may interact with this medicine? This medicine may interact with the following medications:  antiviral medicines for hepatitis, HIV or AIDS  certain antibiotics like erythromycin and clarithromycin  certain medicines for fungal infections like ketoconazole and itraconazole  certain medicines for seizures like carbamazepine, phenobarbital, phenytoin  gemfibrozil  nefazodone  rifampin  St. John's wort This list may not describe all possible interactions. Give your health care provider a list of all the medicines, herbs, non-prescription drugs, or dietary supplements you use. Also tell them if you smoke, drink alcohol, or use illegal drugs. Some items may interact with your medicine. What should I watch for while using this medicine? Your condition will be monitored carefully while you are receiving this medicine. You will need  important blood work done while you are taking this medicine. This medicine can cause serious allergic reactions. If you experience allergic reactions like skin rash, itching or hives, swelling of the face, lips, or tongue, tell your doctor or health care professional right away. In some cases, you may be given additional medicines to help with side effects. Follow all directions for their use. This drug may make you feel generally unwell. This is not uncommon, as chemotherapy can affect healthy cells as well as cancer cells. Report any side effects. Continue your course of treatment even though you feel ill unless your doctor tells you to stop. Call your doctor or health care professional for advice if you get a fever, chills or sore throat, or other symptoms of a cold or flu. Do not treat yourself. This drug decreases your body's ability to fight infections. Try to avoid being around people who are sick. This medicine may increase your risk to bruise or bleed. Call your doctor or health care professional if you notice any unusual bleeding. Be careful brushing and flossing your teeth or using a toothpick because you may get an infection or bleed more easily. If you have any dental work done, tell your dentist you are receiving this medicine. Avoid taking products that contain aspirin, acetaminophen, ibuprofen, naproxen, or ketoprofen unless instructed  by your doctor. These medicines may hide a fever. Do not become pregnant while taking this medicine or for 6 months after stopping it. Women should inform their doctor if they wish to become pregnant or think they might be pregnant. Men should not father a child while taking this medicine or for 3 months after stopping it. There is a potential for serious side effects to an unborn child. Talk to your health care professional or pharmacist for more information. Do not breast-feed an infant while taking this medicine or for 2 weeks after stopping it. This  medicine may interfere with the ability to get pregnant or to father a child. You should talk to your doctor or health care professional if you are concerned about your fertility. What side effects may I notice from receiving this medicine? Side effects that you should report to your doctor or health care professional as soon as possible:  allergic reactions like skin rash, itching or hives, swelling of the face, lips, or tongue  breathing problems  changes in vision  fast, irregular heartbeat  low blood pressure  mouth sores  pain, tingling, numbness in the hands or feet  signs of decreased platelets or bleeding - bruising, pinpoint red spots on the skin, black, tarry stools, blood in the urine  signs of decreased red blood cells - unusually weak or tired, feeling faint or lightheaded, falls  signs of infection - fever or chills, cough, sore throat, pain or difficulty passing urine  signs and symptoms of liver injury like dark yellow or brown urine; general ill feeling or flu-like symptoms; light-colored stools; loss of appetite; nausea; right upper belly pain; unusually weak or tired; yellowing of the eyes or skin  swelling of the ankles, feet, hands  unusually slow heartbeat Side effects that usually do not require medical attention (report to your doctor or health care professional if they continue or are bothersome):  diarrhea  hair loss  loss of appetite  nausea, vomiting  tiredness This list may not describe all possible side effects. Call your doctor for medical advice about side effects. You may report side effects to FDA at 1-800-FDA-1088. Where should I keep my medicine? This drug is given in a hospital or clinic and will not be stored at home. NOTE: This sheet is a summary. It may not cover all possible information. If you have questions about this medicine, talk to your doctor, pharmacist, or health care provider.  2021 Elsevier/Gold Standard (2017-07-05  13:03:45)  Gemcitabine injection What is this medicine? GEMCITABINE (jem SYE ta been) is a chemotherapy drug. This medicine is used to treat many types of cancer like breast cancer, lung cancer, pancreatic cancer, and ovarian cancer. This medicine may be used for other purposes; ask your health care provider or pharmacist if you have questions. COMMON BRAND NAME(S): Gemzar, Infugem What should I tell my health care provider before I take this medicine? They need to know if you have any of these conditions:  blood disorders  infection  kidney disease  liver disease  lung or breathing disease, like asthma  recent or ongoing radiation therapy  an unusual or allergic reaction to gemcitabine, other chemotherapy, other medicines, foods, dyes, or preservatives  pregnant or trying to get pregnant  breast-feeding How should I use this medicine? This drug is given as an infusion into a vein. It is administered in a hospital or clinic by a specially trained health care professional. Talk to your pediatrician regarding the use of this  medicine in children. Special care may be needed. Overdosage: If you think you have taken too much of this medicine contact a poison control center or emergency room at once. NOTE: This medicine is only for you. Do not share this medicine with others. What if I miss a dose? It is important not to miss your dose. Call your doctor or health care professional if you are unable to keep an appointment. What may interact with this medicine?  medicines to increase blood counts like filgrastim, pegfilgrastim, sargramostim  some other chemotherapy drugs like cisplatin  vaccines Talk to your doctor or health care professional before taking any of these medicines:  acetaminophen  aspirin  ibuprofen  ketoprofen  naproxen This list may not describe all possible interactions. Give your health care provider a list of all the medicines, herbs, non-prescription  drugs, or dietary supplements you use. Also tell them if you smoke, drink alcohol, or use illegal drugs. Some items may interact with your medicine. What should I watch for while using this medicine? Visit your doctor for checks on your progress. This drug may make you feel generally unwell. This is not uncommon, as chemotherapy can affect healthy cells as well as cancer cells. Report any side effects. Continue your course of treatment even though you feel ill unless your doctor tells you to stop. In some cases, you may be given additional medicines to help with side effects. Follow all directions for their use. Call your doctor or health care professional for advice if you get a fever, chills or sore throat, or other symptoms of a cold or flu. Do not treat yourself. This drug decreases your body's ability to fight infections. Try to avoid being around people who are sick. This medicine may increase your risk to bruise or bleed. Call your doctor or health care professional if you notice any unusual bleeding. Be careful brushing and flossing your teeth or using a toothpick because you may get an infection or bleed more easily. If you have any dental work done, tell your dentist you are receiving this medicine. Avoid taking products that contain aspirin, acetaminophen, ibuprofen, naproxen, or ketoprofen unless instructed by your doctor. These medicines may hide a fever. Do not become pregnant while taking this medicine or for 6 months after stopping it. Women should inform their doctor if they wish to become pregnant or think they might be pregnant. Men should not father a child while taking this medicine and for 3 months after stopping it. There is a potential for serious side effects to an unborn child. Talk to your health care professional or pharmacist for more information. Do not breast-feed an infant while taking this medicine or for at least 1 week after stopping it. Men should inform their doctors if  they wish to father a child. This medicine may lower sperm counts. Talk with your doctor or health care professional if you are concerned about your fertility. What side effects may I notice from receiving this medicine? Side effects that you should report to your doctor or health care professional as soon as possible:  allergic reactions like skin rash, itching or hives, swelling of the face, lips, or tongue  breathing problems  pain, redness, or irritation at site where injected  signs and symptoms of a dangerous change in heartbeat or heart rhythm like chest pain; dizziness; fast or irregular heartbeat; palpitations; feeling faint or lightheaded, falls; breathing problems  signs of decreased platelets or bleeding - bruising, pinpoint red spots on  the skin, black, tarry stools, blood in the urine  signs of decreased red blood cells - unusually weak or tired, feeling faint or lightheaded, falls  signs of infection - fever or chills, cough, sore throat, pain or difficulty passing urine  signs and symptoms of kidney injury like trouble passing urine or change in the amount of urine  signs and symptoms of liver injury like dark yellow or brown urine; general ill feeling or flu-like symptoms; light-colored stools; loss of appetite; nausea; right upper belly pain; unusually weak or tired; yellowing of the eyes or skin  swelling of ankles, feet, hands Side effects that usually do not require medical attention (report to your doctor or health care professional if they continue or are bothersome):  constipation  diarrhea  hair loss  loss of appetite  nausea  rash  vomiting This list may not describe all possible side effects. Call your doctor for medical advice about side effects. You may report side effects to FDA at 1-800-FDA-1088. Where should I keep my medicine? This drug is given in a hospital or clinic and will not be stored at home. NOTE: This sheet is a summary. It may not  cover all possible information. If you have questions about this medicine, talk to your doctor, pharmacist, or health care provider.  2021 Elsevier/Gold Standard (2018-01-25 18:06:11)  Romiplostim injection What is this medicine? ROMIPLOSTIM (roe mi PLOE stim) helps your body make more platelets. This medicine is used to treat low platelets caused by chronic idiopathic thrombocytopenic purpura (ITP) or a bone marrow syndrome caused by radiation sickness. This medicine may be used for other purposes; ask your health care provider or pharmacist if you have questions. COMMON BRAND NAME(S): Nplate What should I tell my health care provider before I take this medicine? They need to know if you have any of these conditions:  blood clots  myelodysplastic syndrome  an unusual or allergic reaction to romiplostim, mannitol, other medicines, foods, dyes, or preservatives  pregnant or trying to get pregnant  breast-feeding How should I use this medicine? This medicine is injected under the skin. It is given by a health care provider in a hospital or clinic setting. A special MedGuide will be given to you before each treatment. Be sure to read this information carefully each time. Talk to your health care provider about the use of this medicine in children. While it may be prescribed for children as young as newborns for selected conditions, precautions do apply. Overdosage: If you think you have taken too much of this medicine contact a poison control center or emergency room at once. NOTE: This medicine is only for you. Do not share this medicine with others. What if I miss a dose? Keep appointments for follow-up doses. It is important not to miss your dose. Call your health care provider if you are unable to keep an appointment. What may interact with this medicine? Interactions are not expected. This list may not describe all possible interactions. Give your health care provider a list of all  the medicines, herbs, non-prescription drugs, or dietary supplements you use. Also tell them if you smoke, drink alcohol, or use illegal drugs. Some items may interact with your medicine. What should I watch for while using this medicine? Visit your health care provider for regular checks on your progress. You may need blood work done while you are taking this medicine. Your condition will be monitored carefully while you are receiving this medicine. It is important not  to miss any appointments. What side effects may I notice from receiving this medicine? Side effects that you should report to your doctor or health care professional as soon as possible:  allergic reactions (skin rash, itching or hives; swelling of the face, lips, or tongue)  bleeding (bloody or black, tarry stools; red or dark brown urine; spitting up blood or brown material that looks like coffee grounds; red spots on the skin; unusual bruising or bleeding from the eyes, gums, or nose)  blood clot (chest pain; shortness of breath; pain, swelling, or warmth in the leg)  stroke (changes in vision; confusion; trouble speaking or understanding; severe headaches; sudden numbness or weakness of the face, arm or leg; trouble walking; dizziness; loss of balance or coordination) Side effects that usually do not require medical attention (report to your doctor or health care professional if they continue or are bothersome):  diarrhea  dizziness  headache  joint pain  muscle pain  stomach pain  trouble sleeping This list may not describe all possible side effects. Call your doctor for medical advice about side effects. You may report side effects to FDA at 1-800-FDA-1088. Where should I keep my medicine? This medicine is given in a hospital or clinic. It will not be stored at home. NOTE: This sheet is a summary. It may not cover all possible information. If you have questions about this medicine, talk to your doctor, pharmacist,  or health care provider.  2021 Elsevier/Gold Standard (2019-12-17 10:28:13)

## 2021-04-17 NOTE — Progress Notes (Signed)
[facility] OFFICE PROGRESS NOTE   Diagnosis: Pancreas cancer  INTERVAL HISTORY:   Mr. Nyman returns as scheduled.  He has completed 2 cycles of gemcitabine/Abraxane.  Cycle 3 was held last week due to thrombocytopenia.  He denies nausea/vomiting.  No mouth sores.  No change in baseline diarrhea.  No fever after treatment.  He noted pruritus 1 day after the most recent chemotherapy.  No associated rash.  No bleeding.  He has intermittent pain involving a tooth at the left lower gumline.  He was recently seen by his dentist and reports an extraction as planned.  Objective:  Vital signs in last 24 hours:  Blood pressure 126/67, pulse 76, temperature 97.8 F (36.6 C), temperature source Oral, resp. rate 20, height '5\' 9"'  (1.753 m), weight 133 lb 12.8 oz (60.7 kg), SpO2 100 %.    HEENT: No thrush or ulcers.  Tenderness left lower gumline most posterior tooth. Resp: Lungs clear bilaterally. Cardio: Regular rate and rhythm. GI: No hepatomegaly. Vascular: No leg edema.  Skin: No rash. Port-A-Cath without erythema.   Lab Results:  Lab Results  Component Value Date   WBC 3.4 (L) 04/17/2021   HGB 10.2 (L) 04/17/2021   HCT 31.3 (L) 04/17/2021   MCV 94.6 04/17/2021   PLT 79 (L) 04/17/2021   NEUTROABS 2.6 04/17/2021    Imaging:  No results found.  Medications: I have reviewed the patient's current medications.  Assessment/Plan: 1. Pancreas cancer ? CT urology center 03/04/2020-haziness of the fat adjacent to the celiac axis and SMA with focal narrowing of the SMV, splenic vein, and splenoportal confluence ? CT 07/07/2020-infiltrative retroperitoneal mass with encasement of the celiac axis and SMA, high-grade narrowing of the proximal portal vein with cavernous transformation, nonocclusive thrombus within the main portal vein, mild biliary ductal dilatation, diffuse hypodense/hypoenhancing areas within the liver (edema versus infiltrating neoplasm), 2 x 1.7 cm area  of focal prominence in the pancreas ? EUS 07/10/2020-no pancreas mass identified, 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, 1.5 cm aortocaval node biopsy-scant lymphoid material, no malignancy ? Diagnostic laparoscopy 07/17/2020-segment 3 and 4 liver nodules,adenocarcinoma, positive for pankeratin, CK7 and CK20, partially positive for TTF-1, negative for CDX2 ? Foundation 1-microsatellite stable, tumor mutation burden 4, K-ras G12D, subclonal RB1 alteration ? Elevated CA 19-9 ? Cycle 1 FOLFOX 07/28/2020 ? Cycle 2 FOLFOX 08/18/2020,oxaliplatin dose reduced due to thrombocytopenia ? Cycle 3 FOLFOX 09/08/2020 ? Cycle 4 FOLFOX 09/29/2020 ? Cycle 5 FOLFOX 10/20/2020 ? CTs 11/06/2020-grossly stable infiltrative mass within the pancreatic head and porta hepatis. New small low-density liver lesions. 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 ? Cycle 6 FOLFOX 11/10/2021 ? Cycle 7 FOLFOX 12/01/2020 ? MRI liver 12/04/2020-no significant change in posttreatment appearance of the pancreatic head. Numerous intrinsically low signal hypoenhancing lesions of the liver parenchyma predominantly observed in the right lobe of the liver and liver dome, measuring no greater than 8 mm and as seen on recent prior CT of the abdomen/pelvis. ? Cycle 8 FOLFOX 12/22/2020 ? Cycle 9 FOLFOX 01/12/2021 ? Cycle 10 FOLFOX 02/02/2021 ? MRI liver 02/19/2021-no change in pancreas head mass, multiple hypoenhancing liver lesions are new and increased in size, effacement of portal vein by pancreas head mass with cavernous transformation, no change in mild splenomegaly ? Cycle 1 gemcitabine/Abraxane 03/12/2021 ? Cycle 2 gemcitabine/Abraxane 03/27/2021- dose reductions secondary to neutropenia ? Cycle 3 gemcitabine/Abraxane 04/17/2021-dose reductions  due to thrombocytopenia, white cell growth factor support  added  2. Chronic thrombocytopenia- weekly Nplate beginning 0/10/2240 3. Diabetes 4. Hypertension 5. 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 Neutropenia following gemcitabine/Abraxane chemotherapy   Disposition: Mr. Hoehn appears unchanged.  He has completed 2 cycles of gemcitabine/Abraxane.  Treatment was held last week due to progressive thrombocytopenia.  Platelet count today is better.  Plan to proceed with cycle 3 gemcitabine/Abraxane today as scheduled with further dose reductions.  Plan for white cell growth factor support day 2.  He has received this in the past and is familiar with potential side effects.  Overall plan is to complete 5 cycles of gemcitabine/Abraxane prior to a repeat MRI of the liver.  We reviewed the CBC from today.  Counts are adequate to proceed with treatment.  As noted above the platelet count is better.  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  Plan to continue weekly Nplate.  Per note in the EMR 04/15/2021 he was seen by his dentist on 04/15/2021 and has an infection in his tooth.  Antibiotic is planned with extraction approximately 10 to 14 days afterward.  We will check a CBC the day prior to extraction.  He will return for lab and Nplate next week.  He will return for lab, follow-up, Gemcitabine/Abraxane in 2 weeks.  He will contact the office in the interim with any problems.   [name] ANP/GNP-BC   04/17/2021  11:24 AM

## 2021-04-17 NOTE — Patient Instructions (Addendum)

## 2021-04-17 NOTE — Addendum Note (Signed)
Addended by: Owens Shark on: 04/17/2021 04:51 PM   Modules accepted: Orders

## 2021-04-18 ENCOUNTER — Other Ambulatory Visit: Payer: Self-pay

## 2021-04-18 ENCOUNTER — Inpatient Hospital Stay: Payer: Medicare Other

## 2021-04-18 VITALS — BP 133/66 | HR 86 | Temp 97.9°F | Resp 18

## 2021-04-18 DIAGNOSIS — Z5111 Encounter for antineoplastic chemotherapy: Secondary | ICD-10-CM | POA: Diagnosis not present

## 2021-04-18 MED ORDER — PEGFILGRASTIM-CBQV 6 MG/0.6ML ~~LOC~~ SOSY
6.0000 mg | PREFILLED_SYRINGE | Freq: Once | SUBCUTANEOUS | Status: DC
Start: 2021-04-18 — End: 2021-04-18

## 2021-04-18 MED ORDER — PEGFILGRASTIM-BMEZ 6 MG/0.6ML ~~LOC~~ SOSY
6.0000 mg | PREFILLED_SYRINGE | Freq: Once | SUBCUTANEOUS | Status: AC
  Administered 2021-04-18: 6 mg via SUBCUTANEOUS

## 2021-04-18 NOTE — Patient Instructions (Addendum)
Pegfilgrastim injection What is this medicine? PEGFILGRASTIM (PEG fil gra stim) is a long-acting granulocyte colony-stimulating factor that stimulates the growth of neutrophils, a type of white blood cell important in the body's fight against infection. It is used to reduce the incidence of fever and infection in patients with certain types of cancer who are receiving chemotherapy that affects the bone marrow, and to increase survival after being exposed to high doses of radiation. This medicine may be used for other purposes; ask your health care provider or pharmacist if you have questions. COMMON BRAND NAME(S): Fulphila, Neulasta, Nyvepria, UDENYCA, Ziextenzo What should I tell my health care provider before I take this medicine? They need to know if you have any of these conditions:  kidney disease  latex allergy  ongoing radiation therapy  sickle cell disease  skin reactions to acrylic adhesives (On-Body Injector only)  an unusual or allergic reaction to pegfilgrastim, filgrastim, other medicines, foods, dyes, or preservatives  pregnant or trying to get pregnant  breast-feeding How should I use this medicine? This medicine is for injection under the skin. If you get this medicine at home, you will be taught how to prepare and give the pre-filled syringe or how to use the On-body Injector. Refer to the patient Instructions for Use for detailed instructions. Use exactly as directed. Tell your healthcare provider immediately if you suspect that the On-body Injector may not have performed as intended or if you suspect the use of the On-body Injector resulted in a missed or partial dose. It is important that you put your used needles and syringes in a special sharps container. Do not put them in a trash can. If you do not have a sharps container, call your pharmacist or healthcare provider to get one. Talk to your pediatrician regarding the use of this medicine in children. While this drug  may be prescribed for selected conditions, precautions do apply. Overdosage: If you think you have taken too much of this medicine contact a poison control center or emergency room at once. NOTE: This medicine is only for you. Do not share this medicine with others. What if I miss a dose? It is important not to miss your dose. Call your doctor or health care professional if you miss your dose. If you miss a dose due to an On-body Injector failure or leakage, a new dose should be administered as soon as possible using a single prefilled syringe for manual use. What may interact with this medicine? Interactions have not been studied. This list may not describe all possible interactions. Give your health care provider a list of all the medicines, herbs, non-prescription drugs, or dietary supplements you use. Also tell them if you smoke, drink alcohol, or use illegal drugs. Some items may interact with your medicine. What should I watch for while using this medicine? Your condition will be monitored carefully while you are receiving this medicine. You may need blood work done while you are taking this medicine. Talk to your health care provider about your risk of cancer. You may be more at risk for certain types of cancer if you take this medicine. If you are going to need a MRI, CT scan, or other procedure, tell your doctor that you are using this medicine (On-Body Injector only). What side effects may I notice from receiving this medicine? Side effects that you should report to your doctor or health care professional as soon as possible:  allergic reactions (skin rash, itching or hives, swelling of   the face, lips, or tongue)  back pain  dizziness  fever  pain, redness, or irritation at site where injected  pinpoint red spots on the skin  red or dark-brown urine  shortness of breath or breathing problems  stomach or side pain, or pain at the shoulder  swelling  tiredness  trouble  passing urine or change in the amount of urine  unusual bruising or bleeding Side effects that usually do not require medical attention (report to your doctor or health care professional if they continue or are bothersome):  bone pain  muscle pain This list may not describe all possible side effects. Call your doctor for medical advice about side effects. You may report side effects to FDA at 1-800-FDA-1088. Where should I keep my medicine? Keep out of the reach of children. If you are using this medicine at home, you will be instructed on how to store it. Throw away any unused medicine after the expiration date on the label. NOTE: This sheet is a summary. It may not cover all possible information. If you have questions about this medicine, talk to your doctor, pharmacist, or health care provider.  2021 Elsevier/Gold Standard (2019-11-23 13:20:51)  

## 2021-04-24 ENCOUNTER — Ambulatory Visit: Payer: Self-pay

## 2021-04-24 ENCOUNTER — Inpatient Hospital Stay: Payer: Medicare Other

## 2021-04-24 ENCOUNTER — Other Ambulatory Visit: Payer: Self-pay

## 2021-04-24 ENCOUNTER — Other Ambulatory Visit: Payer: Medicare Other

## 2021-04-24 ENCOUNTER — Ambulatory Visit: Payer: Medicare Other

## 2021-04-24 ENCOUNTER — Ambulatory Visit: Payer: Self-pay | Admitting: Nurse Practitioner

## 2021-04-24 VITALS — BP 128/76 | HR 81 | Temp 98.0°F | Resp 18 | Wt 134.4 lb

## 2021-04-24 DIAGNOSIS — Z5111 Encounter for antineoplastic chemotherapy: Secondary | ICD-10-CM | POA: Diagnosis not present

## 2021-04-24 DIAGNOSIS — C250 Malignant neoplasm of head of pancreas: Secondary | ICD-10-CM

## 2021-04-24 DIAGNOSIS — Z95828 Presence of other vascular implants and grafts: Secondary | ICD-10-CM

## 2021-04-24 LAB — CBC WITH DIFFERENTIAL (CANCER CENTER ONLY)
Abs Immature Granulocytes: 0.75 10*3/uL — ABNORMAL HIGH (ref 0.00–0.07)
Basophils Absolute: 0 10*3/uL (ref 0.0–0.1)
Basophils Relative: 0 %
Eosinophils Absolute: 0.3 10*3/uL (ref 0.0–0.5)
Eosinophils Relative: 2 %
HCT: 30.7 % — ABNORMAL LOW (ref 39.0–52.0)
Hemoglobin: 9.9 g/dL — ABNORMAL LOW (ref 13.0–17.0)
Immature Granulocytes: 5 %
Lymphocytes Relative: 5 %
Lymphs Abs: 0.8 10*3/uL (ref 0.7–4.0)
MCH: 30.7 pg (ref 26.0–34.0)
MCHC: 32.2 g/dL (ref 30.0–36.0)
MCV: 95 fL (ref 80.0–100.0)
Monocytes Absolute: 1 10*3/uL (ref 0.1–1.0)
Monocytes Relative: 6 %
Neutro Abs: 13.5 10*3/uL — ABNORMAL HIGH (ref 1.7–7.7)
Neutrophils Relative %: 82 %
Platelet Count: 92 10*3/uL — ABNORMAL LOW (ref 150–400)
RBC: 3.23 MIL/uL — ABNORMAL LOW (ref 4.22–5.81)
RDW: 13.9 % (ref 11.5–15.5)
WBC Count: 16.2 10*3/uL — ABNORMAL HIGH (ref 4.0–10.5)
nRBC: 0 % (ref 0.0–0.2)

## 2021-04-24 MED ORDER — HEPARIN SOD (PORK) LOCK FLUSH 100 UNIT/ML IV SOLN
500.0000 [IU] | Freq: Once | INTRAVENOUS | Status: AC | PRN
  Administered 2021-04-24: 500 [IU]
  Filled 2021-04-24: qty 5

## 2021-04-24 MED ORDER — SODIUM CHLORIDE 0.9% FLUSH
10.0000 mL | INTRAVENOUS | Status: DC | PRN
  Administered 2021-04-24: 10 mL
  Filled 2021-04-24: qty 10

## 2021-04-24 MED ORDER — ROMIPLOSTIM 125 MCG ~~LOC~~ SOLR
2.0000 ug/kg | Freq: Once | SUBCUTANEOUS | Status: AC
  Administered 2021-04-24: 120 ug via SUBCUTANEOUS
  Filled 2021-04-24: qty 0.24

## 2021-04-24 NOTE — Patient Instructions (Signed)
[facility]  Discharge Instructions: Thank you for choosing [facility] to provide your oncology and hematology care.   If you have a lab appointment with the [facility], please go directly to the [facility] and check in at the registration area.   Wear comfortable clothing and clothing appropriate for easy access to any Portacath or PICC line.   We strive to give you quality time with your provider. You may need to reschedule your appointment if you arrive late (15 or more minutes).  Arriving late affects you and other patients whose appointments are after yours.  Also, if you miss three or more appointments without notifying the office, you may be dismissed from the clinic at the provider's discretion.      For prescription refill requests, have your pharmacy contact our office and allow 72 hours for refills to be completed.    Today you received the following chemotherapy and/or immunotherapy agents Nplate      To help prevent nausea and vomiting after your treatment, we encourage you to take your nausea medication as directed.  BELOW ARE SYMPTOMS THAT SHOULD BE REPORTED IMMEDIATELY: *FEVER GREATER THAN 100.4 F (38 C) OR HIGHER *CHILLS OR SWEATING *NAUSEA AND VOMITING THAT IS NOT CONTROLLED WITH YOUR NAUSEA MEDICATION *UNUSUAL SHORTNESS OF BREATH *UNUSUAL BRUISING OR BLEEDING *URINARY PROBLEMS (pain or burning when urinating, or frequent urination) *BOWEL PROBLEMS (unusual diarrhea, constipation, pain near the anus) TENDERNESS IN MOUTH AND THROAT WITH OR WITHOUT PRESENCE OF ULCERS (sore throat, sores in mouth, or a toothache) UNUSUAL RASH, SWELLING OR PAIN  UNUSUAL VAGINAL DISCHARGE OR ITCHING   Items with * indicate a potential emergency and should be followed up as soon as possible or go to the Emergency Department if any problems should occur.  Please show the CHEMOTHERAPY ALERT CARD or IMMUNOTHERAPY ALERT CARD at check-in to the  Emergency Department and triage nurse.  Should you have questions after your visit or need to cancel or reschedule your appointment, please contact [facility]  Dept: [phone]  and follow the prompts.  Office hours are 8:00 a.m. to 4:30 p.m. Monday - Friday. Please note that voicemails left after 4:00 p.m. may not be returned until the following business day.  We are closed weekends and major holidays. You have access to a nurse at all times for urgent questions. Please call the main number to the clinic Dept: [phone] and follow the prompts.   For any non-urgent questions, you may also contact your provider using MyChart. We now offer e-Visits for anyone 80 and older to request care online for non-urgent symptoms. For details visit [email or web address].   Also download the MyChart app! Go to the app store, search "MyChart", open the app, select [facility], and log in with your MyChart username and password.  Due to Covid, a mask is required upon entering the hospital/clinic. If you do not have a mask, one will be given to you upon arrival. For doctor visits, patients may have 1 support person aged 60 or older with them. For treatment visits, patients cannot have anyone with them due to current Covid guidelines and our immunocompromised population.

## 2021-04-25 ENCOUNTER — Other Ambulatory Visit: Payer: Self-pay | Admitting: Oncology

## 2021-04-25 DIAGNOSIS — C250 Malignant neoplasm of head of pancreas: Secondary | ICD-10-CM

## 2021-04-26 ENCOUNTER — Other Ambulatory Visit: Payer: Self-pay | Admitting: Oncology

## 2021-04-27 ENCOUNTER — Encounter: Payer: Self-pay | Admitting: Oncology

## 2021-04-27 ENCOUNTER — Encounter: Payer: Self-pay | Admitting: Nurse Practitioner

## 2021-05-01 ENCOUNTER — Inpatient Hospital Stay: Payer: Medicare Other

## 2021-05-01 ENCOUNTER — Other Ambulatory Visit: Payer: Self-pay

## 2021-05-01 ENCOUNTER — Inpatient Hospital Stay (HOSPITAL_BASED_OUTPATIENT_CLINIC_OR_DEPARTMENT_OTHER): Payer: Medicare Other | Admitting: Nurse Practitioner

## 2021-05-01 ENCOUNTER — Encounter: Payer: Self-pay | Admitting: Nurse Practitioner

## 2021-05-01 VITALS — BP 137/73 | HR 77 | Temp 98.1°F | Resp 18 | Ht 69.0 in | Wt 133.6 lb

## 2021-05-01 DIAGNOSIS — Z95828 Presence of other vascular implants and grafts: Secondary | ICD-10-CM

## 2021-05-01 DIAGNOSIS — C250 Malignant neoplasm of head of pancreas: Secondary | ICD-10-CM | POA: Diagnosis not present

## 2021-05-01 DIAGNOSIS — Z5111 Encounter for antineoplastic chemotherapy: Secondary | ICD-10-CM | POA: Diagnosis not present

## 2021-05-01 LAB — CMP (CANCER CENTER ONLY)
ALT: 82 U/L — ABNORMAL HIGH (ref 0–44)
AST: 45 U/L — ABNORMAL HIGH (ref 15–41)
Albumin: 4.1 g/dL (ref 3.5–5.0)
Alkaline Phosphatase: 261 U/L — ABNORMAL HIGH (ref 38–126)
Anion gap: 8 (ref 5–15)
BUN: 25 mg/dL — ABNORMAL HIGH (ref 8–23)
CO2: 19 mmol/L — ABNORMAL LOW (ref 22–32)
Calcium: 9 mg/dL (ref 8.9–10.3)
Chloride: 113 mmol/L — ABNORMAL HIGH (ref 98–111)
Creatinine: 1.37 mg/dL — ABNORMAL HIGH (ref 0.61–1.24)
GFR, Estimated: 55 mL/min — ABNORMAL LOW (ref >60)
Glucose, Bld: 179 mg/dL — ABNORMAL HIGH (ref 70–99)
Potassium: 3.8 mmol/L (ref 3.5–5.1)
Sodium: 140 mmol/L (ref 135–145)
Total Bilirubin: 0.7 mg/dL (ref 0.3–1.2)
Total Protein: 6.9 g/dL (ref 6.5–8.1)

## 2021-05-01 LAB — CBC WITH DIFFERENTIAL (CANCER CENTER ONLY)
Abs Immature Granulocytes: 0.08 10*3/uL — ABNORMAL HIGH (ref 0.00–0.07)
Basophils Absolute: 0 10*3/uL (ref 0.0–0.1)
Basophils Relative: 0 %
Eosinophils Absolute: 0.2 10*3/uL (ref 0.0–0.5)
Eosinophils Relative: 2 %
HCT: 29.1 % — ABNORMAL LOW (ref 39.0–52.0)
Hemoglobin: 9.2 g/dL — ABNORMAL LOW (ref 13.0–17.0)
Immature Granulocytes: 1 %
Lymphocytes Relative: 7 %
Lymphs Abs: 0.6 10*3/uL — ABNORMAL LOW (ref 0.7–4.0)
MCH: 30.2 pg (ref 26.0–34.0)
MCHC: 31.6 g/dL (ref 30.0–36.0)
MCV: 95.4 fL (ref 80.0–100.0)
Monocytes Absolute: 0.4 10*3/uL (ref 0.1–1.0)
Monocytes Relative: 4 %
Neutro Abs: 8.1 10*3/uL — ABNORMAL HIGH (ref 1.7–7.7)
Neutrophils Relative %: 86 %
Platelet Count: 83 10*3/uL — ABNORMAL LOW (ref 150–400)
RBC: 3.05 MIL/uL — ABNORMAL LOW (ref 4.22–5.81)
RDW: 14.3 % (ref 11.5–15.5)
WBC Count: 9.3 10*3/uL (ref 4.0–10.5)
nRBC: 0 % (ref 0.0–0.2)

## 2021-05-01 MED ORDER — SODIUM CHLORIDE 0.9% FLUSH
10.0000 mL | INTRAVENOUS | Status: DC | PRN
  Administered 2021-05-01: 10 mL
  Filled 2021-05-01: qty 10

## 2021-05-01 MED ORDER — SODIUM CHLORIDE 0.9 % IV SOLN
500.0000 mg/m2 | Freq: Once | INTRAVENOUS | Status: AC
  Administered 2021-05-01: 874 mg via INTRAVENOUS
  Filled 2021-05-01: qty 22.99

## 2021-05-01 MED ORDER — PROCHLORPERAZINE MALEATE 10 MG PO TABS
10.0000 mg | ORAL_TABLET | Freq: Once | ORAL | Status: AC
Start: 2021-05-01 — End: 2021-05-01
  Administered 2021-05-01: 10 mg via ORAL
  Filled 2021-05-01: qty 1

## 2021-05-01 MED ORDER — MAGNESIUM OXIDE 400 MG PO TABS: 1.0000 | ORAL_TABLET | Freq: Two times a day (BID) | ORAL | 0 refills | Status: DC

## 2021-05-01 MED ORDER — ROMIPLOSTIM 125 MCG ~~LOC~~ SOLR
2.0000 ug/kg | Freq: Once | SUBCUTANEOUS | Status: AC
  Administered 2021-05-01: 120 ug via SUBCUTANEOUS
  Filled 2021-05-01: qty 0.24

## 2021-05-01 MED ORDER — FAMOTIDINE 20 MG IN NS 100 ML IVPB
20.0000 mg | Freq: Once | INTRAVENOUS | Status: AC
  Administered 2021-05-01: 20 mg via INTRAVENOUS
  Filled 2021-05-01: qty 100

## 2021-05-01 MED ORDER — SODIUM CHLORIDE 0.9 % IV SOLN
Freq: Once | INTRAVENOUS | Status: AC
  Filled 2021-05-01: qty 250

## 2021-05-01 MED ORDER — PACLITAXEL PROTEIN-BOUND CHEMO INJECTION 100 MG
45.0000 mg/m2 | Freq: Once | INTRAVENOUS | Status: AC
Start: 2021-05-01 — End: 2021-05-01
  Administered 2021-05-01: 75 mg via INTRAVENOUS
  Filled 2021-05-01: qty 15

## 2021-05-01 MED ORDER — HEPARIN SOD (PORK) LOCK FLUSH 100 UNIT/ML IV SOLN
500.0000 [IU] | Freq: Once | INTRAVENOUS | Status: AC | PRN
  Administered 2021-05-01: 500 [IU]
  Filled 2021-05-01: qty 5

## 2021-05-01 NOTE — Patient Instructions (Signed)
[facility]  Discharge Instructions: Thank you for choosing [facility] to provide your oncology and hematology care.   If you have a lab appointment with the [facility], please go directly to the [facility] and check in at the registration area.   Wear comfortable clothing and clothing appropriate for easy access to any Portacath or PICC line.   We strive to give you quality time with your provider. You may need to reschedule your appointment if you arrive late (15 or more minutes).  Arriving late affects you and other patients whose appointments are after yours.  Also, if you miss three or more appointments without notifying the office, you may be dismissed from the clinic at the provider's discretion.      For prescription refill requests, have your pharmacy contact our office and allow 72 hours for refills to be completed.    Today you received the following chemotherapy and/or immunotherapy agents paclitaxel-protein bound, gemcitabine, n-Plate   To help prevent nausea and vomiting after your treatment, we encourage you to take your nausea medication as directed.  BELOW ARE SYMPTOMS THAT SHOULD BE REPORTED IMMEDIATELY: *FEVER GREATER THAN 100.4 F (38 C) OR HIGHER *CHILLS OR SWEATING *NAUSEA AND VOMITING THAT IS NOT CONTROLLED WITH YOUR NAUSEA MEDICATION *UNUSUAL SHORTNESS OF BREATH *UNUSUAL BRUISING OR BLEEDING *URINARY PROBLEMS (pain or burning when urinating, or frequent urination) *BOWEL PROBLEMS (unusual diarrhea, constipation, pain near the anus) TENDERNESS IN MOUTH AND THROAT WITH OR WITHOUT PRESENCE OF ULCERS (sore throat, sores in mouth, or a toothache) UNUSUAL RASH, SWELLING OR PAIN  UNUSUAL VAGINAL DISCHARGE OR ITCHING   Items with * indicate a potential emergency and should be followed up as soon as possible or go to the Emergency Department if any problems should occur.  Please show the CHEMOTHERAPY ALERT CARD or  IMMUNOTHERAPY ALERT CARD at check-in to the Emergency Department and triage nurse.  Should you have questions after your visit or need to cancel or reschedule your appointment, please contact [facility]  Dept: [phone]  and follow the prompts.  Office hours are 8:00 a.m. to 4:30 p.m. Monday - Friday. Please note that voicemails left after 4:00 p.m. may not be returned until the following business day.  We are closed weekends and major holidays. You have access to a nurse at all times for urgent questions. Please call the main number to the clinic Dept: [phone] and follow the prompts.   For any non-urgent questions, you may also contact your provider using MyChart. We now offer e-Visits for anyone 17 and older to request care online for non-urgent symptoms. For details visit [email or web address].   Also download the MyChart app! Go to the app store, search "MyChart", open the app, select [facility], and log in with your MyChart username and password.  Due to Covid, a mask is required upon entering the hospital/clinic. If you do not have a mask, one will be given to you upon arrival. For doctor visits, patients may have 1 support person aged 86 or older with them. For treatment visits, patients cannot have anyone with them due to current Covid guidelines and our immunocompromised population.   Nanoparticle Albumin-Bound Paclitaxel injection What is this medication? NANOPARTICLE ALBUMIN-BOUND PACLITAXEL (Na no PAHR ti kuhl al BYOO muhn-bound PAK li TAX el) is a chemotherapy drug. It targets fast dividing cells, like cancer cells, and causes these cells to die. This medicine is used to treatadvanced breast cancer,  lung cancer, and pancreatic cancer. This medicine may be used for other purposes; ask your health care provider orpharmacist if you have questions. COMMON BRAND NAME(S): Abraxane What should I tell my care team before I take this medication? They need  to know if you have any of these conditions: kidney disease liver disease low blood counts, like low white cell, platelet, or red cell counts lung or breathing disease, like asthma tingling of the fingers or toes, or other nerve disorder an unusual or allergic reaction to paclitaxel, albumin, other chemotherapy, other medicines, foods, dyes, or preservatives pregnant or trying to get pregnant breast-feeding How should I use this medication? This drug is given as an infusion into a vein. It is administered in a hospitalor clinic by a specially trained health care professional. Talk to your pediatrician regarding the use of this medicine in children.Special care may be needed. Overdosage: If you think you have taken too much of this medicine contact apoison control center or emergency room at once. NOTE: This medicine is only for you. Do not share this medicine with others. What if I miss a dose? It is important not to miss your dose. Call your doctor or health careprofessional if you are unable to keep an appointment. What may interact with this medication? This medicine may interact with the following medications: antiviral medicines for hepatitis, HIV or AIDS certain antibiotics like erythromycin and clarithromycin certain medicines for fungal infections like ketoconazole and itraconazole certain medicines for seizures like carbamazepine, phenobarbital, phenytoin gemfibrozil nefazodone rifampin St. John's wort This list may not describe all possible interactions. Give your health care provider a list of all the medicines, herbs, non-prescription drugs, or dietary supplements you use. Also tell them if you smoke, drink alcohol, or use illegaldrugs. Some items may interact with your medicine. What should I watch for while using this medication? Your condition will be monitored carefully while you are receiving this medicine. You will need important blood work done while you are taking  thismedicine. This medicine can cause serious allergic reactions. If you experience allergic reactions like skin rash, itching or hives, swelling of the face, lips, ortongue, tell your doctor or health care professional right away. In some cases, you may be given additional medicines to help with side effects.Follow all directions for their use. This drug may make you feel generally unwell. This is not uncommon, as chemotherapy can affect healthy cells as well as cancer cells. Report any side effects. Continue your course of treatment even though you feel ill unless yourdoctor tells you to stop. Call your doctor or health care professional for advice if you get a fever, chills or sore throat, or other symptoms of a cold or flu. Do not treat yourself. This drug decreases your body's ability to fight infections. Try toavoid being around people who are sick. This medicine may increase your risk to bruise or bleed. Call your doctor orhealth care professional if you notice any unusual bleeding. Be careful brushing and flossing your teeth or using a toothpick because you may get an infection or bleed more easily. If you have any dental work done,tell your dentist you are receiving this medicine. Avoid taking products that contain aspirin, acetaminophen, ibuprofen, naproxen, or ketoprofen unless instructed by your doctor. These medicines may hide afever. Do not become pregnant while taking this medicine or for 6 months after stopping it. Women should inform their doctor if they wish to become pregnant or think they might be pregnant. Men should not father  a child while taking this medicine or for 3 months after stopping it. There is a potential for serious side effects to an unborn child. Talk to your health care professionalor pharmacist for more information. Do not breast-feed an infant while taking this medicine or for 2 weeks afterstopping it. This medicine may interfere with the ability to get pregnant or to  father a child. You should talk to your doctor or health care professional if you areconcerned about your fertility. What side effects may I notice from receiving this medication? Side effects that you should report to your doctor or health care professionalas soon as possible: allergic reactions like skin rash, itching or hives, swelling of the face, lips, or tongue breathing problems changes in vision fast, irregular heartbeat low blood pressure mouth sores pain, tingling, numbness in the hands or feet signs of decreased platelets or bleeding - bruising, pinpoint red spots on the skin, black, tarry stools, blood in the urine signs of decreased red blood cells - unusually weak or tired, feeling faint or lightheaded, falls signs of infection - fever or chills, cough, sore throat, pain or difficulty passing urine signs and symptoms of liver injury like dark yellow or brown urine; general ill feeling or flu-like symptoms; light-colored stools; loss of appetite; nausea; right upper belly pain; unusually weak or tired; yellowing of the eyes or skin swelling of the ankles, feet, hands unusually slow heartbeat Side effects that usually do not require medical attention (report to yourdoctor or health care professional if they continue or are bothersome): diarrhea hair loss loss of appetite nausea, vomiting tiredness This list may not describe all possible side effects. Call your doctor for medical advice about side effects. You may report side effects to FDA at1-800-FDA-1088. Where should I keep my medication? This drug is given in a hospital or clinic and will not be stored at home. NOTE: This sheet is a summary. It may not cover all possible information. If you have questions about this medicine, talk to your doctor, pharmacist, orhealth care provider.  2022 Elsevier/Gold Standard (2017-07-05 13:03:45)  Gemcitabine injection What is this medication? GEMCITABINE (jem SYE ta been) is a  chemotherapy drug. This medicine is used to treat many types of cancer like breast cancer, lung cancer, pancreatic cancer,and ovarian cancer. This medicine may be used for other purposes; ask your health care provider orpharmacist if you have questions. COMMON BRAND NAME(S): Gemzar, Infugem What should I tell my care team before I take this medication? They need to know if you have any of these conditions: blood disorders infection kidney disease liver disease lung or breathing disease, like asthma recent or ongoing radiation therapy an unusual or allergic reaction to gemcitabine, other chemotherapy, other medicines, foods, dyes, or preservatives pregnant or trying to get pregnant breast-feeding How should I use this medication? This drug is given as an infusion into a vein. It is administered in a hospitalor clinic by a specially trained health care professional. Talk to your pediatrician regarding the use of this medicine in children.Special care may be needed. Overdosage: If you think you have taken too much of this medicine contact apoison control center or emergency room at once. NOTE: This medicine is only for you. Do not share this medicine with others. What if I miss a dose? It is important not to miss your dose. Call your doctor or health careprofessional if you are unable to keep an appointment. What may interact with this medication? medicines to increase blood counts like filgrastim,  pegfilgrastim, sargramostim some other chemotherapy drugs like cisplatin vaccines Talk to your doctor or health care professional before taking any of thesemedicines: acetaminophen aspirin ibuprofen ketoprofen naproxen This list may not describe all possible interactions. Give your health care provider a list of all the medicines, herbs, non-prescription drugs, or dietary supplements you use. Also tell them if you smoke, drink alcohol, or use illegaldrugs. Some items may interact with your  medicine. What should I watch for while using this medication? Visit your doctor for checks on your progress. This drug may make you feel generally unwell. This is not uncommon, as chemotherapy can affect healthy cells as well as cancer cells. Report any side effects. Continue your course oftreatment even though you feel ill unless your doctor tells you to stop. In some cases, you may be given additional medicines to help with side effects.Follow all directions for their use. Call your doctor or health care professional for advice if you get a fever, chills or sore throat, or other symptoms of a cold or flu. Do not treat yourself. This drug decreases your body's ability to fight infections. Try toavoid being around people who are sick. This medicine may increase your risk to bruise or bleed. Call your doctor orhealth care professional if you notice any unusual bleeding. Be careful brushing and flossing your teeth or using a toothpick because you may get an infection or bleed more easily. If you have any dental work done,tell your dentist you are receiving this medicine. Avoid taking products that contain aspirin, acetaminophen, ibuprofen, naproxen, or ketoprofen unless instructed by your doctor. These medicines may hide afever. Do not become pregnant while taking this medicine or for 6 months after stopping it. Women should inform their doctor if they wish to become pregnant or think they might be pregnant. Men should not father a child while taking this medicine and for 3 months after stopping it. There is a potential for serious side effects to an unborn child. Talk to your health care professional or pharmacist for more information. Do not breast-feed an infant while takingthis medicine or for at least 1 week after stopping it. Men should inform their doctors if they wish to father a child. This medicine may lower sperm counts. Talk with your doctor or health care professional ifyou are concerned about  your fertility. What side effects may I notice from receiving this medication? Side effects that you should report to your doctor or health care professionalas soon as possible: allergic reactions like skin rash, itching or hives, swelling of the face, lips, or tongue breathing problems pain, redness, or irritation at site where injected signs and symptoms of a dangerous change in heartbeat or heart rhythm like chest pain; dizziness; fast or irregular heartbeat; palpitations; feeling faint or lightheaded, falls; breathing problems signs of decreased platelets or bleeding - bruising, pinpoint red spots on the skin, black, tarry stools, blood in the urine signs of decreased red blood cells - unusually weak or tired, feeling faint or lightheaded, falls signs of infection - fever or chills, cough, sore throat, pain or difficulty passing urine signs and symptoms of kidney injury like trouble passing urine or change in the amount of urine signs and symptoms of liver injury like dark yellow or brown urine; general ill feeling or flu-like symptoms; light-colored stools; loss of appetite; nausea; right upper belly pain; unusually weak or tired; yellowing of the eyes or skin swelling of ankles, feet, hands Side effects that usually do not require medical attention (report  to yourdoctor or health care professional if they continue or are bothersome): constipation diarrhea hair loss loss of appetite nausea rash vomiting This list may not describe all possible side effects. Call your doctor for medical advice about side effects. You may report side effects to FDA at1-800-FDA-1088. Where should I keep my medication? This drug is given in a hospital or clinic and will not be stored at home. NOTE: This sheet is a summary. It may not cover all possible information. If you have questions about this medicine, talk to your doctor, pharmacist, orhealth care provider.  2022 Elsevier/Gold Standard (2018-01-25  18:06:11)  Romiplostim injection What is this medication? ROMIPLOSTIM (roe mi PLOE stim) helps your body make more platelets. This medicine is used to treat low platelets caused by chronic idiopathic thrombocytopenic purpura (ITP) or a bone marrow syndrome caused by radiationsickness. This medicine may be used for other purposes; ask your health care provider orpharmacist if you have questions. COMMON BRAND NAME(S): Nplate What should I tell my care team before I take this medication? They need to know if you have any of these conditions: blood clots myelodysplastic syndrome an unusual or allergic reaction to romiplostim, mannitol, other medicines, foods, dyes, or preservatives pregnant or trying to get pregnant breast-feeding How should I use this medication? This medicine is injected under the skin. It is given by a health care providerin a hospital or clinic setting. A special MedGuide will be given to you before each treatment. Be sure to readthis information carefully each time. Talk to your health care provider about the use of this medicine in children. While it may be prescribed for children as young as newborns for selectedconditions, precautions do apply. Overdosage: If you think you have taken too much of this medicine contact apoison control center or emergency room at once. NOTE: This medicine is only for you. Do not share this medicine with others. What if I miss a dose? Keep appointments for follow-up doses. It is important not to miss your dose.Call your health care provider if you are unable to keep an appointment. What may interact with this medication? Interactions are not expected. This list may not describe all possible interactions. Give your health care provider a list of all the medicines, herbs, non-prescription drugs, or dietary supplements you use. Also tell them if you smoke, drink alcohol, or use illegaldrugs. Some items may interact with your medicine. What  should I watch for while using this medication? Visit your health care provider for regular checks on your progress. You may need blood work done while you are taking this medicine. Your condition will be monitored carefully while you are receiving this medicine. It is important notto miss any appointments. What side effects may I notice from receiving this medication? Side effects that you should report to your doctor or health care professionalas soon as possible: allergic reactions (skin rash, itching or hives; swelling of the face, lips, or tongue) bleeding (bloody or black, tarry stools; red or dark brown urine; spitting up blood or brown material that looks like coffee grounds; red spots on the skin; unusual bruising or bleeding from the eyes, gums, or nose) blood clot (chest pain; shortness of breath; pain, swelling, or warmth in the leg) stroke (changes in vision; confusion; trouble speaking or understanding; severe headaches; sudden numbness or weakness of the face, arm or leg; trouble walking; dizziness; loss of balance or coordination) Side effects that usually do not require medical attention (report to yourdoctor or health care professional  if they continue or are bothersome): diarrhea dizziness headache joint pain muscle pain stomach pain trouble sleeping This list may not describe all possible side effects. Call your doctor for medical advice about side effects. You may report side effects to FDA at1-800-FDA-1088. Where should I keep my medication? This medicine is given in a hospital or clinic. It will not be stored at home. NOTE: This sheet is a summary. It may not cover all possible information. If you have questions about this medicine, talk to your doctor, pharmacist, orhealth care provider.  2022 Elsevier/Gold Standard (2019-12-17 10:28:13)

## 2021-05-01 NOTE — Progress Notes (Signed)
Per Leander Rams, NP ok to treat with Plts 83 and Alt 82

## 2021-05-01 NOTE — Progress Notes (Signed)
[facility] OFFICE PROGRESS NOTE   Diagnosis: Pancreas cancer  INTERVAL HISTORY:   Aaron Johnston returns as scheduled.  He completed cycle 3 gemcitabine/Abraxane 04/17/2021.  He denies nausea/vomiting.  No mouth sores.  No change in baseline bowel habits.  Beginning day 3 he notes a fine "heat rash" in multiple areas.  He takes Benadryl with some relief.  The rash tends to last about 2 days.  He denies fever.  He denies pain.  Objective:  Vital signs in last 24 hours:  Blood pressure 137/73, pulse 77, temperature 98.1 F (36.7 C), temperature source Oral, resp. rate 18, height '5\' 9"'  (1.753 m), weight 133 lb 9.6 oz (60.6 kg), SpO2 100 %.    HEENT: No thrush or ulcers. Resp: Lungs clear bilaterally. Cardio: Regular rate and rhythm. GI: Abdomen soft and nontender.  No hepatomegaly.  No mass. Vascular: No leg edema. Skin: No rash. Port-A-Cath without erythema.   Lab Results:  Lab Results  Component Value Date   WBC 9.3 05/01/2021   HGB 9.2 (L) 05/01/2021   HCT 29.1 (L) 05/01/2021   MCV 95.4 05/01/2021   PLT 83 (L) 05/01/2021   NEUTROABS 8.1 (H) 05/01/2021    Imaging:  No results found.  Medications: I have reviewed the patient's current medications.  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, mild biliary ductal dilatation, diffuse hypodense/hypoenhancing areas within the liver (edema versus infiltrating neoplasm), 2 x 1.7 cm area of focal prominence in the pancreas EUS 07/10/2020-no pancreas mass identified, 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, 1.5 cm aortocaval node biopsy-scant  lymphoid material, no malignancy Diagnostic laparoscopy 07/17/2020-segment 3 and 4 liver nodules, adenocarcinoma, positive for pankeratin, CK7 and CK20, partially positive for TTF-1, negative for CDX2 Foundation 1-microsatellite stable, tumor mutation burden 4, K-ras G12D, subclonal RB1 alteration Elevated CA 19-9 Cycle 1 FOLFOX 07/28/2020 Cycle 2 FOLFOX 08/18/2020, oxaliplatin dose reduced due to thrombocytopenia Cycle 3 FOLFOX 09/08/2020 Cycle 4 FOLFOX 09/29/2020  Cycle 5 FOLFOX 10/20/2020 CTs 11/06/2020-grossly stable infiltrative mass within the pancreatic head and porta hepatis.  New small low-density liver lesions.  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 Cycle 6 FOLFOX 11/10/2021  Cycle 7 FOLFOX 12/01/2020 MRI liver 12/04/2020-no significant change in posttreatment appearance of the pancreatic head.  Numerous intrinsically low signal hypoenhancing lesions of the liver parenchyma predominantly observed in the right lobe of the liver and liver dome, measuring no greater than 8 mm and as seen on recent prior CT of the abdomen/pelvis. Cycle 8 FOLFOX 12/22/2020 Cycle 9 FOLFOX 01/12/2021 Cycle 10 FOLFOX 02/02/2021 MRI liver 02/19/2021-no change in pancreas head mass, multiple hypoenhancing liver lesions are new and increased in size, effacement of portal vein by pancreas head mass with cavernous transformation, no change in mild splenomegaly Cycle 1 gemcitabine/Abraxane 03/12/2021 Cycle 2 gemcitabine/Abraxane 03/27/2021- dose reductions secondary to neutropenia Cycle 3 gemcitabine/Abraxane 04/17/2021-dose reductions due to thrombocytopenia, white cell growth factor support added Cycle 4 gemcitabine/Abraxane 05/01/2021, Ziextenzo   Chronic thrombocytopenia- weekly Nplate beginning 5/53/7482 Diabetes Hypertension 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 appears unchanged.  He has completed 3 cycles of gemcitabine/Abraxane.  Plan to proceed with cycle 4 today as scheduled, overall plan is to complete 5 cycles prior to restaging CTs.  We reviewed the CBC from today.  Counts adequate to proceed with treatment.  Persistent moderate thrombocytopenia.  Continue weekly Nplate.  White cell growth factor support day 2.  For the rash/pruritus after treatment he will continue Benadryl as needed.  We are adding Pepcid to the premedication regimen.  He will continue a daily Pepcid for 3 days.  He will return for lab, follow-up, cycle 5 gemcitabine/Abraxane in 2 weeks.  He will contact the office in the interim with any problems.  Plan reviewed with Dr. Benay Spice.    [name] ANP/GNP-BC   05/01/2021  8:49 AM

## 2021-05-02 ENCOUNTER — Inpatient Hospital Stay: Payer: Medicare Other

## 2021-05-02 VITALS — BP 138/80 | HR 79 | Temp 97.7°F | Resp 19

## 2021-05-02 DIAGNOSIS — Z5111 Encounter for antineoplastic chemotherapy: Secondary | ICD-10-CM | POA: Diagnosis not present

## 2021-05-02 DIAGNOSIS — C250 Malignant neoplasm of head of pancreas: Secondary | ICD-10-CM

## 2021-05-02 MED ORDER — PEGFILGRASTIM-CBQV 6 MG/0.6ML ~~LOC~~ SOSY
6.0000 mg | PREFILLED_SYRINGE | Freq: Once | SUBCUTANEOUS | Status: DC
  Administered 2021-05-02: 6 mg via SUBCUTANEOUS

## 2021-05-04 ENCOUNTER — Encounter: Payer: Self-pay | Admitting: Nurse Practitioner

## 2021-05-04 LAB — CANCER ANTIGEN 19-9: CA 19-9: 3795 U/mL — ABNORMAL HIGH (ref 0–35)

## 2021-05-04 NOTE — Addendum Note (Signed)
Addended by: Roselind Messier A on: 05/04/2021 08:02 AM   Modules accepted: Orders

## 2021-05-06 ENCOUNTER — Other Ambulatory Visit: Payer: Self-pay

## 2021-05-06 ENCOUNTER — Inpatient Hospital Stay: Payer: Medicare Other

## 2021-05-06 DIAGNOSIS — C250 Malignant neoplasm of head of pancreas: Secondary | ICD-10-CM

## 2021-05-06 DIAGNOSIS — Z95828 Presence of other vascular implants and grafts: Secondary | ICD-10-CM

## 2021-05-06 DIAGNOSIS — Z5111 Encounter for antineoplastic chemotherapy: Secondary | ICD-10-CM | POA: Diagnosis not present

## 2021-05-06 LAB — CBC WITH DIFFERENTIAL (CANCER CENTER ONLY)
Abs Immature Granulocytes: 0.06 10*3/uL (ref 0.00–0.07)
Basophils Absolute: 0.1 10*3/uL (ref 0.0–0.1)
Basophils Relative: 1 %
Eosinophils Absolute: 0.3 10*3/uL (ref 0.0–0.5)
Eosinophils Relative: 2 %
HCT: 28 % — ABNORMAL LOW (ref 39.0–52.0)
Hemoglobin: 9 g/dL — ABNORMAL LOW (ref 13.0–17.0)
Immature Granulocytes: 0 %
Lymphocytes Relative: 3 %
Lymphs Abs: 0.5 10*3/uL — ABNORMAL LOW (ref 0.7–4.0)
MCH: 31 pg (ref 26.0–34.0)
MCHC: 32.1 g/dL (ref 30.0–36.0)
MCV: 96.6 fL (ref 80.0–100.0)
Monocytes Absolute: 0.3 10*3/uL (ref 0.1–1.0)
Monocytes Relative: 2 %
Neutro Abs: 14.4 10*3/uL — ABNORMAL HIGH (ref 1.7–7.7)
Neutrophils Relative %: 92 %
Platelet Count: 123 10*3/uL — ABNORMAL LOW (ref 150–400)
RBC: 2.9 MIL/uL — ABNORMAL LOW (ref 4.22–5.81)
RDW: 14.7 % (ref 11.5–15.5)
WBC Count: 15.6 10*3/uL — ABNORMAL HIGH (ref 4.0–10.5)
nRBC: 0 % (ref 0.0–0.2)

## 2021-05-06 MED ORDER — HEPARIN SOD (PORK) LOCK FLUSH 100 UNIT/ML IV SOLN
500.0000 [IU] | Freq: Once | INTRAVENOUS | Status: AC | PRN
  Administered 2021-05-06: 500 [IU]
  Filled 2021-05-06: qty 5

## 2021-05-06 MED ORDER — ROMIPLOSTIM 125 MCG ~~LOC~~ SOLR
2.0000 ug/kg | Freq: Once | SUBCUTANEOUS | Status: AC
Start: 2021-05-06 — End: 2021-05-06
  Administered 2021-05-06: 120 ug via SUBCUTANEOUS
  Filled 2021-05-06: qty 0.24

## 2021-05-06 MED ORDER — SODIUM CHLORIDE 0.9% FLUSH
10.0000 mL | INTRAVENOUS | Status: DC | PRN
Start: 2021-05-06 — End: 2021-05-06
  Administered 2021-05-06: 10 mL
  Filled 2021-05-06: qty 10

## 2021-05-06 NOTE — Patient Instructions (Addendum)
[facility]  Discharge Instructions: Thank you for choosing [facility] to provide your oncology and hematology care.   If you have a lab appointment with the [facility], please go directly to the [facility] and check in at the registration area.   Wear comfortable clothing and clothing appropriate for easy access to any Portacath or PICC line.   We strive to give you quality time with your provider. You may need to reschedule your appointment if you arrive late (15 or more minutes).  Arriving late affects you and other patients whose appointments are after yours.  Also, if you miss three or more appointments without notifying the office, you may be dismissed from the clinic at the provider's discretion.      For prescription refill requests, have your pharmacy contact our office and allow 72 hours for refills to be completed.    Today you received the following chemotherapy and/or immunotherapy agents N-plate      To help prevent nausea and vomiting after your treatment, we encourage you to take your nausea medication as directed.  BELOW ARE SYMPTOMS THAT SHOULD BE REPORTED IMMEDIATELY: *FEVER GREATER THAN 100.4 F (38 C) OR HIGHER *CHILLS OR SWEATING *NAUSEA AND VOMITING THAT IS NOT CONTROLLED WITH YOUR NAUSEA MEDICATION *UNUSUAL SHORTNESS OF BREATH *UNUSUAL BRUISING OR BLEEDING *URINARY PROBLEMS (pain or burning when urinating, or frequent urination) *BOWEL PROBLEMS (unusual diarrhea, constipation, pain near the anus) TENDERNESS IN MOUTH AND THROAT WITH OR WITHOUT PRESENCE OF ULCERS (sore throat, sores in mouth, or a toothache) UNUSUAL RASH, SWELLING OR PAIN  UNUSUAL VAGINAL DISCHARGE OR ITCHING   Items with * indicate a potential emergency and should be followed up as soon as possible or go to the Emergency Department if any problems should occur.  Please show the CHEMOTHERAPY ALERT CARD or IMMUNOTHERAPY ALERT CARD at check-in to the  Emergency Department and triage nurse.  Should you have questions after your visit or need to cancel or reschedule your appointment, please contact [facility]  Dept: [phone]  and follow the prompts.  Office hours are 8:00 a.m. to 4:30 p.m. Monday - Friday. Please note that voicemails left after 4:00 p.m. may not be returned until the following business day.  We are closed weekends and major holidays. You have access to a nurse at all times for urgent questions. Please call the main number to the clinic Dept: [phone] and follow the prompts.   For any non-urgent questions, you may also contact your provider using MyChart. We now offer e-Visits for anyone 64 and older to request care online for non-urgent symptoms. For details visit [email or web address].   Also download the MyChart app! Go to the app store, search "MyChart", open the app, select [facility], and log in with your MyChart username and password.  Due to Covid, a mask is required upon entering the hospital/clinic. If you do not have a mask, one will be given to you upon arrival. For doctor visits, patients may have 1 support person aged 47 or older with them. For treatment visits, patients cannot have anyone with them due to current Covid guidelines and our immunocompromised population.    Romiplostim injection What is this medication? ROMIPLOSTIM (roe mi PLOE stim) helps your body make more platelets. This medicine is used to treat low platelets caused by chronic idiopathic thrombocytopenic purpura (ITP) or a bone marrow syndrome caused by radiationsickness. This medicine may be used for other purposes;  ask your health care provider orpharmacist if you have questions. COMMON BRAND NAME(S): Nplate What should I tell my care team before I take this medication? They need to know if you have any of these conditions: blood clots myelodysplastic syndrome an unusual or allergic reaction to  romiplostim, mannitol, other medicines, foods, dyes, or preservatives pregnant or trying to get pregnant breast-feeding How should I use this medication? This medicine is injected under the skin. It is given by a health care providerin a hospital or clinic setting. A special MedGuide will be given to you before each treatment. Be sure to readthis information carefully each time. Talk to your health care provider about the use of this medicine in children. While it may be prescribed for children as young as newborns for selectedconditions, precautions do apply. Overdosage: If you think you have taken too much of this medicine contact apoison control center or emergency room at once. NOTE: This medicine is only for you. Do not share this medicine with others. What if I miss a dose? Keep appointments for follow-up doses. It is important not to miss your dose.Call your health care provider if you are unable to keep an appointment. What may interact with this medication? Interactions are not expected. This list may not describe all possible interactions. Give your health care provider a list of all the medicines, herbs, non-prescription drugs, or dietary supplements you use. Also tell them if you smoke, drink alcohol, or use illegaldrugs. Some items may interact with your medicine. What should I watch for while using this medication? Visit your health care provider for regular checks on your progress. You may need blood work done while you are taking this medicine. Your condition will be monitored carefully while you are receiving this medicine. It is important notto miss any appointments. What side effects may I notice from receiving this medication? Side effects that you should report to your doctor or health care professionalas soon as possible: allergic reactions (skin rash, itching or hives; swelling of the face, lips, or tongue) bleeding (bloody or black, tarry stools; red or dark brown urine;  spitting up blood or brown material that looks like coffee grounds; red spots on the skin; unusual bruising or bleeding from the eyes, gums, or nose) blood clot (chest pain; shortness of breath; pain, swelling, or warmth in the leg) stroke (changes in vision; confusion; trouble speaking or understanding; severe headaches; sudden numbness or weakness of the face, arm or leg; trouble walking; dizziness; loss of balance or coordination) Side effects that usually do not require medical attention (report to yourdoctor or health care professional if they continue or are bothersome): diarrhea dizziness headache joint pain muscle pain stomach pain trouble sleeping This list may not describe all possible side effects. Call your doctor for medical advice about side effects. You may report side effects to FDA at1-800-FDA-1088. Where should I keep my medication? This medicine is given in a hospital or clinic. It will not be stored at home. NOTE: This sheet is a summary. It may not cover all possible information. If you have questions about this medicine, talk to your doctor, pharmacist, orhealth care provider.  2022 Elsevier/Gold Standard (2019-12-17 10:28:13)

## 2021-05-08 ENCOUNTER — Other Ambulatory Visit: Payer: Self-pay

## 2021-05-08 ENCOUNTER — Ambulatory Visit: Payer: Medicare Other

## 2021-05-08 ENCOUNTER — Other Ambulatory Visit: Payer: Medicare Other

## 2021-05-10 ENCOUNTER — Other Ambulatory Visit: Payer: Self-pay | Admitting: Oncology

## 2021-05-11 ENCOUNTER — Telehealth: Payer: Self-pay | Admitting: *Deleted

## 2021-05-11 NOTE — Telephone Encounter (Signed)
Call from dental practice that patient has a broken tooth that needs to be attended to as well as a cleaning. Requesting Dr. Benay Spice call in antibiotic to take and they will take over future antibiotic needs for dental procedures.  Requests to be informed when/what was called in and they will schedule the appointment so office will know date he needs CBC collected. Fax [phone] Ph # [phone]

## 2021-05-11 NOTE — Telephone Encounter (Signed)
Per Dr. Benay Spice: Schedule the appointment and will check CBC day prior and order antibiotic if indicated.  Faxed this message back to dental office.

## 2021-05-15 ENCOUNTER — Inpatient Hospital Stay: Payer: Medicare Other | Attending: Genetic Counselor

## 2021-05-15 ENCOUNTER — Inpatient Hospital Stay (HOSPITAL_BASED_OUTPATIENT_CLINIC_OR_DEPARTMENT_OTHER): Payer: Medicare Other | Admitting: Oncology

## 2021-05-15 ENCOUNTER — Inpatient Hospital Stay: Payer: Medicare Other

## 2021-05-15 ENCOUNTER — Other Ambulatory Visit: Payer: Self-pay

## 2021-05-15 VITALS — BP 109/66 | HR 73 | Temp 98.1°F | Resp 20 | Ht 69.0 in | Wt 136.6 lb

## 2021-05-15 DIAGNOSIS — Z5189 Encounter for other specified aftercare: Secondary | ICD-10-CM | POA: Diagnosis not present

## 2021-05-15 DIAGNOSIS — I10 Essential (primary) hypertension: Secondary | ICD-10-CM | POA: Diagnosis not present

## 2021-05-15 DIAGNOSIS — C250 Malignant neoplasm of head of pancreas: Secondary | ICD-10-CM

## 2021-05-15 DIAGNOSIS — T451X5A Adverse effect of antineoplastic and immunosuppressive drugs, initial encounter: Secondary | ICD-10-CM | POA: Diagnosis not present

## 2021-05-15 DIAGNOSIS — D696 Thrombocytopenia, unspecified: Secondary | ICD-10-CM | POA: Insufficient documentation

## 2021-05-15 DIAGNOSIS — C787 Secondary malignant neoplasm of liver and intrahepatic bile duct: Secondary | ICD-10-CM | POA: Diagnosis present

## 2021-05-15 DIAGNOSIS — Z95828 Presence of other vascular implants and grafts: Secondary | ICD-10-CM

## 2021-05-15 DIAGNOSIS — G893 Neoplasm related pain (acute) (chronic): Secondary | ICD-10-CM | POA: Diagnosis not present

## 2021-05-15 DIAGNOSIS — E119 Type 2 diabetes mellitus without complications: Secondary | ICD-10-CM | POA: Diagnosis not present

## 2021-05-15 DIAGNOSIS — Z79899 Other long term (current) drug therapy: Secondary | ICD-10-CM | POA: Insufficient documentation

## 2021-05-15 DIAGNOSIS — D701 Agranulocytosis secondary to cancer chemotherapy: Secondary | ICD-10-CM | POA: Diagnosis not present

## 2021-05-15 DIAGNOSIS — Z5111 Encounter for antineoplastic chemotherapy: Secondary | ICD-10-CM | POA: Insufficient documentation

## 2021-05-15 LAB — CBC WITH DIFFERENTIAL (CANCER CENTER ONLY)
Abs Immature Granulocytes: 0.53 10*3/uL — ABNORMAL HIGH (ref 0.00–0.07)
Basophils Absolute: 0.1 10*3/uL (ref 0.0–0.1)
Basophils Relative: 0 %
Eosinophils Absolute: 0.3 10*3/uL (ref 0.0–0.5)
Eosinophils Relative: 1 %
HCT: 27.2 % — ABNORMAL LOW (ref 39.0–52.0)
Hemoglobin: 8.8 g/dL — ABNORMAL LOW (ref 13.0–17.0)
Immature Granulocytes: 3 %
Lymphocytes Relative: 5 %
Lymphs Abs: 0.9 10*3/uL (ref 0.7–4.0)
MCH: 31.1 pg (ref 26.0–34.0)
MCHC: 32.4 g/dL (ref 30.0–36.0)
MCV: 96.1 fL (ref 80.0–100.0)
Monocytes Absolute: 0.7 10*3/uL (ref 0.1–1.0)
Monocytes Relative: 3 %
Neutro Abs: 16.9 10*3/uL — ABNORMAL HIGH (ref 1.7–7.7)
Neutrophils Relative %: 88 %
Platelet Count: 125 10*3/uL — ABNORMAL LOW (ref 150–400)
RBC: 2.83 MIL/uL — ABNORMAL LOW (ref 4.22–5.81)
RDW: 15.2 % (ref 11.5–15.5)
WBC Count: 19.3 10*3/uL — ABNORMAL HIGH (ref 4.0–10.5)
nRBC: 0 % (ref 0.0–0.2)

## 2021-05-15 LAB — CMP (CANCER CENTER ONLY)
ALT: 53 U/L — ABNORMAL HIGH (ref 0–44)
AST: 24 U/L (ref 15–41)
Albumin: 4.1 g/dL (ref 3.5–5.0)
Alkaline Phosphatase: 255 U/L — ABNORMAL HIGH (ref 38–126)
Anion gap: 10 (ref 5–15)
BUN: 26 mg/dL — ABNORMAL HIGH (ref 8–23)
CO2: 18 mmol/L — ABNORMAL LOW (ref 22–32)
Calcium: 9.1 mg/dL (ref 8.9–10.3)
Chloride: 112 mmol/L — ABNORMAL HIGH (ref 98–111)
Creatinine: 1.5 mg/dL — ABNORMAL HIGH (ref 0.61–1.24)
GFR, Estimated: 49 mL/min — ABNORMAL LOW (ref >60)
Glucose, Bld: 114 mg/dL — ABNORMAL HIGH (ref 70–99)
Potassium: 3.8 mmol/L (ref 3.5–5.1)
Sodium: 140 mmol/L (ref 135–145)
Total Bilirubin: 0.7 mg/dL (ref 0.3–1.2)
Total Protein: 6.8 g/dL (ref 6.5–8.1)

## 2021-05-15 MED ORDER — ROMIPLOSTIM 125 MCG ~~LOC~~ SOLR
2.0000 ug/kg | Freq: Once | SUBCUTANEOUS | Status: AC
  Administered 2021-05-15: 125 ug via SUBCUTANEOUS
  Filled 2021-05-15: qty 0.25

## 2021-05-15 MED ORDER — HEPARIN SOD (PORK) LOCK FLUSH 100 UNIT/ML IV SOLN
500.0000 [IU] | Freq: Once | INTRAVENOUS | Status: AC | PRN
  Administered 2021-05-15: 500 [IU]
  Filled 2021-05-15: qty 5

## 2021-05-15 MED ORDER — SODIUM CHLORIDE 0.9% FLUSH
10.0000 mL | INTRAVENOUS | Status: DC | PRN
  Administered 2021-05-15: 10 mL
  Filled 2021-05-15: qty 10

## 2021-05-15 MED ORDER — FAMOTIDINE 20 MG IN NS 100 ML IVPB
20.0000 mg | Freq: Once | INTRAVENOUS | Status: AC
  Administered 2021-05-15: 20 mg via INTRAVENOUS
  Filled 2021-05-15: qty 100

## 2021-05-15 MED ORDER — PACLITAXEL PROTEIN-BOUND CHEMO INJECTION 100 MG
45.0000 mg/m2 | Freq: Once | INTRAVENOUS | Status: AC
  Administered 2021-05-15: 75 mg via INTRAVENOUS
  Filled 2021-05-15: qty 15

## 2021-05-15 MED ORDER — PROCHLORPERAZINE MALEATE 10 MG PO TABS
10.0000 mg | ORAL_TABLET | Freq: Once | ORAL | Status: AC
  Administered 2021-05-15: 10 mg via ORAL
  Filled 2021-05-15: qty 1

## 2021-05-15 MED ORDER — SODIUM CHLORIDE 0.9 % IV SOLN
Freq: Once | INTRAVENOUS | Status: AC
Start: 2021-05-15 — End: 2021-05-15
  Filled 2021-05-15: qty 250

## 2021-05-15 MED ORDER — SODIUM CHLORIDE 0.9 % IV SOLN
500.0000 mg/m2 | Freq: Once | INTRAVENOUS | Status: AC
  Administered 2021-05-15: 874 mg via INTRAVENOUS
  Filled 2021-05-15: qty 22.99

## 2021-05-15 NOTE — Patient Instructions (Signed)
Implanted Port Home Guide An implanted port is a device that is placed under the skin. It is usually placed in the chest. The device can be used to give IV medicine, to take blood, or for dialysis. You may have an implanted port if: You need IV medicine that would be irritating to the small veins in your hands or arms. You need IV medicines, such as antibiotics, for a long period of time. You need IV nutrition for a long period of time. You need dialysis. When you have a port, your health care provider can choose to use the port instead of veins in your arms for these procedures. You may have fewer limitations when using a port than you would if you used other types of long-term IVs, and you will likely be able to return to normal activities afteryour incision heals. An implanted port has two main parts: Reservoir. The reservoir is the part where a needle is inserted to give medicines or draw blood. The reservoir is round. After it is placed, it appears as a small, raised area under your skin. Catheter. The catheter is a thin, flexible tube that connects the reservoir to a vein. Medicine that is inserted into the reservoir goes into the catheter and then into the vein. How is my port accessed? To access your port: A numbing cream may be placed on the skin over the port site. Your health care provider will put on a mask and sterile gloves. The skin over your port will be cleaned carefully with a germ-killing soap and allowed to dry. Your health care provider will gently pinch the port and insert a needle into it. Your health care provider will check for a blood return to make sure the port is in the vein and is not clogged. If your port needs to remain accessed to get medicine continuously (constant infusion), your health care provider will place a clear bandage (dressing) over the needle site. The dressing and needle will need to be changed every week, or as told by your health care provider. What  is flushing? Flushing helps keep the port from getting clogged. Follow instructions from your health care provider about how and when to flush the port. Ports are usually flushed with saline solution or a medicine called heparin. The need for flushing will depend on how the port is used: If the port is only used from time to time to give medicines or draw blood, the port may need to be flushed: Before and after medicines have been given. Before and after blood has been drawn. As part of routine maintenance. Flushing may be recommended every 4-6 weeks. If a constant infusion is running, the port may not need to be flushed. Throw away any syringes in a disposal container that is meant for sharp items (sharps container). You can buy a sharps container from a pharmacy, or you can make one by using an empty hard plastic bottle with a cover. How long will my port stay implanted? The port can stay in for as long as your health care provider thinks it is needed. When it is time for the port to come out, a surgery will be done to remove it. The surgery will be similar to the procedure that was done to putthe port in. Follow these instructions at home:  Flush your port as told by your health care provider. If you need an infusion over several days, follow instructions from your health care provider about how to take   care of your port site. Make sure you: Wash your hands with soap and water before you change your dressing. If soap and water are not available, use alcohol-based hand sanitizer. Change your dressing as told by your health care provider. Place any used dressings or infusion bags into a plastic bag. Throw that bag in the trash. Keep the dressing that covers the needle clean and dry. Do not get it wet. Do not use scissors or sharp objects near the tube. Keep the tube clamped, unless it is being used. Check your port site every day for signs of infection. Check for: Redness, swelling, or  pain. Fluid or blood. Pus or a bad smell. Protect the skin around the port site. Avoid wearing bra straps that rub or irritate the site. Protect the skin around your port from seat belts. Place a soft pad over your chest if needed. Bathe or shower as told by your health care provider. The site may get wet as long as you are not actively receiving an infusion. Return to your normal activities as told by your health care provider. Ask your health care provider what activities are safe for you. Carry a medical alert card or wear a medical alert bracelet at all times. This will let health care providers know that you have an implanted port in case of an emergency. Get help right away if: You have redness, swelling, or pain at the port site. You have fluid or blood coming from your port site. You have pus or a bad smell coming from the port site. You have a fever. Summary Implanted ports are usually placed in the chest for long-term IV access. Follow instructions from your health care provider about flushing the port and changing bandages (dressings). Take care of the area around your port by avoiding clothing that puts pressure on the area, and by watching for signs of infection. Protect the skin around your port from seat belts. Place a soft pad over your chest if needed. Get help right away if you have a fever or you have redness, swelling, pain, drainage, or a bad smell at the port site. This information is not intended to replace advice given to you by your health care provider. Make sure you discuss any questions you have with your healthcare provider. Document Revised: 03/17/2020 Document Reviewed: 03/17/2020 Elsevier Patient Education  2022 [employer]  

## 2021-05-15 NOTE — Patient Instructions (Signed)
Aaron Johnston  Discharge Instructions: Thank you for choosing [facility] to provide your oncology and hematology care.   If you have a lab appointment with the [facility], please go directly to the [facility] and check in at the registration area.   Wear comfortable clothing and clothing appropriate for easy access to any Portacath or PICC line.   We strive to give you quality time with your provider. You may need to reschedule your appointment if you arrive late (15 or more minutes).  Arriving late affects you and other patients whose appointments are after yours.  Also, if you miss three or more appointments without notifying the office, you may be dismissed from the clinic at the provider's discretion.      For prescription refill requests, have your pharmacy contact our office and allow 72 hours for refills to be completed.    Today you received the following chemotherapy and/or immunotherapy agents paclitaxel-protein bound, gemcitabine, n-Plate   To help prevent nausea and vomiting after your treatment, we encourage you to take your nausea medication as directed.  BELOW ARE SYMPTOMS THAT SHOULD BE REPORTED IMMEDIATELY: *FEVER GREATER THAN 100.4 F (38 C) OR HIGHER *CHILLS OR SWEATING *NAUSEA AND VOMITING THAT IS NOT CONTROLLED WITH YOUR NAUSEA MEDICATION *UNUSUAL SHORTNESS OF BREATH *UNUSUAL BRUISING OR BLEEDING *URINARY PROBLEMS (pain or burning when urinating, or frequent urination) *BOWEL PROBLEMS (unusual diarrhea, constipation, pain near the anus) TENDERNESS IN MOUTH AND THROAT WITH OR WITHOUT PRESENCE OF ULCERS (sore throat, sores in mouth, or a toothache) UNUSUAL RASH, SWELLING OR PAIN  UNUSUAL VAGINAL DISCHARGE OR ITCHING   Items with * indicate a potential emergency and should be followed up as soon as possible or go to the Emergency Department if any problems should occur.  Please show the CHEMOTHERAPY ALERT CARD or  IMMUNOTHERAPY ALERT CARD at check-in to the Emergency Department and triage nurse.  Should you have questions after your visit or need to cancel or reschedule your appointment, please contact [facility]  Dept: [phone]  and follow the prompts.  Office hours are 8:00 a.m. to 4:30 p.m. Monday - Friday. Please note that voicemails left after 4:00 p.m. may not be returned until the following business day.  We are closed weekends and major holidays. You have access to a nurse at all times for urgent questions. Please call the main number to the clinic Dept: [phone] and follow the prompts.   For any non-urgent questions, you may also contact your provider using MyChart. We now offer e-Visits for anyone 76 and older to request care online for non-urgent symptoms. For details visit [email or web address].   Also download the MyChart app! Go to the app store, search "MyChart", open the app, select [facility], and log in with your MyChart username and password.  Due to Covid, a mask is required upon entering the hospital/clinic. If you do not have a mask, one will be given to you upon arrival. For doctor visits, patients may have 1 support person aged 56 or older with them. For treatment visits, patients cannot have anyone with them due to current Covid guidelines and our immunocompromised population.   Nanoparticle Albumin-Bound Paclitaxel injection What is this medication? NANOPARTICLE ALBUMIN-BOUND PACLITAXEL (Na no PAHR ti kuhl al BYOO muhn-bound PAK li TAX el) is a chemotherapy drug. It targets fast dividing cells, like cancer cells, and causes these cells to die. This medicine is used to treatadvanced breast cancer,  lung cancer, and pancreatic cancer. This medicine may be used for other purposes; ask your health care provider orpharmacist if you have questions. COMMON BRAND NAME(S): Abraxane What should I tell my care team before I take this medication? They need  to know if you have any of these conditions: kidney disease liver disease low blood counts, like low white cell, platelet, or red cell counts lung or breathing disease, like asthma tingling of the fingers or toes, or other nerve disorder an unusual or allergic reaction to paclitaxel, albumin, other chemotherapy, other medicines, foods, dyes, or preservatives pregnant or trying to get pregnant breast-feeding How should I use this medication? This drug is given as an infusion into a vein. It is administered in a hospitalor clinic by a specially trained health care professional. Talk to your pediatrician regarding the use of this medicine in children.Special care may be needed. Overdosage: If you think you have taken too much of this medicine contact apoison control center or emergency room at once. NOTE: This medicine is only for you. Do not share this medicine with others. What if I miss a dose? It is important not to miss your dose. Call your doctor or health careprofessional if you are unable to keep an appointment. What may interact with this medication? This medicine may interact with the following medications: antiviral medicines for hepatitis, HIV or AIDS certain antibiotics like erythromycin and clarithromycin certain medicines for fungal infections like ketoconazole and itraconazole certain medicines for seizures like carbamazepine, phenobarbital, phenytoin gemfibrozil nefazodone rifampin [facility]. John's wort This list may not describe all possible interactions. Give your health care provider a list of all the medicines, herbs, non-prescription drugs, or dietary supplements you use. Also tell them if you smoke, drink alcohol, or use illegaldrugs. Some items may interact with your medicine. What should I watch for while using this medication? Your condition will be monitored carefully while you are receiving this medicine. You will need important blood work done while you are taking  thismedicine. This medicine can cause serious allergic reactions. If you experience allergic reactions like skin rash, itching or hives, swelling of the face, lips, ortongue, tell your doctor or health care professional right away. In some cases, you may be given additional medicines to help with side effects.Follow all directions for their use. This drug may make you feel generally unwell. This is not uncommon, as chemotherapy can affect healthy cells as well as cancer cells. Report any side effects. Continue your course of treatment even though you feel ill unless yourdoctor tells you to stop. Call your doctor or health care professional for advice if you get a fever, chills or sore throat, or other symptoms of a cold or flu. Do not treat yourself. This drug decreases your body's ability to fight infections. Try toavoid being around people who are sick. This medicine may increase your risk to bruise or bleed. Call your doctor orhealth care professional if you notice any unusual bleeding. Be careful brushing and flossing your teeth or using a toothpick because you may get an infection or bleed more easily. If you have any dental work done,tell your dentist you are receiving this medicine. Avoid taking products that contain aspirin, acetaminophen, ibuprofen, naproxen, or ketoprofen unless instructed by your doctor. These medicines may hide afever. Do not become pregnant while taking this medicine or for 6 months after stopping it. Women should inform their doctor if they wish to become pregnant or think they might be pregnant. Men should not father  a child while taking this medicine or for 3 months after stopping it. There is a potential for serious side effects to an unborn child. Talk to your health care professionalor pharmacist for more information. Do not breast-feed an infant while taking this medicine or for 2 weeks afterstopping it. This medicine may interfere with the ability to get pregnant or to  father a child. You should talk to your doctor or health care professional if you areconcerned about your fertility. What side effects may I notice from receiving this medication? Side effects that you should report to your doctor or health care professionalas soon as possible: allergic reactions like skin rash, itching or hives, swelling of the face, lips, or tongue breathing problems changes in vision fast, irregular heartbeat low blood pressure mouth sores pain, tingling, numbness in the hands or feet signs of decreased platelets or bleeding - bruising, pinpoint red spots on the skin, black, tarry stools, blood in the urine signs of decreased red blood cells - unusually weak or tired, feeling faint or lightheaded, falls signs of infection - fever or chills, cough, sore throat, pain or difficulty passing urine signs and symptoms of liver injury like dark yellow or brown urine; general ill feeling or flu-like symptoms; light-colored stools; loss of appetite; nausea; right upper belly pain; unusually weak or tired; yellowing of the eyes or skin swelling of the ankles, feet, hands unusually slow heartbeat Side effects that usually do not require medical attention (report to yourdoctor or health care professional if they continue or are bothersome): diarrhea hair loss loss of appetite nausea, vomiting tiredness This list may not describe all possible side effects. Call your doctor for medical advice about side effects. You may report side effects to FDA at1-800-FDA-1088. Where should I keep my medication? This drug is given in a hospital or clinic and will not be stored at home. NOTE: This sheet is a summary. It may not cover all possible information. If you have questions about this medicine, talk to your doctor, pharmacist, orhealth care provider.  2022 Elsevier/Gold Standard (2017-07-05 13:03:45)  Gemcitabine injection What is this medication? GEMCITABINE (jem SYE ta been) is a  chemotherapy drug. This medicine is used to treat many types of cancer like breast cancer, lung cancer, pancreatic cancer,and ovarian cancer. This medicine may be used for other purposes; ask your health care provider orpharmacist if you have questions. COMMON BRAND NAME(S): Gemzar, Infugem What should I tell my care team before I take this medication? They need to know if you have any of these conditions: blood disorders infection kidney disease liver disease lung or breathing disease, like asthma recent or ongoing radiation therapy an unusual or allergic reaction to gemcitabine, other chemotherapy, other medicines, foods, dyes, or preservatives pregnant or trying to get pregnant breast-feeding How should I use this medication? This drug is given as an infusion into a vein. It is administered in a hospitalor clinic by a specially trained health care professional. Talk to your pediatrician regarding the use of this medicine in children.Special care may be needed. Overdosage: If you think you have taken too much of this medicine contact apoison control center or emergency room at once. NOTE: This medicine is only for you. Do not share this medicine with others. What if I miss a dose? It is important not to miss your dose. Call your doctor or health careprofessional if you are unable to keep an appointment. What may interact with this medication? medicines to increase blood counts like filgrastim,  pegfilgrastim, sargramostim some other chemotherapy drugs like cisplatin vaccines Talk to your doctor or health care professional before taking any of thesemedicines: acetaminophen aspirin ibuprofen ketoprofen naproxen This list may not describe all possible interactions. Give your health care provider a list of all the medicines, herbs, non-prescription drugs, or dietary supplements you use. Also tell them if you smoke, drink alcohol, or use illegaldrugs. Some items may interact with your  medicine. What should I watch for while using this medication? Visit your doctor for checks on your progress. This drug may make you feel generally unwell. This is not uncommon, as chemotherapy can affect healthy cells as well as cancer cells. Report any side effects. Continue your course oftreatment even though you feel ill unless your doctor tells you to stop. In some cases, you may be given additional medicines to help with side effects.Follow all directions for their use. Call your doctor or health care professional for advice if you get a fever, chills or sore throat, or other symptoms of a cold or flu. Do not treat yourself. This drug decreases your body's ability to fight infections. Try toavoid being around people who are sick. This medicine may increase your risk to bruise or bleed. Call your doctor orhealth care professional if you notice any unusual bleeding. Be careful brushing and flossing your teeth or using a toothpick because you may get an infection or bleed more easily. If you have any dental work done,tell your dentist you are receiving this medicine. Avoid taking products that contain aspirin, acetaminophen, ibuprofen, naproxen, or ketoprofen unless instructed by your doctor. These medicines may hide afever. Do not become pregnant while taking this medicine or for 6 months after stopping it. Women should inform their doctor if they wish to become pregnant or think they might be pregnant. Men should not father a child while taking this medicine and for 3 months after stopping it. There is a potential for serious side effects to an unborn child. Talk to your health care professional or pharmacist for more information. Do not breast-feed an infant while takingthis medicine or for at least 1 week after stopping it. Men should inform their doctors if they wish to father a child. This medicine may lower sperm counts. Talk with your doctor or health care professional ifyou are concerned about  your fertility. What side effects may I notice from receiving this medication? Side effects that you should report to your doctor or health care professionalas soon as possible: allergic reactions like skin rash, itching or hives, swelling of the face, lips, or tongue breathing problems pain, redness, or irritation at site where injected signs and symptoms of a dangerous change in heartbeat or heart rhythm like chest pain; dizziness; fast or irregular heartbeat; palpitations; feeling faint or lightheaded, falls; breathing problems signs of decreased platelets or bleeding - bruising, pinpoint red spots on the skin, black, tarry stools, blood in the urine signs of decreased red blood cells - unusually weak or tired, feeling faint or lightheaded, falls signs of infection - fever or chills, cough, sore throat, pain or difficulty passing urine signs and symptoms of kidney injury like trouble passing urine or change in the amount of urine signs and symptoms of liver injury like dark yellow or brown urine; general ill feeling or flu-like symptoms; light-colored stools; loss of appetite; nausea; right upper belly pain; unusually weak or tired; yellowing of the eyes or skin swelling of ankles, feet, hands Side effects that usually do not require medical attention (report  to yourdoctor or health care professional if they continue or are bothersome): constipation diarrhea hair loss loss of appetite nausea rash vomiting This list may not describe all possible side effects. Call your doctor for medical advice about side effects. You may report side effects to FDA at1-800-FDA-1088. Where should I keep my medication? This drug is given in a hospital or clinic and will not be stored at home. NOTE: This sheet is a summary. It may not cover all possible information. If you have questions about this medicine, talk to your doctor, pharmacist, orhealth care provider.  2022 Elsevier/Gold Standard (2018-01-25  18:06:11)  Romiplostim injection What is this medication? ROMIPLOSTIM (roe mi PLOE stim) helps your body make more platelets. This medicine is used to treat low platelets caused by chronic idiopathic thrombocytopenic purpura (ITP) or a bone marrow syndrome caused by radiationsickness. This medicine may be used for other purposes; ask your health care provider orpharmacist if you have questions. COMMON BRAND NAME(S): Nplate What should I tell my care team before I take this medication? They need to know if you have any of these conditions: blood clots myelodysplastic syndrome an unusual or allergic reaction to romiplostim, mannitol, other medicines, foods, dyes, or preservatives pregnant or trying to get pregnant breast-feeding How should I use this medication? This medicine is injected under the skin. It is given by a health care providerin a hospital or clinic setting. A special MedGuide will be given to you before each treatment. Be sure to readthis information carefully each time. Talk to your health care provider about the use of this medicine in children. While it may be prescribed for children as young as newborns for selectedconditions, precautions do apply. Overdosage: If you think you have taken too much of this medicine contact apoison control center or emergency room at once. NOTE: This medicine is only for you. Do not share this medicine with others. What if I miss a dose? Keep appointments for follow-up doses. It is important not to miss your dose.Call your health care provider if you are unable to keep an appointment. What may interact with this medication? Interactions are not expected. This list may not describe all possible interactions. Give your health care provider a list of all the medicines, herbs, non-prescription drugs, or dietary supplements you use. Also tell them if you smoke, drink alcohol, or use illegaldrugs. Some items may interact with your medicine. What  should I watch for while using this medication? Visit your health care provider for regular checks on your progress. You may need blood work done while you are taking this medicine. Your condition will be monitored carefully while you are receiving this medicine. It is important notto miss any appointments. What side effects may I notice from receiving this medication? Side effects that you should report to your doctor or health care professionalas soon as possible: allergic reactions (skin rash, itching or hives; swelling of the face, lips, or tongue) bleeding (bloody or black, tarry stools; red or dark brown urine; spitting up blood or brown material that looks like coffee grounds; red spots on the skin; unusual bruising or bleeding from the eyes, gums, or nose) blood clot (chest pain; shortness of breath; pain, swelling, or warmth in the leg) stroke (changes in vision; confusion; trouble speaking or understanding; severe headaches; sudden numbness or weakness of the face, arm or leg; trouble walking; dizziness; loss of balance or coordination) Side effects that usually do not require medical attention (report to yourdoctor or health care professional  if they continue or are bothersome): diarrhea dizziness headache joint pain muscle pain stomach pain trouble sleeping This list may not describe all possible side effects. Call your doctor for medical advice about side effects. You may report side effects to FDA at1-800-FDA-1088. Where should I keep my medication? This medicine is given in a hospital or clinic. It will not be stored at home. NOTE: This sheet is a summary. It may not cover all possible information. If you have questions about this medicine, talk to your doctor, pharmacist, orhealth care provider.  2022 Elsevier/Gold Standard (2019-12-17 10:28:13)

## 2021-05-15 NOTE — Progress Notes (Signed)
Aaron Johnston   Diagnosis: Pancreas cancer  INTERVAL HISTORY:   Aaron Johnston returns as scheduled.  He completed another cycle of gemcitabine/Abraxane on 05/01/2021.  He continues to receive G-CSF support and weekly Nplate.  No bleeding.  He did not have a rash following the most recent cycle of chemotherapy.  He takes Pepcid and Benadryl at home.  No neuropathy symptoms.  He continues to have diarrhea.  He was evaluated by gastroenterology at [facility] and continues Imodium/Lomotil.  He has 3-4 bowel movements per day.  The GI team plans to adjust the pancreatic enzyme supplement and diarrhea regimen.  Objective:  Vital signs in last 24 hours:  Blood pressure 109/66, pulse 73, temperature 98.1 F (36.7 C), temperature source Oral, resp. rate 20, height '5\' 9"'  (1.753 m), weight 136 lb 9.6 oz (62 kg), SpO2 100 %.    HEENT: No thrush or ulcers Resp: Lungs clear bilaterally Cardio: Regular rate and rhythm GI: No hepatosplenomegaly Vascular: No leg edema  Skin: No rash, Palms without erythema  Portacath/PICC-without erythema  Lab Results:  Lab Results  Component Value Date   WBC 19.3 (H) 05/15/2021   HGB 8.8 (L) 05/15/2021   HCT 27.2 (L) 05/15/2021   MCV 96.1 05/15/2021   PLT 125 (L) 05/15/2021   NEUTROABS 16.9 (H) 05/15/2021    CMP  Lab Results  Component Value Date   NA 140 05/01/2021   K 3.8 05/01/2021   CL 113 (H) 05/01/2021   CO2 19 (L) 05/01/2021   GLUCOSE 179 (H) 05/01/2021   BUN 25 (H) 05/01/2021   CREATININE 1.37 (H) 05/01/2021   CALCIUM 9.0 05/01/2021   PROT 6.9 05/01/2021   ALBUMIN 4.1 05/01/2021   AST 45 (H) 05/01/2021   ALT 82 (H) 05/01/2021   ALKPHOS 261 (H) 05/01/2021   BILITOT 0.7 05/01/2021   GFRNONAA 55 (L) 05/01/2021   GFRAA NOT CALCULATED 02/23/2021     Medications: I have reviewed the patient's current medications.   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, mild biliary ductal dilatation, diffuse hypodense/hypoenhancing areas within the liver (edema versus infiltrating neoplasm), 2 x 1.7 cm area of focal prominence in the pancreas EUS 07/10/2020-no pancreas mass identified, 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, 1.5 cm aortocaval node biopsy-scant lymphoid material, no malignancy Diagnostic laparoscopy 07/17/2020-segment 3 and 4 liver nodules, adenocarcinoma, positive for pankeratin, CK7 and CK20, partially positive for TTF-1, negative for CDX2 Foundation 1-microsatellite stable, tumor mutation burden 4, K-ras G12D, subclonal RB1 alteration Elevated CA 19-9 Cycle 1 FOLFOX 07/28/2020 Cycle 2 FOLFOX 08/18/2020, oxaliplatin dose reduced due to thrombocytopenia Cycle 3 FOLFOX 09/08/2020 Cycle 4 FOLFOX 09/29/2020  Cycle 5 FOLFOX 10/20/2020 CTs 11/06/2020-grossly stable infiltrative mass within the pancreatic head and porta hepatis.  New small low-density liver lesions.  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 Cycle 6 FOLFOX 11/10/2021  Cycle 7 FOLFOX 12/01/2020 MRI liver 12/04/2020-no significant change in posttreatment appearance of the pancreatic head.  Numerous intrinsically low signal hypoenhancing lesions of the liver parenchyma predominantly observed in the right lobe of the liver  and liver dome, measuring no greater than 8 mm and as seen on recent prior CT of the abdomen/pelvis. Cycle 8 FOLFOX 12/22/2020 Cycle 9 FOLFOX 01/12/2021 Cycle 10 FOLFOX 02/02/2021 MRI liver 02/19/2021-no change in pancreas  head mass, multiple hypoenhancing liver lesions are new and increased in size, effacement of portal vein by pancreas head mass with cavernous transformation, no change in mild splenomegaly Cycle 1 gemcitabine/Abraxane 03/12/2021 Cycle 2 gemcitabine/Abraxane 03/27/2021- dose reductions secondary to neutropenia Cycle 3 gemcitabine/Abraxane 04/17/2021-dose reductions due to thrombocytopenia, white cell growth factor support added Cycle 4 gemcitabine/Abraxane 05/01/2021, Ziextenzo Cycle 5 gemcitabine/Abraxane 05/15/2021, Ziextenzo   Chronic thrombocytopenia- weekly Nplate beginning 4/40/3474 Diabetes Hypertension Abdomen/back pain secondary 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  He will complete another cycle of gemcitabine/Abraxane today.  Thrombocytopenia has improved with Nplate therapy.  He will receive G-CSF following chemotherapy and he continues weekly Nplate.  He will return for an office visit on 05/27/2021.  He will undergo a restaging liver MRI prior to the next office visit.  He will continue follow-up with gastroenterology [facility] for evaluation and management of diarrhea.  Aaron Coder, MD  05/15/2021  8:53 AM

## 2021-05-16 ENCOUNTER — Other Ambulatory Visit: Payer: Self-pay

## 2021-05-16 ENCOUNTER — Inpatient Hospital Stay: Payer: Medicare Other

## 2021-05-16 VITALS — BP 124/63 | HR 75 | Temp 97.7°F | Resp 20 | Ht 69.0 in

## 2021-05-16 DIAGNOSIS — C250 Malignant neoplasm of head of pancreas: Secondary | ICD-10-CM

## 2021-05-16 DIAGNOSIS — Z5111 Encounter for antineoplastic chemotherapy: Secondary | ICD-10-CM | POA: Diagnosis not present

## 2021-05-16 LAB — CANCER ANTIGEN 19-9: CA 19-9: 2690 U/mL — ABNORMAL HIGH (ref 0–35)

## 2021-05-16 MED ORDER — PEGFILGRASTIM-BMEZ 6 MG/0.6ML ~~LOC~~ SOSY
6.0000 mg | PREFILLED_SYRINGE | Freq: Once | SUBCUTANEOUS | Status: AC
Start: 2021-05-16 — End: 2021-05-16
  Administered 2021-05-16: 6 mg via SUBCUTANEOUS

## 2021-05-16 NOTE — Patient Instructions (Signed)

## 2021-05-22 ENCOUNTER — Inpatient Hospital Stay: Payer: Medicare Other

## 2021-05-22 ENCOUNTER — Other Ambulatory Visit: Payer: Self-pay | Admitting: Nurse Practitioner

## 2021-05-22 ENCOUNTER — Other Ambulatory Visit: Payer: Self-pay

## 2021-05-22 VITALS — BP 109/58 | HR 73 | Temp 98.2°F | Resp 20

## 2021-05-22 DIAGNOSIS — C250 Malignant neoplasm of head of pancreas: Secondary | ICD-10-CM

## 2021-05-22 DIAGNOSIS — Z5111 Encounter for antineoplastic chemotherapy: Secondary | ICD-10-CM | POA: Diagnosis not present

## 2021-05-22 DIAGNOSIS — Z95828 Presence of other vascular implants and grafts: Secondary | ICD-10-CM

## 2021-05-22 LAB — CBC WITH DIFFERENTIAL (CANCER CENTER ONLY)
Abs Immature Granulocytes: 0.47 10*3/uL — ABNORMAL HIGH (ref 0.00–0.07)
Basophils Absolute: 0.1 10*3/uL (ref 0.0–0.1)
Basophils Relative: 0 %
Eosinophils Absolute: 0.2 10*3/uL (ref 0.0–0.5)
Eosinophils Relative: 1 %
HCT: 25.8 % — ABNORMAL LOW (ref 39.0–52.0)
Hemoglobin: 8.2 g/dL — ABNORMAL LOW (ref 13.0–17.0)
Immature Granulocytes: 2 %
Lymphocytes Relative: 6 %
Lymphs Abs: 1.1 10*3/uL (ref 0.7–4.0)
MCH: 30.9 pg (ref 26.0–34.0)
MCHC: 31.8 g/dL (ref 30.0–36.0)
MCV: 97.4 fL (ref 80.0–100.0)
Monocytes Absolute: 0.9 10*3/uL (ref 0.1–1.0)
Monocytes Relative: 5 %
Neutro Abs: 16.4 10*3/uL — ABNORMAL HIGH (ref 1.7–7.7)
Neutrophils Relative %: 86 %
Platelet Count: 122 10*3/uL — ABNORMAL LOW (ref 150–400)
RBC: 2.65 MIL/uL — ABNORMAL LOW (ref 4.22–5.81)
RDW: 15.3 % (ref 11.5–15.5)
WBC Count: 19.2 10*3/uL — ABNORMAL HIGH (ref 4.0–10.5)
nRBC: 0 % (ref 0.0–0.2)

## 2021-05-22 LAB — CMP (CANCER CENTER ONLY)
ALT: 48 U/L — ABNORMAL HIGH (ref 0–44)
AST: 26 U/L (ref 15–41)
Albumin: 3.9 g/dL (ref 3.5–5.0)
Alkaline Phosphatase: 305 U/L — ABNORMAL HIGH (ref 38–126)
Anion gap: 8 (ref 5–15)
BUN: 24 mg/dL — ABNORMAL HIGH (ref 8–23)
CO2: 20 mmol/L — ABNORMAL LOW (ref 22–32)
Calcium: 9.1 mg/dL (ref 8.9–10.3)
Chloride: 113 mmol/L — ABNORMAL HIGH (ref 98–111)
Creatinine: 1.4 mg/dL — ABNORMAL HIGH (ref 0.61–1.24)
GFR, Estimated: 53 mL/min — ABNORMAL LOW (ref >60)
Glucose, Bld: 131 mg/dL — ABNORMAL HIGH (ref 70–99)
Potassium: 4.2 mmol/L (ref 3.5–5.1)
Sodium: 141 mmol/L (ref 135–145)
Total Bilirubin: 0.6 mg/dL (ref 0.3–1.2)
Total Protein: 6.6 g/dL (ref 6.5–8.1)

## 2021-05-22 MED ORDER — HEPARIN SOD (PORK) LOCK FLUSH 100 UNIT/ML IV SOLN
500.0000 [IU] | Freq: Once | INTRAVENOUS | Status: AC | PRN
  Administered 2021-05-22: 500 [IU]
  Filled 2021-05-22: qty 5

## 2021-05-22 MED ORDER — SODIUM CHLORIDE 0.9% FLUSH
10.0000 mL | INTRAVENOUS | Status: DC | PRN
  Administered 2021-05-22: 10 mL
  Filled 2021-05-22: qty 10

## 2021-05-22 MED ORDER — ROMIPLOSTIM 125 MCG ~~LOC~~ SOLR
2.0000 ug/kg | Freq: Once | SUBCUTANEOUS | Status: AC
Start: 2021-05-22 — End: 2021-05-22
  Administered 2021-05-22: 125 ug via SUBCUTANEOUS
  Filled 2021-05-22: qty 0.25

## 2021-05-22 NOTE — Patient Instructions (Signed)
Romiplostim injection What is this medication? ROMIPLOSTIM (roe mi PLOE stim) helps your body make more platelets. This medicine is used to treat low platelets caused by chronic idiopathic thrombocytopenic purpura (ITP) or a bone marrow syndrome caused by radiation sickness. This medicine may be used for other purposes; ask your health care provider or pharmacist if you have questions. COMMON BRAND NAME(S): Nplate What should I tell my care team before I take this medication? They need to know if you have any of these conditions: blood clots myelodysplastic syndrome an unusual or allergic reaction to romiplostim, mannitol, other medicines, foods, dyes, or preservatives pregnant or trying to get pregnant breast-feeding How should I use this medication? This medicine is injected under the skin. It is given by a health care provider in a hospital or clinic setting. A special MedGuide will be given to you before each treatment. Be sure to read this information carefully each time. Talk to your health care provider about the use of this medicine in children. While it may be prescribed for children as young as newborns for selected conditions, precautions do apply. Overdosage: If you think you have taken too much of this medicine contact a poison control center or emergency room at once. NOTE: This medicine is only for you. Do not share this medicine with others. What if I miss a dose? Keep appointments for follow-up doses. It is important not to miss your dose. Call your health care provider if you are unable to keep an appointment. What may interact with this medication? Interactions are not expected. This list may not describe all possible interactions. Give your health care provider a list of all the medicines, herbs, non-prescription drugs, or dietary supplements you use. Also tell them if you smoke, drink alcohol, or use illegal drugs. Some items may interact with your medicine. What should I  watch for while using this medication? Visit your health care provider for regular checks on your progress. You may need blood work done while you are taking this medicine. Your condition will be monitored carefully while you are receiving this medicine. It is important not to miss any appointments. What side effects may I notice from receiving this medication? Side effects that you should report to your doctor or health care professional as soon as possible: allergic reactions (skin rash, itching or hives; swelling of the face, lips, or tongue) bleeding (bloody or black, tarry stools; red or dark brown urine; spitting up blood or brown material that looks like coffee grounds; red spots on the skin; unusual bruising or bleeding from the eyes, gums, or nose) blood clot (chest pain; shortness of breath; pain, swelling, or warmth in the leg) stroke (changes in vision; confusion; trouble speaking or understanding; severe headaches; sudden numbness or weakness of the face, arm or leg; trouble walking; dizziness; loss of balance or coordination) Side effects that usually do not require medical attention (report to your doctor or health care professional if they continue or are bothersome): diarrhea dizziness headache joint pain muscle pain stomach pain trouble sleeping This list may not describe all possible side effects. Call your doctor for medical advice about side effects. You may report side effects to FDA at 1-800-FDA-1088. Where should I keep my medication? This medicine is given in a hospital or clinic. It will not be stored at home. NOTE: This sheet is a summary. It may not cover all possible information. If you have questions about this medicine, talk to your doctor, pharmacist, or health care provider.    2022 Elsevier/Gold Standard (2019-12-17 10:28:13)  

## 2021-05-23 ENCOUNTER — Other Ambulatory Visit: Payer: Self-pay | Admitting: Oncology

## 2021-05-23 DIAGNOSIS — C250 Malignant neoplasm of head of pancreas: Secondary | ICD-10-CM

## 2021-05-23 LAB — CANCER ANTIGEN 19-9: CA 19-9: 2404 U/mL — ABNORMAL HIGH (ref 0–35)

## 2021-05-24 ENCOUNTER — Other Ambulatory Visit: Payer: Self-pay | Admitting: Oncology

## 2021-05-26 ENCOUNTER — Other Ambulatory Visit: Payer: Self-pay

## 2021-05-26 ENCOUNTER — Other Ambulatory Visit: Payer: Self-pay | Admitting: Oncology

## 2021-05-26 ENCOUNTER — Encounter: Payer: Self-pay | Admitting: Nurse Practitioner

## 2021-05-26 ENCOUNTER — Ambulatory Visit (HOSPITAL_COMMUNITY)
Admission: RE | Admit: 2021-05-26 | Discharge: 2021-05-26 | Disposition: A | Discharge status: Home / Self Care | Payer: Medicare Other | Source: Ambulatory Visit | Attending: Oncology | Admitting: Oncology

## 2021-05-26 ENCOUNTER — Encounter: Payer: Self-pay | Admitting: Oncology

## 2021-05-26 DIAGNOSIS — C250 Malignant neoplasm of head of pancreas: Secondary | ICD-10-CM

## 2021-05-26 MED ORDER — GADOBUTROL 1 MMOL/ML IV SOLN
6.0000 mL | Freq: Once | INTRAVENOUS | Status: AC | PRN
  Administered 2021-05-26: 6 mL via INTRAVENOUS

## 2021-05-28 ENCOUNTER — Inpatient Hospital Stay: Payer: Medicare Other

## 2021-05-28 ENCOUNTER — Other Ambulatory Visit: Payer: Self-pay | Admitting: Oncology

## 2021-05-28 ENCOUNTER — Inpatient Hospital Stay (HOSPITAL_BASED_OUTPATIENT_CLINIC_OR_DEPARTMENT_OTHER): Payer: Medicare Other | Admitting: Oncology

## 2021-05-28 ENCOUNTER — Other Ambulatory Visit: Payer: Self-pay

## 2021-05-28 VITALS — BP 109/59 | HR 69 | Temp 98.2°F | Resp 18 | Wt 135.8 lb

## 2021-05-28 DIAGNOSIS — C250 Malignant neoplasm of head of pancreas: Secondary | ICD-10-CM | POA: Diagnosis not present

## 2021-05-28 DIAGNOSIS — Z95828 Presence of other vascular implants and grafts: Secondary | ICD-10-CM

## 2021-05-28 DIAGNOSIS — Z5111 Encounter for antineoplastic chemotherapy: Secondary | ICD-10-CM | POA: Diagnosis not present

## 2021-05-28 LAB — CMP (CANCER CENTER ONLY)
ALT: 38 U/L (ref 0–44)
AST: 29 U/L (ref 15–41)
Albumin: 3.9 g/dL (ref 3.5–5.0)
Alkaline Phosphatase: 249 U/L — ABNORMAL HIGH (ref 38–126)
Anion gap: 9 (ref 5–15)
BUN: 20 mg/dL (ref 8–23)
CO2: 17 mmol/L — ABNORMAL LOW (ref 22–32)
Calcium: 8.5 mg/dL — ABNORMAL LOW (ref 8.9–10.3)
Chloride: 115 mmol/L — ABNORMAL HIGH (ref 98–111)
Creatinine: 1.37 mg/dL — ABNORMAL HIGH (ref 0.61–1.24)
GFR, Estimated: 55 mL/min — ABNORMAL LOW (ref >60)
Glucose, Bld: 128 mg/dL — ABNORMAL HIGH (ref 70–99)
Potassium: 3.8 mmol/L (ref 3.5–5.1)
Sodium: 141 mmol/L (ref 135–145)
Total Bilirubin: 0.7 mg/dL (ref 0.3–1.2)
Total Protein: 6.4 g/dL — ABNORMAL LOW (ref 6.5–8.1)

## 2021-05-28 LAB — CBC WITH DIFFERENTIAL (CANCER CENTER ONLY)
Abs Immature Granulocytes: 0.38 10*3/uL — ABNORMAL HIGH (ref 0.00–0.07)
Basophils Absolute: 0.1 10*3/uL (ref 0.0–0.1)
Basophils Relative: 0 %
Eosinophils Absolute: 0.3 10*3/uL (ref 0.0–0.5)
Eosinophils Relative: 2 %
HCT: 25.8 % — ABNORMAL LOW (ref 39.0–52.0)
Hemoglobin: 8.3 g/dL — ABNORMAL LOW (ref 13.0–17.0)
Immature Granulocytes: 2 %
Lymphocytes Relative: 5 %
Lymphs Abs: 0.8 10*3/uL (ref 0.7–4.0)
MCH: 31.1 pg (ref 26.0–34.0)
MCHC: 32.2 g/dL (ref 30.0–36.0)
MCV: 96.6 fL (ref 80.0–100.0)
Monocytes Absolute: 0.5 10*3/uL (ref 0.1–1.0)
Monocytes Relative: 3 %
Neutro Abs: 14 10*3/uL — ABNORMAL HIGH (ref 1.7–7.7)
Neutrophils Relative %: 88 %
Platelet Count: 89 10*3/uL — ABNORMAL LOW (ref 150–400)
RBC: 2.67 MIL/uL — ABNORMAL LOW (ref 4.22–5.81)
RDW: 16 % — ABNORMAL HIGH (ref 11.5–15.5)
WBC Count: 16 10*3/uL — ABNORMAL HIGH (ref 4.0–10.5)
nRBC: 0 % (ref 0.0–0.2)

## 2021-05-28 LAB — SAMPLE TO BLOOD BANK

## 2021-05-28 MED ORDER — FAMOTIDINE 20 MG IN NS 100 ML IVPB
20.0000 mg | Freq: Once | INTRAVENOUS | Status: AC
  Administered 2021-05-28: 20 mg via INTRAVENOUS
  Filled 2021-05-28: qty 100

## 2021-05-28 MED ORDER — PACLITAXEL PROTEIN-BOUND CHEMO INJECTION 100 MG
45.0000 mg/m2 | Freq: Once | INTRAVENOUS | Status: AC
  Administered 2021-05-28: 75 mg via INTRAVENOUS
  Filled 2021-05-28: qty 15

## 2021-05-28 MED ORDER — ROMIPLOSTIM 125 MCG ~~LOC~~ SOLR
2.0000 ug/kg | Freq: Once | SUBCUTANEOUS | Status: AC
Start: 2021-05-28 — End: 2021-05-28
  Administered 2021-05-28: 125 ug via SUBCUTANEOUS
  Filled 2021-05-28: qty 0.25

## 2021-05-28 MED ORDER — SODIUM CHLORIDE 0.9 % IV SOLN
500.0000 mg/m2 | Freq: Once | INTRAVENOUS | Status: AC
  Administered 2021-05-28: 874 mg via INTRAVENOUS
  Filled 2021-05-28: qty 22.99

## 2021-05-28 MED ORDER — HEPARIN SOD (PORK) LOCK FLUSH 100 UNIT/ML IV SOLN
500.0000 [IU] | Freq: Once | INTRAVENOUS | Status: AC | PRN
  Administered 2021-05-28: 500 [IU]
  Filled 2021-05-28: qty 5

## 2021-05-28 MED ORDER — PROCHLORPERAZINE MALEATE 10 MG PO TABS
10.0000 mg | ORAL_TABLET | Freq: Once | ORAL | Status: AC
  Administered 2021-05-28: 10 mg via ORAL
  Filled 2021-05-28: qty 1

## 2021-05-28 MED ORDER — SODIUM CHLORIDE 0.9 % IV SOLN
Freq: Once | INTRAVENOUS | Status: AC
Start: 2021-05-28 — End: 2021-05-28
  Filled 2021-05-28: qty 250

## 2021-05-28 NOTE — Patient Instructions (Signed)
[facility]  Discharge Instructions: Thank you for choosing [facility] to provide your oncology and hematology care.   If you have a lab appointment with the [facility], please go directly to the [facility] and check in at the registration area.   Wear comfortable clothing and clothing appropriate for easy access to any Portacath or PICC line.   We strive to give you quality time with your provider. You may need to reschedule your appointment if you arrive late (15 or more minutes).  Arriving late affects you and other patients whose appointments are after yours.  Also, if you miss three or more appointments without notifying the office, you may be dismissed from the clinic at the provider's discretion.      For prescription refill requests, have your pharmacy contact our office and allow 72 hours for refills to be completed.    Today you received the following chemotherapy and/or immunotherapy agents Abraxane, Gemzar, and Nplate      To help prevent nausea and vomiting after your treatment, we encourage you to take your nausea medication as directed.  BELOW ARE SYMPTOMS THAT SHOULD BE REPORTED IMMEDIATELY: *FEVER GREATER THAN 100.4 F (38 C) OR HIGHER *CHILLS OR SWEATING *NAUSEA AND VOMITING THAT IS NOT CONTROLLED WITH YOUR NAUSEA MEDICATION *UNUSUAL SHORTNESS OF BREATH *UNUSUAL BRUISING OR BLEEDING *URINARY PROBLEMS (pain or burning when urinating, or frequent urination) *BOWEL PROBLEMS (unusual diarrhea, constipation, pain near the anus) TENDERNESS IN MOUTH AND THROAT WITH OR WITHOUT PRESENCE OF ULCERS (sore throat, sores in mouth, or a toothache) UNUSUAL RASH, SWELLING OR PAIN  UNUSUAL VAGINAL DISCHARGE OR ITCHING   Items with * indicate a potential emergency and should be followed up as soon as possible or go to the Emergency Department if any problems should occur.  Please show the CHEMOTHERAPY ALERT CARD or IMMUNOTHERAPY ALERT CARD  at check-in to the Emergency Department and triage nurse.  Should you have questions after your visit or need to cancel or reschedule your appointment, please contact [facility]  Dept: [phone]  and follow the prompts.  Office hours are 8:00 a.m. to 4:30 p.m. Monday - Friday. Please note that voicemails left after 4:00 p.m. may not be returned until the following business day.  We are closed weekends and major holidays. You have access to a nurse at all times for urgent questions. Please call the main number to the clinic Dept: [phone] and follow the prompts.   For any non-urgent questions, you may also contact your provider using MyChart. We now offer e-Visits for anyone 56 and older to request care online for non-urgent symptoms. For details visit [email or web address].   Also download the MyChart app! Go to the app store, search "MyChart", open the app, select [facility], and log in with your MyChart username and password.  Due to Covid, a mask is required upon entering the hospital/clinic. If you do not have a mask, one will be given to you upon arrival. For doctor visits, patients may have 1 support person aged 26 or older with them. For treatment visits, patients cannot have anyone with them due to current Covid guidelines and our immunocompromised population.

## 2021-05-28 NOTE — Patient Instructions (Signed)
Implanted Port Home Guide An implanted port is a device that is placed under the skin. It is usually placed in the chest. The device can be used to give IV medicine, to take blood, or for dialysis. You may have an implanted port if: You need IV medicine that would be irritating to the small veins in your hands or arms. You need IV medicines, such as antibiotics, for a long period of time. You need IV nutrition for a long period of time. You need dialysis. When you have a port, your health care provider can choose to use the port instead of veins in your arms for these procedures. You may have fewer limitations when using a port than you would if you used other types of long-term IVs, and you will likely be able to return to normal activities afteryour incision heals. An implanted port has two main parts: Reservoir. The reservoir is the part where a needle is inserted to give medicines or draw blood. The reservoir is round. After it is placed, it appears as a small, raised area under your skin. Catheter. The catheter is a thin, flexible tube that connects the reservoir to a vein. Medicine that is inserted into the reservoir goes into the catheter and then into the vein. How is my port accessed? To access your port: A numbing cream may be placed on the skin over the port site. Your health care provider will put on a mask and sterile gloves. The skin over your port will be cleaned carefully with a germ-killing soap and allowed to dry. Your health care provider will gently pinch the port and insert a needle into it. Your health care provider will check for a blood return to make sure the port is in the vein and is not clogged. If your port needs to remain accessed to get medicine continuously (constant infusion), your health care provider will place a clear bandage (dressing) over the needle site. The dressing and needle will need to be changed every week, or as told by your health care provider. What  is flushing? Flushing helps keep the port from getting clogged. Follow instructions from your health care provider about how and when to flush the port. Ports are usually flushed with saline solution or a medicine called heparin. The need for flushing will depend on how the port is used: If the port is only used from time to time to give medicines or draw blood, the port may need to be flushed: Before and after medicines have been given. Before and after blood has been drawn. As part of routine maintenance. Flushing may be recommended every 4-6 weeks. If a constant infusion is running, the port may not need to be flushed. Throw away any syringes in a disposal container that is meant for sharp items (sharps container). You can buy a sharps container from a pharmacy, or you can make one by using an empty hard plastic bottle with a cover. How long will my port stay implanted? The port can stay in for as long as your health care provider thinks it is needed. When it is time for the port to come out, a surgery will be done to remove it. The surgery will be similar to the procedure that was done to putthe port in. Follow these instructions at home:  Flush your port as told by your health care provider. If you need an infusion over several days, follow instructions from your health care provider about how to take   care of your port site. Make sure you: Wash your hands with soap and water before you change your dressing. If soap and water are not available, use alcohol-based hand sanitizer. Change your dressing as told by your health care provider. Place any used dressings or infusion bags into a plastic bag. Throw that bag in the trash. Keep the dressing that covers the needle clean and dry. Do not get it wet. Do not use scissors or sharp objects near the tube. Keep the tube clamped, unless it is being used. Check your port site every day for signs of infection. Check for: Redness, swelling, or  pain. Fluid or blood. Pus or a bad smell. Protect the skin around the port site. Avoid wearing bra straps that rub or irritate the site. Protect the skin around your port from seat belts. Place a soft pad over your chest if needed. Bathe or shower as told by your health care provider. The site may get wet as long as you are not actively receiving an infusion. Return to your normal activities as told by your health care provider. Ask your health care provider what activities are safe for you. Carry a medical alert card or wear a medical alert bracelet at all times. This will let health care providers know that you have an implanted port in case of an emergency. Get help right away if: You have redness, swelling, or pain at the port site. You have fluid or blood coming from your port site. You have pus or a bad smell coming from the port site. You have a fever. Summary Implanted ports are usually placed in the chest for long-term IV access. Follow instructions from your health care provider about flushing the port and changing bandages (dressings). Take care of the area around your port by avoiding clothing that puts pressure on the area, and by watching for signs of infection. Protect the skin around your port from seat belts. Place a soft pad over your chest if needed. Get help right away if you have a fever or you have redness, swelling, pain, drainage, or a bad smell at the port site. This information is not intended to replace advice given to you by your health care provider. Make sure you discuss any questions you have with your healthcare provider. Document Revised: 03/17/2020 Document Reviewed: 03/17/2020 Elsevier Patient Education  2022 [employer]  

## 2021-05-28 NOTE — Progress Notes (Signed)
Ok to treat with platelets 89. Pt to receive Nplate today as well as next week per Dr. Benay Spice in supportive therapy plan.

## 2021-05-28 NOTE — Progress Notes (Signed)
[facility] OFFICE PROGRESS NOTE   Diagnosis: Pancreas cancer  INTERVAL HISTORY:   Aaron Johnston returns as scheduled.  He is here today with Aaron Johnston.  He completed another treatment with gemcitabine/Abraxane on 05/15/2021.  He continues weekly Nplate.  No pain.  He reports feeling well.  No neuropathy symptoms.  Diarrhea has partially improved.  Objective:  Vital signs in last 24 hours:  Blood pressure (!) 109/59, pulse 69, temperature 98.2 F (36.8 C), temperature source Oral, resp. rate 18, weight 135 lb 12.8 oz (61.6 kg).    HEENT: No thrush or ulcers Resp: Lungs clear bilaterally Cardio: Regular rate and rhythm GI: Nontender, no hepatosplenomegaly Vascular: No leg edema    Portacath/PICC-without erythema  Lab Results:  Lab Results  Component Value Date   WBC 16.0 (H) 05/28/2021   HGB 8.3 (L) 05/28/2021   HCT 25.8 (L) 05/28/2021   MCV 96.6 05/28/2021   PLT 89 (L) 05/28/2021   NEUTROABS 14.0 (H) 05/28/2021    CMP  Lab Results  Component Value Date   NA 141 05/28/2021   K 3.8 05/28/2021   CL 115 (H) 05/28/2021   CO2 17 (L) 05/28/2021   GLUCOSE 128 (H) 05/28/2021   BUN 20 05/28/2021   CREATININE 1.37 (H) 05/28/2021   CALCIUM 8.5 (L) 05/28/2021   PROT 6.4 (L) 05/28/2021   ALBUMIN 3.9 05/28/2021   AST 29 05/28/2021   ALT 38 05/28/2021   ALKPHOS 249 (H) 05/28/2021   BILITOT 0.7 05/28/2021   GFRNONAA 55 (L) 05/28/2021   GFRAA NOT CALCULATED 02/23/2021    Lab Results  Component Value Date   MBE675 2,404 (H) 05/22/2021    No results found for: INR, LABPROT  Imaging:  MR LIVER W WO CONTRAST  Result Date: 05/27/2021 CLINICAL DATA:  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 CONTRAST:  29m GADAVIST GADOBUTROL 1 MMOL/ML IV SOLN COMPARISON:  6 cc  Gadavist FINDINGS: Lower chest: Interstitial accentuation at the lung bases with questionable nodularity at the left lung base with a subpleural nodule measuring about 0.4 cm in diameter on image 1 series 2 mild scarring or atelectasis in the posterior basal segment right lower lobe. Hepatobiliary: Scattered primarily hypoenhancing lesions in the liver particularly along the dome of the liver and posteriorly in the right hepatic lobe. The dominant lesion is in segment 4b of the liver on image 52 series 17 and demonstrates a rind of arterial phase enhancement and some internal nodular enhancement, measuring 2.4 by 2.5 cm on image 52 of series 17, previously 2.0 by 2.0 cm on 02/19/2021, indicating enlargement. A lesion posteriorly in the right hepatic lobe on 36 of series 25 has somewhat indistinct margins but measures 1.1 cm transverse, previously 0.9 cm. 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 Attenuated common bile duct but without proximal biliary dilatation. Pancreas: Abnormal hypoenhancement in the body of the pancreas with infiltrative process surrounding the superior mesenteric artery and extending up along the celiac trunk and its proximal branches. The lesion is difficult to measure given its indistinct margins and infiltrative appearance but appears roughly similar to prior. There is some involvement of the pancreatic head as well. Spleen: Splenomegaly, spleen measures 10.7 by 8.3 by 14.2 cm (volume =  660 cm^3) no significant focal splenic lesion identified. Adrenals/Urinary Tract: No discrete adrenal lesion, there is extensive collateral vascularity around the left adrenal gland. Right extrarenal pelvis. Mild left hydronephrosis terminating at the UPJ appears new compared to the previous exam. Right kidney upper pole cyst. Stomach/Bowel: Extensive varices along the margins  of the stomach. Prominent stool throughout the colon favors constipation. Vascular/Lymphatic: As noted above, there is tumor surrounding the celiac trunk and SMA. The infiltrative pancreatic mass occludes the splenic vein. Extensive collateral vasculature noted along with cavernous transformation of the portal vein. Splenorenal shunting. Other: Mild perihepatic ascites and upper abdominal mesenteric edema. Musculoskeletal: Unremarkable IMPRESSION: 1. Mild enlargement of the dominant hepatic metastatic lesions although with the exception of the lesion in segment 4b of the liver most of the lesions are relatively indistinct. 2. Roughly stable appearance of the infiltrative mass of the pancreatic body extending around the celiac trunk and SMA. 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 3. 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 4. Stable splenomegaly. 5.  Prominent stool throughout the colon favors constipation. 6. Mild upper abdominal ascites and mesenteric edema. 7. Interstitial accentuation at the lung bases with some minimal subpleural nodularity in the left lower lobe. Electronically Signed   By: Aaron Johnston M.D.   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 CONTRAST:  55m GADAVIST GADOBUTROL 1 MMOL/ML IV SOLN COMPARISON:  6 cc Gadavist FINDINGS: Lower chest: Interstitial accentuation at the lung bases with questionable nodularity at the left lung base with a subpleural nodule measuring about 0.4 cm in diameter on image 1 series 2 mild scarring or atelectasis in the posterior basal segment right lower lobe. Hepatobiliary: Scattered primarily hypoenhancing lesions  in the liver particularly along the dome of the liver and posteriorly in the right hepatic lobe. The dominant lesion is in segment 4b of the liver on image 52 series 17 and demonstrates a rind of arterial phase enhancement and some internal nodular enhancement, measuring 2.4 by 2.5 cm on image 52 of series 17, previously 2.0 by 2.0 cm on 02/19/2021, indicating enlargement. A lesion posteriorly in the right hepatic lobe on 36 of series 25 has somewhat indistinct margins but measures 1.1 cm transverse, previously 0.9 cm. 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 Attenuated common bile duct but without proximal biliary dilatation. Pancreas: Abnormal hypoenhancement in the body of the pancreas with infiltrative process surrounding the superior mesenteric artery and extending up along the celiac trunk and its proximal branches. The lesion is difficult to measure given its indistinct margins and infiltrative appearance but appears roughly similar to prior. There is some involvement of the pancreatic head as well. Spleen: Splenomegaly, spleen measures 10.7 by 8.3 by 14.2 cm (volume = 660 cm^3) no significant focal splenic lesion identified. Adrenals/Urinary Tract: No discrete adrenal lesion, there is extensive collateral vascularity around the left adrenal gland. Right extrarenal pelvis. Mild left hydronephrosis terminating at the UPJ appears new compared to the previous exam. Right kidney upper pole cyst. Stomach/Bowel: Extensive varices along the margins of the stomach. Prominent stool throughout the colon favors constipation. Vascular/Lymphatic:  As noted above, there is tumor surrounding the celiac trunk and SMA. The infiltrative pancreatic mass occludes the splenic vein. Extensive collateral vasculature noted along with cavernous transformation of the portal vein. Splenorenal shunting.  Other: Mild perihepatic ascites and upper abdominal mesenteric edema. Musculoskeletal: Unremarkable IMPRESSION: 1. Mild enlargement of the dominant hepatic metastatic lesions although with the exception of the lesion in segment 4b of the liver most of the lesions are relatively indistinct. 2. Roughly stable appearance of the infiltrative mass of the pancreatic body extending around the celiac trunk and SMA. 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 3. 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 4. Stable splenomegaly. 5.  Prominent stool throughout the colon favors constipation. 6. Mild upper abdominal ascites and mesenteric edema. 7. Interstitial accentuation at the lung bases with some minimal subpleural nodularity in the left lower lobe. Electronically Signed   By: Aaron Johnston M.D.   On: 05/27/2021 10:39    Medications: I have reviewed the patient's current medications.   Assessment/Plan:  Pancreas cancer CT urology center 03/04/2020-haziness of the fat adjacent to the celiac axis and SMA with focal narrowing of the SMV, splenic vein, and splenoportal confluence CT 07/07/2020-infiltrative retroperitoneal mass with encasement of the celiac axis and SMA, high-grade narrowing of the proximal portal vein with cavernous transformation, nonocclusive thrombus within the main portal vein, mild biliary ductal dilatation, diffuse hypodense/hypoenhancing areas within the liver (edema versus infiltrating neoplasm), 2 x 1.7 cm area of focal prominence in the pancreas EUS 07/10/2020-no pancreas mass identified, 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, 1.5 cm aortocaval node biopsy-scant lymphoid material, no malignancy Diagnostic laparoscopy 07/17/2020-segment 3 and 4 liver nodules, adenocarcinoma, positive for pankeratin, CK7 and CK20, partially positive for TTF-1, negative for CDX2 Foundation 1-microsatellite stable, tumor mutation burden  4, K-ras G12D, subclonal RB1 alteration Elevated CA 19-9 Cycle 1 FOLFOX 07/28/2020 Cycle 2 FOLFOX 08/18/2020, oxaliplatin dose reduced due to thrombocytopenia Cycle 3 FOLFOX 09/08/2020 Cycle 4 FOLFOX 09/29/2020  Cycle 5 FOLFOX 10/20/2020 CTs 11/06/2020-grossly stable infiltrative mass within the pancreatic head and porta hepatis.  New small low-density liver lesions.  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 Cycle 6 FOLFOX 11/10/2021  Cycle 7 FOLFOX 12/01/2020 MRI liver 12/04/2020-no significant change in posttreatment appearance of the pancreatic head.  Numerous intrinsically low signal hypoenhancing lesions of the liver parenchyma predominantly observed in the right lobe of the liver and liver dome, measuring no greater than 8 mm and as seen on recent prior CT of the abdomen/pelvis. Cycle 8 FOLFOX 12/22/2020 Cycle 9 FOLFOX 01/12/2021 Cycle 10 FOLFOX 02/02/2021 MRI liver 02/19/2021-no change in pancreas head mass, multiple hypoenhancing liver lesions are new and increased in size, effacement of portal vein by pancreas head mass with cavernous transformation, no change in mild splenomegaly Cycle 1 gemcitabine/Abraxane 03/12/2021 Cycle 2 gemcitabine/Abraxane 03/27/2021- dose reductions secondary to neutropenia Cycle 3 gemcitabine/Abraxane 04/17/2021-dose reductions due to thrombocytopenia, white cell growth factor support added Cycle 4 gemcitabine/Abraxane 05/01/2021, Ziextenzo Cycle 5 gemcitabine/Abraxane 05/15/2021, Ziextenzo MRI abdomen 05/26/2021-mild enlargement of dominant hepatic metastases, appearance of infiltrative pancreas mass extending to the celiac trunk and SMA, splenic vein occlusion, cavernous transformation of the portal vein with upper abdomen collaterals, splenomegaly, mild upper abdominal ascites and mesenteric edema, minimal subpleural nodularity left lower lobe Cycle 6  gemcitabine/Abraxane 05/28/2021   Chronic thrombocytopenia- weekly Nplate beginning 3/84/6659 Diabetes Hypertension  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  He is completed 5 treatments with gemcitabine/Abraxane.  I reviewed the MRI findings and images with him.  2 measurable liver lesions are slightly increased in size compared to the baseline MRI.  There are no new lesions and other lesions are less apparent on the current study.  There are no new sites of metastatic disease.  The CA 19-9 was slightly lower 2 weeks ago.  He understands treatment options beyond gemcitabine/Abraxane are limited.  I recommend continuing the current regimen.  He agrees.  I will present Aaron case at the GI tumor conference next week for further review of the MRI.  Aaron Johnston will return for an office visit and chemotherapy in 2 weeks.  He will continue G-CSF support and weekly Nplate.  Aaron Coder, MD  05/28/2021  10:54 AM

## 2021-05-29 ENCOUNTER — Other Ambulatory Visit: Payer: Self-pay | Admitting: *Deleted

## 2021-05-29 ENCOUNTER — Inpatient Hospital Stay: Payer: Medicare Other

## 2021-05-29 ENCOUNTER — Inpatient Hospital Stay: Payer: Medicare Other | Admitting: Oncology

## 2021-05-29 VITALS — BP 114/56 | HR 83 | Temp 98.2°F | Resp 20

## 2021-05-29 DIAGNOSIS — C250 Malignant neoplasm of head of pancreas: Secondary | ICD-10-CM

## 2021-05-29 DIAGNOSIS — Z5111 Encounter for antineoplastic chemotherapy: Secondary | ICD-10-CM | POA: Diagnosis not present

## 2021-05-29 MED ORDER — DIPHENOXYLATE-ATROPINE 2.5-0.025 MG PO TABS: 1.0000 | ORAL_TABLET | Freq: Four times a day (QID) | ORAL | 0 refills | Status: DC | PRN

## 2021-05-29 MED ORDER — PEGFILGRASTIM-BMEZ 6 MG/0.6ML ~~LOC~~ SOSY
6.0000 mg | PREFILLED_SYRINGE | Freq: Once | SUBCUTANEOUS | Status: AC
Start: 2021-05-29 — End: 2021-05-29
  Administered 2021-05-29: 6 mg via SUBCUTANEOUS

## 2021-05-29 NOTE — Patient Instructions (Signed)

## 2021-05-29 NOTE — Telephone Encounter (Signed)
Refill

## 2021-05-30 ENCOUNTER — Ambulatory Visit: Payer: Medicare Other

## 2021-06-03 ENCOUNTER — Other Ambulatory Visit: Payer: Self-pay

## 2021-06-03 NOTE — Progress Notes (Signed)
The proposed treatment discussed in conference is for discussion purpose only and is not a binding recommendation.  The patients have not been physically examined, or presented with their treatment options.  Therefore, final treatment plans cannot be decided.  

## 2021-06-05 ENCOUNTER — Other Ambulatory Visit: Payer: Self-pay

## 2021-06-05 ENCOUNTER — Inpatient Hospital Stay: Payer: Medicare Other

## 2021-06-05 VITALS — BP 122/64 | HR 76 | Temp 97.6°F | Resp 18

## 2021-06-05 DIAGNOSIS — Z5111 Encounter for antineoplastic chemotherapy: Secondary | ICD-10-CM | POA: Diagnosis not present

## 2021-06-05 DIAGNOSIS — Z95828 Presence of other vascular implants and grafts: Secondary | ICD-10-CM

## 2021-06-05 DIAGNOSIS — C250 Malignant neoplasm of head of pancreas: Secondary | ICD-10-CM

## 2021-06-05 MED ORDER — ROMIPLOSTIM 125 MCG ~~LOC~~ SOLR
2.0000 ug/kg | Freq: Once | SUBCUTANEOUS | Status: AC
Start: 2021-06-05 — End: 2021-06-05
  Administered 2021-06-05: 125 ug via SUBCUTANEOUS
  Filled 2021-06-05: qty 0.25

## 2021-06-05 NOTE — Patient Instructions (Signed)
Romiplostim injection What is this medication? ROMIPLOSTIM (roe mi PLOE stim) helps your body make more platelets. This medicine is used to treat low platelets caused by chronic idiopathic thrombocytopenic purpura (ITP) or a bone marrow syndrome caused by radiation sickness. This medicine may be used for other purposes; ask your health care provider or pharmacist if you have questions. COMMON BRAND NAME(S): Nplate What should I tell my care team before I take this medication? They need to know if you have any of these conditions: blood clots myelodysplastic syndrome an unusual or allergic reaction to romiplostim, mannitol, other medicines, foods, dyes, or preservatives pregnant or trying to get pregnant breast-feeding How should I use this medication? This medicine is injected under the skin. It is given by a health care provider in a hospital or clinic setting. A special MedGuide will be given to you before each treatment. Be sure to read this information carefully each time. Talk to your health care provider about the use of this medicine in children. While it may be prescribed for children as young as newborns for selected conditions, precautions do apply. Overdosage: If you think you have taken too much of this medicine contact a poison control center or emergency room at once. NOTE: This medicine is only for you. Do not share this medicine with others. What if I miss a dose? Keep appointments for follow-up doses. It is important not to miss your dose. Call your health care provider if you are unable to keep an appointment. What may interact with this medication? Interactions are not expected. This list may not describe all possible interactions. Give your health care provider a list of all the medicines, herbs, non-prescription drugs, or dietary supplements you use. Also tell them if you smoke, drink alcohol, or use illegal drugs. Some items may interact with your medicine. What should I  watch for while using this medication? Visit your health care provider for regular checks on your progress. You may need blood work done while you are taking this medicine. Your condition will be monitored carefully while you are receiving this medicine. It is important not to miss any appointments. What side effects may I notice from receiving this medication? Side effects that you should report to your doctor or health care professional as soon as possible: allergic reactions (skin rash, itching or hives; swelling of the face, lips, or tongue) bleeding (bloody or black, tarry stools; red or dark brown urine; spitting up blood or brown material that looks like coffee grounds; red spots on the skin; unusual bruising or bleeding from the eyes, gums, or nose) blood clot (chest pain; shortness of breath; pain, swelling, or warmth in the leg) stroke (changes in vision; confusion; trouble speaking or understanding; severe headaches; sudden numbness or weakness of the face, arm or leg; trouble walking; dizziness; loss of balance or coordination) Side effects that usually do not require medical attention (report to your doctor or health care professional if they continue or are bothersome): diarrhea dizziness headache joint pain muscle pain stomach pain trouble sleeping This list may not describe all possible side effects. Call your doctor for medical advice about side effects. You may report side effects to FDA at 1-800-FDA-1088. Where should I keep my medication? This medicine is given in a hospital or clinic. It will not be stored at home. NOTE: This sheet is a summary. It may not cover all possible information. If you have questions about this medicine, talk to your doctor, pharmacist, or health care provider.    2022 Elsevier/Gold Standard (2019-12-17 10:28:13)  

## 2021-06-06 ENCOUNTER — Other Ambulatory Visit: Payer: Self-pay | Admitting: Oncology

## 2021-06-09 ENCOUNTER — Encounter: Payer: Self-pay | Admitting: Nurse Practitioner

## 2021-06-09 ENCOUNTER — Telehealth: Payer: Self-pay

## 2021-06-09 ENCOUNTER — Encounter: Payer: Self-pay | Admitting: Oncology

## 2021-06-09 NOTE — Telephone Encounter (Signed)
Called spoke with this patient made an appt for tomorrow 06/10/21 at [facility] for labs prior to dental appt to assess need for ABT prophylactic tx for dental procedure also called dental office to make them aware and they state they will also call to reschedule and/or adjust appt according to lab results.   Aaron Johnston

## 2021-06-10 ENCOUNTER — Other Ambulatory Visit: Payer: Self-pay | Admitting: *Deleted

## 2021-06-10 ENCOUNTER — Other Ambulatory Visit: Payer: Self-pay

## 2021-06-10 ENCOUNTER — Inpatient Hospital Stay: Payer: Medicare Other

## 2021-06-10 DIAGNOSIS — C250 Malignant neoplasm of head of pancreas: Secondary | ICD-10-CM

## 2021-06-10 DIAGNOSIS — Z95828 Presence of other vascular implants and grafts: Secondary | ICD-10-CM

## 2021-06-10 DIAGNOSIS — Z5111 Encounter for antineoplastic chemotherapy: Secondary | ICD-10-CM | POA: Diagnosis not present

## 2021-06-10 LAB — CBC WITH DIFFERENTIAL (CANCER CENTER ONLY)
Abs Immature Granulocytes: 0.32 10*3/uL — ABNORMAL HIGH (ref 0.00–0.07)
Basophils Absolute: 0.1 10*3/uL (ref 0.0–0.1)
Basophils Relative: 0 %
Eosinophils Absolute: 0.6 10*3/uL — ABNORMAL HIGH (ref 0.0–0.5)
Eosinophils Relative: 4 %
HCT: 24.5 % — ABNORMAL LOW (ref 39.0–52.0)
Hemoglobin: 7.7 g/dL — ABNORMAL LOW (ref 13.0–17.0)
Immature Granulocytes: 2 %
Lymphocytes Relative: 6 %
Lymphs Abs: 1 10*3/uL (ref 0.7–4.0)
MCH: 30.9 pg (ref 26.0–34.0)
MCHC: 31.4 g/dL (ref 30.0–36.0)
MCV: 98.4 fL (ref 80.0–100.0)
Monocytes Absolute: 0.7 10*3/uL (ref 0.1–1.0)
Monocytes Relative: 5 %
Neutro Abs: 13.1 10*3/uL — ABNORMAL HIGH (ref 1.7–7.7)
Neutrophils Relative %: 83 %
Platelet Count: 83 10*3/uL — ABNORMAL LOW (ref 150–400)
RBC: 2.49 MIL/uL — ABNORMAL LOW (ref 4.22–5.81)
RDW: 17 % — ABNORMAL HIGH (ref 11.5–15.5)
WBC Count: 15.7 10*3/uL — ABNORMAL HIGH (ref 4.0–10.5)
nRBC: 0 % (ref 0.0–0.2)

## 2021-06-10 MED ORDER — HEPARIN SOD (PORK) LOCK FLUSH 100 UNIT/ML IV SOLN
500.0000 [IU] | Freq: Once | INTRAVENOUS | Status: AC | PRN
  Administered 2021-06-10: 500 [IU]
  Filled 2021-06-10: qty 5

## 2021-06-10 MED ORDER — SODIUM CHLORIDE 0.9% FLUSH
10.0000 mL | INTRAVENOUS | Status: DC | PRN
  Administered 2021-06-10: 10 mL
  Filled 2021-06-10: qty 10

## 2021-06-10 NOTE — Patient Instructions (Signed)
Implanted Port Home Guide An implanted port is a device that is placed under the skin. It is usually placed in the chest. The device can be used to give IV medicine, to take blood, or for dialysis. You may have an implanted port if: You need IV medicine that would be irritating to the small veins in your hands or arms. You need IV medicines, such as antibiotics, for a long period of time. You need IV nutrition for a long period of time. You need dialysis. When you have a port, your health care provider can choose to use the port instead of veins in your arms for these procedures. You may have fewer limitations when using a port than you would if you used other types of long-term IVs, and you will likely be able to return to normal activities afteryour incision heals. An implanted port has two main parts: Reservoir. The reservoir is the part where a needle is inserted to give medicines or draw blood. The reservoir is round. After it is placed, it appears as a small, raised area under your skin. Catheter. The catheter is a thin, flexible tube that connects the reservoir to a vein. Medicine that is inserted into the reservoir goes into the catheter and then into the vein. How is my port accessed? To access your port: A numbing cream may be placed on the skin over the port site. Your health care provider will put on a mask and sterile gloves. The skin over your port will be cleaned carefully with a germ-killing soap and allowed to dry. Your health care provider will gently pinch the port and insert a needle into it. Your health care provider will check for a blood return to make sure the port is in the vein and is not clogged. If your port needs to remain accessed to get medicine continuously (constant infusion), your health care provider will place a clear bandage (dressing) over the needle site. The dressing and needle will need to be changed every week, or as told by your health care provider. What  is flushing? Flushing helps keep the port from getting clogged. Follow instructions from your health care provider about how and when to flush the port. Ports are usually flushed with saline solution or a medicine called heparin. The need for flushing will depend on how the port is used: If the port is only used from time to time to give medicines or draw blood, the port may need to be flushed: Before and after medicines have been given. Before and after blood has been drawn. As part of routine maintenance. Flushing may be recommended every 4-6 weeks. If a constant infusion is running, the port may not need to be flushed. Throw away any syringes in a disposal container that is meant for sharp items (sharps container). You can buy a sharps container from a pharmacy, or you can make one by using an empty hard plastic bottle with a cover. How long will my port stay implanted? The port can stay in for as long as your health care provider thinks it is needed. When it is time for the port to come out, a surgery will be done to remove it. The surgery will be similar to the procedure that was done to putthe port in. Follow these instructions at home:  Flush your port as told by your health care provider. If you need an infusion over several days, follow instructions from your health care provider about how to take   care of your port site. Make sure you: Wash your hands with soap and water before you change your dressing. If soap and water are not available, use alcohol-based hand sanitizer. Change your dressing as told by your health care provider. Place any used dressings or infusion bags into a plastic bag. Throw that bag in the trash. Keep the dressing that covers the needle clean and dry. Do not get it wet. Do not use scissors or sharp objects near the tube. Keep the tube clamped, unless it is being used. Check your port site every day for signs of infection. Check for: Redness, swelling, or  pain. Fluid or blood. Pus or a bad smell. Protect the skin around the port site. Avoid wearing bra straps that rub or irritate the site. Protect the skin around your port from seat belts. Place a soft pad over your chest if needed. Bathe or shower as told by your health care provider. The site may get wet as long as you are not actively receiving an infusion. Return to your normal activities as told by your health care provider. Ask your health care provider what activities are safe for you. Carry a medical alert card or wear a medical alert bracelet at all times. This will let health care providers know that you have an implanted port in case of an emergency. Get help right away if: You have redness, swelling, or pain at the port site. You have fluid or blood coming from your port site. You have pus or a bad smell coming from the port site. You have a fever. Summary Implanted ports are usually placed in the chest for long-term IV access. Follow instructions from your health care provider about flushing the port and changing bandages (dressings). Take care of the area around your port by avoiding clothing that puts pressure on the area, and by watching for signs of infection. Protect the skin around your port from seat belts. Place a soft pad over your chest if needed. Get help right away if you have a fever or you have redness, swelling, pain, drainage, or a bad smell at the port site. This information is not intended to replace advice given to you by your health care provider. Make sure you discuss any questions you have with your healthcare provider. Document Revised: 03/17/2020 Document Reviewed: 03/17/2020 Elsevier Patient Education  2022 [employer]  

## 2021-06-10 NOTE — Progress Notes (Unsigned)
Pt in office today completed labs reviewed with Dr. Benay Spice per provider pt is ok to go to dental no new orders given a copy of lab work sent with patient to the dentist

## 2021-06-12 ENCOUNTER — Inpatient Hospital Stay: Payer: Medicare Other

## 2021-06-12 ENCOUNTER — Inpatient Hospital Stay (HOSPITAL_BASED_OUTPATIENT_CLINIC_OR_DEPARTMENT_OTHER): Payer: Medicare Other | Admitting: Oncology

## 2021-06-12 ENCOUNTER — Other Ambulatory Visit: Payer: Self-pay

## 2021-06-12 VITALS — BP 116/67 | HR 70 | Temp 98.2°F | Resp 18 | Ht 69.0 in | Wt 133.4 lb

## 2021-06-12 DIAGNOSIS — Z95828 Presence of other vascular implants and grafts: Secondary | ICD-10-CM

## 2021-06-12 DIAGNOSIS — C250 Malignant neoplasm of head of pancreas: Secondary | ICD-10-CM

## 2021-06-12 DIAGNOSIS — Z5111 Encounter for antineoplastic chemotherapy: Secondary | ICD-10-CM | POA: Diagnosis not present

## 2021-06-12 LAB — CMP (CANCER CENTER ONLY)
ALT: 48 U/L — ABNORMAL HIGH (ref 0–44)
AST: 42 U/L — ABNORMAL HIGH (ref 15–41)
Albumin: 3.9 g/dL (ref 3.5–5.0)
Alkaline Phosphatase: 250 U/L — ABNORMAL HIGH (ref 38–126)
Anion gap: 9 (ref 5–15)
BUN: 18 mg/dL (ref 8–23)
CO2: 19 mmol/L — ABNORMAL LOW (ref 22–32)
Calcium: 8.4 mg/dL — ABNORMAL LOW (ref 8.9–10.3)
Chloride: 113 mmol/L — ABNORMAL HIGH (ref 98–111)
Creatinine: 1.38 mg/dL — ABNORMAL HIGH (ref 0.61–1.24)
GFR, Estimated: 54 mL/min — ABNORMAL LOW (ref >60)
Glucose, Bld: 178 mg/dL — ABNORMAL HIGH (ref 70–99)
Potassium: 4 mmol/L (ref 3.5–5.1)
Sodium: 141 mmol/L (ref 135–145)
Total Bilirubin: 0.6 mg/dL (ref 0.3–1.2)
Total Protein: 6.6 g/dL (ref 6.5–8.1)

## 2021-06-12 LAB — CBC WITH DIFFERENTIAL (CANCER CENTER ONLY)
Abs Immature Granulocytes: 0.35 10*3/uL — ABNORMAL HIGH (ref 0.00–0.07)
Basophils Absolute: 0.1 10*3/uL (ref 0.0–0.1)
Basophils Relative: 1 %
Eosinophils Absolute: 0.6 10*3/uL — ABNORMAL HIGH (ref 0.0–0.5)
Eosinophils Relative: 5 %
HCT: 24.4 % — ABNORMAL LOW (ref 39.0–52.0)
Hemoglobin: 7.7 g/dL — ABNORMAL LOW (ref 13.0–17.0)
Immature Granulocytes: 3 %
Lymphocytes Relative: 6 %
Lymphs Abs: 0.7 10*3/uL (ref 0.7–4.0)
MCH: 31.2 pg (ref 26.0–34.0)
MCHC: 31.6 g/dL (ref 30.0–36.0)
MCV: 98.8 fL (ref 80.0–100.0)
Monocytes Absolute: 0.6 10*3/uL (ref 0.1–1.0)
Monocytes Relative: 4 %
Neutro Abs: 10.4 10*3/uL — ABNORMAL HIGH (ref 1.7–7.7)
Neutrophils Relative %: 81 %
Platelet Count: 87 10*3/uL — ABNORMAL LOW (ref 150–400)
RBC: 2.47 MIL/uL — ABNORMAL LOW (ref 4.22–5.81)
RDW: 17.2 % — ABNORMAL HIGH (ref 11.5–15.5)
WBC Count: 12.7 10*3/uL — ABNORMAL HIGH (ref 4.0–10.5)
nRBC: 0 % (ref 0.0–0.2)

## 2021-06-12 LAB — PREPARE RBC (CROSSMATCH)

## 2021-06-12 LAB — SAMPLE TO BLOOD BANK

## 2021-06-12 MED ORDER — SODIUM CHLORIDE 0.9 % IV SOLN
Freq: Once | INTRAVENOUS | Status: AC
  Filled 2021-06-12: qty 250

## 2021-06-12 MED ORDER — SODIUM CHLORIDE 0.9% FLUSH
10.0000 mL | INTRAVENOUS | Status: DC | PRN
  Administered 2021-06-12: 10 mL
  Filled 2021-06-12: qty 10

## 2021-06-12 MED ORDER — SODIUM CHLORIDE 0.9 % IV SOLN
500.0000 mg/m2 | Freq: Once | INTRAVENOUS | Status: AC
  Administered 2021-06-12: 874 mg via INTRAVENOUS
  Filled 2021-06-12: qty 22.99

## 2021-06-12 MED ORDER — FAMOTIDINE 20 MG IN NS 100 ML IVPB
20.0000 mg | Freq: Once | INTRAVENOUS | Status: AC
  Administered 2021-06-12: 20 mg via INTRAVENOUS

## 2021-06-12 MED ORDER — HEPARIN SOD (PORK) LOCK FLUSH 100 UNIT/ML IV SOLN
500.0000 [IU] | Freq: Once | INTRAVENOUS | Status: AC | PRN
  Administered 2021-06-12: 500 [IU]
  Filled 2021-06-12: qty 5

## 2021-06-12 MED ORDER — PROCHLORPERAZINE MALEATE 10 MG PO TABS
10.0000 mg | ORAL_TABLET | Freq: Once | ORAL | Status: AC
  Administered 2021-06-12: 10 mg via ORAL
  Filled 2021-06-12: qty 1

## 2021-06-12 MED ORDER — ROMIPLOSTIM 125 MCG ~~LOC~~ SOLR
2.0000 ug/kg | Freq: Once | SUBCUTANEOUS | Status: AC
  Administered 2021-06-12: 120 ug via SUBCUTANEOUS
  Filled 2021-06-12: qty 0.24

## 2021-06-12 MED ORDER — PACLITAXEL PROTEIN-BOUND CHEMO INJECTION 100 MG
45.0000 mg/m2 | Freq: Once | INTRAVENOUS | Status: AC
  Administered 2021-06-12: 75 mg via INTRAVENOUS
  Filled 2021-06-12: qty 15

## 2021-06-12 NOTE — Addendum Note (Signed)
Addended by: Lenox Ponds E on: 06/12/2021 03:24 PM   Modules accepted: Orders, SmartSet

## 2021-06-12 NOTE — Patient Instructions (Addendum)
[facility]  Discharge Instructions: Thank you for choosing [facility] to provide your oncology and hematology care.   If you have a lab appointment with the [facility], please go directly to the [facility] and check in at the registration area.   Wear comfortable clothing and clothing appropriate for easy access to any Portacath or PICC line.   We strive to give you quality time with your provider. You may need to reschedule your appointment if you arrive late (15 or more minutes).  Arriving late affects you and other patients whose appointments are after yours.  Also, if you miss three or more appointments without notifying the office, you may be dismissed from the clinic at the provider's discretion.      For prescription refill requests, have your pharmacy contact our office and allow 72 hours for refills to be completed.    Today you received the following chemotherapy and/or immunotherapy agents paclitaxel protein bound, gemcitabine    To help prevent nausea and vomiting after your treatment, we encourage you to take your nausea medication as directed.  BELOW ARE SYMPTOMS THAT SHOULD BE REPORTED IMMEDIATELY: *FEVER GREATER THAN 100.4 F (38 C) OR HIGHER *CHILLS OR SWEATING *NAUSEA AND VOMITING THAT IS NOT CONTROLLED WITH YOUR NAUSEA MEDICATION *UNUSUAL SHORTNESS OF BREATH *UNUSUAL BRUISING OR BLEEDING *URINARY PROBLEMS (pain or burning when urinating, or frequent urination) *BOWEL PROBLEMS (unusual diarrhea, constipation, pain near the anus) TENDERNESS IN MOUTH AND THROAT WITH OR WITHOUT PRESENCE OF ULCERS (sore throat, sores in mouth, or a toothache) UNUSUAL RASH, SWELLING OR PAIN  UNUSUAL VAGINAL DISCHARGE OR ITCHING   Items with * indicate a potential emergency and should be followed up as soon as possible or go to the Emergency Department if any problems should occur.  Please show the CHEMOTHERAPY ALERT CARD or IMMUNOTHERAPY  ALERT CARD at check-in to the Emergency Department and triage nurse.  Should you have questions after your visit or need to cancel or reschedule your appointment, please contact [facility]  Dept: [phone]  and follow the prompts.  Office hours are 8:00 a.m. to 4:30 p.m. Monday - Friday. Please note that voicemails left after 4:00 p.m. may not be returned until the following business day.  We are closed weekends and major holidays. You have access to a nurse at all times for urgent questions. Please call the main number to the clinic Dept: [phone] and follow the prompts.   For any non-urgent questions, you may also contact your provider using MyChart. We now offer e-Visits for anyone 40 and older to request care online for non-urgent symptoms. For details visit [email or web address].   Also download the MyChart app! Go to the app store, search "MyChart", open the app, select [facility], and log in with your MyChart username and password.  Due to Covid, a mask is required upon entering the hospital/clinic. If you do not have a mask, one will be given to you upon arrival. For doctor visits, patients may have 1 support person aged 45 or older with them. For treatment visits, patients cannot have anyone with them due to current Covid guidelines and our immunocompromised population.   Nanoparticle Albumin-Bound Paclitaxel injection What is this medication? NANOPARTICLE ALBUMIN-BOUND PACLITAXEL (Na no PAHR ti kuhl al BYOO muhn-bound PAK li TAX el) is a chemotherapy drug. It targets fast dividing cells, like cancer cells, and causes these cells to die. This medicine is used to treatadvanced breast  cancer, lung cancer, and pancreatic cancer. This medicine may be used for other purposes; ask your health care provider orpharmacist if you have questions. COMMON BRAND NAME(S): Abraxane What should I tell my care team before I take this medication? They need to know if you  have any of these conditions: kidney disease liver disease low blood counts, like low white cell, platelet, or red cell counts lung or breathing disease, like asthma tingling of the fingers or toes, or other nerve disorder an unusual or allergic reaction to paclitaxel, albumin, other chemotherapy, other medicines, foods, dyes, or preservatives pregnant or trying to get pregnant breast-feeding How should I use this medication? This drug is given as an infusion into a vein. It is administered in a hospitalor clinic by a specially trained health care professional. Talk to your pediatrician regarding the use of this medicine in children.Special care may be needed. Overdosage: If you think you have taken too much of this medicine contact apoison control center or emergency room at once. NOTE: This medicine is only for you. Do not share this medicine with others. What if I miss a dose? It is important not to miss your dose. Call your doctor or health careprofessional if you are unable to keep an appointment. What may interact with this medication? This medicine may interact with the following medications: antiviral medicines for hepatitis, HIV or AIDS certain antibiotics like erythromycin and clarithromycin certain medicines for fungal infections like ketoconazole and itraconazole certain medicines for seizures like carbamazepine, phenobarbital, phenytoin gemfibrozil nefazodone rifampin St. John's wort This list may not describe all possible interactions. Give your health care provider a list of all the medicines, herbs, non-prescription drugs, or dietary supplements you use. Also tell them if you smoke, drink alcohol, or use illegaldrugs. Some items may interact with your medicine. What should I watch for while using this medication? Your condition will be monitored carefully while you are receiving this medicine. You will need important blood work done while you are taking thismedicine. This  medicine can cause serious allergic reactions. If you experience allergic reactions like skin rash, itching or hives, swelling of the face, lips, ortongue, tell your doctor or health care professional right away. In some cases, you may be given additional medicines to help with side effects.Follow all directions for their use. This drug may make you feel generally unwell. This is not uncommon, as chemotherapy can affect healthy cells as well as cancer cells. Report any side effects. Continue your course of treatment even though you feel ill unless yourdoctor tells you to stop. Call your doctor or health care professional for advice if you get a fever, chills or sore throat, or other symptoms of a cold or flu. Do not treat yourself. This drug decreases your body's ability to fight infections. Try toavoid being around people who are sick. This medicine may increase your risk to bruise or bleed. Call your doctor orhealth care professional if you notice any unusual bleeding. Be careful brushing and flossing your teeth or using a toothpick because you may get an infection or bleed more easily. If you have any dental work done,tell your dentist you are receiving this medicine. Avoid taking products that contain aspirin, acetaminophen, ibuprofen, naproxen, or ketoprofen unless instructed by your doctor. These medicines may hide afever. Do not become pregnant while taking this medicine or for 6 months after stopping it. Women should inform their doctor if they wish to become pregnant or think they might be pregnant. Men should not  father a child while taking this medicine or for 3 months after stopping it. There is a potential for serious side effects to an unborn child. Talk to your health care professionalor pharmacist for more information. Do not breast-feed an infant while taking this medicine or for 2 weeks afterstopping it. This medicine may interfere with the ability to get pregnant or to father a child. You  should talk to your doctor or health care professional if you areconcerned about your fertility. What side effects may I notice from receiving this medication? Side effects that you should report to your doctor or health care professionalas soon as possible: allergic reactions like skin rash, itching or hives, swelling of the face, lips, or tongue breathing problems changes in vision fast, irregular heartbeat low blood pressure mouth sores pain, tingling, numbness in the hands or feet signs of decreased platelets or bleeding - bruising, pinpoint red spots on the skin, black, tarry stools, blood in the urine signs of decreased red blood cells - unusually weak or tired, feeling faint or lightheaded, falls signs of infection - fever or chills, cough, sore throat, pain or difficulty passing urine signs and symptoms of liver injury like dark yellow or brown urine; general ill feeling or flu-like symptoms; light-colored stools; loss of appetite; nausea; right upper belly pain; unusually weak or tired; yellowing of the eyes or skin swelling of the ankles, feet, hands unusually slow heartbeat Side effects that usually do not require medical attention (report to yourdoctor or health care professional if they continue or are bothersome): diarrhea hair loss loss of appetite nausea, vomiting tiredness This list may not describe all possible side effects. Call your doctor for medical advice about side effects. You may report side effects to FDA at1-800-FDA-1088. Where should I keep my medication? This drug is given in a hospital or clinic and will not be stored at home. NOTE: This sheet is a summary. It may not cover all possible information. If you have questions about this medicine, talk to your doctor, pharmacist, orhealth care provider.  2022 Elsevier/Gold Standard (2017-07-05 13:03:45)  Gemcitabine injection What is this medication? GEMCITABINE (jem SYE ta been) is a chemotherapy drug. This  medicine is used to treat many types of cancer like breast cancer, lung cancer, pancreatic cancer,and ovarian cancer. This medicine may be used for other purposes; ask your health care provider orpharmacist if you have questions. COMMON BRAND NAME(S): Gemzar, Infugem What should I tell my care team before I take this medication? They need to know if you have any of these conditions: blood disorders infection kidney disease liver disease lung or breathing disease, like asthma recent or ongoing radiation therapy an unusual or allergic reaction to gemcitabine, other chemotherapy, other medicines, foods, dyes, or preservatives pregnant or trying to get pregnant breast-feeding How should I use this medication? This drug is given as an infusion into a vein. It is administered in a hospitalor clinic by a specially trained health care professional. Talk to your pediatrician regarding the use of this medicine in children.Special care may be needed. Overdosage: If you think you have taken too much of this medicine contact apoison control center or emergency room at once. NOTE: This medicine is only for you. Do not share this medicine with others. What if I miss a dose? It is important not to miss your dose. Call your doctor or health careprofessional if you are unable to keep an appointment. What may interact with this medication? medicines to increase blood counts like  filgrastim, pegfilgrastim, sargramostim some other chemotherapy drugs like cisplatin vaccines Talk to your doctor or health care professional before taking any of thesemedicines: acetaminophen aspirin ibuprofen ketoprofen naproxen This list may not describe all possible interactions. Give your health care provider a list of all the medicines, herbs, non-prescription drugs, or dietary supplements you use. Also tell them if you smoke, drink alcohol, or use illegaldrugs. Some items may interact with your medicine. What should I  watch for while using this medication? Visit your doctor for checks on your progress. This drug may make you feel generally unwell. This is not uncommon, as chemotherapy can affect healthy cells as well as cancer cells. Report any side effects. Continue your course oftreatment even though you feel ill unless your doctor tells you to stop. In some cases, you may be given additional medicines to help with side effects.Follow all directions for their use. Call your doctor or health care professional for advice if you get a fever, chills or sore throat, or other symptoms of a cold or flu. Do not treat yourself. This drug decreases your body's ability to fight infections. Try toavoid being around people who are sick. This medicine may increase your risk to bruise or bleed. Call your doctor orhealth care professional if you notice any unusual bleeding. Be careful brushing and flossing your teeth or using a toothpick because you may get an infection or bleed more easily. If you have any dental work done,tell your dentist you are receiving this medicine. Avoid taking products that contain aspirin, acetaminophen, ibuprofen, naproxen, or ketoprofen unless instructed by your doctor. These medicines may hide afever. Do not become pregnant while taking this medicine or for 6 months after stopping it. Women should inform their doctor if they wish to become pregnant or think they might be pregnant. Men should not father a child while taking this medicine and for 3 months after stopping it. There is a potential for serious side effects to an unborn child. Talk to your health care professional or pharmacist for more information. Do not breast-feed an infant while takingthis medicine or for at least 1 week after stopping it. Men should inform their doctors if they wish to father a child. This medicine may lower sperm counts. Talk with your doctor or health care professional ifyou are concerned about your fertility. What side  effects may I notice from receiving this medication? Side effects that you should report to your doctor or health care professionalas soon as possible: allergic reactions like skin rash, itching or hives, swelling of the face, lips, or tongue breathing problems pain, redness, or irritation at site where injected signs and symptoms of a dangerous change in heartbeat or heart rhythm like chest pain; dizziness; fast or irregular heartbeat; palpitations; feeling faint or lightheaded, falls; breathing problems signs of decreased platelets or bleeding - bruising, pinpoint red spots on the skin, black, tarry stools, blood in the urine signs of decreased red blood cells - unusually weak or tired, feeling faint or lightheaded, falls signs of infection - fever or chills, cough, sore throat, pain or difficulty passing urine signs and symptoms of kidney injury like trouble passing urine or change in the amount of urine signs and symptoms of liver injury like dark yellow or brown urine; general ill feeling or flu-like symptoms; light-colored stools; loss of appetite; nausea; right upper belly pain; unusually weak or tired; yellowing of the eyes or skin swelling of ankles, feet, hands Side effects that usually do not require medical attention (  report to yourdoctor or health care professional if they continue or are bothersome): constipation diarrhea hair loss loss of appetite nausea rash vomiting This list may not describe all possible side effects. Call your doctor for medical advice about side effects. You may report side effects to FDA at1-800-FDA-1088. Where should I keep my medication? This drug is given in a hospital or clinic and will not be stored at home. NOTE: This sheet is a summary. It may not cover all possible information. If you have questions about this medicine, talk to your doctor, pharmacist, orhealth care provider.  2022 Elsevier/Gold Standard (2018-01-25 18:06:11)  Romiplostim  injection What is this medication? ROMIPLOSTIM (roe mi PLOE stim) helps your body make more platelets. This medicine is used to treat low platelets caused by chronic idiopathic thrombocytopenic purpura (ITP) or a bone marrow syndrome caused by radiationsickness. This medicine may be used for other purposes; ask your health care provider orpharmacist if you have questions. COMMON BRAND NAME(S): Nplate What should I tell my care team before I take this medication? They need to know if you have any of these conditions: blood clots myelodysplastic syndrome an unusual or allergic reaction to romiplostim, mannitol, other medicines, foods, dyes, or preservatives pregnant or trying to get pregnant breast-feeding How should I use this medication? This medicine is injected under the skin. It is given by a health care providerin a hospital or clinic setting. A special MedGuide will be given to you before each treatment. Be sure to readthis information carefully each time. Talk to your health care provider about the use of this medicine in children. While it may be prescribed for children as young as newborns for selectedconditions, precautions do apply. Overdosage: If you think you have taken too much of this medicine contact apoison control center or emergency room at once. NOTE: This medicine is only for you. Do not share this medicine with others. What if I miss a dose? Keep appointments for follow-up doses. It is important not to miss your dose.Call your health care provider if you are unable to keep an appointment. What may interact with this medication? Interactions are not expected. This list may not describe all possible interactions. Give your health care provider a list of all the medicines, herbs, non-prescription drugs, or dietary supplements you use. Also tell them if you smoke, drink alcohol, or use illegaldrugs. Some items may interact with your medicine. What should I watch for while  using this medication? Visit your health care provider for regular checks on your progress. You may need blood work done while you are taking this medicine. Your condition will be monitored carefully while you are receiving this medicine. It is important notto miss any appointments. What side effects may I notice from receiving this medication? Side effects that you should report to your doctor or health care professionalas soon as possible: allergic reactions (skin rash, itching or hives; swelling of the face, lips, or tongue) bleeding (bloody or black, tarry stools; red or dark brown urine; spitting up blood or brown material that looks like coffee grounds; red spots on the skin; unusual bruising or bleeding from the eyes, gums, or nose) blood clot (chest pain; shortness of breath; pain, swelling, or warmth in the leg) stroke (changes in vision; confusion; trouble speaking or understanding; severe headaches; sudden numbness or weakness of the face, arm or leg; trouble walking; dizziness; loss of balance or coordination) Side effects that usually do not require medical attention (report to yourdoctor or health care  professional if they continue or are bothersome): diarrhea dizziness headache joint pain muscle pain stomach pain trouble sleeping This list may not describe all possible side effects. Call your doctor for medical advice about side effects. You may report side effects to FDA at1-800-FDA-1088. Where should I keep my medication? This medicine is given in a hospital or clinic. It will not be stored at home. NOTE: This sheet is a summary. It may not cover all possible information. If you have questions about this medicine, talk to your doctor, pharmacist, orhealth care provider.  2022 Elsevier/Gold Standard (2019-12-17 10:28:13)

## 2021-06-12 NOTE — Progress Notes (Signed)
[place] OFFICE PROGRESS NOTE   Diagnosis: Pancreas cancer  INTERVAL HISTORY:   Aaron Johnston returns as scheduled.  He completed another treatment with gemcitabine/Abraxane on 05/28/2021 no fever, rash, or neuropathy symptoms.  He feels well.  He is working in his business.  He continues to have 3-4 loose stools per day.  He reports his primary provider discontinued Januvia and simvastatin yesterday.  Objective:  Vital signs in last 24 hours:  Blood pressure 116/67, pulse 70, temperature 98.2 F (36.8 C), temperature source Oral, resp. rate 18, height '5\' 9"'  (1.753 m), weight 133 lb 6.4 oz (60.5 kg), SpO2 100 %.    HEENT: No thrush or ulcers Resp: Lungs clear bilaterally Cardio: Regular rate and rhythm GI: No mass, nontender, no hepatosplenomegaly Vascular: Trace pitting edema at the left greater than right lower leg and anklle  Skin: Palms without erythema  Portacath/PICC-without erythema  Lab Results:  Lab Results  Component Value Date   WBC 15.7 (H) 06/10/2021   HGB 7.7 (L) 06/10/2021   HCT 24.5 (L) 06/10/2021   MCV 98.4 06/10/2021   PLT 83 (L) 06/10/2021   NEUTROABS 13.1 (H) 06/10/2021    CMP  Lab Results  Component Value Date   NA 141 05/28/2021   K 3.8 05/28/2021   CL 115 (H) 05/28/2021   CO2 17 (L) 05/28/2021   GLUCOSE 128 (H) 05/28/2021   BUN 20 05/28/2021   CREATININE 1.37 (H) 05/28/2021   CALCIUM 8.5 (L) 05/28/2021   PROT 6.4 (L) 05/28/2021   ALBUMIN 3.9 05/28/2021   AST 29 05/28/2021   ALT 38 05/28/2021   ALKPHOS 249 (H) 05/28/2021   BILITOT 0.7 05/28/2021   GFRNONAA 55 (L) 05/28/2021   GFRAA NOT CALCULATED 02/23/2021    Lab Results  Component Value Date   AXK553 2,404 (H) 05/22/2021    Medications: I have reviewed the patient's current medications.   Assessment/Plan:  Pancreas cancer CT urology center 03/04/2020-haziness of the fat adjacent to the celiac axis and SMA with focal narrowing of the SMV, splenic vein, and  splenoportal confluence CT 07/07/2020-infiltrative retroperitoneal mass with encasement of the celiac axis and SMA, high-grade narrowing of the proximal portal vein with cavernous transformation, nonocclusive thrombus within the main portal vein, mild biliary ductal dilatation, diffuse hypodense/hypoenhancing areas within the liver (edema versus infiltrating neoplasm), 2 x 1.7 cm area of focal prominence in the pancreas EUS 07/10/2020-no pancreas mass identified, 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, 1.5 cm aortocaval node biopsy-scant lymphoid material, no malignancy Diagnostic laparoscopy 07/17/2020-segment 3 and 4 liver nodules, adenocarcinoma, positive for pankeratin, CK7 and CK20, partially positive for TTF-1, negative for CDX2 Foundation 1-microsatellite stable, tumor mutation burden 4, K-ras G12D, subclonal RB1 alteration Elevated CA 19-9 Cycle 1 FOLFOX 07/28/2020 Cycle 2 FOLFOX 08/18/2020, oxaliplatin dose reduced due to thrombocytopenia Cycle 3 FOLFOX 09/08/2020 Cycle 4 FOLFOX 09/29/2020  Cycle 5 FOLFOX 10/20/2020 CTs 11/06/2020-grossly stable infiltrative mass within the pancreatic head and porta hepatis.  New small low-density liver lesions.  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 Cycle 6 FOLFOX 11/10/2021  Cycle 7 FOLFOX 12/01/2020 MRI liver 12/04/2020-no significant change in posttreatment appearance of the pancreatic head.  Numerous intrinsically low signal hypoenhancing lesions of the liver parenchyma predominantly observed in the right lobe of the liver and liver dome, measuring no greater than 8 mm and as seen on recent prior CT of  the abdomen/pelvis. Cycle 8 FOLFOX 12/22/2020 Cycle 9 FOLFOX 01/12/2021 Cycle 10 FOLFOX 02/02/2021 MRI liver 02/19/2021-no change in pancreas head mass, multiple hypoenhancing liver lesions are new and increased in size,  effacement of portal vein by pancreas head mass with cavernous transformation, no change in mild splenomegaly Cycle 1 gemcitabine/Abraxane 03/12/2021 Cycle 2 gemcitabine/Abraxane 03/27/2021- dose reductions secondary to neutropenia Cycle 3 gemcitabine/Abraxane 04/17/2021-dose reductions due to thrombocytopenia, white cell growth factor support added Cycle 4 gemcitabine/Abraxane 05/01/2021, Ziextenzo Cycle 5 gemcitabine/Abraxane 05/15/2021, Ziextenzo MRI abdomen 05/26/2021-mild enlargement of dominant hepatic metastases, appearance of infiltrative pancreas mass extending to the celiac trunk and SMA, splenic vein occlusion, cavernous transformation of the portal vein with upper abdomen collaterals, splenomegaly, mild upper abdominal ascites and mesenteric edema, minimal subpleural nodularity left lower lobe Cycle 6 gemcitabine/Abraxane 05/28/2021 Cycle 7 gemcitabine/Abraxane 06/12/2021   Chronic thrombocytopenia- weekly Nplate beginning 12/15/8655 Diabetes Hypertension 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  He continues to tolerate the gemcitabine/Abraxane well.  He is being followed by GI and his primary provider for management of diarrhea.  The diarrhea is most likely unrelated to chemotherapy.  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  He will receive a red cell transfusion on 06/15/2021.  Aaron Johnston continues weekly Nplate and G-CSF support.  He will return for an office visit in the next cycle of chemotherapy in 2 weeks.  We will plan for a restaging MRI in September.  Betsy Coder, MD  06/12/2021  8:47 AM

## 2021-06-13 ENCOUNTER — Inpatient Hospital Stay: Payer: Medicare Other

## 2021-06-13 ENCOUNTER — Other Ambulatory Visit: Payer: Self-pay

## 2021-06-13 VITALS — BP 109/55 | HR 73 | Temp 98.3°F | Resp 18

## 2021-06-13 DIAGNOSIS — Z5111 Encounter for antineoplastic chemotherapy: Secondary | ICD-10-CM | POA: Diagnosis not present

## 2021-06-13 DIAGNOSIS — C250 Malignant neoplasm of head of pancreas: Secondary | ICD-10-CM

## 2021-06-13 LAB — CANCER ANTIGEN 19-9: CA 19-9: 1908 U/mL — ABNORMAL HIGH (ref 0–35)

## 2021-06-13 MED ORDER — PEGFILGRASTIM-BMEZ 6 MG/0.6ML ~~LOC~~ SOSY
6.0000 mg | PREFILLED_SYRINGE | Freq: Once | SUBCUTANEOUS | Status: AC
  Administered 2021-06-13: 6 mg via SUBCUTANEOUS

## 2021-06-13 MED ORDER — PEGFILGRASTIM-BMEZ 6 MG/0.6ML ~~LOC~~ SOSY
PREFILLED_SYRINGE | SUBCUTANEOUS | Status: AC
  Filled 2021-06-13: qty 0.6

## 2021-06-13 NOTE — Patient Instructions (Signed)

## 2021-06-15 ENCOUNTER — Inpatient Hospital Stay: Payer: Medicare Other | Attending: Genetic Counselor

## 2021-06-15 ENCOUNTER — Other Ambulatory Visit: Payer: Self-pay

## 2021-06-15 DIAGNOSIS — Z5111 Encounter for antineoplastic chemotherapy: Secondary | ICD-10-CM | POA: Insufficient documentation

## 2021-06-15 DIAGNOSIS — C787 Secondary malignant neoplasm of liver and intrahepatic bile duct: Secondary | ICD-10-CM | POA: Diagnosis present

## 2021-06-15 DIAGNOSIS — Z298 Encounter for other specified prophylactic measures: Secondary | ICD-10-CM | POA: Diagnosis not present

## 2021-06-15 DIAGNOSIS — T451X5A Adverse effect of antineoplastic and immunosuppressive drugs, initial encounter: Secondary | ICD-10-CM | POA: Insufficient documentation

## 2021-06-15 DIAGNOSIS — D701 Agranulocytosis secondary to cancer chemotherapy: Secondary | ICD-10-CM | POA: Diagnosis not present

## 2021-06-15 DIAGNOSIS — C250 Malignant neoplasm of head of pancreas: Secondary | ICD-10-CM | POA: Diagnosis present

## 2021-06-15 DIAGNOSIS — D6949 Other primary thrombocytopenia: Secondary | ICD-10-CM | POA: Insufficient documentation

## 2021-06-15 MED ORDER — SODIUM CHLORIDE 0.9% IV SOLUTION
250.0000 mL | Freq: Once | INTRAVENOUS | Status: AC
  Administered 2021-06-15: 250 mL via INTRAVENOUS
  Filled 2021-06-15: qty 250

## 2021-06-15 MED ORDER — HEPARIN SOD (PORK) LOCK FLUSH 100 UNIT/ML IV SOLN
500.0000 [IU] | Freq: Every day | INTRAVENOUS | Status: DC | PRN
  Filled 2021-06-15: qty 5

## 2021-06-15 MED ORDER — SODIUM CHLORIDE 0.9% FLUSH
10.0000 mL | INTRAVENOUS | Status: DC | PRN
  Filled 2021-06-15: qty 10

## 2021-06-15 NOTE — Patient Instructions (Signed)
Blood Transfusion, Adult, Care After This sheet gives you information about how to care for yourself after your procedure. Your doctor may also give you more specific instructions. If youhave problems or questions, contact your doctor. What can I expect after the procedure? After the procedure, it is common to have: Bruising and soreness at the IV site. A fever or chills on the day of the procedure. This may be your body's response to the new blood cells received. A headache. Follow these instructions at home: Insertion site care     Follow instructions from your doctor about how to take care of your insertion site. This is where an IV tube was put into your vein. Make sure you: Wash your hands with soap and water before and after you change your bandage (dressing). If you cannot use soap and water, use hand sanitizer. Change your bandage as told by your doctor. Check your insertion site every day for signs of infection. Check for: Redness, swelling, or pain. Bleeding from the site. Warmth. Pus or a bad smell. General instructions Take over-the-counter and prescription medicines only as told by your doctor. Rest as told by your doctor. Go back to your normal activities as told by your doctor. Keep all follow-up visits as told by your doctor. This is important. Contact a doctor if: You have itching or red, swollen areas of skin (hives). You feel worried or nervous (anxious). You feel weak after doing your normal activities. You have redness, swelling, warmth, or pain around the insertion site. You have blood coming from the insertion site, and the blood does not stop with pressure. You have pus or a bad smell coming from the insertion site. Get help right away if: You have signs of a serious reaction. This may be coming from an allergy or the body's defense system (immune system). Signs include: Trouble breathing or shortness of breath. Swelling of the face or feeling warm  (flushed). Fever or chills. Head, chest, or back pain. Dark pee (urine) or blood in the pee. Widespread rash. Fast heartbeat. Feeling dizzy or light-headed. You may receive your blood transfusion in an outpatient setting. If so, youwill be told whom to contact to report any reactions. These symptoms may be an emergency. Do not wait to see if the symptoms will go away. Get medical help right away. Call your local emergency services (911 in the [place] Do not drive yourself to the hospital. Summary Bruising and soreness at the IV site are common. Check your insertion site every day for signs of infection. Rest as told by your doctor. Go back to your normal activities as told by your doctor. Get help right away if you have signs of a serious reaction. This information is not intended to replace advice given to you by your health care provider. Make sure you discuss any questions you have with your healthcare provider. Document Revised: 04/26/2019 Document Reviewed: 04/26/2019 Elsevier Patient Education  2022 [employer]  

## 2021-06-16 LAB — TYPE AND SCREEN
ABO/RH(D): A POS
Antibody Screen: NEGATIVE
Unit division: 0
Unit division: 0

## 2021-06-16 LAB — BPAM RBC
Blood Product Expiration Date: 202208192359
Blood Product Expiration Date: 202208192359
ISSUE DATE / TIME: 202208010732
ISSUE DATE / TIME: 202208010732
Unit Type and Rh: 6200
Unit Type and Rh: 6200

## 2021-06-19 ENCOUNTER — Other Ambulatory Visit: Payer: Self-pay | Admitting: Oncology

## 2021-06-19 ENCOUNTER — Inpatient Hospital Stay: Payer: Medicare Other

## 2021-06-19 ENCOUNTER — Other Ambulatory Visit: Payer: Self-pay

## 2021-06-19 VITALS — BP 111/63 | HR 68 | Temp 98.2°F | Resp 20

## 2021-06-19 DIAGNOSIS — Z5111 Encounter for antineoplastic chemotherapy: Secondary | ICD-10-CM | POA: Diagnosis not present

## 2021-06-19 DIAGNOSIS — C250 Malignant neoplasm of head of pancreas: Secondary | ICD-10-CM

## 2021-06-19 DIAGNOSIS — Z95828 Presence of other vascular implants and grafts: Secondary | ICD-10-CM

## 2021-06-19 LAB — CBC WITH DIFFERENTIAL (CANCER CENTER ONLY)
Abs Immature Granulocytes: 0.4 10*3/uL — ABNORMAL HIGH (ref 0.00–0.07)
Basophils Absolute: 0.1 10*3/uL (ref 0.0–0.1)
Basophils Relative: 1 %
Eosinophils Absolute: 0.4 10*3/uL (ref 0.0–0.5)
Eosinophils Relative: 2 %
HCT: 28 % — ABNORMAL LOW (ref 39.0–52.0)
Hemoglobin: 9.1 g/dL — ABNORMAL LOW (ref 13.0–17.0)
Immature Granulocytes: 2 %
Lymphocytes Relative: 4 %
Lymphs Abs: 1 10*3/uL (ref 0.7–4.0)
MCH: 31.1 pg (ref 26.0–34.0)
MCHC: 32.5 g/dL (ref 30.0–36.0)
MCV: 95.6 fL (ref 80.0–100.0)
Monocytes Absolute: 1.2 10*3/uL — ABNORMAL HIGH (ref 0.1–1.0)
Monocytes Relative: 6 %
Neutro Abs: 19.1 10*3/uL — ABNORMAL HIGH (ref 1.7–7.7)
Neutrophils Relative %: 85 %
Platelet Count: 113 10*3/uL — ABNORMAL LOW (ref 150–400)
RBC: 2.93 MIL/uL — ABNORMAL LOW (ref 4.22–5.81)
RDW: 17.4 % — ABNORMAL HIGH (ref 11.5–15.5)
WBC Count: 22.2 10*3/uL — ABNORMAL HIGH (ref 4.0–10.5)
nRBC: 0 % (ref 0.0–0.2)

## 2021-06-19 MED ORDER — ROMIPLOSTIM 125 MCG ~~LOC~~ SOLR
2.0000 ug/kg | Freq: Once | SUBCUTANEOUS | Status: AC
  Administered 2021-06-19: 120 ug via SUBCUTANEOUS
  Filled 2021-06-19: qty 0.24

## 2021-06-19 NOTE — Patient Instructions (Signed)
Romiplostim injection What is this medication? ROMIPLOSTIM (roe mi PLOE stim) helps your body make more platelets. This medicine is used to treat low platelets caused by chronic idiopathic thrombocytopenic purpura (ITP) or a bone marrow syndrome caused by radiation sickness. This medicine may be used for other purposes; ask your health care provider or pharmacist if you have questions. COMMON BRAND NAME(S): Nplate What should I tell my care team before I take this medication? They need to know if you have any of these conditions: blood clots myelodysplastic syndrome an unusual or allergic reaction to romiplostim, mannitol, other medicines, foods, dyes, or preservatives pregnant or trying to get pregnant breast-feeding How should I use this medication? This medicine is injected under the skin. It is given by a health care provider in a hospital or clinic setting. A special MedGuide will be given to you before each treatment. Be sure to read this information carefully each time. Talk to your health care provider about the use of this medicine in children. While it may be prescribed for children as young as newborns for selected conditions, precautions do apply. Overdosage: If you think you have taken too much of this medicine contact a poison control center or emergency room at once. NOTE: This medicine is only for you. Do not share this medicine with others. What if I miss a dose? Keep appointments for follow-up doses. It is important not to miss your dose. Call your health care provider if you are unable to keep an appointment. What may interact with this medication? Interactions are not expected. This list may not describe all possible interactions. Give your health care provider a list of all the medicines, herbs, non-prescription drugs, or dietary supplements you use. Also tell them if you smoke, drink alcohol, or use illegal drugs. Some items may interact with your medicine. What should I  watch for while using this medication? Visit your health care provider for regular checks on your progress. You may need blood work done while you are taking this medicine. Your condition will be monitored carefully while you are receiving this medicine. It is important not to miss any appointments. What side effects may I notice from receiving this medication? Side effects that you should report to your doctor or health care professional as soon as possible: allergic reactions (skin rash, itching or hives; swelling of the face, lips, or tongue) bleeding (bloody or black, tarry stools; red or dark brown urine; spitting up blood or brown material that looks like coffee grounds; red spots on the skin; unusual bruising or bleeding from the eyes, gums, or nose) blood clot (chest pain; shortness of breath; pain, swelling, or warmth in the leg) stroke (changes in vision; confusion; trouble speaking or understanding; severe headaches; sudden numbness or weakness of the face, arm or leg; trouble walking; dizziness; loss of balance or coordination) Side effects that usually do not require medical attention (report to your doctor or health care professional if they continue or are bothersome): diarrhea dizziness headache joint pain muscle pain stomach pain trouble sleeping This list may not describe all possible side effects. Call your doctor for medical advice about side effects. You may report side effects to FDA at 1-800-FDA-1088. Where should I keep my medication? This medicine is given in a hospital or clinic. It will not be stored at home. NOTE: This sheet is a summary. It may not cover all possible information. If you have questions about this medicine, talk to your doctor, pharmacist, or health care provider.    2022 Elsevier/Gold Standard (2019-12-17 10:28:13)  

## 2021-06-21 ENCOUNTER — Other Ambulatory Visit: Payer: Self-pay | Admitting: Oncology

## 2021-06-26 ENCOUNTER — Other Ambulatory Visit: Payer: Self-pay

## 2021-06-26 ENCOUNTER — Inpatient Hospital Stay: Payer: Medicare Other

## 2021-06-26 ENCOUNTER — Encounter: Payer: Self-pay | Admitting: Nurse Practitioner

## 2021-06-26 ENCOUNTER — Inpatient Hospital Stay (HOSPITAL_BASED_OUTPATIENT_CLINIC_OR_DEPARTMENT_OTHER): Payer: Medicare Other | Admitting: Nurse Practitioner

## 2021-06-26 VITALS — BP 112/64 | HR 91 | Temp 97.8°F | Resp 18 | Ht 69.0 in | Wt 133.9 lb

## 2021-06-26 DIAGNOSIS — Z95828 Presence of other vascular implants and grafts: Secondary | ICD-10-CM

## 2021-06-26 DIAGNOSIS — C259 Malignant neoplasm of pancreas, unspecified: Secondary | ICD-10-CM

## 2021-06-26 DIAGNOSIS — C250 Malignant neoplasm of head of pancreas: Secondary | ICD-10-CM | POA: Diagnosis not present

## 2021-06-26 DIAGNOSIS — Z5111 Encounter for antineoplastic chemotherapy: Secondary | ICD-10-CM | POA: Diagnosis not present

## 2021-06-26 LAB — CMP (CANCER CENTER ONLY)
ALT: 51 U/L — ABNORMAL HIGH (ref 0–44)
AST: 41 U/L (ref 15–41)
Albumin: 3.7 g/dL (ref 3.5–5.0)
Alkaline Phosphatase: 231 U/L — ABNORMAL HIGH (ref 38–126)
Anion gap: 8 (ref 5–15)
BUN: 19 mg/dL (ref 8–23)
CO2: 19 mmol/L — ABNORMAL LOW (ref 22–32)
Calcium: 8.6 mg/dL — ABNORMAL LOW (ref 8.9–10.3)
Chloride: 114 mmol/L — ABNORMAL HIGH (ref 98–111)
Creatinine: 1.2 mg/dL (ref 0.61–1.24)
GFR, Estimated: >60 mL/min (ref >60)
Glucose, Bld: 170 mg/dL — ABNORMAL HIGH (ref 70–99)
Potassium: 4.1 mmol/L (ref 3.5–5.1)
Sodium: 141 mmol/L (ref 135–145)
Total Bilirubin: 0.8 mg/dL (ref 0.3–1.2)
Total Protein: 6.5 g/dL (ref 6.5–8.1)

## 2021-06-26 LAB — CBC WITH DIFFERENTIAL (CANCER CENTER ONLY)
Abs Immature Granulocytes: 0.17 10*3/uL — ABNORMAL HIGH (ref 0.00–0.07)
Basophils Absolute: 0.1 10*3/uL (ref 0.0–0.1)
Basophils Relative: 1 %
Eosinophils Absolute: 0.6 10*3/uL — ABNORMAL HIGH (ref 0.0–0.5)
Eosinophils Relative: 5 %
HCT: 29.9 % — ABNORMAL LOW (ref 39.0–52.0)
Hemoglobin: 9.5 g/dL — ABNORMAL LOW (ref 13.0–17.0)
Immature Granulocytes: 1 %
Lymphocytes Relative: 6 %
Lymphs Abs: 0.7 10*3/uL (ref 0.7–4.0)
MCH: 30.4 pg (ref 26.0–34.0)
MCHC: 31.8 g/dL (ref 30.0–36.0)
MCV: 95.8 fL (ref 80.0–100.0)
Monocytes Absolute: 0.5 10*3/uL (ref 0.1–1.0)
Monocytes Relative: 4 %
Neutro Abs: 9.9 10*3/uL — ABNORMAL HIGH (ref 1.7–7.7)
Neutrophils Relative %: 83 %
Platelet Count: 94 10*3/uL — ABNORMAL LOW (ref 150–400)
RBC: 3.12 MIL/uL — ABNORMAL LOW (ref 4.22–5.81)
RDW: 17.6 % — ABNORMAL HIGH (ref 11.5–15.5)
WBC Count: 11.8 10*3/uL — ABNORMAL HIGH (ref 4.0–10.5)
nRBC: 0 % (ref 0.0–0.2)

## 2021-06-26 MED ORDER — GEMCITABINE HCL CHEMO INJECTION 1 GM/26.3ML
500.0000 mg/m2 | Freq: Once | INTRAVENOUS | Status: AC
  Administered 2021-06-26: 874 mg via INTRAVENOUS
  Filled 2021-06-26: qty 22.99

## 2021-06-26 MED ORDER — SODIUM CHLORIDE 0.9 % IV SOLN
Freq: Once | INTRAVENOUS | Status: AC
  Filled 2021-06-26: qty 250

## 2021-06-26 MED ORDER — MAGNESIUM OXIDE 400 MG PO TABS: 1.0000 | ORAL_TABLET | Freq: Two times a day (BID) | ORAL | 0 refills | Status: DC

## 2021-06-26 MED ORDER — DIPHENOXYLATE-ATROPINE 2.5-0.025 MG PO TABS: 1.0000 | ORAL_TABLET | Freq: Four times a day (QID) | ORAL | 0 refills | Status: DC | PRN

## 2021-06-26 MED ORDER — SODIUM CHLORIDE 0.9% FLUSH
10.0000 mL | INTRAVENOUS | Status: DC | PRN
  Administered 2021-06-26: 10 mL
  Filled 2021-06-26: qty 10

## 2021-06-26 MED ORDER — PROCHLORPERAZINE MALEATE 10 MG PO TABS
10.0000 mg | ORAL_TABLET | Freq: Once | ORAL | Status: AC
  Administered 2021-06-26: 10 mg via ORAL
  Filled 2021-06-26: qty 1

## 2021-06-26 MED ORDER — ROMIPLOSTIM 125 MCG ~~LOC~~ SOLR
2.0000 ug/kg | Freq: Once | SUBCUTANEOUS | Status: AC
  Administered 2021-06-26: 120 ug via SUBCUTANEOUS
  Filled 2021-06-26: qty 0.24

## 2021-06-26 MED ORDER — FAMOTIDINE 20 MG IN NS 100 ML IVPB
20.0000 mg | Freq: Once | INTRAVENOUS | Status: AC
  Administered 2021-06-26: 20 mg via INTRAVENOUS

## 2021-06-26 MED ORDER — HEPARIN SOD (PORK) LOCK FLUSH 100 UNIT/ML IV SOLN
500.0000 [IU] | Freq: Once | INTRAVENOUS | Status: AC | PRN
  Administered 2021-06-26: 500 [IU]
  Filled 2021-06-26: qty 5

## 2021-06-26 MED ORDER — PACLITAXEL PROTEIN-BOUND CHEMO INJECTION 100 MG
45.0000 mg/m2 | Freq: Once | INTRAVENOUS | Status: AC
  Administered 2021-06-26: 75 mg via INTRAVENOUS
  Filled 2021-06-26: qty 15

## 2021-06-26 NOTE — Addendum Note (Signed)
Addended by: Owens Shark on: 06/26/2021 02:39 PM   Modules accepted: Orders

## 2021-06-26 NOTE — Progress Notes (Signed)
[place] OFFICE PROGRESS NOTE   Diagnosis: Pancreas cancer  INTERVAL HISTORY:   Aaron Johnston returns as scheduled.  He completed another cycle of gemcitabine/Abraxane 06/12/2021.  He denies nausea/vomiting.  He noticed a single mouth sore yesterday.  No fever.  No rash.  No numbness or tingling in the hands or feet.  He describes pain as "good".  Objective:  Vital signs in last 24 hours:  Blood pressure 112/64, pulse 91, temperature 97.8 F (36.6 C), temperature source Oral, resp. rate 18, height '5\' 9"'  (1.753 m), weight 133 lb 14.4 oz (60.7 kg), SpO2 90 %.    HEENT: Round resolving ulceration right buccal mucosa. Resp: Lungs clear bilaterally. Cardio: Regular rate and rhythm. GI: Abdomen soft and nontender.  No hepatosplenomegaly. Vascular: No leg edema. Port-A-Cath without erythema.  Lab Results:  Lab Results  Component Value Date   WBC 11.8 (H) 06/26/2021   HGB 9.5 (L) 06/26/2021   HCT 29.9 (L) 06/26/2021   MCV 95.8 06/26/2021   PLT 94 (L) 06/26/2021   NEUTROABS 9.9 (H) 06/26/2021    Imaging:  No results found.  Medications: I have reviewed the patient's current medications.  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, mild biliary ductal dilatation, diffuse hypodense/hypoenhancing areas within the liver (edema versus infiltrating neoplasm), 2 x 1.7 cm area of focal prominence in the pancreas EUS 07/10/2020-no pancreas mass identified, 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, 1.5 cm aortocaval node biopsy-scant lymphoid material, no malignancy Diagnostic laparoscopy  07/17/2020-segment 3 and 4 liver nodules, adenocarcinoma, positive for pankeratin, CK7 and CK20, partially positive for TTF-1, negative for CDX2 Foundation 1-microsatellite stable, tumor mutation burden 4, K-ras G12D, subclonal RB1 alteration Elevated CA 19-9 Cycle 1 FOLFOX 07/28/2020 Cycle 2 FOLFOX 08/18/2020, oxaliplatin dose reduced due to thrombocytopenia Cycle 3 FOLFOX 09/08/2020 Cycle 4 FOLFOX 09/29/2020  Cycle 5 FOLFOX 10/20/2020 CTs 11/06/2020-grossly stable infiltrative mass within the pancreatic head and porta hepatis.  New small low-density liver lesions.  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 Cycle 6 FOLFOX 11/10/2021  Cycle 7 FOLFOX 12/01/2020 MRI liver 12/04/2020-no significant change in posttreatment appearance of the pancreatic head.  Numerous intrinsically low signal hypoenhancing lesions of the liver parenchyma predominantly observed in the right lobe of the liver and liver dome, measuring no greater than 8 mm and as seen on recent prior CT of the abdomen/pelvis. Cycle 8 FOLFOX 12/22/2020 Cycle 9 FOLFOX 01/12/2021 Cycle 10 FOLFOX 02/02/2021 MRI liver 02/19/2021-no change in pancreas head mass, multiple hypoenhancing liver lesions are new and increased in size, effacement of portal vein by pancreas head mass with cavernous transformation, no change in mild splenomegaly Cycle 1 gemcitabine/Abraxane 03/12/2021 Cycle 2 gemcitabine/Abraxane 03/27/2021- dose reductions secondary to neutropenia Cycle 3 gemcitabine/Abraxane 04/17/2021-dose reductions due to thrombocytopenia, white cell growth factor support added Cycle 4 gemcitabine/Abraxane 05/01/2021, Ziextenzo Cycle 5 gemcitabine/Abraxane 05/15/2021, Ziextenzo MRI abdomen 05/26/2021-mild enlargement of dominant hepatic metastases, appearance of infiltrative pancreas mass extending to the celiac trunk and SMA, splenic vein  occlusion, cavernous transformation of the portal vein with upper abdomen collaterals, splenomegaly, mild upper abdominal ascites and mesenteric edema,  minimal subpleural nodularity left lower lobe Cycle 6 gemcitabine/Abraxane 05/28/2021 Cycle 7 gemcitabine/Abraxane 06/12/2021 Cycle 8 gemcitabine/Abraxane 06/26/2021   Chronic thrombocytopenia- weekly Nplate beginning 9/77/4142 Diabetes Hypertension Abdomen/back pain secondary to #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  He has completed 7 cycles of gemcitabine/Abraxane.  There is no clinical evidence of disease progression.  Plan to proceed with cycle 8 today as scheduled.  We reviewed the CBC from today.  Counts are adequate to proceed with treatment.  He has stable mild thrombocytopenia.  Plan to continue weekly Nplate.  He will return for lab and Nplate in 1 week.  We will see him in follow-up in 2 weeks.  He will contact the office in the interim with any problems.      [name] ANP/GNP-BC   06/26/2021  8:45 AM

## 2021-06-26 NOTE — Patient Instructions (Signed)
Aaron Johnston  Discharge Instructions: Thank you for choosing [facility] to provide your oncology and hematology care.   If you have a lab appointment with the [facility], please go directly to the [facility] and check in at the registration area.   Wear comfortable clothing and clothing appropriate for easy access to any Portacath or PICC line.   We strive to give you quality time with your provider. You may need to reschedule your appointment if you arrive late (15 or more minutes).  Arriving late affects you and other patients whose appointments are after yours.  Also, if you miss three or more appointments without notifying the office, you may be dismissed from the clinic at the provider's discretion.      For prescription refill requests, have your pharmacy contact our office and allow 72 hours for refills to be completed.    Today you received the following chemotherapy and/or immunotherapy agents: Paclitaxel Protein-Bound, Gemcitabine   To help prevent nausea and vomiting after your treatment, we encourage you to take your nausea medication as directed.  BELOW ARE SYMPTOMS THAT SHOULD BE REPORTED IMMEDIATELY: *FEVER GREATER THAN 100.4 F (38 C) OR HIGHER *CHILLS OR SWEATING *NAUSEA AND VOMITING THAT IS NOT CONTROLLED WITH YOUR NAUSEA MEDICATION *UNUSUAL SHORTNESS OF BREATH *UNUSUAL BRUISING OR BLEEDING *URINARY PROBLEMS (pain or burning when urinating, or frequent urination) *BOWEL PROBLEMS (unusual diarrhea, constipation, pain near the anus) TENDERNESS IN MOUTH AND THROAT WITH OR WITHOUT PRESENCE OF ULCERS (sore throat, sores in mouth, or a toothache) UNUSUAL RASH, SWELLING OR PAIN  UNUSUAL VAGINAL DISCHARGE OR ITCHING   Items with * indicate a potential emergency and should be followed up as soon as possible or go to the Emergency Department if any problems should occur.  Please show the CHEMOTHERAPY ALERT CARD or IMMUNOTHERAPY  ALERT CARD at check-in to the Emergency Department and triage nurse.  Should you have questions after your visit or need to cancel or reschedule your appointment, please contact [facility]  Dept: [phone]  and follow the prompts.  Office hours are 8:00 a.m. to 4:30 p.m. Monday - Friday. Please note that voicemails left after 4:00 p.m. may not be returned until the following business day.  We are closed weekends and major holidays. You have access to a nurse at all times for urgent questions. Please call the main number to the clinic Dept: [phone] and follow the prompts.   For any non-urgent questions, you may also contact your provider using MyChart. We now offer e-Visits for anyone 36 and older to request care online for non-urgent symptoms. For details visit [email or web address].   Also download the MyChart app! Go to the app store, search "MyChart", open the app, select [facility], and log in with your MyChart username and password.  Due to Covid, a mask is required upon entering the hospital/clinic. If you do not have a mask, one will be given to you upon arrival. For doctor visits, patients may have 1 support person aged 64 or older with them. For treatment visits, patients cannot have anyone with them due to current Covid guidelines and our immunocompromised population.   Nanoparticle Albumin-Bound Paclitaxel injection What is this medication? NANOPARTICLE ALBUMIN-BOUND PACLITAXEL (Na no PAHR ti kuhl al BYOO muhn-bound PAK li TAX el) is a chemotherapy drug. It targets fast dividing cells, like cancer cells, and causes these cells to die. This medicine is used to treatadvanced breast cancer, lung  cancer, and pancreatic cancer. This medicine may be used for other purposes; ask your health care provider orpharmacist if you have questions. COMMON BRAND NAME(S): Abraxane What should I tell my care team before I take this medication? They need to know if you  have any of these conditions: kidney disease liver disease low blood counts, like low white cell, platelet, or red cell counts lung or breathing disease, like asthma tingling of the fingers or toes, or other nerve disorder an unusual or allergic reaction to paclitaxel, albumin, other chemotherapy, other medicines, foods, dyes, or preservatives pregnant or trying to get pregnant breast-feeding How should I use this medication? This drug is given as an infusion into a vein. It is administered in a hospitalor clinic by a specially trained health care professional. Talk to your pediatrician regarding the use of this medicine in children.Special care may be needed. Overdosage: If you think you have taken too much of this medicine contact apoison control center or emergency room at once. NOTE: This medicine is only for you. Do not share this medicine with others. What if I miss a dose? It is important not to miss your dose. Call your doctor or health careprofessional if you are unable to keep an appointment. What may interact with this medication? This medicine may interact with the following medications: antiviral medicines for hepatitis, HIV or AIDS certain antibiotics like erythromycin and clarithromycin certain medicines for fungal infections like ketoconazole and itraconazole certain medicines for seizures like carbamazepine, phenobarbital, phenytoin gemfibrozil nefazodone rifampin St. John's wort This list may not describe all possible interactions. Give your health care provider a list of all the medicines, herbs, non-prescription drugs, or dietary supplements you use. Also tell them if you smoke, drink alcohol, or use illegaldrugs. Some items may interact with your medicine. What should I watch for while using this medication? Your condition will be monitored carefully while you are receiving this medicine. You will need important blood work done while you are taking thismedicine. This  medicine can cause serious allergic reactions. If you experience allergic reactions like skin rash, itching or hives, swelling of the face, lips, ortongue, tell your doctor or health care professional right away. In some cases, you may be given additional medicines to help with side effects.Follow all directions for their use. This drug may make you feel generally unwell. This is not uncommon, as chemotherapy can affect healthy cells as well as cancer cells. Report any side effects. Continue your course of treatment even though you feel ill unless yourdoctor tells you to stop. Call your doctor or health care professional for advice if you get a fever, chills or sore throat, or other symptoms of a cold or flu. Do not treat yourself. This drug decreases your body's ability to fight infections. Try toavoid being around people who are sick. This medicine may increase your risk to bruise or bleed. Call your doctor orhealth care professional if you notice any unusual bleeding. Be careful brushing and flossing your teeth or using a toothpick because you may get an infection or bleed more easily. If you have any dental work done,tell your dentist you are receiving this medicine. Avoid taking products that contain aspirin, acetaminophen, ibuprofen, naproxen, or ketoprofen unless instructed by your doctor. These medicines may hide afever. Do not become pregnant while taking this medicine or for 6 months after stopping it. Women should inform their doctor if they wish to become pregnant or think they might be pregnant. Men should not father a  child while taking this medicine or for 3 months after stopping it. There is a potential for serious side effects to an unborn child. Talk to your health care professionalor pharmacist for more information. Do not breast-feed an infant while taking this medicine or for 2 weeks afterstopping it. This medicine may interfere with the ability to get pregnant or to father a child. You  should talk to your doctor or health care professional if you areconcerned about your fertility. What side effects may I notice from receiving this medication? Side effects that you should report to your doctor or health care professionalas soon as possible: allergic reactions like skin rash, itching or hives, swelling of the face, lips, or tongue breathing problems changes in vision fast, irregular heartbeat low blood pressure mouth sores pain, tingling, numbness in the hands or feet signs of decreased platelets or bleeding - bruising, pinpoint red spots on the skin, black, tarry stools, blood in the urine signs of decreased red blood cells - unusually weak or tired, feeling faint or lightheaded, falls signs of infection - fever or chills, cough, sore throat, pain or difficulty passing urine signs and symptoms of liver injury like dark yellow or brown urine; general ill feeling or flu-like symptoms; light-colored stools; loss of appetite; nausea; right upper belly pain; unusually weak or tired; yellowing of the eyes or skin swelling of the ankles, feet, hands unusually slow heartbeat Side effects that usually do not require medical attention (report to yourdoctor or health care professional if they continue or are bothersome): diarrhea hair loss loss of appetite nausea, vomiting tiredness This list may not describe all possible side effects. Call your doctor for medical advice about side effects. You may report side effects to FDA at1-800-FDA-1088. Where should I keep my medication? This drug is given in a hospital or clinic and will not be stored at home. NOTE: This sheet is a summary. It may not cover all possible information. If you have questions about this medicine, talk to your doctor, pharmacist, orhealth care provider.  2022 Elsevier/Gold Standard (2017-07-05 13:03:45)  Gemcitabine injection What is this medication? GEMCITABINE (jem SYE ta been) is a chemotherapy drug. This  medicine is used to treat many types of cancer like breast cancer, lung cancer, pancreatic cancer,and ovarian cancer. This medicine may be used for other purposes; ask your health care provider orpharmacist if you have questions. COMMON BRAND NAME(S): Gemzar, Infugem What should I tell my care team before I take this medication? They need to know if you have any of these conditions: blood disorders infection kidney disease liver disease lung or breathing disease, like asthma recent or ongoing radiation therapy an unusual or allergic reaction to gemcitabine, other chemotherapy, other medicines, foods, dyes, or preservatives pregnant or trying to get pregnant breast-feeding How should I use this medication? This drug is given as an infusion into a vein. It is administered in a hospitalor clinic by a specially trained health care professional. Talk to your pediatrician regarding the use of this medicine in children.Special care may be needed. Overdosage: If you think you have taken too much of this medicine contact apoison control center or emergency room at once. NOTE: This medicine is only for you. Do not share this medicine with others. What if I miss a dose? It is important not to miss your dose. Call your doctor or health careprofessional if you are unable to keep an appointment. What may interact with this medication? medicines to increase blood counts like filgrastim, pegfilgrastim,  sargramostim some other chemotherapy drugs like cisplatin vaccines Talk to your doctor or health care professional before taking any of thesemedicines: acetaminophen aspirin ibuprofen ketoprofen naproxen This list may not describe all possible interactions. Give your health care provider a list of all the medicines, herbs, non-prescription drugs, or dietary supplements you use. Also tell them if you smoke, drink alcohol, or use illegaldrugs. Some items may interact with your medicine. What should I  watch for while using this medication? Visit your doctor for checks on your progress. This drug may make you feel generally unwell. This is not uncommon, as chemotherapy can affect healthy cells as well as cancer cells. Report any side effects. Continue your course oftreatment even though you feel ill unless your doctor tells you to stop. In some cases, you may be given additional medicines to help with side effects.Follow all directions for their use. Call your doctor or health care professional for advice if you get a fever, chills or sore throat, or other symptoms of a cold or flu. Do not treat yourself. This drug decreases your body's ability to fight infections. Try toavoid being around people who are sick. This medicine may increase your risk to bruise or bleed. Call your doctor orhealth care professional if you notice any unusual bleeding. Be careful brushing and flossing your teeth or using a toothpick because you may get an infection or bleed more easily. If you have any dental work done,tell your dentist you are receiving this medicine. Avoid taking products that contain aspirin, acetaminophen, ibuprofen, naproxen, or ketoprofen unless instructed by your doctor. These medicines may hide afever. Do not become pregnant while taking this medicine or for 6 months after stopping it. Women should inform their doctor if they wish to become pregnant or think they might be pregnant. Men should not father a child while taking this medicine and for 3 months after stopping it. There is a potential for serious side effects to an unborn child. Talk to your health care professional or pharmacist for more information. Do not breast-feed an infant while takingthis medicine or for at least 1 week after stopping it. Men should inform their doctors if they wish to father a child. This medicine may lower sperm counts. Talk with your doctor or health care professional ifyou are concerned about your fertility. What side  effects may I notice from receiving this medication? Side effects that you should report to your doctor or health care professionalas soon as possible: allergic reactions like skin rash, itching or hives, swelling of the face, lips, or tongue breathing problems pain, redness, or irritation at site where injected signs and symptoms of a dangerous change in heartbeat or heart rhythm like chest pain; dizziness; fast or irregular heartbeat; palpitations; feeling faint or lightheaded, falls; breathing problems signs of decreased platelets or bleeding - bruising, pinpoint red spots on the skin, black, tarry stools, blood in the urine signs of decreased red blood cells - unusually weak or tired, feeling faint or lightheaded, falls signs of infection - fever or chills, cough, sore throat, pain or difficulty passing urine signs and symptoms of kidney injury like trouble passing urine or change in the amount of urine signs and symptoms of liver injury like dark yellow or brown urine; general ill feeling or flu-like symptoms; light-colored stools; loss of appetite; nausea; right upper belly pain; unusually weak or tired; yellowing of the eyes or skin swelling of ankles, feet, hands Side effects that usually do not require medical attention (report to  yourdoctor or health care professional if they continue or are bothersome): constipation diarrhea hair loss loss of appetite nausea rash vomiting This list may not describe all possible side effects. Call your doctor for medical advice about side effects. You may report side effects to FDA at1-800-FDA-1088. Where should I keep my medication? This drug is given in a hospital or clinic and will not be stored at home. NOTE: This sheet is a summary. It may not cover all possible information. If you have questions about this medicine, talk to your doctor, pharmacist, orhealth care provider.  2022 Elsevier/Gold Standard (2018-01-25 18:06:11)

## 2021-06-26 NOTE — Patient Instructions (Signed)
Implanted Port Home Guide An implanted port is a device that is placed under the skin. It is usually placed in the chest. The device can be used to give IV medicine, to take blood, or for dialysis. You may have an implanted port if: You need IV medicine that would be irritating to the small veins in your hands or arms. You need IV medicines, such as antibiotics, for a long period of time. You need IV nutrition for a long period of time. You need dialysis. When you have a port, your health care provider can choose to use the port instead of veins in your arms for these procedures. You may have fewer limitations when using a port than you would if you used other types of long-term IVs, and you will likely be able to return to normal activities afteryour incision heals. An implanted port has two main parts: Reservoir. The reservoir is the part where a needle is inserted to give medicines or draw blood. The reservoir is round. After it is placed, it appears as a small, raised area under your skin. Catheter. The catheter is a thin, flexible tube that connects the reservoir to a vein. Medicine that is inserted into the reservoir goes into the catheter and then into the vein. How is my port accessed? To access your port: A numbing cream may be placed on the skin over the port site. Your health care provider will put on a mask and sterile gloves. The skin over your port will be cleaned carefully with a germ-killing soap and allowed to dry. Your health care provider will gently pinch the port and insert a needle into it. Your health care provider will check for a blood return to make sure the port is in the vein and is not clogged. If your port needs to remain accessed to get medicine continuously (constant infusion), your health care provider will place a clear bandage (dressing) over the needle site. The dressing and needle will need to be changed every week, or as told by your health care provider. What  is flushing? Flushing helps keep the port from getting clogged. Follow instructions from your health care provider about how and when to flush the port. Ports are usually flushed with saline solution or a medicine called heparin. The need for flushing will depend on how the port is used: If the port is only used from time to time to give medicines or draw blood, the port may need to be flushed: Before and after medicines have been given. Before and after blood has been drawn. As part of routine maintenance. Flushing may be recommended every 4-6 weeks. If a constant infusion is running, the port may not need to be flushed. Throw away any syringes in a disposal container that is meant for sharp items (sharps container). You can buy a sharps container from a pharmacy, or you can make one by using an empty hard plastic bottle with a cover. How long will my port stay implanted? The port can stay in for as long as your health care provider thinks it is needed. When it is time for the port to come out, a surgery will be done to remove it. The surgery will be similar to the procedure that was done to putthe port in. Follow these instructions at home:  Flush your port as told by your health care provider. If you need an infusion over several days, follow instructions from your health care provider about how to take   care of your port site. Make sure you: Wash your hands with soap and water before you change your dressing. If soap and water are not available, use alcohol-based hand sanitizer. Change your dressing as told by your health care provider. Place any used dressings or infusion bags into a plastic bag. Throw that bag in the trash. Keep the dressing that covers the needle clean and dry. Do not get it wet. Do not use scissors or sharp objects near the tube. Keep the tube clamped, unless it is being used. Check your port site every day for signs of infection. Check for: Redness, swelling, or  pain. Fluid or blood. Pus or a bad smell. Protect the skin around the port site. Avoid wearing bra straps that rub or irritate the site. Protect the skin around your port from seat belts. Place a soft pad over your chest if needed. Bathe or shower as told by your health care provider. The site may get wet as long as you are not actively receiving an infusion. Return to your normal activities as told by your health care provider. Ask your health care provider what activities are safe for you. Carry a medical alert card or wear a medical alert bracelet at all times. This will let health care providers know that you have an implanted port in case of an emergency. Get help right away if: You have redness, swelling, or pain at the port site. You have fluid or blood coming from your port site. You have pus or a bad smell coming from the port site. You have a fever. Summary Implanted ports are usually placed in the chest for long-term IV access. Follow instructions from your health care provider about flushing the port and changing bandages (dressings). Take care of the area around your port by avoiding clothing that puts pressure on the area, and by watching for signs of infection. Protect the skin around your port from seat belts. Place a soft pad over your chest if needed. Get help right away if you have a fever or you have redness, swelling, pain, drainage, or a bad smell at the port site. This information is not intended to replace advice given to you by your health care provider. Make sure you discuss any questions you have with your healthcare provider. Document Revised: 03/17/2020 Document Reviewed: 03/17/2020 Elsevier Patient Education  2022 [employer]  

## 2021-06-27 ENCOUNTER — Other Ambulatory Visit: Payer: Self-pay

## 2021-06-27 ENCOUNTER — Inpatient Hospital Stay: Payer: Medicare Other

## 2021-06-27 VITALS — BP 127/59 | HR 71 | Temp 98.1°F | Resp 16

## 2021-06-27 DIAGNOSIS — C250 Malignant neoplasm of head of pancreas: Secondary | ICD-10-CM

## 2021-06-27 DIAGNOSIS — Z5111 Encounter for antineoplastic chemotherapy: Secondary | ICD-10-CM | POA: Diagnosis not present

## 2021-06-27 LAB — CANCER ANTIGEN 19-9: CA 19-9: 2036 U/mL — ABNORMAL HIGH (ref 0–35)

## 2021-06-27 MED ORDER — PEGFILGRASTIM-BMEZ 6 MG/0.6ML ~~LOC~~ SOSY
6.0000 mg | PREFILLED_SYRINGE | Freq: Once | SUBCUTANEOUS | Status: AC
  Administered 2021-06-27: 6 mg via SUBCUTANEOUS

## 2021-06-27 NOTE — Patient Instructions (Signed)

## 2021-07-03 ENCOUNTER — Inpatient Hospital Stay: Payer: Medicare Other

## 2021-07-03 ENCOUNTER — Other Ambulatory Visit: Payer: Self-pay

## 2021-07-03 ENCOUNTER — Other Ambulatory Visit: Payer: Self-pay | Admitting: Nurse Practitioner

## 2021-07-03 VITALS — BP 104/55 | HR 71 | Temp 97.1°F | Resp 18

## 2021-07-03 DIAGNOSIS — Z95828 Presence of other vascular implants and grafts: Secondary | ICD-10-CM

## 2021-07-03 DIAGNOSIS — C250 Malignant neoplasm of head of pancreas: Secondary | ICD-10-CM

## 2021-07-03 DIAGNOSIS — Z5111 Encounter for antineoplastic chemotherapy: Secondary | ICD-10-CM | POA: Diagnosis not present

## 2021-07-03 LAB — CBC WITH DIFFERENTIAL (CANCER CENTER ONLY)
Abs Immature Granulocytes: 0.3 10*3/uL — ABNORMAL HIGH (ref 0.00–0.07)
Basophils Absolute: 0.1 10*3/uL (ref 0.0–0.1)
Basophils Relative: 0 %
Eosinophils Absolute: 0.2 10*3/uL (ref 0.0–0.5)
Eosinophils Relative: 1 %
HCT: 26.6 % — ABNORMAL LOW (ref 39.0–52.0)
Hemoglobin: 8.6 g/dL — ABNORMAL LOW (ref 13.0–17.0)
Immature Granulocytes: 2 %
Lymphocytes Relative: 5 %
Lymphs Abs: 0.9 10*3/uL (ref 0.7–4.0)
MCH: 30.7 pg (ref 26.0–34.0)
MCHC: 32.3 g/dL (ref 30.0–36.0)
MCV: 95 fL (ref 80.0–100.0)
Monocytes Absolute: 0.7 10*3/uL (ref 0.1–1.0)
Monocytes Relative: 4 %
Neutro Abs: 14.8 10*3/uL — ABNORMAL HIGH (ref 1.7–7.7)
Neutrophils Relative %: 88 %
Platelet Count: 105 10*3/uL — ABNORMAL LOW (ref 150–400)
RBC: 2.8 MIL/uL — ABNORMAL LOW (ref 4.22–5.81)
RDW: 17.2 % — ABNORMAL HIGH (ref 11.5–15.5)
WBC Count: 16.9 10*3/uL — ABNORMAL HIGH (ref 4.0–10.5)
nRBC: 0 % (ref 0.0–0.2)

## 2021-07-03 MED ORDER — SODIUM CHLORIDE 0.9% FLUSH
10.0000 mL | INTRAVENOUS | Status: DC | PRN
Start: 2021-07-03 — End: 2021-07-03
  Administered 2021-07-03: 10 mL

## 2021-07-03 MED ORDER — EPINEPHRINE 0.3 MG/0.3ML IJ SOAJ: 0.3000 mg | Freq: Once | INTRAMUSCULAR | Status: DC | PRN

## 2021-07-03 MED ORDER — ROMIPLOSTIM 125 MCG ~~LOC~~ SOLR
2.0000 ug/kg | Freq: Once | SUBCUTANEOUS | Status: AC
  Administered 2021-07-03: 120 ug via SUBCUTANEOUS
  Filled 2021-07-03: qty 0.24

## 2021-07-03 MED ORDER — CILGAVIMAB (PART OF EVUSHELD) INJECTION: 300.0000 mg | Freq: Once | INTRAMUSCULAR | Status: DC

## 2021-07-03 MED ORDER — DIPHENHYDRAMINE HCL 50 MG/ML IJ SOLN: 50.0000 mg | Freq: Once | INTRAMUSCULAR | Status: DC | PRN

## 2021-07-03 MED ORDER — METHYLPREDNISOLONE SODIUM SUCC 125 MG IJ SOLR
125.0000 mg | Freq: Once | INTRAMUSCULAR | Status: DC | PRN
Start: 2021-07-03 — End: 2021-07-03

## 2021-07-03 MED ORDER — ALBUTEROL SULFATE HFA 108 (90 BASE) MCG/ACT IN AERS: 2.0000 | INHALATION_SPRAY | Freq: Once | RESPIRATORY_TRACT | Status: DC | PRN

## 2021-07-03 MED ORDER — TIXAGEVIMAB (PART OF EVUSHELD) INJECTION: 300.0000 mg | Freq: Once | INTRAMUSCULAR | Status: DC

## 2021-07-03 MED ORDER — METHYLPREDNISOLONE SODIUM SUCC 125 MG IJ SOLR: 125.0000 mg | Freq: Once | INTRAMUSCULAR | Status: DC | PRN

## 2021-07-03 MED ORDER — HEPARIN SOD (PORK) LOCK FLUSH 100 UNIT/ML IV SOLN
500.0000 [IU] | Freq: Once | INTRAVENOUS | Status: AC | PRN
  Administered 2021-07-03: 500 [IU]

## 2021-07-03 NOTE — Addendum Note (Signed)
Addended by: Roselind Messier A on: 07/03/2021 03:33 PM   Modules accepted: Orders

## 2021-07-03 NOTE — Patient Instructions (Signed)
Romiplostim injection What is this medication? ROMIPLOSTIM (roe mi PLOE stim) helps your body make more platelets. This medicine is used to treat low platelets caused by chronic idiopathic thrombocytopenic purpura (ITP) or a bone marrow syndrome caused by radiation sickness. This medicine may be used for other purposes; ask your health care provider or pharmacist if you have questions. COMMON BRAND NAME(S): Nplate What should I tell my care team before I take this medication? They need to know if you have any of these conditions: blood clots myelodysplastic syndrome an unusual or allergic reaction to romiplostim, mannitol, other medicines, foods, dyes, or preservatives pregnant or trying to get pregnant breast-feeding How should I use this medication? This medicine is injected under the skin. It is given by a health care provider in a hospital or clinic setting. A special MedGuide will be given to you before each treatment. Be sure to read this information carefully each time. Talk to your health care provider about the use of this medicine in children. While it may be prescribed for children as young as newborns for selected conditions, precautions do apply. Overdosage: If you think you have taken too much of this medicine contact a poison control center or emergency room at once. NOTE: This medicine is only for you. Do not share this medicine with others. What if I miss a dose? Keep appointments for follow-up doses. It is important not to miss your dose. Call your health care provider if you are unable to keep an appointment. What may interact with this medication? Interactions are not expected. This list may not describe all possible interactions. Give your health care provider a list of all the medicines, herbs, non-prescription drugs, or dietary supplements you use. Also tell them if you smoke, drink alcohol, or use illegal drugs. Some items may interact with your medicine. What should I  watch for while using this medication? Visit your health care provider for regular checks on your progress. You may need blood work done while you are taking this medicine. Your condition will be monitored carefully while you are receiving this medicine. It is important not to miss any appointments. What side effects may I notice from receiving this medication? Side effects that you should report to your doctor or health care professional as soon as possible: allergic reactions (skin rash, itching or hives; swelling of the face, lips, or tongue) bleeding (bloody or black, tarry stools; red or dark brown urine; spitting up blood or brown material that looks like coffee grounds; red spots on the skin; unusual bruising or bleeding from the eyes, gums, or nose) blood clot (chest pain; shortness of breath; pain, swelling, or warmth in the leg) stroke (changes in vision; confusion; trouble speaking or understanding; severe headaches; sudden numbness or weakness of the face, arm or leg; trouble walking; dizziness; loss of balance or coordination) Side effects that usually do not require medical attention (report to your doctor or health care professional if they continue or are bothersome): diarrhea dizziness headache joint pain muscle pain stomach pain trouble sleeping This list may not describe all possible side effects. Call your doctor for medical advice about side effects. You may report side effects to FDA at 1-800-FDA-1088. Where should I keep my medication? This medicine is given in a hospital or clinic. It will not be stored at home. NOTE: This sheet is a summary. It may not cover all possible information. If you have questions about this medicine, talk to your doctor, pharmacist, or health care provider.    2022 Elsevier/Gold Standard (2019-12-17 10:28:13)  

## 2021-07-03 NOTE — Progress Notes (Signed)
I connected by phone with Aaron Johnston on 07/03/2021, 3:06 PM to discuss the potential use of a new treatment, tixagevimab/cilgavimab, for pre-exposure prophylaxis for prevention of coronavirus disease 2019 (COVID-19) caused by the SARS-CoV-2 virus.  This patient is a 75 y.o. male that meets the FDA criteria for Emergency Use Authorization of tixagevimab/cilgavimab for pre-exposure prophylaxis of COVID-19 disease. Pt meets following criteria: Age >12 yr and weight > 40kg Not currently infected with SARS-CoV-2 and has no known recent exposure to an individual infected with SARS-CoV-2 AND Who has moderate to severe immune compromise due to a medical condition or receipt of immunosuppressive medications or treatments and may not mount an adequate immune response to COVID-19 vaccination or  Vaccination with any available COVID-19 vaccine, according to the approved or authorized schedule, is not recommended due to a history of severe adverse reaction (e.g., severe allergic reaction) to a COVID-19 vaccine(s) and/or COVID-19 vaccine component(s).  Patient meets the following definition of mod-severe immune compromised status: 7. Solid tumor malignancies on immunomodulatory chemotherapy or advanced AIDS   I have spoken and communicated the following to the patient or parent/caregiver regarding COVID monoclonal antibody treatment:  FDA has authorized the emergency use of tixagevimab/cilgavimab for the pre-exposure prophylaxis of COVID-19 in patients with moderate-severe immunocompromised status, who meet above EUA criteria.  The significant known and potential risks and benefits of COVID monoclonal antibody, and the extent to which such potential risks and benefits are unknown.  Information on available alternative treatments and the risks and benefits of those alternatives, including clinical trials.  The patient or parent/caregiver has the option to accept or refuse COVID monoclonal antibody  treatment.  After reviewing this information with the patient, agree to receive tixagevimab/cilgavimab  Aaron Card, NP, 07/03/2021, 3:06 PM

## 2021-07-03 NOTE — Patient Instructions (Signed)
Implanted Port Home Guide An implanted port is a device that is placed under the skin. It is usually placed in the chest. The device can be used to give IV medicine, to take blood, or for dialysis. You may have an implanted port if: You need IV medicine that would be irritating to the small veins in your hands or arms. You need IV medicines, such as antibiotics, for a long period of time. You need IV nutrition for a long period of time. You need dialysis. When you have a port, your health care provider can choose to use the port instead of veins in your arms for these procedures. You may have fewer limitations when using a port than you would if you used other types of long-term IVs, and you will likely be able to return to normal activities afteryour incision heals. An implanted port has two main parts: Reservoir. The reservoir is the part where a needle is inserted to give medicines or draw blood. The reservoir is round. After it is placed, it appears as a small, raised area under your skin. Catheter. The catheter is a thin, flexible tube that connects the reservoir to a vein. Medicine that is inserted into the reservoir goes into the catheter and then into the vein. How is my port accessed? To access your port: A numbing cream may be placed on the skin over the port site. Your health care provider will put on a mask and sterile gloves. The skin over your port will be cleaned carefully with a germ-killing soap and allowed to dry. Your health care provider will gently pinch the port and insert a needle into it. Your health care provider will check for a blood return to make sure the port is in the vein and is not clogged. If your port needs to remain accessed to get medicine continuously (constant infusion), your health care provider will place a clear bandage (dressing) over the needle site. The dressing and needle will need to be changed every week, or as told by your health care provider. What  is flushing? Flushing helps keep the port from getting clogged. Follow instructions from your health care provider about how and when to flush the port. Ports are usually flushed with saline solution or a medicine called heparin. The need for flushing will depend on how the port is used: If the port is only used from time to time to give medicines or draw blood, the port may need to be flushed: Before and after medicines have been given. Before and after blood has been drawn. As part of routine maintenance. Flushing may be recommended every 4-6 weeks. If a constant infusion is running, the port may not need to be flushed. Throw away any syringes in a disposal container that is meant for sharp items (sharps container). You can buy a sharps container from a pharmacy, or you can make one by using an empty hard plastic bottle with a cover. How long will my port stay implanted? The port can stay in for as long as your health care provider thinks it is needed. When it is time for the port to come out, a surgery will be done to remove it. The surgery will be similar to the procedure that was done to putthe port in. Follow these instructions at home:  Flush your port as told by your health care provider. If you need an infusion over several days, follow instructions from your health care provider about how to take   care of your port site. Make sure you: Wash your hands with soap and water before you change your dressing. If soap and water are not available, use alcohol-based hand sanitizer. Change your dressing as told by your health care provider. Place any used dressings or infusion bags into a plastic bag. Throw that bag in the trash. Keep the dressing that covers the needle clean and dry. Do not get it wet. Do not use scissors or sharp objects near the tube. Keep the tube clamped, unless it is being used. Check your port site every day for signs of infection. Check for: Redness, swelling, or  pain. Fluid or blood. Pus or a bad smell. Protect the skin around the port site. Avoid wearing bra straps that rub or irritate the site. Protect the skin around your port from seat belts. Place a soft pad over your chest if needed. Bathe or shower as told by your health care provider. The site may get wet as long as you are not actively receiving an infusion. Return to your normal activities as told by your health care provider. Ask your health care provider what activities are safe for you. Carry a medical alert card or wear a medical alert bracelet at all times. This will let health care providers know that you have an implanted port in case of an emergency. Get help right away if: You have redness, swelling, or pain at the port site. You have fluid or blood coming from your port site. You have pus or a bad smell coming from the port site. You have a fever. Summary Implanted ports are usually placed in the chest for long-term IV access. Follow instructions from your health care provider about flushing the port and changing bandages (dressings). Take care of the area around your port by avoiding clothing that puts pressure on the area, and by watching for signs of infection. Protect the skin around your port from seat belts. Place a soft pad over your chest if needed. Get help right away if you have a fever or you have redness, swelling, pain, drainage, or a bad smell at the port site. This information is not intended to replace advice given to you by your health care provider. Make sure you discuss any questions you have with your healthcare provider. Document Revised: 03/17/2020 Document Reviewed: 03/17/2020 Elsevier Patient Education  2022 [employer]  

## 2021-07-03 NOTE — Addendum Note (Signed)
Addended by: Roselind Messier A on: 07/03/2021 03:49 PM   Modules accepted: Orders

## 2021-07-05 ENCOUNTER — Other Ambulatory Visit: Payer: Self-pay | Admitting: Oncology

## 2021-07-10 ENCOUNTER — Inpatient Hospital Stay: Payer: Medicare Other

## 2021-07-10 ENCOUNTER — Inpatient Hospital Stay (HOSPITAL_BASED_OUTPATIENT_CLINIC_OR_DEPARTMENT_OTHER): Payer: Medicare Other | Admitting: Oncology

## 2021-07-10 ENCOUNTER — Other Ambulatory Visit: Payer: Self-pay

## 2021-07-10 VITALS — BP 108/59 | HR 70 | Temp 98.1°F | Resp 18 | Ht 69.0 in | Wt 132.2 lb

## 2021-07-10 DIAGNOSIS — D701 Agranulocytosis secondary to cancer chemotherapy: Secondary | ICD-10-CM | POA: Diagnosis not present

## 2021-07-10 DIAGNOSIS — D6949 Other primary thrombocytopenia: Secondary | ICD-10-CM | POA: Diagnosis not present

## 2021-07-10 DIAGNOSIS — C250 Malignant neoplasm of head of pancreas: Secondary | ICD-10-CM | POA: Diagnosis not present

## 2021-07-10 DIAGNOSIS — Z5111 Encounter for antineoplastic chemotherapy: Secondary | ICD-10-CM | POA: Diagnosis not present

## 2021-07-10 DIAGNOSIS — T451X5A Adverse effect of antineoplastic and immunosuppressive drugs, initial encounter: Secondary | ICD-10-CM | POA: Diagnosis not present

## 2021-07-10 DIAGNOSIS — Z95828 Presence of other vascular implants and grafts: Secondary | ICD-10-CM

## 2021-07-10 DIAGNOSIS — C787 Secondary malignant neoplasm of liver and intrahepatic bile duct: Secondary | ICD-10-CM | POA: Diagnosis present

## 2021-07-10 DIAGNOSIS — Z298 Encounter for other specified prophylactic measures: Secondary | ICD-10-CM | POA: Diagnosis not present

## 2021-07-10 LAB — CMP (CANCER CENTER ONLY)
ALT: 33 U/L (ref 0–44)
AST: 21 U/L (ref 15–41)
Albumin: 3.6 g/dL (ref 3.5–5.0)
Alkaline Phosphatase: 226 U/L — ABNORMAL HIGH (ref 38–126)
Anion gap: 9 (ref 5–15)
BUN: 25 mg/dL — ABNORMAL HIGH (ref 8–23)
CO2: 18 mmol/L — ABNORMAL LOW (ref 22–32)
Calcium: 8.6 mg/dL — ABNORMAL LOW (ref 8.9–10.3)
Chloride: 112 mmol/L — ABNORMAL HIGH (ref 98–111)
Creatinine: 1.3 mg/dL — ABNORMAL HIGH (ref 0.61–1.24)
GFR, Estimated: 58 mL/min — ABNORMAL LOW (ref >60)
Glucose, Bld: 120 mg/dL — ABNORMAL HIGH (ref 70–99)
Potassium: 3.3 mmol/L — ABNORMAL LOW (ref 3.5–5.1)
Sodium: 139 mmol/L (ref 135–145)
Total Bilirubin: 0.9 mg/dL (ref 0.3–1.2)
Total Protein: 6.4 g/dL — ABNORMAL LOW (ref 6.5–8.1)

## 2021-07-10 LAB — CBC WITH DIFFERENTIAL (CANCER CENTER ONLY)
Abs Immature Granulocytes: 0.08 10*3/uL — ABNORMAL HIGH (ref 0.00–0.07)
Basophils Absolute: 0.1 10*3/uL (ref 0.0–0.1)
Basophils Relative: 1 %
Eosinophils Absolute: 0.4 10*3/uL (ref 0.0–0.5)
Eosinophils Relative: 5 %
HCT: 27 % — ABNORMAL LOW (ref 39.0–52.0)
Hemoglobin: 8.7 g/dL — ABNORMAL LOW (ref 13.0–17.0)
Immature Granulocytes: 1 %
Lymphocytes Relative: 7 %
Lymphs Abs: 0.7 10*3/uL (ref 0.7–4.0)
MCH: 31.4 pg (ref 26.0–34.0)
MCHC: 32.2 g/dL (ref 30.0–36.0)
MCV: 97.5 fL (ref 80.0–100.0)
Monocytes Absolute: 0.6 10*3/uL (ref 0.1–1.0)
Monocytes Relative: 6 %
Neutro Abs: 7.8 10*3/uL — ABNORMAL HIGH (ref 1.7–7.7)
Neutrophils Relative %: 80 %
Platelet Count: 89 10*3/uL — ABNORMAL LOW (ref 150–400)
RBC: 2.77 MIL/uL — ABNORMAL LOW (ref 4.22–5.81)
RDW: 17.7 % — ABNORMAL HIGH (ref 11.5–15.5)
WBC Count: 9.6 10*3/uL (ref 4.0–10.5)
nRBC: 0 % (ref 0.0–0.2)

## 2021-07-10 MED ORDER — CILGAVIMAB (PART OF EVUSHELD) INJECTION
300.0000 mg | Freq: Once | INTRAMUSCULAR | Status: AC
  Administered 2021-07-10: 300 mg via INTRAMUSCULAR
  Filled 2021-07-10: qty 3

## 2021-07-10 MED ORDER — EPINEPHRINE 0.3 MG/0.3ML IJ SOAJ: 0.3000 mg | Freq: Once | INTRAMUSCULAR | Status: DC | PRN

## 2021-07-10 MED ORDER — ROMIPLOSTIM 125 MCG ~~LOC~~ SOLR
2.0000 ug/kg | Freq: Once | SUBCUTANEOUS | Status: AC
  Administered 2021-07-10: 120 ug via SUBCUTANEOUS
  Filled 2021-07-10: qty 0.24

## 2021-07-10 MED ORDER — TIXAGEVIMAB (PART OF EVUSHELD) INJECTION
300.0000 mg | Freq: Once | INTRAMUSCULAR | Status: AC
  Administered 2021-07-10: 300 mg via INTRAMUSCULAR
  Filled 2021-07-10: qty 3

## 2021-07-10 MED ORDER — SODIUM CHLORIDE 0.9 % IV SOLN
500.0000 mg/m2 | Freq: Once | INTRAVENOUS | Status: AC
  Administered 2021-07-10: 874 mg via INTRAVENOUS
  Filled 2021-07-10: qty 22.99

## 2021-07-10 MED ORDER — FAMOTIDINE 20 MG IN NS 100 ML IVPB
20.0000 mg | Freq: Once | INTRAVENOUS | Status: AC
Start: 2021-07-10 — End: 2021-07-10
  Administered 2021-07-10: 20 mg via INTRAVENOUS
  Filled 2021-07-10: qty 100

## 2021-07-10 MED ORDER — PROCHLORPERAZINE MALEATE 10 MG PO TABS
10.0000 mg | ORAL_TABLET | Freq: Once | ORAL | Status: AC
  Administered 2021-07-10: 10 mg via ORAL
  Filled 2021-07-10: qty 1

## 2021-07-10 MED ORDER — SODIUM CHLORIDE 0.9 % IV SOLN: Freq: Once | INTRAVENOUS | Status: AC

## 2021-07-10 MED ORDER — HEPARIN SOD (PORK) LOCK FLUSH 100 UNIT/ML IV SOLN
500.0000 [IU] | Freq: Once | INTRAVENOUS | Status: AC | PRN
  Administered 2021-07-10: 500 [IU]

## 2021-07-10 MED ORDER — SODIUM CHLORIDE 0.9% FLUSH
10.0000 mL | INTRAVENOUS | Status: DC | PRN
  Administered 2021-07-10: 10 mL

## 2021-07-10 MED ORDER — PACLITAXEL PROTEIN-BOUND CHEMO INJECTION 100 MG
45.0000 mg/m2 | Freq: Once | INTRAVENOUS | Status: AC
  Administered 2021-07-10: 75 mg via INTRAVENOUS
  Filled 2021-07-10: qty 15

## 2021-07-10 NOTE — Progress Notes (Signed)
[facility] OFFICE PROGRESS NOTE   Diagnosis: Pancreas cancer  INTERVAL HISTORY:   Mr. Choyce completed another cycle of gemcitabine/Abraxane on 06/26/2021.  He continues G-CSF and thrombopoietin support.  No fever or rash.  He takes Benadryl and Pepcid for pruritus following chemotherapy.  No abdominal pain.  Objective:  Vital signs in last 24 hours:  Blood pressure (!) 108/59, pulse 70, temperature 98.1 F (36.7 C), temperature source Oral, resp. rate 18, height '5\' 9"'  (1.753 m), weight 132 lb 3.2 oz (60 kg), SpO2 100 %.    HEENT: No thrush or ulcers Resp: Lungs clear bilaterally Cardio: Regular rate and rhythm GI: No hepatosplenomegaly, no mass, nontender Vascular: Trace ankle edema bilaterally  Skin: No rash, Palms without erythema  Portacath/PICC-without erythema  Lab Results:  Lab Results  Component Value Date   WBC 9.6 07/10/2021   HGB 8.7 (L) 07/10/2021   HCT 27.0 (L) 07/10/2021   MCV 97.5 07/10/2021   PLT 89 (L) 07/10/2021   NEUTROABS 7.8 (H) 07/10/2021    CMP  Lab Results  Component Value Date   NA 141 06/26/2021   K 4.1 06/26/2021   CL 114 (H) 06/26/2021   CO2 19 (L) 06/26/2021   GLUCOSE 170 (H) 06/26/2021   BUN 19 06/26/2021   CREATININE 1.20 06/26/2021   CALCIUM 8.6 (L) 06/26/2021   PROT 6.5 06/26/2021   ALBUMIN 3.7 06/26/2021   AST 41 06/26/2021   ALT 51 (H) 06/26/2021   ALKPHOS 231 (H) 06/26/2021   BILITOT 0.8 06/26/2021   GFRNONAA >60 06/26/2021   GFRAA NOT CALCULATED 02/23/2021    Lab Results  Component Value Date   CXK481 2,036 (H) 06/26/2021     Medications: I have reviewed the patient's current medications.   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, mild biliary ductal dilatation, diffuse hypodense/hypoenhancing areas within the liver (edema versus infiltrating neoplasm), 2 x 1.7 cm area of focal prominence in the pancreas EUS 07/10/2020-no pancreas mass identified, 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, 1.5 cm aortocaval node biopsy-scant lymphoid material, no malignancy Diagnostic laparoscopy 07/17/2020-segment 3 and 4 liver nodules, adenocarcinoma, positive for pankeratin, CK7 and CK20, partially positive for TTF-1, negative for CDX2 Foundation 1-microsatellite stable, tumor mutation burden 4, K-ras G12D, subclonal RB1 alteration Elevated CA 19-9 Cycle 1 FOLFOX 07/28/2020 Cycle 2 FOLFOX 08/18/2020, oxaliplatin dose reduced due to thrombocytopenia Cycle 3 FOLFOX 09/08/2020 Cycle 4 FOLFOX 09/29/2020  Cycle 5 FOLFOX 10/20/2020 CTs 11/06/2020-grossly stable infiltrative mass within the pancreatic head and porta hepatis.  New small low-density liver lesions.  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 Cycle 6 FOLFOX 11/10/2021  Cycle 7 FOLFOX 12/01/2020 MRI liver 12/04/2020-no significant change in posttreatment appearance of the pancreatic head.  Numerous intrinsically low signal hypoenhancing lesions of the liver parenchyma predominantly observed in the right lobe of the liver and liver dome, measuring no greater than 8 mm and as seen on recent prior CT of the abdomen/pelvis. Cycle 8 FOLFOX 12/22/2020 Cycle 9 FOLFOX 01/12/2021 Cycle 10 FOLFOX 02/02/2021 MRI liver 02/19/2021-no change in pancreas head mass, multiple hypoenhancing liver lesions  are new and increased in size, effacement of portal vein by pancreas head mass with cavernous transformation, no change in mild splenomegaly Cycle 1 gemcitabine/Abraxane  03/12/2021 Cycle 2 gemcitabine/Abraxane 03/27/2021- dose reductions secondary to neutropenia Cycle 3 gemcitabine/Abraxane 04/17/2021-dose reductions due to thrombocytopenia, white cell growth factor support added Cycle 4 gemcitabine/Abraxane 05/01/2021, Ziextenzo Cycle 5 gemcitabine/Abraxane 05/15/2021, Ziextenzo MRI abdomen 05/26/2021-mild enlargement of dominant hepatic metastases, appearance of infiltrative pancreas mass extending to the celiac trunk and SMA, splenic vein occlusion, cavernous transformation of the portal vein with upper abdomen collaterals, splenomegaly, mild upper abdominal ascites and mesenteric edema, minimal subpleural nodularity left lower lobe Cycle 6 gemcitabine/Abraxane 05/28/2021 Cycle 7 gemcitabine/Abraxane 06/12/2021 Cycle 8 gemcitabine/Abraxane 06/26/2021 Cycle 9 gemcitabine/Abraxane 07/10/2021   Chronic thrombocytopenia- weekly Nplate beginning 01/26/2766 Diabetes Hypertension Abdomen/back pain secondary to #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  He is tolerating the gemcitabine/Abraxane well.  There is no clinical evidence of disease progression.  We will follow-up on the CA 19-9 from today.  He he continues weekly Nplate.  He will return for an office visit and chemotherapy in 2 weeks.  We will plan for a restaging MRI of the abdomen in late September or early October.   Betsy Coder, MD  07/10/2021  8:23 AM

## 2021-07-10 NOTE — Progress Notes (Signed)
Per Dr Benay Spice, ok to treat with platelets 89

## 2021-07-10 NOTE — Patient Instructions (Signed)
[facility]  Discharge Instructions: Thank you for choosing [facility] to provide your oncology and hematology care.   If you have a lab appointment with the [facility], please go directly to the [facility] and check in at the registration area.   Wear comfortable clothing and clothing appropriate for easy access to any Portacath or PICC line.   We strive to give you quality time with your provider. You may need to reschedule your appointment if you arrive late (15 or more minutes).  Arriving late affects you and other patients whose appointments are after yours.  Also, if you miss three or more appointments without notifying the office, you may be dismissed from the clinic at the provider's discretion.      For prescription refill requests, have your pharmacy contact our office and allow 72 hours for refills to be completed.    Today you received the following chemotherapy and/or immunotherapy agents paclitaxel-protein bound, gemcitabine    To help prevent nausea and vomiting after your treatment, we encourage you to take your nausea medication as directed.  BELOW ARE SYMPTOMS THAT SHOULD BE REPORTED IMMEDIATELY: *FEVER GREATER THAN 100.4 F (38 C) OR HIGHER *CHILLS OR SWEATING *NAUSEA AND VOMITING THAT IS NOT CONTROLLED WITH YOUR NAUSEA MEDICATION *UNUSUAL SHORTNESS OF BREATH *UNUSUAL BRUISING OR BLEEDING *URINARY PROBLEMS (pain or burning when urinating, or frequent urination) *BOWEL PROBLEMS (unusual diarrhea, constipation, pain near the anus) TENDERNESS IN MOUTH AND THROAT WITH OR WITHOUT PRESENCE OF ULCERS (sore throat, sores in mouth, or a toothache) UNUSUAL RASH, SWELLING OR PAIN  UNUSUAL VAGINAL DISCHARGE OR ITCHING   Items with * indicate a potential emergency and should be followed up as soon as possible or go to the Emergency Department if any problems should occur.  Please show the CHEMOTHERAPY ALERT CARD or IMMUNOTHERAPY  ALERT CARD at check-in to the Emergency Department and triage nurse.  Should you have questions after your visit or need to cancel or reschedule your appointment, please contact [facility]  Dept: [phone]  and follow the prompts.  Office hours are 8:00 a.m. to 4:30 p.m. Monday - Friday. Please note that voicemails left after 4:00 p.m. may not be returned until the following business day.  We are closed weekends and major holidays. You have access to a nurse at all times for urgent questions. Please call the main number to the clinic Dept: [phone] and follow the prompts.   For any non-urgent questions, you may also contact your provider using MyChart. We now offer e-Visits for anyone 56 and older to request care online for non-urgent symptoms. For details visit [email or web address].   Also download the MyChart app! Go to the app store, search "MyChart", open the app, select [facility], and log in with your MyChart username and password.  Due to Covid, a mask is required upon entering the hospital/clinic. If you do not have a mask, one will be given to you upon arrival. For doctor visits, patients may have 1 support person aged 18 or older with them. For treatment visits, patients cannot have anyone with them due to current Covid guidelines and our immunocompromised population.   Nanoparticle Albumin-Bound Paclitaxel injection What is this medication? NANOPARTICLE ALBUMIN-BOUND PACLITAXEL (Na no PAHR ti kuhl al BYOO muhn-bound PAK li TAX el) is a chemotherapy drug. It targets fast dividing cells, like cancer cells, and causes these cells to die. This medicine is used to treatadvanced breast cancer,  lung cancer, and pancreatic cancer. This medicine may be used for other purposes; ask your health care provider orpharmacist if you have questions. COMMON BRAND NAME(S): Abraxane What should I tell my care team before I take this medication? They need to know if you  have any of these conditions: kidney disease liver disease low blood counts, like low white cell, platelet, or red cell counts lung or breathing disease, like asthma tingling of the fingers or toes, or other nerve disorder an unusual or allergic reaction to paclitaxel, albumin, other chemotherapy, other medicines, foods, dyes, or preservatives pregnant or trying to get pregnant breast-feeding How should I use this medication? This drug is given as an infusion into a vein. It is administered in a hospitalor clinic by a specially trained health care professional. Talk to your pediatrician regarding the use of this medicine in children.Special care may be needed. Overdosage: If you think you have taken too much of this medicine contact apoison control center or emergency room at once. NOTE: This medicine is only for you. Do not share this medicine with others. What if I miss a dose? It is important not to miss your dose. Call your doctor or health careprofessional if you are unable to keep an appointment. What may interact with this medication? This medicine may interact with the following medications: antiviral medicines for hepatitis, HIV or AIDS certain antibiotics like erythromycin and clarithromycin certain medicines for fungal infections like ketoconazole and itraconazole certain medicines for seizures like carbamazepine, phenobarbital, phenytoin gemfibrozil nefazodone rifampin St. John's wort This list may not describe all possible interactions. Give your health care provider a list of all the medicines, herbs, non-prescription drugs, or dietary supplements you use. Also tell them if you smoke, drink alcohol, or use illegaldrugs. Some items may interact with your medicine. What should I watch for while using this medication? Your condition will be monitored carefully while you are receiving this medicine. You will need important blood work done while you are taking thismedicine. This  medicine can cause serious allergic reactions. If you experience allergic reactions like skin rash, itching or hives, swelling of the face, lips, ortongue, tell your doctor or health care professional right away. In some cases, you may be given additional medicines to help with side effects.Follow all directions for their use. This drug may make you feel generally unwell. This is not uncommon, as chemotherapy can affect healthy cells as well as cancer cells. Report any side effects. Continue your course of treatment even though you feel ill unless yourdoctor tells you to stop. Call your doctor or health care professional for advice if you get a fever, chills or sore throat, or other symptoms of a cold or flu. Do not treat yourself. This drug decreases your body's ability to fight infections. Try toavoid being around people who are sick. This medicine may increase your risk to bruise or bleed. Call your doctor orhealth care professional if you notice any unusual bleeding. Be careful brushing and flossing your teeth or using a toothpick because you may get an infection or bleed more easily. If you have any dental work done,tell your dentist you are receiving this medicine. Avoid taking products that contain aspirin, acetaminophen, ibuprofen, naproxen, or ketoprofen unless instructed by your doctor. These medicines may hide afever. Do not become pregnant while taking this medicine or for 6 months after stopping it. Women should inform their doctor if they wish to become pregnant or think they might be pregnant. Men should not father  a child while taking this medicine or for 3 months after stopping it. There is a potential for serious side effects to an unborn child. Talk to your health care professionalor pharmacist for more information. Do not breast-feed an infant while taking this medicine or for 2 weeks afterstopping it. This medicine may interfere with the ability to get pregnant or to father a child. You  should talk to your doctor or health care professional if you areconcerned about your fertility. What side effects may I notice from receiving this medication? Side effects that you should report to your doctor or health care professionalas soon as possible: allergic reactions like skin rash, itching or hives, swelling of the face, lips, or tongue breathing problems changes in vision fast, irregular heartbeat low blood pressure mouth sores pain, tingling, numbness in the hands or feet signs of decreased platelets or bleeding - bruising, pinpoint red spots on the skin, black, tarry stools, blood in the urine signs of decreased red blood cells - unusually weak or tired, feeling faint or lightheaded, falls signs of infection - fever or chills, cough, sore throat, pain or difficulty passing urine signs and symptoms of liver injury like dark yellow or brown urine; general ill feeling or flu-like symptoms; light-colored stools; loss of appetite; nausea; right upper belly pain; unusually weak or tired; yellowing of the eyes or skin swelling of the ankles, feet, hands unusually slow heartbeat Side effects that usually do not require medical attention (report to yourdoctor or health care professional if they continue or are bothersome): diarrhea hair loss loss of appetite nausea, vomiting tiredness This list may not describe all possible side effects. Call your doctor for medical advice about side effects. You may report side effects to FDA at1-800-FDA-1088. Where should I keep my medication? This drug is given in a hospital or clinic and will not be stored at home. NOTE: This sheet is a summary. It may not cover all possible information. If you have questions about this medicine, talk to your doctor, pharmacist, orhealth care provider.  2022 Elsevier/Gold Standard (2017-07-05 13:03:45)  Gemcitabine injection What is this medication? GEMCITABINE (jem SYE ta been) is a chemotherapy drug. This  medicine is used to treat many types of cancer like breast cancer, lung cancer, pancreatic cancer,and ovarian cancer. This medicine may be used for other purposes; ask your health care provider orpharmacist if you have questions. COMMON BRAND NAME(S): Gemzar, Infugem What should I tell my care team before I take this medication? They need to know if you have any of these conditions: blood disorders infection kidney disease liver disease lung or breathing disease, like asthma recent or ongoing radiation therapy an unusual or allergic reaction to gemcitabine, other chemotherapy, other medicines, foods, dyes, or preservatives pregnant or trying to get pregnant breast-feeding How should I use this medication? This drug is given as an infusion into a vein. It is administered in a hospitalor clinic by a specially trained health care professional. Talk to your pediatrician regarding the use of this medicine in children.Special care may be needed. Overdosage: If you think you have taken too much of this medicine contact apoison control center or emergency room at once. NOTE: This medicine is only for you. Do not share this medicine with others. What if I miss a dose? It is important not to miss your dose. Call your doctor or health careprofessional if you are unable to keep an appointment. What may interact with this medication? medicines to increase blood counts like filgrastim,  pegfilgrastim, sargramostim some other chemotherapy drugs like cisplatin vaccines Talk to your doctor or health care professional before taking any of thesemedicines: acetaminophen aspirin ibuprofen ketoprofen naproxen This list may not describe all possible interactions. Give your health care provider a list of all the medicines, herbs, non-prescription drugs, or dietary supplements you use. Also tell them if you smoke, drink alcohol, or use illegaldrugs. Some items may interact with your medicine. What should I  watch for while using this medication? Visit your doctor for checks on your progress. This drug may make you feel generally unwell. This is not uncommon, as chemotherapy can affect healthy cells as well as cancer cells. Report any side effects. Continue your course oftreatment even though you feel ill unless your doctor tells you to stop. In some cases, you may be given additional medicines to help with side effects.Follow all directions for their use. Call your doctor or health care professional for advice if you get a fever, chills or sore throat, or other symptoms of a cold or flu. Do not treat yourself. This drug decreases your body's ability to fight infections. Try toavoid being around people who are sick. This medicine may increase your risk to bruise or bleed. Call your doctor orhealth care professional if you notice any unusual bleeding. Be careful brushing and flossing your teeth or using a toothpick because you may get an infection or bleed more easily. If you have any dental work done,tell your dentist you are receiving this medicine. Avoid taking products that contain aspirin, acetaminophen, ibuprofen, naproxen, or ketoprofen unless instructed by your doctor. These medicines may hide afever. Do not become pregnant while taking this medicine or for 6 months after stopping it. Women should inform their doctor if they wish to become pregnant or think they might be pregnant. Men should not father a child while taking this medicine and for 3 months after stopping it. There is a potential for serious side effects to an unborn child. Talk to your health care professional or pharmacist for more information. Do not breast-feed an infant while takingthis medicine or for at least 1 week after stopping it. Men should inform their doctors if they wish to father a child. This medicine may lower sperm counts. Talk with your doctor or health care professional ifyou are concerned about your fertility. What side  effects may I notice from receiving this medication? Side effects that you should report to your doctor or health care professionalas soon as possible: allergic reactions like skin rash, itching or hives, swelling of the face, lips, or tongue breathing problems pain, redness, or irritation at site where injected signs and symptoms of a dangerous change in heartbeat or heart rhythm like chest pain; dizziness; fast or irregular heartbeat; palpitations; feeling faint or lightheaded, falls; breathing problems signs of decreased platelets or bleeding - bruising, pinpoint red spots on the skin, black, tarry stools, blood in the urine signs of decreased red blood cells - unusually weak or tired, feeling faint or lightheaded, falls signs of infection - fever or chills, cough, sore throat, pain or difficulty passing urine signs and symptoms of kidney injury like trouble passing urine or change in the amount of urine signs and symptoms of liver injury like dark yellow or brown urine; general ill feeling or flu-like symptoms; light-colored stools; loss of appetite; nausea; right upper belly pain; unusually weak or tired; yellowing of the eyes or skin swelling of ankles, feet, hands Side effects that usually do not require medical attention (report  to yourdoctor or health care professional if they continue or are bothersome): constipation diarrhea hair loss loss of appetite nausea rash vomiting This list may not describe all possible side effects. Call your doctor for medical advice about side effects. You may report side effects to FDA at1-800-FDA-1088. Where should I keep my medication? This drug is given in a hospital or clinic and will not be stored at home. NOTE: This sheet is a summary. It may not cover all possible information. If you have questions about this medicine, talk to your doctor, pharmacist, orhealth care provider.  2022 Elsevier/Gold Standard (2018-01-25 18:06:11)  Romiplostim  injection What is this medication? ROMIPLOSTIM (roe mi PLOE stim) helps your body make more platelets. This medicine is used to treat low platelets caused by chronic idiopathic thrombocytopenic purpura (ITP) or a bone marrow syndrome caused by radiationsickness. This medicine may be used for other purposes; ask your health care provider orpharmacist if you have questions. COMMON BRAND NAME(S): Nplate What should I tell my care team before I take this medication? They need to know if you have any of these conditions: blood clots myelodysplastic syndrome an unusual or allergic reaction to romiplostim, mannitol, other medicines, foods, dyes, or preservatives pregnant or trying to get pregnant breast-feeding How should I use this medication? This medicine is injected under the skin. It is given by a health care providerin a hospital or clinic setting. A special MedGuide will be given to you before each treatment. Be sure to readthis information carefully each time. Talk to your health care provider about the use of this medicine in children. While it may be prescribed for children as young as newborns for selectedconditions, precautions do apply. Overdosage: If you think you have taken too much of this medicine contact apoison control center or emergency room at once. NOTE: This medicine is only for you. Do not share this medicine with others. What if I miss a dose? Keep appointments for follow-up doses. It is important not to miss your dose.Call your health care provider if you are unable to keep an appointment. What may interact with this medication? Interactions are not expected. This list may not describe all possible interactions. Give your health care provider a list of all the medicines, herbs, non-prescription drugs, or dietary supplements you use. Also tell them if you smoke, drink alcohol, or use illegaldrugs. Some items may interact with your medicine. What should I watch for while  using this medication? Visit your health care provider for regular checks on your progress. You may need blood work done while you are taking this medicine. Your condition will be monitored carefully while you are receiving this medicine. It is important notto miss any appointments. What side effects may I notice from receiving this medication? Side effects that you should report to your doctor or health care professionalas soon as possible: allergic reactions (skin rash, itching or hives; swelling of the face, lips, or tongue) bleeding (bloody or black, tarry stools; red or dark brown urine; spitting up blood or brown material that looks like coffee grounds; red spots on the skin; unusual bruising or bleeding from the eyes, gums, or nose) blood clot (chest pain; shortness of breath; pain, swelling, or warmth in the leg) stroke (changes in vision; confusion; trouble speaking or understanding; severe headaches; sudden numbness or weakness of the face, arm or leg; trouble walking; dizziness; loss of balance or coordination) Side effects that usually do not require medical attention (report to yourdoctor or health care professional  if they continue or are bothersome): diarrhea dizziness headache joint pain muscle pain stomach pain trouble sleeping This list may not describe all possible side effects. Call your doctor for medical advice about side effects. You may report side effects to FDA at1-800-FDA-1088. Where should I keep my medication? This medicine is given in a hospital or clinic. It will not be stored at home. NOTE: This sheet is a summary. It may not cover all possible information. If you have questions about this medicine, talk to your doctor, pharmacist, orhealth care provider.  2022 Elsevier/Gold Standard (2019-12-17 10:28:13)

## 2021-07-11 ENCOUNTER — Inpatient Hospital Stay: Payer: Medicare Other

## 2021-07-11 VITALS — BP 110/63 | HR 68 | Temp 97.9°F

## 2021-07-11 DIAGNOSIS — C250 Malignant neoplasm of head of pancreas: Secondary | ICD-10-CM

## 2021-07-11 DIAGNOSIS — Z5111 Encounter for antineoplastic chemotherapy: Secondary | ICD-10-CM | POA: Diagnosis not present

## 2021-07-11 LAB — CANCER ANTIGEN 19-9: CA 19-9: 2041 U/mL — ABNORMAL HIGH (ref 0–35)

## 2021-07-11 MED ORDER — PEGFILGRASTIM-BMEZ 6 MG/0.6ML ~~LOC~~ SOSY
PREFILLED_SYRINGE | SUBCUTANEOUS | Status: AC
  Filled 2021-07-11: qty 0.6

## 2021-07-11 MED ORDER — PEGFILGRASTIM-BMEZ 6 MG/0.6ML ~~LOC~~ SOSY
6.0000 mg | PREFILLED_SYRINGE | Freq: Once | SUBCUTANEOUS | Status: AC
  Administered 2021-07-11: 6 mg via SUBCUTANEOUS

## 2021-07-11 NOTE — Patient Instructions (Signed)

## 2021-07-14 ENCOUNTER — Other Ambulatory Visit: Payer: Self-pay | Admitting: Oncology

## 2021-07-14 DIAGNOSIS — C250 Malignant neoplasm of head of pancreas: Secondary | ICD-10-CM

## 2021-07-16 ENCOUNTER — Other Ambulatory Visit: Payer: Self-pay

## 2021-07-16 ENCOUNTER — Inpatient Hospital Stay: Payer: Medicare Other

## 2021-07-16 ENCOUNTER — Inpatient Hospital Stay: Payer: Medicare Other | Attending: Genetic Counselor

## 2021-07-16 VITALS — BP 102/53 | HR 72 | Temp 97.7°F | Resp 18

## 2021-07-16 DIAGNOSIS — C250 Malignant neoplasm of head of pancreas: Secondary | ICD-10-CM | POA: Insufficient documentation

## 2021-07-16 DIAGNOSIS — C787 Secondary malignant neoplasm of liver and intrahepatic bile duct: Secondary | ICD-10-CM | POA: Diagnosis present

## 2021-07-16 DIAGNOSIS — Z95828 Presence of other vascular implants and grafts: Secondary | ICD-10-CM

## 2021-07-16 LAB — CBC WITH DIFFERENTIAL (CANCER CENTER ONLY)
Abs Immature Granulocytes: 0.46 10*3/uL — ABNORMAL HIGH (ref 0.00–0.07)
Basophils Absolute: 0.1 10*3/uL (ref 0.0–0.1)
Basophils Relative: 1 %
Eosinophils Absolute: 0.4 10*3/uL (ref 0.0–0.5)
Eosinophils Relative: 2 %
HCT: 26.6 % — ABNORMAL LOW (ref 39.0–52.0)
Hemoglobin: 8.6 g/dL — ABNORMAL LOW (ref 13.0–17.0)
Immature Granulocytes: 3 %
Lymphocytes Relative: 4 %
Lymphs Abs: 0.8 10*3/uL (ref 0.7–4.0)
MCH: 31.6 pg (ref 26.0–34.0)
MCHC: 32.3 g/dL (ref 30.0–36.0)
MCV: 97.8 fL (ref 80.0–100.0)
Monocytes Absolute: 0.8 10*3/uL (ref 0.1–1.0)
Monocytes Relative: 5 %
Neutro Abs: 15.7 10*3/uL — ABNORMAL HIGH (ref 1.7–7.7)
Neutrophils Relative %: 85 %
Platelet Count: 142 10*3/uL — ABNORMAL LOW (ref 150–400)
RBC: 2.72 MIL/uL — ABNORMAL LOW (ref 4.22–5.81)
RDW: 17.5 % — ABNORMAL HIGH (ref 11.5–15.5)
WBC Count: 18.2 10*3/uL — ABNORMAL HIGH (ref 4.0–10.5)
nRBC: 0 % (ref 0.0–0.2)

## 2021-07-16 MED ORDER — ROMIPLOSTIM 125 MCG ~~LOC~~ SOLR
2.0000 ug/kg | Freq: Once | SUBCUTANEOUS | Status: AC
  Administered 2021-07-16: 120 ug via SUBCUTANEOUS
  Filled 2021-07-16: qty 0.24

## 2021-07-16 MED ORDER — SODIUM CHLORIDE 0.9% FLUSH
10.0000 mL | INTRAVENOUS | Status: DC | PRN
  Administered 2021-07-16: 10 mL

## 2021-07-16 MED ORDER — HEPARIN SOD (PORK) LOCK FLUSH 100 UNIT/ML IV SOLN
500.0000 [IU] | Freq: Once | INTRAVENOUS | Status: AC | PRN
  Administered 2021-07-16: 500 [IU]

## 2021-07-16 NOTE — Patient Instructions (Signed)
Romiplostim injection What is this medication? ROMIPLOSTIM (roe mi PLOE stim) helps your body make more platelets. This medicine is used to treat low platelets caused by chronic idiopathic thrombocytopenic purpura (ITP) or a bone marrow syndrome caused by radiation sickness. This medicine may be used for other purposes; ask your health care provider or pharmacist if you have questions. COMMON BRAND NAME(S): Nplate What should I tell my care team before I take this medication? They need to know if you have any of these conditions: blood clots myelodysplastic syndrome an unusual or allergic reaction to romiplostim, mannitol, other medicines, foods, dyes, or preservatives pregnant or trying to get pregnant breast-feeding How should I use this medication? This medicine is injected under the skin. It is given by a health care provider in a hospital or clinic setting. A special MedGuide will be given to you before each treatment. Be sure to read this information carefully each time. Talk to your health care provider about the use of this medicine in children. While it may be prescribed for children as young as newborns for selected conditions, precautions do apply. Overdosage: If you think you have taken too much of this medicine contact a poison control center or emergency room at once. NOTE: This medicine is only for you. Do not share this medicine with others. What if I miss a dose? Keep appointments for follow-up doses. It is important not to miss your dose. Call your health care provider if you are unable to keep an appointment. What may interact with this medication? Interactions are not expected. This list may not describe all possible interactions. Give your health care provider a list of all the medicines, herbs, non-prescription drugs, or dietary supplements you use. Also tell them if you smoke, drink alcohol, or use illegal drugs. Some items may interact with your medicine. What should I  watch for while using this medication? Visit your health care provider for regular checks on your progress. You may need blood work done while you are taking this medicine. Your condition will be monitored carefully while you are receiving this medicine. It is important not to miss any appointments. What side effects may I notice from receiving this medication? Side effects that you should report to your doctor or health care professional as soon as possible: allergic reactions (skin rash, itching or hives; swelling of the face, lips, or tongue) bleeding (bloody or black, tarry stools; red or dark brown urine; spitting up blood or brown material that looks like coffee grounds; red spots on the skin; unusual bruising or bleeding from the eyes, gums, or nose) blood clot (chest pain; shortness of breath; pain, swelling, or warmth in the leg) stroke (changes in vision; confusion; trouble speaking or understanding; severe headaches; sudden numbness or weakness of the face, arm or leg; trouble walking; dizziness; loss of balance or coordination) Side effects that usually do not require medical attention (report to your doctor or health care professional if they continue or are bothersome): diarrhea dizziness headache joint pain muscle pain stomach pain trouble sleeping This list may not describe all possible side effects. Call your doctor for medical advice about side effects. You may report side effects to FDA at 1-800-FDA-1088. Where should I keep my medication? This medicine is given in a hospital or clinic. It will not be stored at home. NOTE: This sheet is a summary. It may not cover all possible information. If you have questions about this medicine, talk to your doctor, pharmacist, or health care provider.    2022 Elsevier/Gold Standard (2019-12-17 10:28:13)  

## 2021-07-20 ENCOUNTER — Other Ambulatory Visit: Payer: Self-pay | Admitting: Oncology

## 2021-07-23 ENCOUNTER — Inpatient Hospital Stay: Payer: Medicare Other

## 2021-07-23 ENCOUNTER — Other Ambulatory Visit: Payer: Self-pay

## 2021-07-23 ENCOUNTER — Inpatient Hospital Stay (HOSPITAL_BASED_OUTPATIENT_CLINIC_OR_DEPARTMENT_OTHER): Payer: Medicare Other | Admitting: Oncology

## 2021-07-23 VITALS — BP 130/70 | HR 71 | Temp 98.1°F | Resp 18 | Ht 69.0 in | Wt 132.2 lb

## 2021-07-23 DIAGNOSIS — C250 Malignant neoplasm of head of pancreas: Secondary | ICD-10-CM

## 2021-07-23 DIAGNOSIS — Z95828 Presence of other vascular implants and grafts: Secondary | ICD-10-CM

## 2021-07-23 LAB — CBC WITH DIFFERENTIAL (CANCER CENTER ONLY)
Abs Immature Granulocytes: 0.3 10*3/uL — ABNORMAL HIGH (ref 0.00–0.07)
Basophils Absolute: 0.1 10*3/uL (ref 0.0–0.1)
Basophils Relative: 1 %
Eosinophils Absolute: 0.4 10*3/uL (ref 0.0–0.5)
Eosinophils Relative: 3 %
HCT: 26.7 % — ABNORMAL LOW (ref 39.0–52.0)
Hemoglobin: 8.5 g/dL — ABNORMAL LOW (ref 13.0–17.0)
Immature Granulocytes: 2 %
Lymphocytes Relative: 6 %
Lymphs Abs: 0.9 10*3/uL (ref 0.7–4.0)
MCH: 31.3 pg (ref 26.0–34.0)
MCHC: 31.8 g/dL (ref 30.0–36.0)
MCV: 98.2 fL (ref 80.0–100.0)
Monocytes Absolute: 0.7 10*3/uL (ref 0.1–1.0)
Monocytes Relative: 5 %
Neutro Abs: 11.7 10*3/uL — ABNORMAL HIGH (ref 1.7–7.7)
Neutrophils Relative %: 83 %
Platelet Count: 126 10*3/uL — ABNORMAL LOW (ref 150–400)
RBC: 2.72 MIL/uL — ABNORMAL LOW (ref 4.22–5.81)
RDW: 18.1 % — ABNORMAL HIGH (ref 11.5–15.5)
WBC Count: 14 10*3/uL — ABNORMAL HIGH (ref 4.0–10.5)
nRBC: 0 % (ref 0.0–0.2)

## 2021-07-23 LAB — CMP (CANCER CENTER ONLY)
ALT: 39 U/L (ref 0–44)
AST: 25 U/L (ref 15–41)
Albumin: 3.8 g/dL (ref 3.5–5.0)
Alkaline Phosphatase: 247 U/L — ABNORMAL HIGH (ref 38–126)
Anion gap: 9 (ref 5–15)
BUN: 21 mg/dL (ref 8–23)
CO2: 19 mmol/L — ABNORMAL LOW (ref 22–32)
Calcium: 8.6 mg/dL — ABNORMAL LOW (ref 8.9–10.3)
Chloride: 109 mmol/L (ref 98–111)
Creatinine: 1.39 mg/dL — ABNORMAL HIGH (ref 0.61–1.24)
GFR, Estimated: 54 mL/min — ABNORMAL LOW (ref >60)
Glucose, Bld: 186 mg/dL — ABNORMAL HIGH (ref 70–99)
Potassium: 3.8 mmol/L (ref 3.5–5.1)
Sodium: 137 mmol/L (ref 135–145)
Total Bilirubin: 0.8 mg/dL (ref 0.3–1.2)
Total Protein: 6.6 g/dL (ref 6.5–8.1)

## 2021-07-23 MED ORDER — SODIUM CHLORIDE 0.9% FLUSH
10.0000 mL | INTRAVENOUS | Status: DC | PRN
  Administered 2021-07-23: 10 mL

## 2021-07-23 MED ORDER — PACLITAXEL PROTEIN-BOUND CHEMO INJECTION 100 MG
45.0000 mg/m2 | Freq: Once | INTRAVENOUS | Status: AC
  Administered 2021-07-23: 75 mg via INTRAVENOUS
  Filled 2021-07-23: qty 15

## 2021-07-23 MED ORDER — FAMOTIDINE 20 MG IN NS 100 ML IVPB
20.0000 mg | Freq: Once | INTRAVENOUS | Status: AC
  Administered 2021-07-23: 20 mg via INTRAVENOUS
  Filled 2021-07-23: qty 100

## 2021-07-23 MED ORDER — ROMIPLOSTIM 125 MCG ~~LOC~~ SOLR
2.0000 ug/kg | Freq: Once | SUBCUTANEOUS | Status: AC
  Administered 2021-07-23: 120 ug via SUBCUTANEOUS
  Filled 2021-07-23: qty 0.24

## 2021-07-23 MED ORDER — HEPARIN SOD (PORK) LOCK FLUSH 100 UNIT/ML IV SOLN
500.0000 [IU] | Freq: Once | INTRAVENOUS | Status: AC | PRN
  Administered 2021-07-23: 500 [IU]

## 2021-07-23 MED ORDER — SODIUM CHLORIDE 0.9 % IV SOLN
500.0000 mg/m2 | Freq: Once | INTRAVENOUS | Status: AC
  Administered 2021-07-23: 874 mg via INTRAVENOUS
  Filled 2021-07-23: qty 22.99

## 2021-07-23 MED ORDER — SODIUM CHLORIDE 0.9 % IV SOLN: Freq: Once | INTRAVENOUS | Status: AC

## 2021-07-23 MED ORDER — PROCHLORPERAZINE MALEATE 10 MG PO TABS
10.0000 mg | ORAL_TABLET | Freq: Once | ORAL | Status: AC
  Administered 2021-07-23: 10 mg via ORAL
  Filled 2021-07-23: qty 1

## 2021-07-23 NOTE — Progress Notes (Signed)
Patient presents for treatment. RN assessment completed along with the following:  Labs reviewed - YES Vitals reviewed including weight - YES Oncology Treatment Attestation completed - YES Consent completed and reflects current therapy/intent - YES MD/APP progress note reviewed - YES Treatment plan/orders reviewed - YES Last treatment date reviewed and appropriate      amount of time has elapsed between treatments.   Patient to proceed with treatment.

## 2021-07-23 NOTE — Progress Notes (Signed)
Aaron Johnston OFFICE PROGRESS NOTE   Diagnosis: Pancreas cancer  INTERVAL HISTORY:   Aaron Johnston returns as scheduled.  He completed another treat with gemcitabine/Abraxane on 07/10/2021.  No fever, rash, or neuropathy symptoms.  No pain.  He has noted a "knot "in the right lower abdomen for the past 2 weeks.  No associated pain.  He has a good appetite.  He is working.  He continues Nplate.  He reports developing malaise after receiving Evusheld.  Objective:  Vital signs in last 24 hours:  Blood pressure 130/70, pulse 71, temperature 98.1 F (36.7 C), temperature source Oral, resp. rate 18, height '5\' 9"'  (1.753 m), weight 132 lb 3.2 oz (60 kg), SpO2 100 %.    HEENT: No thrush or ulcers Resp: Lungs clear bilaterally Cardio: Regular rate and rhythm GI: No hepatosplenomegaly, reducible high right inguinal hernia, nontender Vascular: The left lower leg is slightly larger than the right side, no edema   Portacath/PICC-without erythema  Lab Results:  Lab Results  Component Value Date   WBC 14.0 (H) 07/23/2021   HGB 8.5 (L) 07/23/2021   HCT 26.7 (L) 07/23/2021   MCV 98.2 07/23/2021   PLT 126 (L) 07/23/2021   NEUTROABS 11.7 (H) 07/23/2021    CMP  Lab Results  Component Value Date   NA 139 07/10/2021   K 3.3 (L) 07/10/2021   CL 112 (H) 07/10/2021   CO2 18 (L) 07/10/2021   GLUCOSE 120 (H) 07/10/2021   BUN 25 (H) 07/10/2021   CREATININE 1.30 (H) 07/10/2021   CALCIUM 8.6 (L) 07/10/2021   PROT 6.4 (L) 07/10/2021   ALBUMIN 3.6 07/10/2021   AST 21 07/10/2021   ALT 33 07/10/2021   ALKPHOS 226 (H) 07/10/2021   BILITOT 0.9 07/10/2021   GFRNONAA 58 (L) 07/10/2021   GFRAA NOT CALCULATED 02/23/2021    Lab Results  Component Value Date   KXF818 2,041 (H) 07/10/2021     Medications: I have reviewed the patient's current medications.   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, mild biliary ductal dilatation, diffuse hypodense/hypoenhancing areas within the liver (edema versus infiltrating neoplasm), 2 x 1.7 cm area of focal prominence in the pancreas EUS 07/10/2020-no pancreas mass identified, 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, 1.5 cm aortocaval node biopsy-scant lymphoid material, no malignancy Diagnostic laparoscopy 07/17/2020-segment 3 and 4 liver nodules, adenocarcinoma, positive for pankeratin, CK7 and CK20, partially positive for TTF-1, negative for CDX2 Foundation 1-microsatellite stable, tumor mutation burden 4, K-ras G12D, subclonal RB1 alteration Elevated CA 19-9 Cycle 1 FOLFOX 07/28/2020 Cycle 2 FOLFOX 08/18/2020, oxaliplatin dose reduced due to thrombocytopenia Cycle 3 FOLFOX 09/08/2020 Cycle 4 FOLFOX 09/29/2020  Cycle 5 FOLFOX 10/20/2020 CTs 11/06/2020-grossly stable infiltrative mass within the pancreatic head and porta hepatis.  New small low-density liver lesions.  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 Cycle 6 FOLFOX 11/10/2021  Cycle 7 FOLFOX 12/01/2020 MRI liver 12/04/2020-no significant change in posttreatment appearance of the pancreatic head.  Numerous intrinsically low signal hypoenhancing lesions of the liver parenchyma predominantly observed in the right lobe of the liver and liver  dome, measuring no greater than 8 mm and as seen on recent prior CT of the abdomen/pelvis. Cycle 8 FOLFOX 12/22/2020 Cycle 9 FOLFOX 01/12/2021 Cycle 10 FOLFOX 02/02/2021 MRI liver 02/19/2021-no change in pancreas head mass, multiple hypoenhancing liver  lesions are new and increased in size, effacement of portal vein by pancreas head mass with cavernous transformation, no change in mild splenomegaly Cycle 1 gemcitabine/Abraxane 03/12/2021 Cycle 2 gemcitabine/Abraxane 03/27/2021- dose reductions secondary to neutropenia Cycle 3 gemcitabine/Abraxane 04/17/2021-dose reductions due to thrombocytopenia, white cell growth factor support added Cycle 4 gemcitabine/Abraxane 05/01/2021, Ziextenzo Cycle 5 gemcitabine/Abraxane 05/15/2021, Ziextenzo MRI abdomen 05/26/2021-mild enlargement of dominant hepatic metastases, appearance of infiltrative pancreas mass extending to the celiac trunk and SMA, splenic vein occlusion, cavernous transformation of the portal vein with upper abdomen collaterals, splenomegaly, mild upper abdominal ascites and mesenteric edema, minimal subpleural nodularity left lower lobe Cycle 6 gemcitabine/Abraxane 05/28/2021 Cycle 7 gemcitabine/Abraxane 06/12/2021 Cycle 8 gemcitabine/Abraxane 06/26/2021 Cycle 9 gemcitabine/Abraxane 07/10/2021 Cycle 10 gemcitabine/Abraxane 07/23/2021   Chronic thrombocytopenia- weekly Nplate beginning 2/77/4128 Diabetes Hypertension Abdomen/back pain 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 appears well.  He continues to tolerate the gemcitabine/Abraxane well.  He is stable from a hematologic standpoint.  He will complete another cycle of chemotherapy today.  The plan will be for a restaging MRI in late September.  [name] has a reducible right inguinal hernia.  He will continue weekly Nplate.  [name] will return for an office visit and chemotherapy in 2 weeks.  Betsy Coder, MD  07/23/2021  8:40 AM

## 2021-07-23 NOTE — Patient Instructions (Addendum)
[facility]   Discharge Instructions: Thank you for choosing [facility] to provide your oncology and hematology care.   If you have a lab appointment with the [facility], please go directly to the [facility] and check in at the registration area.   Wear comfortable clothing and clothing appropriate for easy access to any Portacath or PICC line.   We strive to give you quality time with your provider. You may need to reschedule your appointment if you arrive late (15 or more minutes).  Arriving late affects you and other patients whose appointments are after yours.  Also, if you miss three or more appointments without notifying the office, you may be dismissed from the clinic at the provider's discretion.      For prescription refill requests, have your pharmacy contact our office and allow 72 hours for refills to be completed.    Today you received the following chemotherapy and/or immunotherapy agents Paclitaxel-protein bound (ABRAXANE) & Gemcitabine (GEMZAR).      To help prevent nausea and vomiting after your treatment, we encourage you to take your nausea medication as directed.  BELOW ARE SYMPTOMS THAT SHOULD BE REPORTED IMMEDIATELY: *FEVER GREATER THAN 100.4 F (38 C) OR HIGHER *CHILLS OR SWEATING *NAUSEA AND VOMITING THAT IS NOT CONTROLLED WITH YOUR NAUSEA MEDICATION *UNUSUAL SHORTNESS OF BREATH *UNUSUAL BRUISING OR BLEEDING *URINARY PROBLEMS (pain or burning when urinating, or frequent urination) *BOWEL PROBLEMS (unusual diarrhea, constipation, pain near the anus) TENDERNESS IN MOUTH AND THROAT WITH OR WITHOUT PRESENCE OF ULCERS (sore throat, sores in mouth, or a toothache) UNUSUAL RASH, SWELLING OR PAIN  UNUSUAL VAGINAL DISCHARGE OR ITCHING   Items with * indicate a potential emergency and should be followed up as soon as possible or go to the Emergency Department if any problems should occur.  Please show the CHEMOTHERAPY ALERT  CARD or IMMUNOTHERAPY ALERT CARD at check-in to the Emergency Department and triage nurse.  Should you have questions after your visit or need to cancel or reschedule your appointment, please contact [facility]  Dept: [phone]  and follow the prompts.  Office hours are 8:00 a.m. to 4:30 p.m. Monday - Friday. Please note that voicemails left after 4:00 p.m. may not be returned until the following business day.  We are closed weekends and major holidays. You have access to a nurse at all times for urgent questions. Please call the main number to the clinic Dept: [phone] and follow the prompts.   For any non-urgent questions, you may also contact your provider using MyChart. We now offer e-Visits for anyone 26 and older to request care online for non-urgent symptoms. For details visit [email or web address].   Also download the MyChart app! Go to the app store, search "MyChart", open the app, select [facility], and log in with your MyChart username and password.  Due to Covid, a mask is required upon entering the hospital/clinic. If you do not have a mask, one will be given to you upon arrival. For doctor visits, patients may have 1 support person aged 35 or older with them. For treatment visits, patients cannot have anyone with them due to current Covid guidelines and our immunocompromised population.   Nanoparticle Albumin-Bound Paclitaxel injection What is this medication? NANOPARTICLE ALBUMIN-BOUND PACLITAXEL (Na no PAHR ti kuhl al BYOO muhn-bound PAK li TAX el) is a chemotherapy drug. It targets fast dividing cells, like cancer cells, and causes these cells to die. This medicine  is used to treat advanced breast cancer, lung cancer, and pancreatic cancer. This medicine may be used for other purposes; ask your health care provider or pharmacist if you have questions. COMMON BRAND NAME(S): Abraxane What should I tell my care team before I take this  medication? They need to know if you have any of these conditions: kidney disease liver disease low blood counts, like low white cell, platelet, or red cell counts lung or breathing disease, like asthma tingling of the fingers or toes, or other nerve disorder an unusual or allergic reaction to paclitaxel, albumin, other chemotherapy, other medicines, foods, dyes, or preservatives pregnant or trying to get pregnant breast-feeding How should I use this medication? This drug is given as an infusion into a vein. It is administered in a hospital or clinic by a specially trained health care professional. Talk to your pediatrician regarding the use of this medicine in children. Special care may be needed. Overdosage: If you think you have taken too much of this medicine contact a poison control center or emergency room at once. NOTE: This medicine is only for you. Do not share this medicine with others. What if I miss a dose? It is important not to miss your dose. Call your doctor or health care professional if you are unable to keep an appointment. What may interact with this medication? This medicine may interact with the following medications: antiviral medicines for hepatitis, HIV or AIDS certain antibiotics like erythromycin and clarithromycin certain medicines for fungal infections like ketoconazole and itraconazole certain medicines for seizures like carbamazepine, phenobarbital, phenytoin gemfibrozil nefazodone rifampin St. John's wort This list may not describe all possible interactions. Give your health care provider a list of all the medicines, herbs, non-prescription drugs, or dietary supplements you use. Also tell them if you smoke, drink alcohol, or use illegal drugs. Some items may interact with your medicine. What should I watch for while using this medication? Your condition will be monitored carefully while you are receiving this medicine. You will need important blood work  done while you are taking this medicine. This medicine can cause serious allergic reactions. If you experience allergic reactions like skin rash, itching or hives, swelling of the face, lips, or tongue, tell your doctor or health care professional right away. In some cases, you may be given additional medicines to help with side effects. Follow all directions for their use. This drug may make you feel generally unwell. This is not uncommon, as chemotherapy can affect healthy cells as well as cancer cells. Report any side effects. Continue your course of treatment even though you feel ill unless your doctor tells you to stop. Call your doctor or health care professional for advice if you get a fever, chills or sore throat, or other symptoms of a cold or flu. Do not treat yourself. This drug decreases your body's ability to fight infections. Try to avoid being around people who are sick. This medicine may increase your risk to bruise or bleed. Call your doctor or health care professional if you notice any unusual bleeding. Be careful brushing and flossing your teeth or using a toothpick because you may get an infection or bleed more easily. If you have any dental work done, tell your dentist you are receiving this medicine. Avoid taking products that contain aspirin, acetaminophen, ibuprofen, naproxen, or ketoprofen unless instructed by your doctor. These medicines may hide a fever. Do not become pregnant while taking this medicine or for 6 months after stopping it.  Women should inform their doctor if they wish to become pregnant or think they might be pregnant. Men should not father a child while taking this medicine or for 3 months after stopping it. There is a potential for serious side effects to an unborn child. Talk to your health care professional or pharmacist for more information. Do not breast-feed an infant while taking this medicine or for 2 weeks after stopping it. This medicine may interfere  with the ability to get pregnant or to father a child. You should talk to your doctor or health care professional if you are concerned about your fertility. What side effects may I notice from receiving this medication? Side effects that you should report to your doctor or health care professional as soon as possible: allergic reactions like skin rash, itching or hives, swelling of the face, lips, or tongue breathing problems changes in vision fast, irregular heartbeat low blood pressure mouth sores pain, tingling, numbness in the hands or feet signs of decreased platelets or bleeding - bruising, pinpoint red spots on the skin, black, tarry stools, blood in the urine signs of decreased red blood cells - unusually weak or tired, feeling faint or lightheaded, falls signs of infection - fever or chills, cough, sore throat, pain or difficulty passing urine signs and symptoms of liver injury like dark yellow or brown urine; general ill feeling or flu-like symptoms; light-colored stools; loss of appetite; nausea; right upper belly pain; unusually weak or tired; yellowing of the eyes or skin swelling of the ankles, feet, hands unusually slow heartbeat Side effects that usually do not require medical attention (report to your doctor or health care professional if they continue or are bothersome): diarrhea hair loss loss of appetite nausea, vomiting tiredness This list may not describe all possible side effects. Call your doctor for medical advice about side effects. You may report side effects to FDA at 1-800-FDA-1088. Where should I keep my medication? This drug is given in a hospital or clinic and will not be stored at home. NOTE: This sheet is a summary. It may not cover all possible information. If you have questions about this medicine, talk to your doctor, pharmacist, or health care provider.  2022 Elsevier/Gold Standard (2017-07-05 13:03:45)  Gemcitabine injection What is this  medication? GEMCITABINE (jem SYE ta been) is a chemotherapy drug. This medicine is used to treat many types of cancer like breast cancer, lung cancer, pancreatic cancer, and ovarian cancer. This medicine may be used for other purposes; ask your health care provider or pharmacist if you have questions. COMMON BRAND NAME(S): Gemzar, Infugem What should I tell my care team before I take this medication? They need to know if you have any of these conditions: blood disorders infection kidney disease liver disease lung or breathing disease, like asthma recent or ongoing radiation therapy an unusual or allergic reaction to gemcitabine, other chemotherapy, other medicines, foods, dyes, or preservatives pregnant or trying to get pregnant breast-feeding How should I use this medication? This drug is given as an infusion into a vein. It is administered in a hospital or clinic by a specially trained health care professional. Talk to your pediatrician regarding the use of this medicine in children. Special care may be needed. Overdosage: If you think you have taken too much of this medicine contact a poison control center or emergency room at once. NOTE: This medicine is only for you. Do not share this medicine with others. What if I miss a dose? It is  important not to miss your dose. Call your doctor or health care professional if you are unable to keep an appointment. What may interact with this medication? medicines to increase blood counts like filgrastim, pegfilgrastim, sargramostim some other chemotherapy drugs like cisplatin vaccines Talk to your doctor or health care professional before taking any of these medicines: acetaminophen aspirin ibuprofen ketoprofen naproxen This list may not describe all possible interactions. Give your health care provider a list of all the medicines, herbs, non-prescription drugs, or dietary supplements you use. Also tell them if you smoke, drink alcohol, or  use illegal drugs. Some items may interact with your medicine. What should I watch for while using this medication? Visit your doctor for checks on your progress. This drug may make you feel generally unwell. This is not uncommon, as chemotherapy can affect healthy cells as well as cancer cells. Report any side effects. Continue your course of treatment even though you feel ill unless your doctor tells you to stop. In some cases, you may be given additional medicines to help with side effects. Follow all directions for their use. Call your doctor or health care professional for advice if you get a fever, chills or sore throat, or other symptoms of a cold or flu. Do not treat yourself. This drug decreases your body's ability to fight infections. Try to avoid being around people who are sick. This medicine may increase your risk to bruise or bleed. Call your doctor or health care professional if you notice any unusual bleeding. Be careful brushing and flossing your teeth or using a toothpick because you may get an infection or bleed more easily. If you have any dental work done, tell your dentist you are receiving this medicine. Avoid taking products that contain aspirin, acetaminophen, ibuprofen, naproxen, or ketoprofen unless instructed by your doctor. These medicines may hide a fever. Do not become pregnant while taking this medicine or for 6 months after stopping it. Women should inform their doctor if they wish to become pregnant or think they might be pregnant. Men should not father a child while taking this medicine and for 3 months after stopping it. There is a potential for serious side effects to an unborn child. Talk to your health care professional or pharmacist for more information. Do not breast-feed an infant while taking this medicine or for at least 1 week after stopping it. Men should inform their doctors if they wish to father a child. This medicine may lower sperm counts. Talk with your  doctor or health care professional if you are concerned about your fertility. What side effects may I notice from receiving this medication? Side effects that you should report to your doctor or health care professional as soon as possible: allergic reactions like skin rash, itching or hives, swelling of the face, lips, or tongue breathing problems pain, redness, or irritation at site where injected signs and symptoms of a dangerous change in heartbeat or heart rhythm like chest pain; dizziness; fast or irregular heartbeat; palpitations; feeling faint or lightheaded, falls; breathing problems signs of decreased platelets or bleeding - bruising, pinpoint red spots on the skin, black, tarry stools, blood in the urine signs of decreased red blood cells - unusually weak or tired, feeling faint or lightheaded, falls signs of infection - fever or chills, cough, sore throat, pain or difficulty passing urine signs and symptoms of kidney injury like trouble passing urine or change in the amount of urine signs and symptoms of liver injury like dark  yellow or brown urine; general ill feeling or flu-like symptoms; light-colored stools; loss of appetite; nausea; right upper belly pain; unusually weak or tired; yellowing of the eyes or skin swelling of ankles, feet, hands Side effects that usually do not require medical attention (report to your doctor or health care professional if they continue or are bothersome): constipation diarrhea hair loss loss of appetite nausea rash vomiting This list may not describe all possible side effects. Call your doctor for medical advice about side effects. You may report side effects to FDA at 1-800-FDA-1088. Where should I keep my medication? This drug is given in a hospital or clinic and will not be stored at home. NOTE: This sheet is a summary. It may not cover all possible information. If you have questions about this medicine, talk to your doctor, pharmacist, or  health care provider.  2022 Elsevier/Gold Standard (2018-01-25 18:06:11)  Romiplostim injection What is this medication? ROMIPLOSTIM (roe mi PLOE stim) helps your body make more platelets. This medicine is used to treat low platelets caused by chronic idiopathic thrombocytopenic purpura (ITP) or a bone marrow syndrome caused by radiation sickness. This medicine may be used for other purposes; ask your health care provider or pharmacist if you have questions. COMMON BRAND NAME(S): Nplate What should I tell my care team before I take this medication? They need to know if you have any of these conditions: blood clots myelodysplastic syndrome an unusual or allergic reaction to romiplostim, mannitol, other medicines, foods, dyes, or preservatives pregnant or trying to get pregnant breast-feeding How should I use this medication? This medicine is injected under the skin. It is given by a health care provider in a hospital or clinic setting. A special MedGuide will be given to you before each treatment. Be sure to read this information carefully each time. Talk to your health care provider about the use of this medicine in children. While it may be prescribed for children as young as newborns for selected conditions, precautions do apply. Overdosage: If you think you have taken too much of this medicine contact a poison control center or emergency room at once. NOTE: This medicine is only for you. Do not share this medicine with others. What if I miss a dose? Keep appointments for follow-up doses. It is important not to miss your dose. Call your health care provider if you are unable to keep an appointment. What may interact with this medication? Interactions are not expected. This list may not describe all possible interactions. Give your health care provider a list of all the medicines, herbs, non-prescription drugs, or dietary supplements you use. Also tell them if you smoke, drink alcohol, or  use illegal drugs. Some items may interact with your medicine. What should I watch for while using this medication? Visit your health care provider for regular checks on your progress. You may need blood work done while you are taking this medicine. Your condition will be monitored carefully while you are receiving this medicine. It is important not to miss any appointments. What side effects may I notice from receiving this medication? Side effects that you should report to your doctor or health care professional as soon as possible: allergic reactions (skin rash, itching or hives; swelling of the face, lips, or tongue) bleeding (bloody or black, tarry stools; red or dark brown urine; spitting up blood or brown material that looks like coffee grounds; red spots on the skin; unusual bruising or bleeding from the eyes, gums, or nose) blood  clot (chest pain; shortness of breath; pain, swelling, or warmth in the leg) stroke (changes in vision; confusion; trouble speaking or understanding; severe headaches; sudden numbness or weakness of the face, arm or leg; trouble walking; dizziness; loss of balance or coordination) Side effects that usually do not require medical attention (report to your doctor or health care professional if they continue or are bothersome): diarrhea dizziness headache joint pain muscle pain stomach pain trouble sleeping This list may not describe all possible side effects. Call your doctor for medical advice about side effects. You may report side effects to FDA at 1-800-FDA-1088. Where should I keep my medication? This medicine is given in a hospital or clinic. It will not be stored at home. NOTE: This sheet is a summary. It may not cover all possible information. If you have questions about this medicine, talk to your doctor, pharmacist, or health care provider.  2022 Elsevier/Gold Standard (2019-12-17 10:28:13)

## 2021-07-24 ENCOUNTER — Other Ambulatory Visit: Payer: Self-pay

## 2021-07-24 ENCOUNTER — Ambulatory Visit: Payer: Self-pay

## 2021-07-24 ENCOUNTER — Ambulatory Visit: Payer: Self-pay | Admitting: Oncology

## 2021-07-24 ENCOUNTER — Inpatient Hospital Stay: Payer: Medicare Other

## 2021-07-24 VITALS — BP 121/60 | HR 66 | Temp 98.0°F | Resp 20

## 2021-07-24 DIAGNOSIS — C250 Malignant neoplasm of head of pancreas: Secondary | ICD-10-CM | POA: Diagnosis not present

## 2021-07-24 LAB — CANCER ANTIGEN 19-9: CA 19-9: 2441 U/mL — ABNORMAL HIGH (ref 0–35)

## 2021-07-24 MED ORDER — PEGFILGRASTIM-BMEZ 6 MG/0.6ML ~~LOC~~ SOSY
6.0000 mg | PREFILLED_SYRINGE | Freq: Once | SUBCUTANEOUS | Status: AC
  Administered 2021-07-24: 6 mg via SUBCUTANEOUS

## 2021-07-24 NOTE — Patient Instructions (Signed)

## 2021-07-25 ENCOUNTER — Inpatient Hospital Stay: Payer: Medicare Other

## 2021-07-31 ENCOUNTER — Other Ambulatory Visit: Payer: Self-pay

## 2021-07-31 ENCOUNTER — Telehealth: Payer: Self-pay

## 2021-07-31 ENCOUNTER — Inpatient Hospital Stay: Payer: Medicare Other

## 2021-07-31 VITALS — BP 109/53 | HR 71 | Temp 98.0°F | Resp 18

## 2021-07-31 DIAGNOSIS — C250 Malignant neoplasm of head of pancreas: Secondary | ICD-10-CM | POA: Diagnosis not present

## 2021-07-31 DIAGNOSIS — Z95828 Presence of other vascular implants and grafts: Secondary | ICD-10-CM

## 2021-07-31 LAB — CBC WITH DIFFERENTIAL (CANCER CENTER ONLY)
Abs Immature Granulocytes: 1.11 10*3/uL — ABNORMAL HIGH (ref 0.00–0.07)
Basophils Absolute: 0.1 10*3/uL (ref 0.0–0.1)
Basophils Relative: 0 %
Eosinophils Absolute: 0.2 10*3/uL (ref 0.0–0.5)
Eosinophils Relative: 1 %
HCT: 24.6 % — ABNORMAL LOW (ref 39.0–52.0)
Hemoglobin: 7.8 g/dL — ABNORMAL LOW (ref 13.0–17.0)
Immature Granulocytes: 5 %
Lymphocytes Relative: 4 %
Lymphs Abs: 0.9 10*3/uL (ref 0.7–4.0)
MCH: 30.8 pg (ref 26.0–34.0)
MCHC: 31.7 g/dL (ref 30.0–36.0)
MCV: 97.2 fL (ref 80.0–100.0)
Monocytes Absolute: 1 10*3/uL (ref 0.1–1.0)
Monocytes Relative: 4 %
Neutro Abs: 19.2 10*3/uL — ABNORMAL HIGH (ref 1.7–7.7)
Neutrophils Relative %: 86 %
Platelet Count: 133 10*3/uL — ABNORMAL LOW (ref 150–400)
RBC: 2.53 MIL/uL — ABNORMAL LOW (ref 4.22–5.81)
RDW: 17.1 % — ABNORMAL HIGH (ref 11.5–15.5)
WBC Count: 22.4 10*3/uL — ABNORMAL HIGH (ref 4.0–10.5)
nRBC: 0 % (ref 0.0–0.2)

## 2021-07-31 MED ORDER — ROMIPLOSTIM 125 MCG ~~LOC~~ SOLR
2.0000 ug/kg | Freq: Once | SUBCUTANEOUS | Status: AC
  Administered 2021-07-31: 120 ug via SUBCUTANEOUS
  Filled 2021-07-31: qty 0.24

## 2021-07-31 MED ORDER — SODIUM CHLORIDE 0.9% FLUSH
10.0000 mL | INTRAVENOUS | Status: DC | PRN
  Administered 2021-07-31: 10 mL

## 2021-07-31 MED ORDER — HEPARIN SOD (PORK) LOCK FLUSH 100 UNIT/ML IV SOLN
500.0000 [IU] | Freq: Once | INTRAVENOUS | Status: AC | PRN
  Administered 2021-07-31: 500 [IU]

## 2021-07-31 NOTE — Patient Instructions (Signed)
Romiplostim injection What is this medication? ROMIPLOSTIM (roe mi PLOE stim) helps your body make more platelets. This medicine is used to treat low platelets caused by chronic idiopathic thrombocytopenic purpura (ITP) or a bone marrow syndrome caused by radiation sickness. This medicine may be used for other purposes; ask your health care provider or pharmacist if you have questions. COMMON BRAND NAME(S): Nplate What should I tell my care team before I take this medication? They need to know if you have any of these conditions: blood clots myelodysplastic syndrome an unusual or allergic reaction to romiplostim, mannitol, other medicines, foods, dyes, or preservatives pregnant or trying to get pregnant breast-feeding How should I use this medication? This medicine is injected under the skin. It is given by a health care provider in a hospital or clinic setting. A special MedGuide will be given to you before each treatment. Be sure to read this information carefully each time. Talk to your health care provider about the use of this medicine in children. While it may be prescribed for children as young as newborns for selected conditions, precautions do apply. Overdosage: If you think you have taken too much of this medicine contact a poison control center or emergency room at once. NOTE: This medicine is only for you. Do not share this medicine with others. What if I miss a dose? Keep appointments for follow-up doses. It is important not to miss your dose. Call your health care provider if you are unable to keep an appointment. What may interact with this medication? Interactions are not expected. This list may not describe all possible interactions. Give your health care provider a list of all the medicines, herbs, non-prescription drugs, or dietary supplements you use. Also tell them if you smoke, drink alcohol, or use illegal drugs. Some items may interact with your medicine. What should I  watch for while using this medication? Visit your health care provider for regular checks on your progress. You may need blood work done while you are taking this medicine. Your condition will be monitored carefully while you are receiving this medicine. It is important not to miss any appointments. What side effects may I notice from receiving this medication? Side effects that you should report to your doctor or health care professional as soon as possible: allergic reactions (skin rash, itching or hives; swelling of the face, lips, or tongue) bleeding (bloody or black, tarry stools; red or dark brown urine; spitting up blood or brown material that looks like coffee grounds; red spots on the skin; unusual bruising or bleeding from the eyes, gums, or nose) blood clot (chest pain; shortness of breath; pain, swelling, or warmth in the leg) stroke (changes in vision; confusion; trouble speaking or understanding; severe headaches; sudden numbness or weakness of the face, arm or leg; trouble walking; dizziness; loss of balance or coordination) Side effects that usually do not require medical attention (report to your doctor or health care professional if they continue or are bothersome): diarrhea dizziness headache joint pain muscle pain stomach pain trouble sleeping This list may not describe all possible side effects. Call your doctor for medical advice about side effects. You may report side effects to FDA at 1-800-FDA-1088. Where should I keep my medication? This medicine is given in a hospital or clinic. It will not be stored at home. NOTE: This sheet is a summary. It may not cover all possible information. If you have questions about this medicine, talk to your doctor, pharmacist, or health care provider.    2022 Elsevier/Gold Standard (2019-12-17 10:28:13)  

## 2021-07-31 NOTE — Telephone Encounter (Signed)
Pt came in today for injection. Pt's wife inquiring about Pt's HGB being 7.8 Pt stated he was not sob a little tired but ok. Discussed with Dr. Benay Spice who stated we will put in a sample for Pt's next visit this week Thursday.Informed Pt if he started to feel SOB to give a call to the office. Pt verbalized understanding. No further problems or concerns noted.

## 2021-08-02 ENCOUNTER — Other Ambulatory Visit: Payer: Self-pay | Admitting: Oncology

## 2021-08-06 ENCOUNTER — Inpatient Hospital Stay: Payer: Medicare Other

## 2021-08-06 ENCOUNTER — Inpatient Hospital Stay (HOSPITAL_BASED_OUTPATIENT_CLINIC_OR_DEPARTMENT_OTHER): Payer: Medicare Other | Admitting: Nurse Practitioner

## 2021-08-06 ENCOUNTER — Encounter: Payer: Self-pay | Admitting: Nurse Practitioner

## 2021-08-06 ENCOUNTER — Other Ambulatory Visit: Payer: Self-pay

## 2021-08-06 VITALS — BP 118/60 | HR 74 | Temp 98.1°F | Resp 18 | Ht 69.0 in | Wt 135.2 lb

## 2021-08-06 DIAGNOSIS — C250 Malignant neoplasm of head of pancreas: Secondary | ICD-10-CM

## 2021-08-06 DIAGNOSIS — Z95828 Presence of other vascular implants and grafts: Secondary | ICD-10-CM

## 2021-08-06 LAB — CMP (CANCER CENTER ONLY)
ALT: 41 U/L (ref 0–44)
AST: 27 U/L (ref 15–41)
Albumin: 3.9 g/dL (ref 3.5–5.0)
Alkaline Phosphatase: 288 U/L — ABNORMAL HIGH (ref 38–126)
Anion gap: 9 (ref 5–15)
BUN: 16 mg/dL (ref 8–23)
CO2: 18 mmol/L — ABNORMAL LOW (ref 22–32)
Calcium: 8.8 mg/dL — ABNORMAL LOW (ref 8.9–10.3)
Chloride: 114 mmol/L — ABNORMAL HIGH (ref 98–111)
Creatinine: 1.21 mg/dL (ref 0.61–1.24)
GFR, Estimated: >60 mL/min (ref >60)
Glucose, Bld: 147 mg/dL — ABNORMAL HIGH (ref 70–99)
Potassium: 3.8 mmol/L (ref 3.5–5.1)
Sodium: 141 mmol/L (ref 135–145)
Total Bilirubin: 0.6 mg/dL (ref 0.3–1.2)
Total Protein: 6.5 g/dL (ref 6.5–8.1)

## 2021-08-06 LAB — CBC WITH DIFFERENTIAL (CANCER CENTER ONLY)
Abs Immature Granulocytes: 0.5 10*3/uL — ABNORMAL HIGH (ref 0.00–0.07)
Basophils Absolute: 0.1 10*3/uL (ref 0.0–0.1)
Basophils Relative: 1 %
Eosinophils Absolute: 0.3 10*3/uL (ref 0.0–0.5)
Eosinophils Relative: 2 %
HCT: 26.2 % — ABNORMAL LOW (ref 39.0–52.0)
Hemoglobin: 8.3 g/dL — ABNORMAL LOW (ref 13.0–17.0)
Immature Granulocytes: 4 %
Lymphocytes Relative: 7 %
Lymphs Abs: 1 10*3/uL (ref 0.7–4.0)
MCH: 31.7 pg (ref 26.0–34.0)
MCHC: 31.7 g/dL (ref 30.0–36.0)
MCV: 100 fL (ref 80.0–100.0)
Monocytes Absolute: 0.6 10*3/uL (ref 0.1–1.0)
Monocytes Relative: 4 %
Neutro Abs: 11.4 10*3/uL — ABNORMAL HIGH (ref 1.7–7.7)
Neutrophils Relative %: 82 %
Platelet Count: 79 10*3/uL — ABNORMAL LOW (ref 150–400)
RBC: 2.62 MIL/uL — ABNORMAL LOW (ref 4.22–5.81)
RDW: 17.7 % — ABNORMAL HIGH (ref 11.5–15.5)
WBC Count: 13.9 10*3/uL — ABNORMAL HIGH (ref 4.0–10.5)
nRBC: 0 % (ref 0.0–0.2)

## 2021-08-06 LAB — MAGNESIUM: Magnesium: 1.9 mg/dL (ref 1.7–2.4)

## 2021-08-06 LAB — TYPE AND SCREEN
ABO/RH(D): A POS
Antibody Screen: NEGATIVE

## 2021-08-06 MED ORDER — SODIUM CHLORIDE 0.9 % IV SOLN: Freq: Once | INTRAVENOUS | Status: AC

## 2021-08-06 MED ORDER — SODIUM CHLORIDE 0.9 % IV SOLN
500.0000 mg/m2 | Freq: Once | INTRAVENOUS | Status: AC
  Administered 2021-08-06: 874 mg via INTRAVENOUS
  Filled 2021-08-06: qty 22.99

## 2021-08-06 MED ORDER — SODIUM CHLORIDE 0.9% FLUSH
10.0000 mL | INTRAVENOUS | Status: DC | PRN
  Administered 2021-08-06: 10 mL

## 2021-08-06 MED ORDER — PROCHLORPERAZINE MALEATE 10 MG PO TABS
10.0000 mg | ORAL_TABLET | Freq: Once | ORAL | Status: AC
  Administered 2021-08-06: 10 mg via ORAL
  Filled 2021-08-06: qty 1

## 2021-08-06 MED ORDER — PACLITAXEL PROTEIN-BOUND CHEMO INJECTION 100 MG
45.0000 mg/m2 | Freq: Once | INTRAVENOUS | Status: AC
  Administered 2021-08-06: 75 mg via INTRAVENOUS
  Filled 2021-08-06: qty 15

## 2021-08-06 MED ORDER — ROMIPLOSTIM 125 MCG ~~LOC~~ SOLR
2.0000 ug/kg | Freq: Once | SUBCUTANEOUS | Status: AC
  Administered 2021-08-06: 125 ug via SUBCUTANEOUS
  Filled 2021-08-06: qty 0.25

## 2021-08-06 MED ORDER — HEPARIN SOD (PORK) LOCK FLUSH 100 UNIT/ML IV SOLN
500.0000 [IU] | Freq: Once | INTRAVENOUS | Status: AC | PRN
  Administered 2021-08-06: 500 [IU]

## 2021-08-06 MED ORDER — FAMOTIDINE 20 MG IN NS 100 ML IVPB
20.0000 mg | Freq: Once | INTRAVENOUS | Status: AC
  Administered 2021-08-06: 20 mg via INTRAVENOUS
  Filled 2021-08-06: qty 100

## 2021-08-06 MED ORDER — PANCRELIPASE (LIP-PROT-AMYL) 36000-114000 UNITS PO CPEP: ORAL_CAPSULE | ORAL | 5 refills | Status: AC

## 2021-08-06 MED ORDER — DIPHENOXYLATE-ATROPINE 2.5-0.025 MG PO TABS: 1.0000 | ORAL_TABLET | Freq: Four times a day (QID) | ORAL | 0 refills | Status: DC | PRN

## 2021-08-06 NOTE — Progress Notes (Signed)
Cbc entered per lab

## 2021-08-06 NOTE — Patient Instructions (Signed)
Aaron Johnston   Discharge Instructions: Thank you for choosing [facility] to provide your oncology and hematology care.   If you have a lab appointment with the [facility], please go directly to the [facility] and check in at the registration area.   Wear comfortable clothing and clothing appropriate for easy access to any Portacath or PICC line.   We strive to give you quality time with your provider. You may need to reschedule your appointment if you arrive late (15 or more minutes).  Arriving late affects you and other patients whose appointments are after yours.  Also, if you miss three or more appointments without notifying the office, you may be dismissed from the clinic at the provider's discretion.      For prescription refill requests, have your pharmacy contact our office and allow 72 hours for refills to be completed.    Today you received the following chemotherapy and/or immunotherapy agents Paclitaxel-protein bound (ABRAXANE) & Gemcitabine (GEMZAR).      To help prevent nausea and vomiting after your treatment, we encourage you to take your nausea medication as directed.  BELOW ARE SYMPTOMS THAT SHOULD BE REPORTED IMMEDIATELY: *FEVER GREATER THAN 100.4 F (38 C) OR HIGHER *CHILLS OR SWEATING *NAUSEA AND VOMITING THAT IS NOT CONTROLLED WITH YOUR NAUSEA MEDICATION *UNUSUAL SHORTNESS OF BREATH *UNUSUAL BRUISING OR BLEEDING *URINARY PROBLEMS (pain or burning when urinating, or frequent urination) *BOWEL PROBLEMS (unusual diarrhea, constipation, pain near the anus) TENDERNESS IN MOUTH AND THROAT WITH OR WITHOUT PRESENCE OF ULCERS (sore throat, sores in mouth, or a toothache) UNUSUAL RASH, SWELLING OR PAIN  UNUSUAL VAGINAL DISCHARGE OR ITCHING   Items with * indicate a potential emergency and should be followed up as soon as possible or go to the Emergency Department if any problems should occur.  Please show the CHEMOTHERAPY ALERT  CARD or IMMUNOTHERAPY ALERT CARD at check-in to the Emergency Department and triage nurse.  Should you have questions after your visit or need to cancel or reschedule your appointment, please contact [facility]  Dept: [phone]  and follow the prompts.  Office hours are 8:00 a.m. to 4:30 p.m. Monday - Friday. Please note that voicemails left after 4:00 p.m. may not be returned until the following business day.  We are closed weekends and major holidays. You have access to a nurse at all times for urgent questions. Please call the main number to the clinic Dept: [phone] and follow the prompts.   For any non-urgent questions, you may also contact your provider using MyChart. We now offer e-Visits for anyone 77 and older to request care online for non-urgent symptoms. For details visit [email or web address].   Also download the MyChart app! Go to the app store, search "MyChart", open the app, select [facility], and log in with your MyChart username and password.  Due to Covid, a mask is required upon entering the hospital/clinic. If you do not have a mask, one will be given to you upon arrival. For doctor visits, patients may have 1 support person aged 82 or older with them. For treatment visits, patients cannot have anyone with them due to current Covid guidelines and our immunocompromised population.   Nanoparticle Albumin-Bound Paclitaxel injection What is this medication? NANOPARTICLE ALBUMIN-BOUND PACLITAXEL (Na no PAHR ti kuhl al BYOO muhn-bound PAK li TAX el) is a chemotherapy drug. It targets fast dividing cells, like cancer cells, and causes these cells to die. This medicine  is used to treat advanced breast cancer, lung cancer, and pancreatic cancer. This medicine may be used for other purposes; ask your health care provider or pharmacist if you have questions. COMMON BRAND NAME(S): Abraxane What should I tell my care team before I take this  medication? They need to know if you have any of these conditions: kidney disease liver disease low blood counts, like low white cell, platelet, or red cell counts lung or breathing disease, like asthma tingling of the fingers or toes, or other nerve disorder an unusual or allergic reaction to paclitaxel, albumin, other chemotherapy, other medicines, foods, dyes, or preservatives pregnant or trying to get pregnant breast-feeding How should I use this medication? This drug is given as an infusion into a vein. It is administered in a hospital or clinic by a specially trained health care professional. Talk to your pediatrician regarding the use of this medicine in children. Special care may be needed. Overdosage: If you think you have taken too much of this medicine contact a poison control center or emergency room at once. NOTE: This medicine is only for you. Do not share this medicine with others. What if I miss a dose? It is important not to miss your dose. Call your doctor or health care professional if you are unable to keep an appointment. What may interact with this medication? This medicine may interact with the following medications: antiviral medicines for hepatitis, HIV or AIDS certain antibiotics like erythromycin and clarithromycin certain medicines for fungal infections like ketoconazole and itraconazole certain medicines for seizures like carbamazepine, phenobarbital, phenytoin gemfibrozil nefazodone rifampin St. John's wort This list may not describe all possible interactions. Give your health care provider a list of all the medicines, herbs, non-prescription drugs, or dietary supplements you use. Also tell them if you smoke, drink alcohol, or use illegal drugs. Some items may interact with your medicine. What should I watch for while using this medication? Your condition will be monitored carefully while you are receiving this medicine. You will need important blood work  done while you are taking this medicine. This medicine can cause serious allergic reactions. If you experience allergic reactions like skin rash, itching or hives, swelling of the face, lips, or tongue, tell your doctor or health care professional right away. In some cases, you may be given additional medicines to help with side effects. Follow all directions for their use. This drug may make you feel generally unwell. This is not uncommon, as chemotherapy can affect healthy cells as well as cancer cells. Report any side effects. Continue your course of treatment even though you feel ill unless your doctor tells you to stop. Call your doctor or health care professional for advice if you get a fever, chills or sore throat, or other symptoms of a cold or flu. Do not treat yourself. This drug decreases your body's ability to fight infections. Try to avoid being around people who are sick. This medicine may increase your risk to bruise or bleed. Call your doctor or health care professional if you notice any unusual bleeding. Be careful brushing and flossing your teeth or using a toothpick because you may get an infection or bleed more easily. If you have any dental work done, tell your dentist you are receiving this medicine. Avoid taking products that contain aspirin, acetaminophen, ibuprofen, naproxen, or ketoprofen unless instructed by your doctor. These medicines may hide a fever. Do not become pregnant while taking this medicine or for 6 months after stopping it.  Women should inform their doctor if they wish to become pregnant or think they might be pregnant. Men should not father a child while taking this medicine or for 3 months after stopping it. There is a potential for serious side effects to an unborn child. Talk to your health care professional or pharmacist for more information. Do not breast-feed an infant while taking this medicine or for 2 weeks after stopping it. This medicine may interfere  with the ability to get pregnant or to father a child. You should talk to your doctor or health care professional if you are concerned about your fertility. What side effects may I notice from receiving this medication? Side effects that you should report to your doctor or health care professional as soon as possible: allergic reactions like skin rash, itching or hives, swelling of the face, lips, or tongue breathing problems changes in vision fast, irregular heartbeat low blood pressure mouth sores pain, tingling, numbness in the hands or feet signs of decreased platelets or bleeding - bruising, pinpoint red spots on the skin, black, tarry stools, blood in the urine signs of decreased red blood cells - unusually weak or tired, feeling faint or lightheaded, falls signs of infection - fever or chills, cough, sore throat, pain or difficulty passing urine signs and symptoms of liver injury like dark yellow or brown urine; general ill feeling or flu-like symptoms; light-colored stools; loss of appetite; nausea; right upper belly pain; unusually weak or tired; yellowing of the eyes or skin swelling of the ankles, feet, hands unusually slow heartbeat Side effects that usually do not require medical attention (report to your doctor or health care professional if they continue or are bothersome): diarrhea hair loss loss of appetite nausea, vomiting tiredness This list may not describe all possible side effects. Call your doctor for medical advice about side effects. You may report side effects to FDA at 1-800-FDA-1088. Where should I keep my medication? This drug is given in a hospital or clinic and will not be stored at home. NOTE: This sheet is a summary. It may not cover all possible information. If you have questions about this medicine, talk to your doctor, pharmacist, or health care provider.  2022 Elsevier/Gold Standard (2017-07-05 13:03:45)  Gemcitabine injection What is this  medication? GEMCITABINE (jem SYE ta been) is a chemotherapy drug. This medicine is used to treat many types of cancer like breast cancer, lung cancer, pancreatic cancer, and ovarian cancer. This medicine may be used for other purposes; ask your health care provider or pharmacist if you have questions. COMMON BRAND NAME(S): Gemzar, Infugem What should I tell my care team before I take this medication? They need to know if you have any of these conditions: blood disorders infection kidney disease liver disease lung or breathing disease, like asthma recent or ongoing radiation therapy an unusual or allergic reaction to gemcitabine, other chemotherapy, other medicines, foods, dyes, or preservatives pregnant or trying to get pregnant breast-feeding How should I use this medication? This drug is given as an infusion into a vein. It is administered in a hospital or clinic by a specially trained health care professional. Talk to your pediatrician regarding the use of this medicine in children. Special care may be needed. Overdosage: If you think you have taken too much of this medicine contact a poison control center or emergency room at once. NOTE: This medicine is only for you. Do not share this medicine with others. What if I miss a dose? It is  important not to miss your dose. Call your doctor or health care professional if you are unable to keep an appointment. What may interact with this medication? medicines to increase blood counts like filgrastim, pegfilgrastim, sargramostim some other chemotherapy drugs like cisplatin vaccines Talk to your doctor or health care professional before taking any of these medicines: acetaminophen aspirin ibuprofen ketoprofen naproxen This list may not describe all possible interactions. Give your health care provider a list of all the medicines, herbs, non-prescription drugs, or dietary supplements you use. Also tell them if you smoke, drink alcohol, or  use illegal drugs. Some items may interact with your medicine. What should I watch for while using this medication? Visit your doctor for checks on your progress. This drug may make you feel generally unwell. This is not uncommon, as chemotherapy can affect healthy cells as well as cancer cells. Report any side effects. Continue your course of treatment even though you feel ill unless your doctor tells you to stop. In some cases, you may be given additional medicines to help with side effects. Follow all directions for their use. Call your doctor or health care professional for advice if you get a fever, chills or sore throat, or other symptoms of a cold or flu. Do not treat yourself. This drug decreases your body's ability to fight infections. Try to avoid being around people who are sick. This medicine may increase your risk to bruise or bleed. Call your doctor or health care professional if you notice any unusual bleeding. Be careful brushing and flossing your teeth or using a toothpick because you may get an infection or bleed more easily. If you have any dental work done, tell your dentist you are receiving this medicine. Avoid taking products that contain aspirin, acetaminophen, ibuprofen, naproxen, or ketoprofen unless instructed by your doctor. These medicines may hide a fever. Do not become pregnant while taking this medicine or for 6 months after stopping it. Women should inform their doctor if they wish to become pregnant or think they might be pregnant. Men should not father a child while taking this medicine and for 3 months after stopping it. There is a potential for serious side effects to an unborn child. Talk to your health care professional or pharmacist for more information. Do not breast-feed an infant while taking this medicine or for at least 1 week after stopping it. Men should inform their doctors if they wish to father a child. This medicine may lower sperm counts. Talk with your  doctor or health care professional if you are concerned about your fertility. What side effects may I notice from receiving this medication? Side effects that you should report to your doctor or health care professional as soon as possible: allergic reactions like skin rash, itching or hives, swelling of the face, lips, or tongue breathing problems pain, redness, or irritation at site where injected signs and symptoms of a dangerous change in heartbeat or heart rhythm like chest pain; dizziness; fast or irregular heartbeat; palpitations; feeling faint or lightheaded, falls; breathing problems signs of decreased platelets or bleeding - bruising, pinpoint red spots on the skin, black, tarry stools, blood in the urine signs of decreased red blood cells - unusually weak or tired, feeling faint or lightheaded, falls signs of infection - fever or chills, cough, sore throat, pain or difficulty passing urine signs and symptoms of kidney injury like trouble passing urine or change in the amount of urine signs and symptoms of liver injury like dark  yellow or brown urine; general ill feeling or flu-like symptoms; light-colored stools; loss of appetite; nausea; right upper belly pain; unusually weak or tired; yellowing of the eyes or skin swelling of ankles, feet, hands Side effects that usually do not require medical attention (report to your doctor or health care professional if they continue or are bothersome): constipation diarrhea hair loss loss of appetite nausea rash vomiting This list may not describe all possible side effects. Call your doctor for medical advice about side effects. You may report side effects to FDA at 1-800-FDA-1088. Where should I keep my medication? This drug is given in a hospital or clinic and will not be stored at home. NOTE: This sheet is a summary. It may not cover all possible information. If you have questions about this medicine, talk to your doctor, pharmacist, or  health care provider.  2022 Elsevier/Gold Standard (2018-01-25 18:06:11)  Romiplostim injection What is this medication? ROMIPLOSTIM (roe mi PLOE stim) helps your body make more platelets. This medicine is used to treat low platelets caused by chronic idiopathic thrombocytopenic purpura (ITP) or a bone marrow syndrome caused by radiation sickness. This medicine may be used for other purposes; ask your health care provider or pharmacist if you have questions. COMMON BRAND NAME(S): Nplate What should I tell my care team before I take this medication? They need to know if you have any of these conditions: blood clots myelodysplastic syndrome an unusual or allergic reaction to romiplostim, mannitol, other medicines, foods, dyes, or preservatives pregnant or trying to get pregnant breast-feeding How should I use this medication? This medicine is injected under the skin. It is given by a health care provider in a hospital or clinic setting. A special MedGuide will be given to you before each treatment. Be sure to read this information carefully each time. Talk to your health care provider about the use of this medicine in children. While it may be prescribed for children as young as newborns for selected conditions, precautions do apply. Overdosage: If you think you have taken too much of this medicine contact a poison control center or emergency room at once. NOTE: This medicine is only for you. Do not share this medicine with others. What if I miss a dose? Keep appointments for follow-up doses. It is important not to miss your dose. Call your health care provider if you are unable to keep an appointment. What may interact with this medication? Interactions are not expected. This list may not describe all possible interactions. Give your health care provider a list of all the medicines, herbs, non-prescription drugs, or dietary supplements you use. Also tell them if you smoke, drink alcohol, or  use illegal drugs. Some items may interact with your medicine. What should I watch for while using this medication? Visit your health care provider for regular checks on your progress. You may need blood work done while you are taking this medicine. Your condition will be monitored carefully while you are receiving this medicine. It is important not to miss any appointments. What side effects may I notice from receiving this medication? Side effects that you should report to your doctor or health care professional as soon as possible: allergic reactions (skin rash, itching or hives; swelling of the face, lips, or tongue) bleeding (bloody or black, tarry stools; red or dark brown urine; spitting up blood or brown material that looks like coffee grounds; red spots on the skin; unusual bruising or bleeding from the eyes, gums, or nose) blood  clot (chest pain; shortness of breath; pain, swelling, or warmth in the leg) stroke (changes in vision; confusion; trouble speaking or understanding; severe headaches; sudden numbness or weakness of the face, arm or leg; trouble walking; dizziness; loss of balance or coordination) Side effects that usually do not require medical attention (report to your doctor or health care professional if they continue or are bothersome): diarrhea dizziness headache joint pain muscle pain stomach pain trouble sleeping This list may not describe all possible side effects. Call your doctor for medical advice about side effects. You may report side effects to FDA at 1-800-FDA-1088. Where should I keep my medication? This medicine is given in a hospital or clinic. It will not be stored at home. NOTE: This sheet is a summary. It may not cover all possible information. If you have questions about this medicine, talk to your doctor, pharmacist, or health care provider.  2022 Elsevier/Gold Standard (2019-12-17 10:28:13)

## 2021-08-06 NOTE — Progress Notes (Signed)
Patient presents for treatment. RN assessment completed along with the following:  Labs/vitals reviewed - Yes, and Platelet count 79. Okay to proceed per Lattie Haw, NP.    Weight within 10% of previous measurement - Yes Oncology Treatment Attestation completed for current therapy- Yes, on date 02/23/2021 Informed consent completed and reflects current therapy/intent - Yes, on date 03/12/2021             Provider progress note reviewed - Yes, today's provider note was reviewed. Treatment/Antibody/Supportive plan reviewed - Yes, and there are no adjustments needed for today's treatment. S&H and other orders reviewed - Yes, and there are no additional orders identified. Previous treatment date reviewed - Yes, and the appropriate amount of time has elapsed between treatments. Clinic Hand Off Received from - No  Patient to proceed with treatment.

## 2021-08-06 NOTE — Progress Notes (Signed)
[facility] OFFICE PROGRESS NOTE   Diagnosis: Pancreas cancer  INTERVAL HISTORY:   Aaron Johnston returns as scheduled.  He completed another cycle of gemcitabine/Abraxane 07/23/2021.  He continues weekly Nplate.  He denies nausea/vomiting.  He has a single healing mouth sore which he thinks is related to eating something hot.  He continues to have intermittent loose stools.  No numbness or tingling in the hands or feet.  Pain is "okay".  He denies shortness of breath.  No bleeding.  He reports a good appetite.  He has gained weight since his last visit.  Objective:  Vital signs in last 24 hours:  Blood pressure 118/60, pulse 74, temperature 98.1 F (36.7 C), temperature source Oral, resp. rate 18, height '5\' 9"'  (1.753 m), weight 135 lb 3.2 oz (61.3 kg), SpO2 100 %.    HEENT: Healing blister type lesion right buccal mucosa.  No thrush. Resp: Lungs clear bilaterally. Cardio: Regular rate and rhythm. GI: Abdomen soft and nontender.  No hepatomegaly. Vascular: Trace pitting edema lower leg bilaterally left greater than right. Neuro: Alert and oriented. Skin: No rash. Port-A-Cath without erythema.   Lab Results:  Lab Results  Component Value Date   WBC 22.4 (H) 07/31/2021   HGB 7.8 (L) 07/31/2021   HCT 24.6 (L) 07/31/2021   MCV 97.2 07/31/2021   PLT 133 (L) 07/31/2021   NEUTROABS 19.2 (H) 07/31/2021    Imaging:  No results found.  Medications: I have reviewed the patient's current medications.  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, mild biliary ductal dilatation, diffuse hypodense/hypoenhancing  areas within the liver (edema versus infiltrating neoplasm), 2 x 1.7 cm area of focal prominence in the pancreas EUS 07/10/2020-no pancreas mass identified, 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, 1.5 cm aortocaval node biopsy-scant lymphoid material, no malignancy Diagnostic laparoscopy 07/17/2020-segment 3 and 4 liver nodules, adenocarcinoma, positive for pankeratin, CK7 and CK20, partially positive for TTF-1, negative for CDX2 Foundation 1-microsatellite stable, tumor mutation burden 4, K-ras G12D, subclonal RB1 alteration Elevated CA 19-9 Cycle 1 FOLFOX 07/28/2020 Cycle 2 FOLFOX 08/18/2020, oxaliplatin dose reduced due to thrombocytopenia Cycle 3 FOLFOX 09/08/2020 Cycle 4 FOLFOX 09/29/2020  Cycle 5 FOLFOX 10/20/2020 CTs 11/06/2020-grossly stable infiltrative mass within the pancreatic head and porta hepatis.  New small low-density liver lesions.  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 Cycle 6 FOLFOX 11/10/2021  Cycle 7 FOLFOX 12/01/2020 MRI liver 12/04/2020-no significant change in posttreatment appearance of the pancreatic head.  Numerous intrinsically low signal hypoenhancing lesions of the liver parenchyma predominantly observed in the right lobe of the liver and liver dome, measuring no greater than 8 mm and as seen on recent prior CT of the abdomen/pelvis. Cycle 8 FOLFOX 12/22/2020 Cycle 9 FOLFOX 01/12/2021 Cycle 10 FOLFOX 02/02/2021 MRI liver 02/19/2021-no change in pancreas head mass, multiple hypoenhancing liver lesions are new and increased in size, effacement of portal vein by pancreas head mass with cavernous transformation, no change in mild splenomegaly Cycle 1 gemcitabine/Abraxane 03/12/2021 Cycle 2 gemcitabine/Abraxane 03/27/2021- dose reductions secondary to neutropenia Cycle 3 gemcitabine/Abraxane 04/17/2021-dose reductions due to thrombocytopenia, white  cell growth factor support added Cycle 4 gemcitabine/Abraxane 05/01/2021, Ziextenzo Cycle 5 gemcitabine/Abraxane 05/15/2021, Ziextenzo MRI abdomen 05/26/2021-mild enlargement of dominant hepatic metastases, appearance of infiltrative pancreas mass extending to the celiac trunk and SMA, splenic vein occlusion, cavernous transformation of the portal vein with upper abdomen collaterals, splenomegaly, mild upper abdominal ascites and mesenteric edema, minimal subpleural nodularity left lower lobe Cycle 6 gemcitabine/Abraxane 05/28/2021 Cycle 7 gemcitabine/Abraxane 06/12/2021 Cycle 8 gemcitabine/Abraxane 06/26/2021 Cycle 9 gemcitabine/Abraxane 07/10/2021 Cycle 10 gemcitabine/Abraxane 07/23/2021 Cycle 11 gemcitabine/Abraxane 08/06/2021   Chronic thrombocytopenia- weekly Nplate beginning 5/40/0867 Diabetes Hypertension 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  He has completed 10 cycles of gemcitabine/Abraxane.  He continues to tolerate chemotherapy well.  Plan to proceed with cycle 11 today as scheduled.  Restaging MRI liver prior to next office visit.  CBC from today reviewed.  Counts adequate for treatment.  He has moderate thrombocytopenia.  Plan to continue weekly Nplate.  He will contact the office with bleeding.  He will return for lab, follow-up, Gemcitabine/Abraxane in 2 weeks with restaging MRI of the liver a few days prior.  He will contact the office in the interim as outlined above or with any other problems.    [name] ANP/GNP-BC   08/06/2021  8:46 AM

## 2021-08-07 ENCOUNTER — Other Ambulatory Visit: Payer: Self-pay | Admitting: Oncology

## 2021-08-07 ENCOUNTER — Other Ambulatory Visit: Payer: Self-pay

## 2021-08-07 ENCOUNTER — Inpatient Hospital Stay: Payer: Medicare Other

## 2021-08-07 VITALS — BP 117/55 | HR 71 | Temp 98.1°F | Resp 18

## 2021-08-07 DIAGNOSIS — C250 Malignant neoplasm of head of pancreas: Secondary | ICD-10-CM

## 2021-08-07 MED ORDER — POTASSIUM CHLORIDE CRYS ER 20 MEQ PO TBCR: 20.0000 meq | EXTENDED_RELEASE_TABLET | Freq: Every day | ORAL | 1 refills | Status: DC

## 2021-08-07 MED ORDER — PEGFILGRASTIM-BMEZ 6 MG/0.6ML ~~LOC~~ SOSY
6.0000 mg | PREFILLED_SYRINGE | Freq: Once | SUBCUTANEOUS | Status: AC
  Administered 2021-08-07: 6 mg via SUBCUTANEOUS

## 2021-08-07 NOTE — Patient Instructions (Signed)
[facility]  Discharge Instructions: Thank you for choosing [facility] to provide your oncology and hematology care.   If you have a lab appointment with the [facility], please go directly to the [facility] and check in at the registration area.   Wear comfortable clothing and clothing appropriate for easy access to any Portacath or PICC line.   We strive to give you quality time with your provider. You may need to reschedule your appointment if you arrive late (15 or more minutes).  Arriving late affects you and other patients whose appointments are after yours.  Also, if you miss three or more appointments without notifying the office, you may be dismissed from the clinic at the provider's discretion.      For prescription refill requests, have your pharmacy contact our office and allow 72 hours for refills to be completed.    Today you received the following chemotherapy and/or immunotherapy agents ZIEXTENZO      To help prevent nausea and vomiting after your treatment, we encourage you to take your nausea medication as directed.  BELOW ARE SYMPTOMS THAT SHOULD BE REPORTED IMMEDIATELY: *FEVER GREATER THAN 100.4 F (38 C) OR HIGHER *CHILLS OR SWEATING *NAUSEA AND VOMITING THAT IS NOT CONTROLLED WITH YOUR NAUSEA MEDICATION *UNUSUAL SHORTNESS OF BREATH *UNUSUAL BRUISING OR BLEEDING *URINARY PROBLEMS (pain or burning when urinating, or frequent urination) *BOWEL PROBLEMS (unusual diarrhea, constipation, pain near the anus) TENDERNESS IN MOUTH AND THROAT WITH OR WITHOUT PRESENCE OF ULCERS (sore throat, sores in mouth, or a toothache) UNUSUAL RASH, SWELLING OR PAIN  UNUSUAL VAGINAL DISCHARGE OR ITCHING   Items with * indicate a potential emergency and should be followed up as soon as possible or go to the Emergency Department if any problems should occur.  Please show the CHEMOTHERAPY ALERT CARD or IMMUNOTHERAPY ALERT CARD at check-in to the  Emergency Department and triage nurse.  Should you have questions after your visit or need to cancel or reschedule your appointment, please contact [facility]  Dept: [phone]  and follow the prompts.  Office hours are 8:00 a.m. to 4:30 p.m. Monday - Friday. Please note that voicemails left after 4:00 p.m. may not be returned until the following business day.  We are closed weekends and major holidays. You have access to a nurse at all times for urgent questions. Please call the main number to the clinic Dept: [phone] and follow the prompts.   For any non-urgent questions, you may also contact your provider using MyChart. We now offer e-Visits for anyone 65 and older to request care online for non-urgent symptoms. For details visit [email or web address].   Also download the MyChart app! Go to the app store, search "MyChart", open the app, select [facility], and log in with your MyChart username and password.  Due to Covid, a mask is required upon entering the hospital/clinic. If you do not have a mask, one will be given to you upon arrival. For doctor visits, patients may have 1 support person aged 63 or older with them. For treatment visits, patients cannot have anyone with them due to current Covid guidelines and our immunocompromised population.   Pegfilgrastim injection What is this medication? PEGFILGRASTIM (PEG fil gra stim) is a long-acting granulocyte colony-stimulating factor that stimulates the growth of neutrophils, a type of white blood cell important in the body's fight against infection. It is used to reduce the incidence of fever and infection in patients  with certain types of cancer who are receiving chemotherapy that affects the bone marrow, and to increase survival after being exposed to high doses of radiation. This medicine may be used for other purposes; ask your health care provider or pharmacist if you have questions. COMMON BRAND NAME(S):  Fulphila, Neulasta, Nyvepria, UDENYCA, Ziextenzo What should I tell my care team before I take this medication? They need to know if you have any of these conditions: kidney disease latex allergy ongoing radiation therapy sickle cell disease skin reactions to acrylic adhesives (On-Body Injector only) an unusual or allergic reaction to pegfilgrastim, filgrastim, other medicines, foods, dyes, or preservatives pregnant or trying to get pregnant breast-feeding How should I use this medication? This medicine is for injection under the skin. If you get this medicine at home, you will be taught how to prepare and give the pre-filled syringe or how to use the On-body Injector. Refer to the patient Instructions for Use for detailed instructions. Use exactly as directed. Tell your healthcare provider immediately if you suspect that the On-body Injector may not have performed as intended or if you suspect the use of the On-body Injector resulted in a missed or partial dose. It is important that you put your used needles and syringes in a special sharps container. Do not put them in a trash can. If you do not have a sharps container, call your pharmacist or healthcare provider to get one. Talk to your pediatrician regarding the use of this medicine in children. While this drug may be prescribed for selected conditions, precautions do apply. Overdosage: If you think you have taken too much of this medicine contact a poison control center or emergency room at once. NOTE: This medicine is only for you. Do not share this medicine with others. What if I miss a dose? It is important not to miss your dose. Call your doctor or health care professional if you miss your dose. If you miss a dose due to an On-body Injector failure or leakage, a new dose should be administered as soon as possible using a single prefilled syringe for manual use. What may interact with this medication? Interactions have not been  studied. This list may not describe all possible interactions. Give your health care provider a list of all the medicines, herbs, non-prescription drugs, or dietary supplements you use. Also tell them if you smoke, drink alcohol, or use illegal drugs. Some items may interact with your medicine. What should I watch for while using this medication? Your condition will be monitored carefully while you are receiving this medicine. You may need blood work done while you are taking this medicine. Talk to your health care provider about your risk of cancer. You may be more at risk for certain types of cancer if you take this medicine. If you are going to need a MRI, CT scan, or other procedure, tell your doctor that you are using this medicine (On-Body Injector only). What side effects may I notice from receiving this medication? Side effects that you should report to your doctor or health care professional as soon as possible: allergic reactions (skin rash, itching or hives, swelling of the face, lips, or tongue) back pain dizziness fever pain, redness, or irritation at site where injected pinpoint red spots on the skin red or dark-brown urine shortness of breath or breathing problems stomach or side pain, or pain at the shoulder swelling tiredness trouble passing urine or change in the amount of urine unusual bruising or bleeding   Side effects that usually do not require medical attention (report to your doctor or health care professional if they continue or are bothersome): bone pain muscle pain This list may not describe all possible side effects. Call your doctor for medical advice about side effects. You may report side effects to FDA at 1-800-FDA-1088. Where should I keep my medication? Keep out of the reach of children. If you are using this medicine at home, you will be instructed on how to store it. Throw away any unused medicine after the expiration date on the label. NOTE: This sheet  is a summary. It may not cover all possible information. If you have questions about this medicine, talk to your doctor, pharmacist, or health care provider.  2022 Elsevier/Gold Standard (2020-11-28 11:54:14)   

## 2021-08-07 NOTE — Progress Notes (Signed)
Potassium refilled

## 2021-08-13 ENCOUNTER — Inpatient Hospital Stay: Payer: Medicare Other

## 2021-08-13 ENCOUNTER — Other Ambulatory Visit: Payer: Self-pay

## 2021-08-13 VITALS — BP 104/55 | HR 70 | Temp 97.7°F | Resp 18

## 2021-08-13 DIAGNOSIS — C250 Malignant neoplasm of head of pancreas: Secondary | ICD-10-CM

## 2021-08-13 DIAGNOSIS — Z95828 Presence of other vascular implants and grafts: Secondary | ICD-10-CM

## 2021-08-13 LAB — CBC WITH DIFFERENTIAL (CANCER CENTER ONLY)
Abs Immature Granulocytes: 0.97 10*3/uL — ABNORMAL HIGH (ref 0.00–0.07)
Basophils Absolute: 0.1 10*3/uL (ref 0.0–0.1)
Basophils Relative: 1 %
Eosinophils Absolute: 0.3 10*3/uL (ref 0.0–0.5)
Eosinophils Relative: 1 %
HCT: 24.9 % — ABNORMAL LOW (ref 39.0–52.0)
Hemoglobin: 7.9 g/dL — ABNORMAL LOW (ref 13.0–17.0)
Immature Granulocytes: 4 %
Lymphocytes Relative: 4 %
Lymphs Abs: 1 10*3/uL (ref 0.7–4.0)
MCH: 32 pg (ref 26.0–34.0)
MCHC: 31.7 g/dL (ref 30.0–36.0)
MCV: 100.8 fL — ABNORMAL HIGH (ref 80.0–100.0)
Monocytes Absolute: 1 10*3/uL (ref 0.1–1.0)
Monocytes Relative: 4 %
Neutro Abs: 20.6 10*3/uL — ABNORMAL HIGH (ref 1.7–7.7)
Neutrophils Relative %: 86 %
Platelet Count: 116 10*3/uL — ABNORMAL LOW (ref 150–400)
RBC: 2.47 MIL/uL — ABNORMAL LOW (ref 4.22–5.81)
RDW: 16.6 % — ABNORMAL HIGH (ref 11.5–15.5)
WBC Count: 24 10*3/uL — ABNORMAL HIGH (ref 4.0–10.5)
nRBC: 0 % (ref 0.0–0.2)

## 2021-08-13 MED ORDER — ROMIPLOSTIM 125 MCG ~~LOC~~ SOLR
2.0000 ug/kg | Freq: Once | SUBCUTANEOUS | Status: AC
  Administered 2021-08-13: 125 ug via SUBCUTANEOUS
  Filled 2021-08-13: qty 0.25

## 2021-08-13 MED ORDER — SODIUM CHLORIDE 0.9% FLUSH
10.0000 mL | INTRAVENOUS | Status: DC | PRN
  Administered 2021-08-13: 10 mL

## 2021-08-13 MED ORDER — HEPARIN SOD (PORK) LOCK FLUSH 100 UNIT/ML IV SOLN
500.0000 [IU] | Freq: Once | INTRAVENOUS | Status: AC | PRN
  Administered 2021-08-13: 500 [IU]

## 2021-08-13 NOTE — Patient Instructions (Signed)
Romiplostim injection What is this medication? ROMIPLOSTIM (roe mi PLOE stim) helps your body make more platelets. This medicine is used to treat low platelets caused by chronic idiopathic thrombocytopenic purpura (ITP) or a bone marrow syndrome caused by radiation sickness. This medicine may be used for other purposes; ask your health care provider or pharmacist if you have questions. COMMON BRAND NAME(S): Nplate What should I tell my care team before I take this medication? They need to know if you have any of these conditions: blood clots myelodysplastic syndrome an unusual or allergic reaction to romiplostim, mannitol, other medicines, foods, dyes, or preservatives pregnant or trying to get pregnant breast-feeding How should I use this medication? This medicine is injected under the skin. It is given by a health care provider in a hospital or clinic setting. A special MedGuide will be given to you before each treatment. Be sure to read this information carefully each time. Talk to your health care provider about the use of this medicine in children. While it may be prescribed for children as young as newborns for selected conditions, precautions do apply. Overdosage: If you think you have taken too much of this medicine contact a poison control center or emergency room at once. NOTE: This medicine is only for you. Do not share this medicine with others. What if I miss a dose? Keep appointments for follow-up doses. It is important not to miss your dose. Call your health care provider if you are unable to keep an appointment. What may interact with this medication? Interactions are not expected. This list may not describe all possible interactions. Give your health care provider a list of all the medicines, herbs, non-prescription drugs, or dietary supplements you use. Also tell them if you smoke, drink alcohol, or use illegal drugs. Some items may interact with your medicine. What should I  watch for while using this medication? Visit your health care provider for regular checks on your progress. You may need blood work done while you are taking this medicine. Your condition will be monitored carefully while you are receiving this medicine. It is important not to miss any appointments. What side effects may I notice from receiving this medication? Side effects that you should report to your doctor or health care professional as soon as possible: allergic reactions (skin rash, itching or hives; swelling of the face, lips, or tongue) bleeding (bloody or black, tarry stools; red or dark brown urine; spitting up blood or brown material that looks like coffee grounds; red spots on the skin; unusual bruising or bleeding from the eyes, gums, or nose) blood clot (chest pain; shortness of breath; pain, swelling, or warmth in the leg) stroke (changes in vision; confusion; trouble speaking or understanding; severe headaches; sudden numbness or weakness of the face, arm or leg; trouble walking; dizziness; loss of balance or coordination) Side effects that usually do not require medical attention (report to your doctor or health care professional if they continue or are bothersome): diarrhea dizziness headache joint pain muscle pain stomach pain trouble sleeping This list may not describe all possible side effects. Call your doctor for medical advice about side effects. You may report side effects to FDA at 1-800-FDA-1088. Where should I keep my medication? This medicine is given in a hospital or clinic. It will not be stored at home. NOTE: This sheet is a summary. It may not cover all possible information. If you have questions about this medicine, talk to your doctor, pharmacist, or health care provider.    2022 Elsevier/Gold Standard (2019-12-17 10:28:13)  

## 2021-08-16 ENCOUNTER — Other Ambulatory Visit: Payer: Self-pay | Admitting: Oncology

## 2021-08-19 ENCOUNTER — Other Ambulatory Visit: Payer: Self-pay | Admitting: Nurse Practitioner

## 2021-08-19 ENCOUNTER — Ambulatory Visit (HOSPITAL_COMMUNITY)
Admission: RE | Admit: 2021-08-19 | Discharge: 2021-08-19 | Disposition: A | Discharge status: Home / Self Care | Payer: Medicare Other | Source: Ambulatory Visit | Attending: Nurse Practitioner | Admitting: Nurse Practitioner

## 2021-08-19 ENCOUNTER — Other Ambulatory Visit: Payer: Self-pay

## 2021-08-19 DIAGNOSIS — C250 Malignant neoplasm of head of pancreas: Secondary | ICD-10-CM

## 2021-08-19 MED ORDER — GADOBUTROL 1 MMOL/ML IV SOLN
6.0000 mL | Freq: Once | INTRAVENOUS | Status: AC | PRN
  Administered 2021-08-19: 6 mL via INTRAVENOUS

## 2021-08-20 ENCOUNTER — Inpatient Hospital Stay: Payer: Medicare Other

## 2021-08-20 ENCOUNTER — Inpatient Hospital Stay (HOSPITAL_BASED_OUTPATIENT_CLINIC_OR_DEPARTMENT_OTHER): Payer: Medicare Other | Admitting: Oncology

## 2021-08-20 ENCOUNTER — Inpatient Hospital Stay: Payer: Medicare Other | Attending: Genetic Counselor

## 2021-08-20 ENCOUNTER — Ambulatory Visit: Payer: Self-pay

## 2021-08-20 VITALS — BP 110/65 | HR 74 | Temp 98.1°F | Resp 20 | Ht 69.0 in | Wt 136.8 lb

## 2021-08-20 DIAGNOSIS — D701 Agranulocytosis secondary to cancer chemotherapy: Secondary | ICD-10-CM | POA: Insufficient documentation

## 2021-08-20 DIAGNOSIS — D696 Thrombocytopenia, unspecified: Secondary | ICD-10-CM | POA: Insufficient documentation

## 2021-08-20 DIAGNOSIS — Z5111 Encounter for antineoplastic chemotherapy: Secondary | ICD-10-CM | POA: Insufficient documentation

## 2021-08-20 DIAGNOSIS — E119 Type 2 diabetes mellitus without complications: Secondary | ICD-10-CM | POA: Insufficient documentation

## 2021-08-20 DIAGNOSIS — C250 Malignant neoplasm of head of pancreas: Secondary | ICD-10-CM | POA: Diagnosis not present

## 2021-08-20 DIAGNOSIS — C787 Secondary malignant neoplasm of liver and intrahepatic bile duct: Secondary | ICD-10-CM | POA: Diagnosis present

## 2021-08-20 DIAGNOSIS — Z79899 Other long term (current) drug therapy: Secondary | ICD-10-CM | POA: Insufficient documentation

## 2021-08-20 DIAGNOSIS — Z95828 Presence of other vascular implants and grafts: Secondary | ICD-10-CM

## 2021-08-20 DIAGNOSIS — Z5189 Encounter for other specified aftercare: Secondary | ICD-10-CM | POA: Diagnosis not present

## 2021-08-20 DIAGNOSIS — T451X5D Adverse effect of antineoplastic and immunosuppressive drugs, subsequent encounter: Secondary | ICD-10-CM | POA: Diagnosis not present

## 2021-08-20 LAB — CMP (CANCER CENTER ONLY)
ALT: 48 U/L — ABNORMAL HIGH (ref 0–44)
AST: 33 U/L (ref 15–41)
Albumin: 3.9 g/dL (ref 3.5–5.0)
Alkaline Phosphatase: 282 U/L — ABNORMAL HIGH (ref 38–126)
Anion gap: 8 (ref 5–15)
BUN: 17 mg/dL (ref 8–23)
CO2: 20 mmol/L — ABNORMAL LOW (ref 22–32)
Calcium: 8.7 mg/dL — ABNORMAL LOW (ref 8.9–10.3)
Chloride: 110 mmol/L (ref 98–111)
Creatinine: 1.28 mg/dL — ABNORMAL HIGH (ref 0.61–1.24)
GFR, Estimated: 59 mL/min — ABNORMAL LOW (ref >60)
Glucose, Bld: 205 mg/dL — ABNORMAL HIGH (ref 70–99)
Potassium: 4.4 mmol/L (ref 3.5–5.1)
Sodium: 138 mmol/L (ref 135–145)
Total Bilirubin: 0.6 mg/dL (ref 0.3–1.2)
Total Protein: 6.5 g/dL (ref 6.5–8.1)

## 2021-08-20 LAB — CBC WITH DIFFERENTIAL (CANCER CENTER ONLY)
Abs Immature Granulocytes: 0.51 10*3/uL — ABNORMAL HIGH (ref 0.00–0.07)
Basophils Absolute: 0.1 10*3/uL (ref 0.0–0.1)
Basophils Relative: 0 %
Eosinophils Absolute: 0.4 10*3/uL (ref 0.0–0.5)
Eosinophils Relative: 2 %
HCT: 24.2 % — ABNORMAL LOW (ref 39.0–52.0)
Hemoglobin: 7.7 g/dL — ABNORMAL LOW (ref 13.0–17.0)
Immature Granulocytes: 3 %
Lymphocytes Relative: 5 %
Lymphs Abs: 0.8 10*3/uL (ref 0.7–4.0)
MCH: 32.5 pg (ref 26.0–34.0)
MCHC: 31.8 g/dL (ref 30.0–36.0)
MCV: 102.1 fL — ABNORMAL HIGH (ref 80.0–100.0)
Monocytes Absolute: 0.6 10*3/uL (ref 0.1–1.0)
Monocytes Relative: 4 %
Neutro Abs: 13.8 10*3/uL — ABNORMAL HIGH (ref 1.7–7.7)
Neutrophils Relative %: 86 %
Platelet Count: 139 10*3/uL — ABNORMAL LOW (ref 150–400)
RBC: 2.37 MIL/uL — ABNORMAL LOW (ref 4.22–5.81)
RDW: 16.9 % — ABNORMAL HIGH (ref 11.5–15.5)
WBC Count: 16.2 10*3/uL — ABNORMAL HIGH (ref 4.0–10.5)
nRBC: 0 % (ref 0.0–0.2)

## 2021-08-20 LAB — MAGNESIUM: Magnesium: 1.8 mg/dL (ref 1.7–2.4)

## 2021-08-20 MED ORDER — SODIUM CHLORIDE 0.9% FLUSH
10.0000 mL | INTRAVENOUS | Status: AC | PRN
  Administered 2021-08-20: 10 mL

## 2021-08-20 MED ORDER — HEPARIN SOD (PORK) LOCK FLUSH 100 UNIT/ML IV SOLN
500.0000 [IU] | Freq: Once | INTRAVENOUS | Status: AC | PRN
  Administered 2021-08-20: 500 [IU]

## 2021-08-20 NOTE — Addendum Note (Signed)
Addended by: Lenox Ponds E on: 08/20/2021 10:51 AM   Modules accepted: Orders

## 2021-08-20 NOTE — Progress Notes (Addendum)
[facility] OFFICE PROGRESS NOTE   Diagnosis: Pancreas cancer  INTERVAL HISTORY:   Mr. Hannen completed another cycle of gemcitabine/Abraxane on 08/06/2021.  No fever or rash.  He continues to have diarrhea approximately 3 times per day.  Good appetite and energy level.  He continues G-CSF and Nplate support.  Objective:  Vital signs in last 24 hours:  Blood pressure 110/65, pulse 74, temperature 98.1 F (36.7 C), temperature source Oral, resp. rate 20, height '5\' 9"'  (1.753 m), weight 136 lb 12.8 oz (62.1 kg), SpO2 100 %.    HEENT: No thrush or ulcers Resp: Lungs clear bilaterally Cardio: Regular rate and rhythm GI: No mass, no hepatosplenomegaly, nontender Vascular: Trace lower leg edema bilaterally    Portacath/PICC-without erythema  Lab Results:  Lab Results  Component Value Date   WBC 16.2 (H) 08/20/2021   HGB 7.7 (L) 08/20/2021   HCT 24.2 (L) 08/20/2021   MCV 102.1 (H) 08/20/2021   PLT 139 (L) 08/20/2021   NEUTROABS 13.8 (H) 08/20/2021    CMP  Lab Results  Component Value Date   NA 141 08/06/2021   K 3.8 08/06/2021   CL 114 (H) 08/06/2021   CO2 18 (L) 08/06/2021   GLUCOSE 147 (H) 08/06/2021   BUN 16 08/06/2021   CREATININE 1.21 08/06/2021   CALCIUM 8.8 (L) 08/06/2021   PROT 6.5 08/06/2021   ALBUMIN 3.9 08/06/2021   AST 27 08/06/2021   ALT 41 08/06/2021   ALKPHOS 288 (H) 08/06/2021   BILITOT 0.6 08/06/2021   GFRNONAA >60 08/06/2021   GFRAA NOT CALCULATED 02/23/2021    Lab Results  Component Value Date   EVO350 2,441 (H) 07/23/2021    Medications: I have reviewed the patient's current medications.   Assessment/Plan: Pancreas cancer CT urology center 03/04/2020-haziness of the fat adjacent to the celiac axis and SMA with focal narrowing of the SMV, splenic vein, and splenoportal confluence CT 07/07/2020-infiltrative retroperitoneal mass with encasement of the celiac axis and SMA, high-grade narrowing of the proximal portal vein with  cavernous transformation, nonocclusive thrombus within the main portal vein, mild biliary ductal dilatation, diffuse hypodense/hypoenhancing areas within the liver (edema versus infiltrating neoplasm), 2 x 1.7 cm area of focal prominence in the pancreas EUS 07/10/2020-no pancreas mass identified, 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, 1.5 cm aortocaval node biopsy-scant lymphoid material, no malignancy Diagnostic laparoscopy 07/17/2020-segment 3 and 4 liver nodules, adenocarcinoma, positive for pankeratin, CK7 and CK20, partially positive for TTF-1, negative for CDX2 Foundation 1-microsatellite stable, tumor mutation burden 4, K-ras G12D, subclonal RB1 alteration Elevated CA 19-9 Cycle 1 FOLFOX 07/28/2020 Cycle 2 FOLFOX 08/18/2020, oxaliplatin dose reduced due to thrombocytopenia Cycle 3 FOLFOX 09/08/2020 Cycle 4 FOLFOX 09/29/2020  Cycle 5 FOLFOX 10/20/2020 CTs 11/06/2020-grossly stable infiltrative mass within the pancreatic head and porta hepatis.  New small low-density liver lesions.  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 Cycle 6 FOLFOX 11/10/2021  Cycle 7 FOLFOX 12/01/2020 MRI liver 12/04/2020-no significant change in posttreatment appearance of the pancreatic head.  Numerous intrinsically low signal hypoenhancing lesions of the liver parenchyma predominantly observed in the right lobe of the liver and liver dome, measuring no greater than 8 mm and as seen on recent prior CT of the abdomen/pelvis. Cycle 8 FOLFOX 12/22/2020 Cycle 9 FOLFOX 01/12/2021 Cycle 10 FOLFOX 02/02/2021 MRI liver 02/19/2021-no change in pancreas head mass, multiple hypoenhancing liver lesions are new  and increased in size, effacement of portal vein by pancreas head mass with cavernous transformation, no change in mild splenomegaly Cycle 1 gemcitabine/Abraxane 03/12/2021 Cycle 2 gemcitabine/Abraxane  03/27/2021- dose reductions secondary to neutropenia Cycle 3 gemcitabine/Abraxane 04/17/2021-dose reductions due to thrombocytopenia, white cell growth factor support added Cycle 4 gemcitabine/Abraxane 05/01/2021, Ziextenzo Cycle 5 gemcitabine/Abraxane 05/15/2021, Ziextenzo MRI abdomen 05/26/2021-mild enlargement of dominant hepatic metastases, appearance of infiltrative pancreas mass extending to the celiac trunk and SMA, splenic vein occlusion, cavernous transformation of the portal vein with upper abdomen collaterals, splenomegaly, mild upper abdominal ascites and mesenteric edema, minimal subpleural nodularity left lower lobe Cycle 6 gemcitabine/Abraxane 05/28/2021 Cycle 7 gemcitabine/Abraxane 06/12/2021 Cycle 8 gemcitabine/Abraxane 06/26/2021 Cycle 9 gemcitabine/Abraxane 07/10/2021 Cycle 10 gemcitabine/Abraxane 07/23/2021 Cycle 11 gemcitabine/Abraxane 08/06/2021 MRI abdomen 08/20/2021-multifocal nodularity in the lower chest with a new discrete right lower lobe nodule, enlarging subsegment 7/8 liver lesion, other small lesions appear stable, gastric varices, review of images with radiology-enlargement of at least 2 liver lesions, suspicious nodule n the right lower lung   Chronic thrombocytopenia- weekly Nplate beginning 8/86/4847 Diabetes Hypertension Abdomen/back pain secondary to #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 Vanaman appears stable.  I reviewed the MRI findings with him.  It appears there is progressive disease in the liver while on gemcitabine/Abraxane.  We will follow-up on the CA 19-9 from today, I will review the images with a radiologist, and we will obtain a staging chest CT.  Treatment will be placed on hold.  Mr. Sigg will return for an office visit and further discussion on 09/02/2021.  We discussed salvage treatment  options including treatment with FOLFIRI.  Betsy Coder, MD  08/20/2021  10:04 AM

## 2021-08-21 ENCOUNTER — Inpatient Hospital Stay: Payer: Medicare Other

## 2021-08-21 LAB — CANCER ANTIGEN 19-9: CA 19-9: 3411 U/mL — ABNORMAL HIGH (ref 0–35)

## 2021-08-25 ENCOUNTER — Other Ambulatory Visit: Payer: Self-pay

## 2021-08-25 DIAGNOSIS — C250 Malignant neoplasm of head of pancreas: Secondary | ICD-10-CM

## 2021-08-26 ENCOUNTER — Other Ambulatory Visit: Payer: Self-pay

## 2021-08-26 ENCOUNTER — Ambulatory Visit (HOSPITAL_BASED_OUTPATIENT_CLINIC_OR_DEPARTMENT_OTHER)
Admission: RE | Admit: 2021-08-26 | Discharge: 2021-08-26 | Disposition: A | Discharge status: Home / Self Care | Payer: Medicare Other | Source: Ambulatory Visit | Attending: Oncology | Admitting: Oncology

## 2021-08-26 DIAGNOSIS — C250 Malignant neoplasm of head of pancreas: Secondary | ICD-10-CM

## 2021-08-26 DIAGNOSIS — K921 Melena: Secondary | ICD-10-CM | POA: Diagnosis not present

## 2021-08-26 DIAGNOSIS — D689 Coagulation defect, unspecified: Secondary | ICD-10-CM | POA: Diagnosis not present

## 2021-08-27 ENCOUNTER — Emergency Department (HOSPITAL_BASED_OUTPATIENT_CLINIC_OR_DEPARTMENT_OTHER): Payer: Medicare Other

## 2021-08-27 ENCOUNTER — Inpatient Hospital Stay (HOSPITAL_COMMUNITY): Payer: Medicare Other | Admitting: Anesthesiology

## 2021-08-27 ENCOUNTER — Encounter (HOSPITAL_BASED_OUTPATIENT_CLINIC_OR_DEPARTMENT_OTHER): Payer: Self-pay

## 2021-08-27 ENCOUNTER — Ambulatory Visit (HOSPITAL_BASED_OUTPATIENT_CLINIC_OR_DEPARTMENT_OTHER): Payer: Medicare Other

## 2021-08-27 ENCOUNTER — Inpatient Hospital Stay (HOSPITAL_BASED_OUTPATIENT_CLINIC_OR_DEPARTMENT_OTHER)
Admission: EM | Admit: 2021-08-27 | Discharge: 2021-09-01 | DRG: 377 | Disposition: A | Discharge status: Home / Self Care | Payer: Medicare Other | Attending: Family Medicine | Admitting: Family Medicine

## 2021-08-27 ENCOUNTER — Encounter (HOSPITAL_COMMUNITY)
Admission: EM | Disposition: A | Discharge status: Home / Self Care | Payer: Self-pay | Source: Home / Self Care | Attending: Family Medicine

## 2021-08-27 ENCOUNTER — Other Ambulatory Visit: Payer: Self-pay

## 2021-08-27 DIAGNOSIS — K219 Gastro-esophageal reflux disease without esophagitis: Secondary | ICD-10-CM | POA: Diagnosis present

## 2021-08-27 DIAGNOSIS — D539 Nutritional anemia, unspecified: Secondary | ICD-10-CM | POA: Diagnosis present

## 2021-08-27 DIAGNOSIS — K766 Portal hypertension: Secondary | ICD-10-CM | POA: Diagnosis present

## 2021-08-27 DIAGNOSIS — K921 Melena: Secondary | ICD-10-CM | POA: Diagnosis present

## 2021-08-27 DIAGNOSIS — R64 Cachexia: Secondary | ICD-10-CM | POA: Diagnosis present

## 2021-08-27 DIAGNOSIS — K8020 Calculus of gallbladder without cholecystitis without obstruction: Secondary | ICD-10-CM | POA: Diagnosis present

## 2021-08-27 DIAGNOSIS — D638 Anemia in other chronic diseases classified elsewhere: Secondary | ICD-10-CM | POA: Diagnosis present

## 2021-08-27 DIAGNOSIS — T451X5A Adverse effect of antineoplastic and immunosuppressive drugs, initial encounter: Secondary | ICD-10-CM | POA: Diagnosis present

## 2021-08-27 DIAGNOSIS — E785 Hyperlipidemia, unspecified: Secondary | ICD-10-CM | POA: Diagnosis present

## 2021-08-27 DIAGNOSIS — C787 Secondary malignant neoplasm of liver and intrahepatic bile duct: Secondary | ICD-10-CM | POA: Diagnosis present

## 2021-08-27 DIAGNOSIS — K922 Gastrointestinal hemorrhage, unspecified: Secondary | ICD-10-CM | POA: Diagnosis not present

## 2021-08-27 DIAGNOSIS — D62 Acute posthemorrhagic anemia: Secondary | ICD-10-CM | POA: Diagnosis present

## 2021-08-27 DIAGNOSIS — G8929 Other chronic pain: Secondary | ICD-10-CM | POA: Diagnosis present

## 2021-08-27 DIAGNOSIS — I864 Gastric varices: Secondary | ICD-10-CM | POA: Diagnosis present

## 2021-08-27 DIAGNOSIS — I8500 Esophageal varices without bleeding: Secondary | ICD-10-CM | POA: Diagnosis present

## 2021-08-27 DIAGNOSIS — D689 Coagulation defect, unspecified: Secondary | ICD-10-CM | POA: Diagnosis present

## 2021-08-27 DIAGNOSIS — D6481 Anemia due to antineoplastic chemotherapy: Secondary | ICD-10-CM | POA: Diagnosis present

## 2021-08-27 DIAGNOSIS — M549 Dorsalgia, unspecified: Secondary | ICD-10-CM | POA: Diagnosis not present

## 2021-08-27 DIAGNOSIS — Z808 Family history of malignant neoplasm of other organs or systems: Secondary | ICD-10-CM

## 2021-08-27 DIAGNOSIS — R109 Unspecified abdominal pain: Secondary | ICD-10-CM | POA: Diagnosis not present

## 2021-08-27 DIAGNOSIS — K449 Diaphragmatic hernia without obstruction or gangrene: Secondary | ICD-10-CM | POA: Diagnosis present

## 2021-08-27 DIAGNOSIS — I10 Essential (primary) hypertension: Secondary | ICD-10-CM | POA: Diagnosis present

## 2021-08-27 DIAGNOSIS — Z79899 Other long term (current) drug therapy: Secondary | ICD-10-CM

## 2021-08-27 DIAGNOSIS — K529 Noninfective gastroenteritis and colitis, unspecified: Secondary | ICD-10-CM

## 2021-08-27 DIAGNOSIS — G620 Drug-induced polyneuropathy: Secondary | ICD-10-CM | POA: Diagnosis present

## 2021-08-27 DIAGNOSIS — C7801 Secondary malignant neoplasm of right lung: Secondary | ICD-10-CM | POA: Diagnosis present

## 2021-08-27 DIAGNOSIS — D696 Thrombocytopenia, unspecified: Secondary | ICD-10-CM | POA: Diagnosis present

## 2021-08-27 DIAGNOSIS — Z7901 Long term (current) use of anticoagulants: Secondary | ICD-10-CM

## 2021-08-27 DIAGNOSIS — C259 Malignant neoplasm of pancreas, unspecified: Secondary | ICD-10-CM

## 2021-08-27 DIAGNOSIS — C250 Malignant neoplasm of head of pancreas: Secondary | ICD-10-CM | POA: Diagnosis present

## 2021-08-27 DIAGNOSIS — H409 Unspecified glaucoma: Secondary | ICD-10-CM | POA: Diagnosis present

## 2021-08-27 DIAGNOSIS — Z20822 Contact with and (suspected) exposure to covid-19: Secondary | ICD-10-CM | POA: Diagnosis present

## 2021-08-27 DIAGNOSIS — C7951 Secondary malignant neoplasm of bone: Secondary | ICD-10-CM | POA: Diagnosis present

## 2021-08-27 DIAGNOSIS — Z87891 Personal history of nicotine dependence: Secondary | ICD-10-CM

## 2021-08-27 DIAGNOSIS — I81 Portal vein thrombosis: Secondary | ICD-10-CM | POA: Diagnosis present

## 2021-08-27 DIAGNOSIS — E43 Unspecified severe protein-calorie malnutrition: Secondary | ICD-10-CM | POA: Diagnosis present

## 2021-08-27 DIAGNOSIS — D709 Neutropenia, unspecified: Secondary | ICD-10-CM | POA: Diagnosis present

## 2021-08-27 DIAGNOSIS — Z800 Family history of malignant neoplasm of digestive organs: Secondary | ICD-10-CM

## 2021-08-27 DIAGNOSIS — E119 Type 2 diabetes mellitus without complications: Secondary | ICD-10-CM | POA: Diagnosis present

## 2021-08-27 DIAGNOSIS — Z6820 Body mass index (BMI) 20.0-20.9, adult: Secondary | ICD-10-CM

## 2021-08-27 DIAGNOSIS — K648 Other hemorrhoids: Secondary | ICD-10-CM | POA: Diagnosis present

## 2021-08-27 DIAGNOSIS — Z794 Long term (current) use of insulin: Secondary | ICD-10-CM

## 2021-08-27 DIAGNOSIS — Z803 Family history of malignant neoplasm of breast: Secondary | ICD-10-CM

## 2021-08-27 DIAGNOSIS — G893 Neoplasm related pain (acute) (chronic): Secondary | ICD-10-CM | POA: Diagnosis not present

## 2021-08-27 DIAGNOSIS — Z7984 Long term (current) use of oral hypoglycemic drugs: Secondary | ICD-10-CM

## 2021-08-27 DIAGNOSIS — Z833 Family history of diabetes mellitus: Secondary | ICD-10-CM

## 2021-08-27 HISTORY — PX: ESOPHAGOGASTRODUODENOSCOPY (EGD) WITH PROPOFOL: SHX5813

## 2021-08-27 LAB — HEMOGLOBIN AND HEMATOCRIT, BLOOD
HCT: 20.8 % — ABNORMAL LOW (ref 39.0–52.0)
HCT: 29.8 % — ABNORMAL LOW (ref 39.0–52.0)
HCT: 26.5 % — ABNORMAL LOW (ref 39.0–52.0)
Hemoglobin: 6.5 g/dL — CL (ref 13.0–17.0)
Hemoglobin: 9.3 g/dL — ABNORMAL LOW (ref 13.0–17.0)
Hemoglobin: 8.5 g/dL — ABNORMAL LOW (ref 13.0–17.0)

## 2021-08-27 LAB — COMPREHENSIVE METABOLIC PANEL
ALT: 45 U/L — ABNORMAL HIGH (ref 0–44)
ALT: 50 U/L — ABNORMAL HIGH (ref 0–44)
AST: 39 U/L (ref 15–41)
AST: 39 U/L (ref 15–41)
Albumin: 3.2 g/dL — ABNORMAL LOW (ref 3.5–5.0)
Albumin: 3.3 g/dL — ABNORMAL LOW (ref 3.5–5.0)
Alkaline Phosphatase: 234 U/L — ABNORMAL HIGH (ref 38–126)
Alkaline Phosphatase: 236 U/L — ABNORMAL HIGH (ref 38–126)
Anion gap: 7 (ref 5–15)
Anion gap: 6 (ref 5–15)
BUN: 20 mg/dL (ref 8–23)
BUN: 22 mg/dL (ref 8–23)
CO2: 16 mmol/L — ABNORMAL LOW (ref 22–32)
CO2: 18 mmol/L — ABNORMAL LOW (ref 22–32)
Calcium: 7.2 mg/dL — ABNORMAL LOW (ref 8.9–10.3)
Calcium: 8.4 mg/dL — ABNORMAL LOW (ref 8.9–10.3)
Chloride: 117 mmol/L — ABNORMAL HIGH (ref 98–111)
Chloride: 115 mmol/L — ABNORMAL HIGH (ref 98–111)
Creatinine, Ser: 0.92 mg/dL (ref 0.61–1.24)
Creatinine, Ser: 1.1 mg/dL (ref 0.61–1.24)
GFR, Estimated: >60 mL/min (ref >60)
GFR, Estimated: >60 mL/min (ref >60)
Glucose, Bld: 165 mg/dL — ABNORMAL HIGH (ref 70–99)
Glucose, Bld: 95 mg/dL (ref 70–99)
Potassium: 4 mmol/L (ref 3.5–5.1)
Potassium: 4.4 mmol/L (ref 3.5–5.1)
Sodium: 140 mmol/L (ref 135–145)
Sodium: 139 mmol/L (ref 135–145)
Total Bilirubin: 0.5 mg/dL (ref 0.3–1.2)
Total Bilirubin: 0.6 mg/dL (ref 0.3–1.2)
Total Protein: 5.4 g/dL — ABNORMAL LOW (ref 6.5–8.1)
Total Protein: 5.7 g/dL — ABNORMAL LOW (ref 6.5–8.1)

## 2021-08-27 LAB — OCCULT BLOOD X 1 CARD TO LAB, STOOL: Fecal Occult Bld: POSITIVE — AB

## 2021-08-27 LAB — BASIC METABOLIC PANEL
Anion gap: 5 (ref 5–15)
BUN: 22 mg/dL (ref 8–23)
CO2: 18 mmol/L — ABNORMAL LOW (ref 22–32)
Calcium: 8.6 mg/dL — ABNORMAL LOW (ref 8.9–10.3)
Chloride: 119 mmol/L — ABNORMAL HIGH (ref 98–111)
Creatinine, Ser: 1.16 mg/dL (ref 0.61–1.24)
GFR, Estimated: >60 mL/min (ref >60)
Glucose, Bld: 127 mg/dL — ABNORMAL HIGH (ref 70–99)
Potassium: 4.7 mmol/L (ref 3.5–5.1)
Sodium: 142 mmol/L (ref 135–145)

## 2021-08-27 LAB — CBC WITH DIFFERENTIAL/PLATELET
Abs Immature Granulocytes: 0.09 10*3/uL — ABNORMAL HIGH (ref 0.00–0.07)
Basophils Absolute: 0.1 10*3/uL (ref 0.0–0.1)
Basophils Relative: 1 %
Eosinophils Absolute: 0.3 10*3/uL (ref 0.0–0.5)
Eosinophils Relative: 3 %
HCT: 19.7 % — ABNORMAL LOW (ref 39.0–52.0)
Hemoglobin: 6.3 g/dL — CL (ref 13.0–17.0)
Immature Granulocytes: 1 %
Lymphocytes Relative: 8 %
Lymphs Abs: 0.7 10*3/uL (ref 0.7–4.0)
MCH: 32.5 pg (ref 26.0–34.0)
MCHC: 32 g/dL (ref 30.0–36.0)
MCV: 101.5 fL — ABNORMAL HIGH (ref 80.0–100.0)
Monocytes Absolute: 0.5 10*3/uL (ref 0.1–1.0)
Monocytes Relative: 6 %
Neutro Abs: 7.1 10*3/uL (ref 1.7–7.7)
Neutrophils Relative %: 81 %
Platelets: 172 10*3/uL (ref 150–400)
RBC: 1.94 MIL/uL — ABNORMAL LOW (ref 4.22–5.81)
RDW: 16.7 % — ABNORMAL HIGH (ref 11.5–15.5)
WBC: 8.7 10*3/uL (ref 4.0–10.5)
nRBC: 0 % (ref 0.0–0.2)

## 2021-08-27 LAB — IRON AND TIBC
Iron: 36 ug/dL — ABNORMAL LOW (ref 45–182)
Saturation Ratios: 15 % — ABNORMAL LOW (ref 17.9–39.5)
TIBC: 241 ug/dL — ABNORMAL LOW (ref 250–450)
UIBC: 205 ug/dL

## 2021-08-27 LAB — HEMOGLOBIN A1C
Hgb A1c MFr Bld: 6.1 % — ABNORMAL HIGH (ref 4.8–5.6)
Mean Plasma Glucose: 128.37 mg/dL

## 2021-08-27 LAB — PROTIME-INR
INR: >10 (ref 0.8–1.2)
INR: 1.2 (ref 0.8–1.2)
Prothrombin Time: >90 seconds — ABNORMAL HIGH (ref 11.4–15.2)
Prothrombin Time: 15.3 seconds — ABNORMAL HIGH (ref 11.4–15.2)

## 2021-08-27 LAB — GLUCOSE, CAPILLARY
Glucose-Capillary: 90 mg/dL (ref 70–99)
Glucose-Capillary: 79 mg/dL (ref 70–99)
Glucose-Capillary: 142 mg/dL — ABNORMAL HIGH (ref 70–99)

## 2021-08-27 LAB — CBC
HCT: 21.7 % — ABNORMAL LOW (ref 39.0–52.0)
Hemoglobin: 6.6 g/dL — CL (ref 13.0–17.0)
MCH: 33 pg (ref 26.0–34.0)
MCHC: 30.4 g/dL (ref 30.0–36.0)
MCV: 108.5 fL — ABNORMAL HIGH (ref 80.0–100.0)
Platelets: 169 10*3/uL (ref 150–400)
RBC: 2 MIL/uL — ABNORMAL LOW (ref 4.22–5.81)
RDW: 17.1 % — ABNORMAL HIGH (ref 11.5–15.5)
WBC: 8.4 10*3/uL (ref 4.0–10.5)
nRBC: 0 % (ref 0.0–0.2)

## 2021-08-27 LAB — RETICULOCYTES
Immature Retic Fract: 14.3 % (ref 2.3–15.9)
RBC.: 2.03 MIL/uL — ABNORMAL LOW (ref 4.22–5.81)
Retic Count, Absolute: 33.7 10*3/uL (ref 19.0–186.0)
Retic Ct Pct: 1.7 % (ref 0.4–3.1)

## 2021-08-27 LAB — FOLATE: Folate: 14 ng/mL (ref >5.9)

## 2021-08-27 LAB — FERRITIN: Ferritin: 321 ng/mL (ref 24–336)

## 2021-08-27 LAB — PREPARE RBC (CROSSMATCH)

## 2021-08-27 LAB — RESP PANEL BY RT-PCR (FLU A&B, COVID) ARPGX2
Influenza A by PCR: NEGATIVE
Influenza B by PCR: NEGATIVE
SARS Coronavirus 2 by RT PCR: NEGATIVE

## 2021-08-27 LAB — VITAMIN B12: Vitamin B-12: 1947 pg/mL — ABNORMAL HIGH (ref 180–914)

## 2021-08-27 SURGERY — ESOPHAGOGASTRODUODENOSCOPY (EGD) WITH PROPOFOL
Anesthesia: Monitor Anesthesia Care

## 2021-08-27 MED ORDER — IOHEXOL 350 MG/ML SOLN
100.0000 mL | Freq: Once | INTRAVENOUS | Status: AC | PRN
  Administered 2021-08-27: 100 mL via INTRAVENOUS

## 2021-08-27 MED ORDER — INSULIN ASPART 100 UNIT/ML IJ SOLN
0.0000 [IU] | Freq: Three times a day (TID) | INTRAMUSCULAR | Status: DC
Start: 2021-08-27 — End: 2021-09-02
  Administered 2021-08-28 – 2021-09-01 (×7): 1 [IU] via SUBCUTANEOUS

## 2021-08-27 MED ORDER — SODIUM CHLORIDE 0.9% FLUSH
10.0000 mL | Freq: Two times a day (BID) | INTRAVENOUS | Status: DC
  Administered 2021-08-29 – 2021-08-31 (×4): 10 mL

## 2021-08-27 MED ORDER — SODIUM CHLORIDE 0.9 % IV SOLN: INTRAVENOUS | Status: DC

## 2021-08-27 MED ORDER — SODIUM CHLORIDE 0.9% FLUSH: 10.0000 mL | INTRAVENOUS | Status: DC | PRN

## 2021-08-27 MED ORDER — MORPHINE SULFATE (PF) 4 MG/ML IV SOLN: 4.0000 mg | INTRAVENOUS | Status: DC | PRN

## 2021-08-27 MED ORDER — VITAMIN K1 10 MG/ML IJ SOLN
INTRAMUSCULAR | Status: AC
  Filled 2021-08-27: qty 1

## 2021-08-27 MED ORDER — VITAMIN K1 10 MG/ML IJ SOLN
10.0000 mg | Freq: Once | INTRAVENOUS | Status: AC
  Administered 2021-08-27: 10 mg via INTRAVENOUS
  Filled 2021-08-27: qty 1

## 2021-08-27 MED ORDER — PANTOPRAZOLE 80MG IVPB - SIMPLE MED
80.0000 mg | Freq: Once | INTRAVENOUS | Status: AC
  Administered 2021-08-27: 80 mg via INTRAVENOUS
  Filled 2021-08-27: qty 100

## 2021-08-27 MED ORDER — LACTATED RINGERS IV SOLN: INTRAVENOUS | Status: DC

## 2021-08-27 MED ORDER — PIPERACILLIN-TAZOBACTAM 3.375 G IVPB 30 MIN
3.3750 g | Freq: Once | INTRAVENOUS | Status: AC
  Administered 2021-08-27: 3.375 g via INTRAVENOUS
  Filled 2021-08-27: qty 50

## 2021-08-27 MED ORDER — SODIUM CHLORIDE 0.9% IV SOLUTION: Freq: Once | INTRAVENOUS | Status: AC

## 2021-08-27 MED ORDER — SODIUM CHLORIDE 0.9 % IV SOLN: Freq: Once | INTRAVENOUS | Status: AC

## 2021-08-27 MED ORDER — PHENYLEPHRINE HCL (PRESSORS) 10 MG/ML IV SOLN
INTRAVENOUS | Status: DC | PRN
  Administered 2021-08-27: 80 ug via INTRAVENOUS
  Administered 2021-08-27: 120 ug via INTRAVENOUS

## 2021-08-27 MED ORDER — PANTOPRAZOLE INFUSION (NEW) - SIMPLE MED
8.0000 mg/h | INTRAVENOUS | Status: DC
  Administered 2021-08-27 (×2): 8 mg/h via INTRAVENOUS
  Filled 2021-08-27 (×2): qty 100

## 2021-08-27 MED ORDER — PANTOPRAZOLE SODIUM 40 MG IV SOLR
INTRAVENOUS | Status: AC
  Filled 2021-08-27: qty 80

## 2021-08-27 MED ORDER — PROPOFOL 500 MG/50ML IV EMUL
INTRAVENOUS | Status: DC | PRN
  Administered 2021-08-27: 125 ug/kg/min via INTRAVENOUS

## 2021-08-27 MED ORDER — PANTOPRAZOLE SODIUM 40 MG PO TBEC
40.0000 mg | DELAYED_RELEASE_TABLET | Freq: Every day | ORAL | Status: DC
  Administered 2021-08-27 – 2021-09-01 (×6): 40 mg via ORAL
  Filled 2021-08-27 (×7): qty 1

## 2021-08-27 MED ORDER — ONDANSETRON HCL 4 MG/2ML IJ SOLN
4.0000 mg | Freq: Four times a day (QID) | INTRAMUSCULAR | Status: DC | PRN
  Administered 2021-08-30: 4 mg via INTRAVENOUS
  Filled 2021-08-27: qty 2

## 2021-08-27 MED ORDER — ONDANSETRON HCL 4 MG PO TABS: 4.0000 mg | ORAL_TABLET | Freq: Four times a day (QID) | ORAL | Status: DC | PRN

## 2021-08-27 MED ORDER — CHLORHEXIDINE GLUCONATE CLOTH 2 % EX PADS
6.0000 | MEDICATED_PAD | Freq: Every day | CUTANEOUS | Status: DC
  Administered 2021-08-27 – 2021-09-01 (×6): 6 via TOPICAL

## 2021-08-27 MED ORDER — PANTOPRAZOLE SODIUM 40 MG IV SOLR: 40.0000 mg | Freq: Two times a day (BID) | INTRAVENOUS | Status: DC

## 2021-08-27 MED ORDER — SODIUM CHLORIDE 0.9 % IV BOLUS
1000.0000 mL | Freq: Once | INTRAVENOUS | Status: AC
  Administered 2021-08-27: 1000 mL via INTRAVENOUS

## 2021-08-27 SURGICAL SUPPLY — 15 items

## 2021-08-27 NOTE — Anesthesia Postprocedure Evaluation (Signed)
Anesthesia Post Note  Patient: Aaron Johnston  Procedure(s) Performed: ESOPHAGOGASTRODUODENOSCOPY (EGD) WITH PROPOFOL     Patient location during evaluation: PACU Anesthesia Type: MAC Level of consciousness: awake and alert, oriented and patient cooperative Pain management: pain level controlled Vital Signs Assessment: post-procedure vital signs reviewed and stable Respiratory status: spontaneous breathing, nonlabored ventilation and respiratory function stable Cardiovascular status: blood pressure returned to baseline and stable Postop Assessment: no apparent nausea or vomiting Anesthetic complications: no   No notable events documented.  Last Vitals:  Vitals:   08/27/21 1530 08/27/21 1532  BP: (!) 86/68 (!) 132/57  Pulse: 76 75  Resp: 17 19  Temp:    SpO2: 100% 99%    Last Pain:  Vitals:   08/27/21 1530  TempSrc:   PainSc: 0-No pain                 Pervis Hocking

## 2021-08-27 NOTE — ED Notes (Signed)
[facility] called for Hospitalist Consultation to MD Palumbo.

## 2021-08-27 NOTE — ED Provider Notes (Signed)
[place] EMERGENCY DEPT Provider Note   CSN: [id number] Arrival date & time: 08/27/21  0221     History Chief Complaint  Patient presents with   Rectal Bleeding    Aaron Johnston is a 75 y.o. male.  The history is provided by the patient and the spouse. The history is limited by the condition of the patient.  Rectal Bleeding Quality:  Bright red and black and tarry Amount:  Moderate Duration: 3 epidsodes of watery stool that is both black and red. Context: not constipation and not rectal pain   Similar prior episodes: no   Relieved by:  Nothing Worsened by:  Nothing Ineffective treatments:  None tried Associated symptoms: recent illness   Associated symptoms: no abdominal pain, no fever and no loss of consciousness   Risk factors comment:  Pancreatic cancer currently being treated at [facility]     Past Medical History:  Diagnosis Date   Diabetes mellitus without complication ([place])    Family history of breast cancer    Family history of colon cancer    Family history of pancreatic cancer    Family history of stomach cancer     Patient Active Problem List   Diagnosis Date Noted   GI bleed 08/27/2021   Genetic testing 08/28/2020   Family history of breast cancer    Family history of pancreatic cancer    Family history of colon cancer    Family history of stomach cancer    Port-A-Cath in place 08/11/2020   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 No pertinent surgical history.     Family History  Problem Relation Age of Onset   Diabetes Mother    Pancreatic cancer Father        d. 41   Breast cancer Sister 47   Diabetes Sister    Diabetes Brother    Stomach cancer Paternal Aunt        2 pat aunts with stomach cancer   Colon cancer Paternal Uncle    Cancer Paternal Grandfather        NOS   Diabetes Sister    Diabetes Brother    Brain cancer  Paternal Aunt    Cancer Paternal Aunt        eye   Diabetes Son     Social History   Tobacco Use   Smoking status: Never   Smokeless tobacco: Never  Substance Use Topics   Alcohol use: Yes   Drug use: Never    Home Medications Prior to Admission medications   Medication Sig Start Date End Date Taking? Authorizing Provider  Accu-Chek FastClix Lancets MISC Apply topically. 08/30/20   [provider]  ACCU-CHEK GUIDE test strip  08/31/20   [provider]  Blood Glucose Monitoring Suppl (ACCU-CHEK GUIDE) w/Device KIT See admin instructions. 08/03/20   [provider]  diphenhydrAMINE (BENADRYL) 50 MG capsule Take by mouth.    [provider]  diphenoxylate-atropine (LOMOTIL) 2.5-0.025 MG tablet Take 1-2 tablets by mouth 4 (four) times daily as needed for diarrhea or loose stools. 08/06/21   Owens Shark, NP  famotidine (PEPCID) 10 MG tablet Take 10 mg by mouth daily as needed. Takes x 1 week after each tx for itching    [provider]  glucosamine-chondroitin 500-400 MG tablet Take 1 tablet by mouth daily.    [provider]  HYDROcodone-acetaminophen (NORCO/VICODIN) 5-325 MG tablet  Take 1-2 tablets by mouth every 4 (four) hours as needed. 03/12/21   Owens Shark, NP  Insulin Lispro (HUMALOG KWIKPEN [place]) Inject into the skin 2 (two) times daily as needed. 10 units for cbg > 300 and 15 units for cbg > 350 Patient not taking: No sig reported 07/28/20   [name], [name], MD  latanoprost (XALATAN) 0.005 % ophthalmic solution Place 1 drop into both eyes at bedtime.    [provider]  lidocaine-prilocaine (EMLA) cream Apply 1 application topically as directed. Apply to port site 1 hour prior to stick and cover with plastic wrap 07/23/20   Ladell Pier, MD  lipase/protease/amylase (CREON) 36000 UNITS CPEP capsule Take 2 capsules (72,000 Units total) by mouth 3 (three) times daily with meals. May also take 1 capsule (36,000 Units  total) as needed. 08/06/21   Owens Shark, NP  loperamide (IMODIUM) 2 MG capsule Take 2-4 mg by mouth as needed for diarrhea or loose stools.    [provider]  loratadine (CLARITIN) 10 MG tablet Take by mouth.    [provider]  magnesium oxide (MAG-OX) 400 MG tablet Take 1 tablet (400 mg total) by mouth 2 (two) times daily. 06/26/21   Owens Shark, NP  ondansetron (ZOFRAN) 8 MG tablet Take 1 tablet (8 mg total) by mouth every 8 (eight) hours as needed for nausea or vomiting. Patient not taking: No sig reported 07/23/20   Ladell Pier, MD  pantoprazole (PROTONIX) 40 MG tablet Take 40 mg by mouth 2 (two) times daily. 08/04/20   [provider]  pioglitazone (ACTOS) 45 MG tablet Take 45 mg by mouth daily. 12/28/13   [provider]  potassium chloride SA (KLOR-CON M20) 20 MEQ tablet Take 1 tablet (20 mEq total) by mouth daily. 08/07/21   Ladell Pier, MD  prochlorperazine (COMPAZINE) 10 MG tablet Take 1 tablet (10 mg total) by mouth every 6 (six) hours as needed for nausea. Patient not taking: No sig reported 07/23/20   Ladell Pier, MD  ramipril (ALTACE) 5 MG capsule Take 5 mg by mouth daily. Patient not taking: No sig reported 02/02/21   [provider]  simvastatin (ZOCOR) 80 MG tablet Take 0.5 tablets by mouth every evening. Patient not taking: No sig reported 02/08/19   [provider]  sitaGLIPtin (JANUVIA) 100 MG tablet Take 100 mg by mouth daily. Patient not taking: No sig reported 12/27/13   [provider]  tadalafil (CIALIS) 20 MG tablet Take by mouth. 06/11/21   [provider]    Allergies    Patient has no known allergies.  Review of Systems   Review of Systems  Unable to perform ROS: Acuity of condition  Constitutional:  Negative for fever.  HENT:  Negative for facial swelling.   Eyes:  Negative for redness.  Respiratory:  Negative for stridor.   Cardiovascular:  Negative for leg swelling.   Gastrointestinal:  Positive for hematochezia. Negative for abdominal pain.  Musculoskeletal:  Negative for neck stiffness.  Skin:  Negative for rash.  Neurological:  Negative for loss of consciousness and facial asymmetry.  Psychiatric/Behavioral:  Negative for agitation.    Physical Exam Updated Vital Signs BP (!) 103/56   Pulse 72   Temp 98.5 F (36.9 C) (Oral)   Resp 16   Ht _0  (1.753 m)   Wt 62 kg   SpO2 100%   BMI 20.18 kg/m   Physical Exam Vitals and nursing note reviewed.  Exam conducted with a chaperone present.  Constitutional:      Appearance: Normal appearance. He is not diaphoretic.  HENT:     Head: Normocephalic and atraumatic.     Nose: Nose normal.  Eyes:     Pupils: Pupils are equal, round, and reactive to light.     Comments: Pale   Cardiovascular:     Rate and Rhythm: Normal rate and regular rhythm.     Pulses: Normal pulses.     Heart sounds: Normal heart sounds.  Pulmonary:     Effort: Pulmonary effort is normal.     Breath sounds: Normal breath sounds.  Abdominal:     General: Abdomen is flat. Bowel sounds are normal.     Palpations: Abdomen is soft.     Tenderness: There is no abdominal tenderness.  Genitourinary:    Rectum: Guaiac result positive.  Musculoskeletal:     Cervical back: Normal range of motion and neck supple.  Skin:    General: Skin is warm and dry.     Capillary Refill: Capillary refill takes less than 2 seconds.     Coloration: Skin is pale.  Neurological:     General: No focal deficit present.     Mental Status: He is alert and oriented to person, place, and time.     Deep Tendon Reflexes: Reflexes normal.  Psychiatric:        Mood and Affect: Mood normal.        Behavior: Behavior normal.    ED Results / Procedures / Treatments   Labs (all labs ordered are listed, but only abnormal results are displayed) Results for orders placed or performed during the hospital encounter of 08/27/21  Resp Panel by RT-PCR (Flu  A&B, Covid) Nasopharyngeal Swab   Specimen: Nasopharyngeal Swab; Nasopharyngeal(NP) swabs in vial transport medium  Result Value Ref Range   SARS Coronavirus 2 by RT PCR NEGATIVE NEGATIVE   Influenza A by PCR NEGATIVE NEGATIVE   Influenza B by PCR NEGATIVE NEGATIVE  Occult blood card to lab, stool  Result Value Ref Range   Fecal Occult Bld POSITIVE (A) NEGATIVE  CBC with Differential/Platelet  Result Value Ref Range   WBC 8.7 4.0 - 10.5 K/uL   RBC 1.94 (L) 4.22 - 5.81 MIL/uL   Hemoglobin 6.3 (LL) 13.0 - 17.0 g/dL   HCT 19.7 (L) 39.0 - 52.0 %   MCV 101.5 (H) 80.0 - 100.0 fL   MCH 32.5 26.0 - 34.0 pg   MCHC 32.0 30.0 - 36.0 g/dL   RDW 16.7 (H) 11.5 - 15.5 %   Platelets 172 150 - 400 K/uL   nRBC 0.0 0.0 - 0.2 %   Neutrophils Relative % 81 %   Neutro Abs 7.1 1.7 - 7.7 K/uL   Lymphocytes Relative 8 %   Lymphs Abs 0.7 0.7 - 4.0 K/uL   Monocytes Relative 6 %   Monocytes Absolute 0.5 0.1 - 1.0 K/uL   Eosinophils Relative 3 %   Eosinophils Absolute 0.3 0.0 - 0.5 K/uL   Basophils Relative 1 %   Basophils Absolute 0.1 0.0 - 0.1 K/uL   Immature Granulocytes 1 %   Abs Immature Granulocytes 0.09 (H) 0.00 - 0.07 K/uL  Comprehensive metabolic panel  Result Value Ref Range   Sodium 140 135 - 145 mmol/L   Potassium 4.0 3.5 - 5.1 mmol/L   Chloride 117 (H) 98 - 111 mmol/L   CO2 16 (L) 22 - 32 mmol/L   Glucose, Bld 165 (  H) 70 - 99 mg/dL   BUN 20 8 - 23 mg/dL   Creatinine, Ser 0.92 0.61 - 1.24 mg/dL   Calcium 7.2 (L) 8.9 - 10.3 mg/dL   Total Protein 5.4 (L) 6.5 - 8.1 g/dL   Albumin 3.2 (L) 3.5 - 5.0 g/dL   AST 39 15 - 41 U/L   ALT 45 (H) 0 - 44 U/L   Alkaline Phosphatase 234 (H) 38 - 126 U/L   Total Bilirubin 0.5 0.3 - 1.2 mg/dL   GFR, Estimated >60 >60 mL/min   Anion gap 7 5 - 15  Protime-INR  Result Value Ref Range   Prothrombin Time >90.0 (H) 11.4 - 15.2 seconds   INR >10.0 (HH) 0.8 - 1.2   MR LIVER W WO CONTRAST  Result Date: 08/20/2021 CLINICAL DATA:  A 75 year old male  presents with pancreatic cancer with metastatic disease for follow-up. EXAM: MRI ABDOMEN WITHOUT AND WITH CONTRAST TECHNIQUE: Multiplanar multisequence MR imaging of the abdomen was performed both before and after the administration of intravenous contrast. CONTRAST:  35m GADAVIST GADOBUTROL 1 MMOL/ML IV SOLN COMPARISON:  Comparison made with multiple prior imaging, most recent comparison imaging from May 26, 2021. FINDINGS: Lower chest: Multifocal nodularity is suspected in the lower chest. Discrete nodule along the pleural surface in the RIGHT lower lobe for instance (image 1/9) this was not present previously. On coronal images either airspace disease or pulmonary nodule (image 24/7) 20 x 13 mm as measured in the coronal plane. Adjacent nodularity makes this suspicious for metastatic process. There is no effusion in the lung bases. Multifocal nodularity suggested bilaterally at the lung bases on contrasted images, image 1-22 of series 25. Hepatobiliary: Attenuated/narrowed but nondilated biliary tree. Cholelithiasis without pericholecystic stranding. Sludge in the gallbladder. Similar appearance of pancreatic metastatic lesions in the liver as compared to previous imaging with respect to distribution. Enlarging lesion in hepatic subsegment VII/VIII (image 19/8 this measures 16 mm previously 5 mm. Other small lesions with similar appearance. 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 Occlusion of splenic vein and SMV as well with numerous upper abdominal collateral pathways. Pancreas: Ill-defined mass in the region of the pancreatic neck and body with encasement of the celiac, SMA and portal venous structures including SMV and splenic vein as described. Spleen:  Enlargement of the spleen now 16 cm. Adrenals/Urinary Tract: Adrenal glands are normal. Kidneys enhance symmetrically with renal cyst on the RIGHT. 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  No acute abdominal process to the extent evaluated on abdominal MRI not performed for bowel evaluation. Vascular involvement Vascular/Lymphatic:  From pancreatic neoplasm. Other:  No ascites Musculoskeletal: No suspicious bone lesions identified. IMPRESSION: Locally advanced and metastatic pancreatic cancer with signs of worsening of dominant lesions in the liver and with suspected pulmonary metastatic disease. Consider chest CT for further evaluation. Increasing size of the spleen, attention on follow-up now approximately 16 cm as compared to 15 cm greatest craniocaudal dimension. Gastric varices. Cholelithiasis and sludge with attenuation of the common bile duct but without central dilation, attention on follow-up. Electronically Signed   By: GZetta BillsM.D.   On: 08/20/2021 10:19   MR 3D Recon At Scanner  Result Date: 08/20/2021 CLINICAL DATA:  A 75year old male presents with pancreatic cancer with metastatic disease for follow-up. EXAM: MRI ABDOMEN WITHOUT AND WITH CONTRAST TECHNIQUE: Multiplanar multisequence MR imaging of the abdomen was performed both before and after the administration of intravenous contrast. CONTRAST:  677mGADAVIST GADOBUTROL 1  MMOL/ML IV SOLN COMPARISON:  Comparison made with multiple prior imaging, most recent comparison imaging from May 26, 2021. FINDINGS: Lower chest: Multifocal nodularity is suspected in the lower chest. Discrete nodule along the pleural surface in the RIGHT lower lobe for instance (image 1/9) this was not present previously. On coronal images either airspace disease or pulmonary nodule (image 24/7) 20 x 13 mm as measured in the coronal plane. Adjacent nodularity makes this suspicious for metastatic process. There is no effusion in the lung bases. Multifocal nodularity suggested bilaterally at the lung bases on contrasted images, image 1-22 of series 25. Hepatobiliary: Attenuated/narrowed but nondilated biliary tree. Cholelithiasis without pericholecystic  stranding. Sludge in the gallbladder. Similar appearance of pancreatic metastatic lesions in the liver as compared to previous imaging with respect to distribution. Enlarging lesion in hepatic subsegment VII/VIII (image 19/8 this measures 16 mm previously 5 mm. Other small lesions with similar appearance. 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 Occlusion of splenic vein and SMV as well with numerous upper abdominal collateral pathways. Pancreas: Ill-defined mass in the region of the pancreatic neck and body with encasement of the celiac, SMA and portal venous structures including SMV and splenic vein as described. Spleen:  Enlargement of the spleen now 16 cm. Adrenals/Urinary Tract: Adrenal glands are normal. Kidneys enhance symmetrically with renal cyst on the RIGHT. 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 No acute abdominal process to the extent evaluated on abdominal MRI not performed for bowel evaluation. Vascular involvement Vascular/Lymphatic:  From pancreatic neoplasm. Other:  No ascites Musculoskeletal: No suspicious bone lesions identified. IMPRESSION: Locally advanced and metastatic pancreatic cancer with signs of worsening of dominant lesions in the liver and with suspected pulmonary metastatic disease. Consider chest CT for further evaluation. Increasing size of the spleen, attention on follow-up now approximately 16 cm as compared to 15 cm greatest craniocaudal dimension. Gastric varices. Cholelithiasis and sludge with attenuation of the common bile duct but without central dilation, attention on follow-up. Electronically Signed   By: Zetta Bills M.D.   On: 08/20/2021 10:19   CT Angio Abd/Pel w/ and/or w/o  Result Date: 08/27/2021 CLINICAL DATA:  Acute, nonlocalized abdominal pain blood in stool yesterday EXAM: CT ANGIOGRAPHY ABDOMEN TECHNIQUE: Multidetector CT imaging of the abdomen was performed using the standard protocol  during bolus administration of intravenous contrast. Multiplanar reconstructed images and MIPs were obtained and reviewed to evaluate the vascular anatomy. CONTRAST:  147m OMNIPAQUE IOHEXOL 350 MG/ML SOLN COMPARISON:  MR of the abdomen from 8 days ago FINDINGS: VASCULAR Aorta: Diffuse atheromatous wall thickening of the aorta. Negative for aneurysm or dissection Celiac: Pancreatic mass with stable irregular and beaded appearance to the left gastric, splenic, and proximal hepatic arteries. No acute finding SMA: No acute finding or aneurysm. No evidence of angio dysplasia. No stenosis. Renals: Accessory right lower pole artery. No acute vascular finding. 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 Multiple varices in the upper abdomen including periesophageal and gastric. Splenomegaly. No acute venous occlusion is noted. Portal vein remains occluded beginning at the level of the infiltrative pancreas mass. Review of the MIP images confirms the above findings. NON-VASCULAR Lower chest: Extensive reticulonodular opacity at the bases. Staging chest CT was performed yesterday. Hepatobiliary: Low-density liver masses attributed to metastatic disease given change from 2021, recently evaluated by MRI. The largest is in the lower  central liver on 11:24 at 26 mm. Pancreas: Pancreas mass with background pancreas atrophy, mass difficult to measure given diffuse infiltrative appearance in the retroperitoneum. Spleen: Enlarged in the setting of portal hypertension. Adrenals/Urinary Tract: No hydronephrosis or adrenal mass. Stomach/Bowel: Moderate distension of the stomach by partially high-density semi-solid material. No active intraluminal bleeding is seen into stomach. There is submucosal low-density expansion of the ileum and proximal colon. Negative for bowel  obstruction. Additional area of thickening at the rectum where there are internal hemorrhoids associated with the portal hypertension. Lymphatic: No discrete and measurable lymphadenopathy. Other: No ascites or pneumoperitoneum Musculoskeletal: No acute or aggressive finding IMPRESSION: 1. The stomach is moderately distended, possible intraluminal clot if no recent meal. No active intraluminal hemorrhage detected. 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 2. Ileal and proximal colonic wall edema which could be colitis or portal congestion. 3. Infiltrating pancreas mass, reference recent staging MRI of the abdomen Electronically Signed   By: Jorje Guild M.D.   On: 08/27/2021 04:32     EKG None  Radiology CT Angio Abd/Pel w/ and/or w/o  Result Date: 08/27/2021 CLINICAL DATA:  Acute, nonlocalized abdominal pain blood in stool yesterday EXAM: CT ANGIOGRAPHY ABDOMEN TECHNIQUE: Multidetector CT imaging of the abdomen was performed using the standard protocol during bolus administration of intravenous contrast. Multiplanar reconstructed images and MIPs were obtained and reviewed to evaluate the vascular anatomy. CONTRAST:  148m OMNIPAQUE IOHEXOL 350 MG/ML SOLN COMPARISON:  MR of the abdomen from 8 days ago FINDINGS: VASCULAR Aorta: Diffuse atheromatous wall thickening of the aorta. Negative for aneurysm or dissection Celiac: Pancreatic mass with stable irregular and beaded appearance to the left gastric, splenic, and proximal hepatic arteries. No acute finding SMA: No acute finding or aneurysm. No evidence of angio dysplasia. No stenosis. Renals: Accessory right lower pole artery. No acute vascular finding. 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 Multiple varices in the upper  abdomen including periesophageal and gastric. Splenomegaly. No acute venous occlusion is noted. Portal vein remains occluded beginning at the level of the infiltrative pancreas mass. Review of the MIP images confirms the above findings. NON-VASCULAR Lower chest: Extensive reticulonodular opacity at the bases. Staging chest CT was performed yesterday. Hepatobiliary: Low-density liver masses attributed to metastatic disease given change from 2021, recently evaluated by MRI. The largest is in the lower central liver on 11:24 at 26 mm. Pancreas: Pancreas mass with background pancreas atrophy, mass difficult to measure given diffuse infiltrative appearance in the retroperitoneum. Spleen: Enlarged in the setting of portal hypertension. Adrenals/Urinary Tract: No hydronephrosis or adrenal mass. Stomach/Bowel: Moderate distension of the stomach by partially high-density semi-solid material. No active intraluminal bleeding is seen into stomach. There is submucosal low-density expansion of the ileum and proximal colon. Negative for bowel obstruction. Additional area of thickening at the rectum where there are internal hemorrhoids associated with the portal hypertension. Lymphatic: No discrete and measurable lymphadenopathy. Other: No ascites or pneumoperitoneum Musculoskeletal: No acute or aggressive finding IMPRESSION: 1. The stomach is moderately distended, possible intraluminal clot if no recent meal. No active intraluminal hemorrhage detected. 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 2. Ileal and proximal colonic wall edema which could be colitis or portal congestion. 3. Infiltrating pancreas mass, reference recent staging MRI of the abdomen Electronically Signed  By: Jorje Guild M.D.   On: 08/27/2021 04:32    Procedures Procedures   Medications Ordered in ED Medications  phytonadione (VITAMIN K) 10 mg in dextrose 5 % 50 mL IVPB (10 mg Intravenous  New Bag/Given 08/27/21 0400)  phytonadione (VITAMIN K) 10 MG/ML SQ injection (  Not Given 08/27/21 0439)  pantoprozole (PROTONIX) 80 mg /NS 100 mL infusion (8 mg/hr Intravenous New Bag/Given 08/27/21 0416)  pantoprazole (PROTONIX) injection 40 mg (has no administration in time range)  pantoprazole (PROTONIX) 40 MG injection (has no administration in time range)  pantoprazole (PROTONIX) 40 MG injection (has no administration in time range)  piperacillin-tazobactam (ZOSYN) IVPB 3.375 g (has no administration in time range)  sodium chloride 0.9 % bolus 1,000 mL (1,000 mLs Intravenous New Bag/Given 08/27/21 0359)  iohexol (OMNIPAQUE) 350 MG/ML injection 100 mL (100 mLs Intravenous Contrast Given 08/27/21 0345)  pantoprazole (PROTONIX) 80 mg /NS 100 mL IVPB (80 mg Intravenous New Bag/Given 08/27/21 0429)    ED Course  I have reviewed the triage vital signs and the nursing notes.  Pertinent labs & imaging results that were available during my care of the patient were reviewed by me and considered in my medical decision making (see chart for details).  MDM   Amount and/or Complexity of Data Reviewed Clinical lab tests: ordered and reviewed Discussion of test results with the performing providers: yes Decide to obtain previous medical records or to obtain history from someone other than the patient: yes Obtain history from someone other than the patient: yes Review and summarize past medical records: yes Discuss the patient with other providers: yes Independent visualization of images, tracings, or specimens: yes  Risk of Complications, Morbidity, and/or Mortality Presenting problems: high Diagnostic procedures: high Management options: high  Critical Care Total time providing critical care: 75-105 minutes (IVF, IV anbibiotics, IV Vitamin K)   MDM Reviewed: previous chart, nursing note and vitals Reviewed previous: labs Interpretation: labs and CT scan (low hemoglobin, coagulopathic  elevated PT and INR) Total time providing critical care: 75-105 minutes (IVF, IV anbibiotics, IV Vitamin K). This excludes time spent performing separately reportable procedures and services. Consults: admitting MD and gastrointestinal (secure chat sent to Dr. Watt Climes) CRITICAL CARE Performed by: Roma Bondar K Ronelle Smallman-Rasch Total critical care time: 75 minutes Critical care time was exclusive of separately billable procedures and treating other patients. Critical care was necessary to treat or prevent imminent or life-threatening deterioration. Critical care was time spent personally by me on the following activities: development of treatment plan with patient and/or surrogate as well as nursing, discussions with consultants, evaluation of patient's response to treatment, examination of patient, obtaining history from patient or surrogate, ordering and performing treatments and interventions, ordering and review of laboratory studies, ordering and review of radiographic studies, pulse oximetry and re-evaluation of patient's condition.      Final Clinical Impression(s) / ED Diagnoses Final diagnoses:  Gastrointestinal hemorrhage, unspecified gastrointestinal hemorrhage type  Malignant neoplasm of pancreas, unspecified location of malignancy ([place])  Coagulopathy ([place])   Admit to stepdown  Rx / DC Orders ED Discharge Orders     None        Nyaisha Simao, MD 08/27/21 0502

## 2021-08-27 NOTE — ED Triage Notes (Signed)
Pt reports blood in stool started yesterday -  LBM  was 30 mins ago - has hx pancreatic CA  and on  chemo tx. -  denies fever / SOB /CP / dizziness   GCS 15.  Pt came ambulatory.

## 2021-08-27 NOTE — Consult Note (Addendum)
Referring Provider: Dr. Cherylann Ratel Primary Care Physician:  [name], Carlean Jews.Marlou Sa, MD Primary Gastroenterologist:  Dr. Lawrence Santiago ([facility] specialist)  Reason for Consultation:  Anemia and rectal bleeding  HPI: Aaron Johnston is a 75 y.o. 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 Benay Spice.    Patient denies previous GI bleeds in the past. Patient states he has had diarrhea 4 to 5 times daily since being diagnosed with pancreatic cancer, currently on Creon, happens after eating.   Denies abdominal pain, fecal incontinence with the diarrhea. Patient states that he was in his usual state of health after lunch went to use the restroom, saw small black pieces of stool with dark to light small ~moderate volume of blood in the water. Patient states he had 4 stools with blood in them.  No BM since being the hospital, last BM last night at 1230AM. Denies rectal pain, abdominal pain, fever chills. Patient denies GERD, is on pantoprazole 40 mg twice daily and Pepcid as needed after infusions for itching. Denies nausea, vomiting, dysphagia. Patient states recently his weight has stabilized, denies recent weight loss.  No NSAIDS. Patient is not on blood thinners. Denies ETOH. Remote history of smoking.  Declines iron or pepto use.  Patient has family history of pancreatic cancer in father, family history of breast cancer.  Patient denies family history of colon cancer when asked.   Has done genetic counseling.  States he had a colonoscopy within last 10 years with Dr. Earlean Shawl but I am unable to find this report.    EUS 07/10/2020 at [place], unable to see report.   WBC 8.4 HGB 6.6 MCV 108.5 Platelets 169 No results for iron/ferritin/B12 BUN 6.6 Cr 1.16  GFR >60   CTA AB and Pelvis 08/27/21 IMPRESSION: 1. The stomach is moderately distended, possible  intraluminal clot if no recent meal. No active intraluminal hemorrhage detected. 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 2. Ileal and proximal colonic wall edema which could be colitis or portal congestion. 3. Infiltrating pancreas mass, reference recent staging MRI of the abdomen   MRCP 08/19/2021 IMPRESSION: Locally advanced and metastatic pancreatic cancer with signs of worsening of dominant lesions in the liver and with suspected pulmonary metastatic disease. Consider chest CT for further evaluation. Increasing size of the spleen, attention on follow-up now approximately 16 cm as compared to 15 cm greatest craniocaudal dimension. Gastric varices. Cholelithiasis and sludge with attenuation of the common bile duct but without central dilation, attention on follow-up.   Past Medical History:  Diagnosis Date   Diabetes mellitus without complication ([place])    Family history of breast cancer    Family history of colon cancer    Family history of pancreatic cancer    Family history of stomach cancer     History reviewed. No pertinent surgical history.  Prior to Admission medications   Medication Sig Start Date End Date Taking? Authorizing Provider  Accu-Chek FastClix Lancets MISC Apply topically. 08/30/20   [provider]  ACCU-CHEK GUIDE test strip  08/31/20   [provider]  Blood Glucose Monitoring Suppl (ACCU-CHEK GUIDE) w/Device KIT See admin instructions. 08/03/20   [provider]  diphenhydrAMINE (BENADRYL) 50 MG capsule Take by mouth.    [provider]  diphenoxylate-atropine (LOMOTIL) 2.5-0.025 MG tablet Take 1-2 tablets by mouth 4 (four) times daily as needed  for diarrhea or loose stools. 08/06/21   Owens Shark, NP  famotidine (PEPCID) 10 MG tablet Take 10 mg by mouth daily as needed. Takes x 1 week after each tx for itching    [provider]   glucosamine-chondroitin 500-400 MG tablet Take 1 tablet by mouth daily.    [provider]  HYDROcodone-acetaminophen (NORCO/VICODIN) 5-325 MG tablet Take 1-2 tablets by mouth every 4 (four) hours as needed. 03/12/21   Owens Shark, NP  Insulin Lispro (HUMALOG KWIKPEN [place]) Inject into the skin 2 (two) times daily as needed. 10 units for cbg > 300 and 15 units for cbg > 350 Patient not taking: No sig reported 07/28/20   [name], [name], MD  latanoprost (XALATAN) 0.005 % ophthalmic solution Place 1 drop into both eyes at bedtime.    [provider]  lidocaine-prilocaine (EMLA) cream Apply 1 application topically as directed. Apply to port site 1 hour prior to stick and cover with plastic wrap 07/23/20   Ladell Pier, MD  lipase/protease/amylase (CREON) 36000 UNITS CPEP capsule Take 2 capsules (72,000 Units total) by mouth 3 (three) times daily with meals. May also take 1 capsule (36,000 Units total) as needed. 08/06/21   Owens Shark, NP  loperamide (IMODIUM) 2 MG capsule Take 2-4 mg by mouth as needed for diarrhea or loose stools.    [provider]  loratadine (CLARITIN) 10 MG tablet Take by mouth.    [provider]  magnesium oxide (MAG-OX) 400 MG tablet Take 1 tablet (400 mg total) by mouth 2 (two) times daily. 06/26/21   Owens Shark, NP  ondansetron (ZOFRAN) 8 MG tablet Take 1 tablet (8 mg total) by mouth every 8 (eight) hours as needed for nausea or vomiting. Patient not taking: No sig reported 07/23/20   Ladell Pier, MD  pantoprazole (PROTONIX) 40 MG tablet Take 40 mg by mouth 2 (two) times daily. 08/04/20   [provider]  pioglitazone (ACTOS) 45 MG tablet Take 45 mg by mouth daily. 12/28/13   [provider]  potassium chloride SA (KLOR-CON M20) 20 MEQ tablet Take 1 tablet (20 mEq total) by mouth daily. 08/07/21   Ladell Pier, MD  prochlorperazine (COMPAZINE) 10 MG tablet Take 1 tablet (10 mg total) by mouth every 6 (six) hours  as needed for nausea. Patient not taking: No sig reported 07/23/20   Ladell Pier, MD  ramipril (ALTACE) 5 MG capsule Take 5 mg by mouth daily. Patient not taking: No sig reported 02/02/21   [provider]  simvastatin (ZOCOR) 80 MG tablet Take 0.5 tablets by mouth every evening. Patient not taking: No sig reported 02/08/19   [provider]  sitaGLIPtin (JANUVIA) 100 MG tablet Take 100 mg by mouth daily. Patient not taking: No sig reported 12/27/13   [provider]  tadalafil (CIALIS) 20 MG tablet Take by mouth. 06/11/21   [provider]    Scheduled Meds:  sodium chloride   Intravenous Once   [START ON 08/30/2021] pantoprazole  40 mg Intravenous Q12H   phytonadione       Continuous Infusions:  pantoprazole 8 mg/hr (08/27/21 0454)   PRN Meds:.  Allergies as of 08/27/2021   (No Known Allergies)    Family History  Problem Relation Age of Onset   Diabetes Mother    Pancreatic cancer Father        d. 33   Breast cancer Sister 55   Diabetes Sister  Diabetes Brother    Stomach cancer Paternal Aunt        2 pat aunts with stomach cancer   Colon cancer Paternal Uncle    Cancer Paternal Grandfather        NOS   Diabetes Sister    Diabetes Brother    Brain cancer Paternal Aunt    Cancer Paternal Aunt        eye   Diabetes Son     Social History   Socioeconomic History   Marital status: Married    Spouse name: Not on file   Number of children: Not on file   Years of education: Not on file   Highest education level: Not on file  Occupational History   Not on file  Tobacco Use   Smoking status: Never   Smokeless tobacco: Never  Substance and Sexual Activity   Alcohol use: Yes   Drug use: Never   Sexual activity: Not on file  Other Topics Concern   Not on file  Social History Narrative   Not on file   Social Determinants of Health   Financial Resource Strain: Not on file  Food Insecurity: Not on file  Transportation  Needs: Not on file  Physical Activity: Not on file  Stress: Not on file  Social Connections: Not on file  Intimate Partner Violence: Not on file    Review of Systems:  Review of Systems  Constitutional:  Positive for malaise/fatigue. Negative for chills, fever and weight loss.  Respiratory:  Negative for shortness of breath.   Cardiovascular:  Positive for leg swelling (mild bilateral per patient). Negative for chest pain.  Gastrointestinal:  Positive for blood in stool, diarrhea and melena. Negative for abdominal pain, constipation, heartburn, nausea and vomiting.  Musculoskeletal:  Negative for falls.  Skin:  Positive for itching (after infusions). Negative for rash.  Neurological:  Negative for dizziness and loss of consciousness.  Psychiatric/Behavioral:  Negative for memory loss.     Physical Exam: Vital signs: Vitals:   08/27/21 0500 08/27/21 0548  BP: (!) 103/59 110/60  Pulse: 76 72  Resp: 15 18  Temp:  97.9 F (36.6 C)  SpO2: 100% 100%   Last BM Date: 08/27/21 Physical Exam  General:   Alert, cachectic male, in NAD Heart:  Regular rate and rhythm; no murmurs, left porta-cath in place Pulm: Clear anteriorly; no wheezing Abdomen:  Soft, Flat AB, skin exam normal, Normal bowel sounds.  No  tenderness  on exam . Without guarding and Without rebound, without hepatomegaly. Extremities:  Without edema. Neurologic:  Alert and  oriented x4;  grossly normal neurologically. Psych:  Alert and cooperative. Normal mood and affect.   GI:  Lab Results: Recent Labs    08/27/21 0251 08/27/21 0619  WBC 8.7 8.4  HGB 6.3* 6.6*  HCT 19.7* 21.7*  PLT 172 169   BMET Recent Labs    08/27/21 0251 08/27/21 0619  NA 140 142  K 4.0 4.7  CL 117* 119*  CO2 16* 18*  GLUCOSE 165* 127*  BUN 20 22  CREATININE 0.92 1.16  CALCIUM 7.2* 8.6*   LFT Recent Labs    08/27/21 0251  PROT 5.4*  ALBUMIN 3.2*  AST 39  ALT 45*  ALKPHOS 234*  BILITOT 0.5   PT/INR Recent Labs     08/27/21 0251  LABPROT >90.0*  INR >10.0*    Studies/Results: CT Angio Abd/Pel w/ and/or w/o  Result Date: 08/27/2021 CLINICAL DATA:  Acute, nonlocalized abdominal pain  blood in stool yesterday EXAM: CT ANGIOGRAPHY ABDOMEN TECHNIQUE: Multidetector CT imaging of the abdomen was performed using the standard protocol during bolus administration of intravenous contrast. Multiplanar reconstructed images and MIPs were obtained and reviewed to evaluate the vascular anatomy. CONTRAST:  111m OMNIPAQUE IOHEXOL 350 MG/ML SOLN COMPARISON:  MR of the abdomen from 8 days ago FINDINGS: VASCULAR Aorta: Diffuse atheromatous wall thickening of the aorta. Negative for aneurysm or dissection Celiac: Pancreatic mass with stable irregular and beaded appearance to the left gastric, splenic, and proximal hepatic arteries. No acute finding SMA: No acute finding or aneurysm. No evidence of angio dysplasia. No stenosis. Renals: Accessory right lower pole artery. No acute vascular finding. 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 Multiple varices in the upper abdomen including periesophageal and gastric. Splenomegaly. No acute venous occlusion is noted. Portal vein remains occluded beginning at the level of the infiltrative pancreas mass. Review of the MIP images confirms the above findings. NON-VASCULAR Lower chest: Extensive reticulonodular opacity at the bases. Staging chest CT was performed yesterday. Hepatobiliary: Low-density liver masses attributed to metastatic disease given change from 2021, recently evaluated by MRI. The largest is in the lower central liver on 11:24 at 26 mm. Pancreas: Pancreas mass with background pancreas atrophy, mass difficult to measure given diffuse infiltrative appearance in the retroperitoneum. Spleen: Enlarged in the setting  of portal hypertension. Adrenals/Urinary Tract: No hydronephrosis or adrenal mass. Stomach/Bowel: Moderate distension of the stomach by partially high-density semi-solid material. No active intraluminal bleeding is seen into stomach. There is submucosal low-density expansion of the ileum and proximal colon. Negative for bowel obstruction. Additional area of thickening at the rectum where there are internal hemorrhoids associated with the portal hypertension. Lymphatic: No discrete and measurable lymphadenopathy. Other: No ascites or pneumoperitoneum Musculoskeletal: No acute or aggressive finding IMPRESSION: 1. The stomach is moderately distended, possible intraluminal clot if no recent meal. No active intraluminal hemorrhage detected. 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 2. Ileal and proximal colonic wall edema which could be colitis or portal congestion. 3. Infiltrating pancreas mass, reference recent staging MRI of the abdomen Electronically Signed   By: JJorje GuildM.D.   On: 08/27/2021 04:32    Impression Malignant pancreatic adenocarcinoma  Currently on chemotherapy following with Dr. SAmmie Dalton Metastatic disease to the liver  -with chronic venous occlusion, portal hypertension and gastric varices - Hypercoagulability with INR greater than 10- repeat to make sure not a lab error AST 39 ALT 45 Alkphos 234 TBili 0.5 GFR >60  INR >10.0  Macrocytic anemia Baseline appears to be around 8.5-9, down to 6.6 on admission WBC 8.4 HGB 6.6 MCV 108.5 Platelets 169 Will get iron, ferritin and B12  Rectal bleeding  No active bleeding seen on recent CTA Possible portal congestion versus colitis Varices due to portal congestion, possible intraluminal clot versus meal  Plan Continue to monitor H&H with transfusion as needed to maintain hemoglobin greater than 7. Will get iron, ferritin, B12 and folate repeat INR for possible lab  error, repeat daily, can give vitamin K as needed When INR <2, can consider EGD for evaluation If destabilizing bleeding occurs, recommend CTA followed by IR consultation if positive. Continue protonix IV BID  Patient at some point may benefit from colonoscopy if EGD is unrevealing.  In the meantime we will get  iron, ferritin, B12 and folate due to macrocytic anemia and malnutrition. Continue supportive care.   [facility] will follow.     LOS: 0 days   Vladimir Crofts  PA-C 08/27/2021, 8:01 AM  Contact #  [phone]

## 2021-08-27 NOTE — Anesthesia Preprocedure Evaluation (Addendum)
Anesthesia Evaluation  Patient identified by MRN, date of birth, ID band Patient awake    Reviewed: Allergy & Precautions, NPO status , Patient's Chart, lab work & pertinent test results  Airway Mallampati: II  TM Distance: >3 FB Neck ROM: Full    Dental no notable dental hx. (+) Dental Advisory Given, Missing, Poor Dentition, Chipped,    Pulmonary neg pulmonary ROS,    Pulmonary exam normal breath sounds clear to auscultation       Cardiovascular hypertension, Pt. on medications Normal cardiovascular exam Rhythm:Regular Rate:Normal     Neuro/Psych negative neurological ROS  negative psych ROS   GI/Hepatic Neg liver ROS, GERD  Controlled and Medicated,Melena   malignant pancreatic adenocarcinoma with metastatic disease to the liver diagnosed 06/2020   Endo/Other  diabetes, Well Controlled, Type 2, Oral Hypoglycemic Agentsa1c 6.1  Renal/GU negative Renal ROS  negative genitourinary   Musculoskeletal negative musculoskeletal ROS (+)   Abdominal   Peds  Hematology  (+) Blood dyscrasia, anemia , Anemia 2/2 GIB  Hb 6.5 this AM 9:30 INR>10 on arrival  plt 169   Anesthesia Other Findings   Reproductive/Obstetrics negative OB ROS                           Anesthesia Physical Anesthesia Plan  ASA: 3  Anesthesia Plan: MAC   Post-op Pain Management:    Induction:   PONV Risk Score and Plan: 2 and Propofol infusion and TIVA  Airway Management Planned: Natural Airway and Simple Face Mask  Additional Equipment: None  Intra-op Plan:   Post-operative Plan:   Informed Consent: I have reviewed the patients History and Physical, chart, labs and discussed the procedure including the risks, benefits and alternatives for the proposed anesthesia with the patient or authorized representative who has indicated his/her understanding and acceptance.     Dental advisory given  Plan Discussed  with: CRNA  Anesthesia Plan Comments:        Anesthesia Quick Evaluation

## 2021-08-27 NOTE — Transfer of Care (Signed)
Immediate Anesthesia Transfer of Care Note  Patient: Leonidus Rowand  Procedure(s) Performed: ESOPHAGOGASTRODUODENOSCOPY (EGD) WITH PROPOFOL  Patient Location: PACU  Anesthesia Type:MAC  Level of Consciousness: sedated, patient cooperative and responds to stimulation  Airway & Oxygen Therapy: Patient Spontanous Breathing and Patient connected to nasal cannula oxygen  Post-op Assessment: Report given to RN and Post -op Vital signs reviewed and stable  Post vital signs: Reviewed and stable  Last Vitals:  Vitals Value Taken Time  BP    Temp    Pulse 75 08/27/21 1522  Resp 14 08/27/21 1522  SpO2 100 % 08/27/21 1522  Vitals shown include unvalidated device data.  Last Pain:  Vitals:   08/27/21 1318  TempSrc: Oral  PainSc: 0-No pain         Complications: No notable events documented.

## 2021-08-27 NOTE — ED Notes (Signed)
Called [facility] to advise patient has a bed ready--4E room 1314

## 2021-08-27 NOTE — H&P (Signed)
History and Physical    Aaron Johnston NID:[id number] DOB: [birth year] DOA: 08/27/2021  PCP: [name], [name], MD  Patient coming from: Home  Chief Complaint: blood in stool  HPI: Aaron Johnston is a 75 y.o. male with medical history significant of HTN, pancreatic cancer on chemo, HLD, GERD, chronic pain. Presenting with blood in his stools. His symptoms started yesterday. He noticed both dark stool and BRBPR. He had no pain. No N/V. His symptoms were present with every BM, and he had 5 of them yesterday. He denies NSAID and EtOH use. He denies any other aggravating or alleviating factors. He became concerned when the bleeding continued, so he came to the ED for help.   ED Course: His Hgb was noted to be 6.3. His INR was >10. He was given 46m IV of Vit K. [facility] consulted. [facility] was called for admission.   Review of Systems:  Denies CP, ab pain, dyspnea, N/V, fevers, palpitations. Reports dark, bloody stool. Review of systems is otherwise negative for all not mentioned in HPI.   PMHx Past Medical History:  Diagnosis Date   Diabetes mellitus without complication (HTexhoma    Family history of breast cancer    Family history of colon cancer    Family history of pancreatic cancer    Family history of stomach cancer     PSHx History reviewed. No pertinent surgical history.  SocHx  reports that he has never smoked. He has never used smokeless tobacco. He reports current alcohol use. He reports that he does not use drugs.  No Known Allergies  FamHx Family History  Problem Relation Age of Onset   Diabetes Mother    Pancreatic cancer Father        d. 584  Breast cancer Sister 640  Diabetes Sister    Diabetes Brother    Stomach cancer Paternal Aunt        2 pat aunts with stomach cancer   Colon cancer Paternal Uncle    Cancer Paternal Grandfather        NOS   Diabetes Sister    Diabetes Brother    Brain cancer Paternal Aunt    Cancer Paternal Aunt        eye   Diabetes Son      Prior to Admission medications   Medication Sig Start Date End Date Taking? Authorizing Provider  Accu-Chek FastClix Lancets MISC Apply topically. 08/30/20   [provider]  ACCU-CHEK GUIDE test strip  08/31/20   [provider]  Blood Glucose Monitoring Suppl (ACCU-CHEK GUIDE) w/Device KIT See admin instructions. 08/03/20   [provider]  diphenhydrAMINE (BENADRYL) 50 MG capsule Take by mouth.    [provider]  diphenoxylate-atropine (LOMOTIL) 2.5-0.025 MG tablet Take 1-2 tablets by mouth 4 (four) times daily as needed for diarrhea or loose stools. 08/06/21   TOwens Shark NP  famotidine (PEPCID) 10 MG tablet Take 10 mg by mouth daily as needed. Takes x 1 week after each tx for itching    [provider]  glucosamine-chondroitin 500-400 MG tablet Take 1 tablet by mouth daily.    [provider]  HYDROcodone-acetaminophen (NORCO/VICODIN) 5-325 MG tablet Take 1-2 tablets by mouth every 4 (four) hours as needed. 03/12/21   TOwens Shark NP  Insulin Lispro (HUMALOG KWIKPEN Sunshine) Inject into the skin 2 (two) times daily as needed. 10 units for cbg > 300 and 15 units for cbg > 350 Patient not taking: No sig reported  07/28/20   [name], [name], MD  latanoprost (XALATAN) 0.005 % ophthalmic solution Place 1 drop into both eyes at bedtime.    [provider]  lidocaine-prilocaine (EMLA) cream Apply 1 application topically as directed. Apply to port site 1 hour prior to stick and cover with plastic wrap 07/23/20   Ladell Pier, MD  lipase/protease/amylase (CREON) 36000 UNITS CPEP capsule Take 2 capsules (72,000 Units total) by mouth 3 (three) times daily with meals. May also take 1 capsule (36,000 Units total) as needed. 08/06/21   Owens Shark, NP  loperamide (IMODIUM) 2 MG capsule Take 2-4 mg by mouth as needed for diarrhea or loose stools.    [provider]  loratadine (CLARITIN) 10 MG tablet Take by mouth.    [provider]  magnesium oxide (MAG-OX) 400 MG tablet Take 1 tablet (400 mg total) by mouth 2 (two) times daily. 06/26/21   Owens Shark, NP  ondansetron (ZOFRAN) 8 MG tablet Take 1 tablet (8 mg total) by mouth every 8 (eight) hours as needed for nausea or vomiting. Patient not taking: No sig reported 07/23/20   Ladell Pier, MD  pantoprazole (PROTONIX) 40 MG tablet Take 40 mg by mouth 2 (two) times daily. 08/04/20   [provider]  pioglitazone (ACTOS) 45 MG tablet Take 45 mg by mouth daily. 12/28/13   [provider]  potassium chloride SA (KLOR-CON M20) 20 MEQ tablet Take 1 tablet (20 mEq total) by mouth daily. 08/07/21   Ladell Pier, MD  prochlorperazine (COMPAZINE) 10 MG tablet Take 1 tablet (10 mg total) by mouth every 6 (six) hours as needed for nausea. Patient not taking: No sig reported 07/23/20   Ladell Pier, MD  ramipril (ALTACE) 5 MG capsule Take 5 mg by mouth daily. Patient not taking: No sig reported 02/02/21   [provider]  simvastatin (ZOCOR) 80 MG tablet Take 0.5 tablets by mouth every evening. Patient not taking: No sig reported 02/08/19   [provider]  sitaGLIPtin (JANUVIA) 100 MG tablet Take 100 mg by mouth daily. Patient not taking: No sig reported 12/27/13   [provider]  tadalafil (CIALIS) 20 MG tablet Take by mouth. 06/11/21   [provider]    Physical Exam: Vitals:   08/27/21 0430 08/27/21 0500 08/27/21 0548 08/27/21 0552  BP: (!) 103/56 (!) 103/59 110/60   Pulse: 72 76 72   Resp: '16 15 18   ' Temp:   97.9 F (36.6 C)   TempSrc:      SpO2: 100% 100% 100%   Weight:    60.4 kg  Height:    '5\' 8"'  (1.727 m)    General: 75 y.o. frail appearing male resting in bed in NAD Eyes: PERRL, normal sclera ENMT: Nares patent w/o discharge, orophaynx clear, dentition normal, ears w/o discharge/lesions/ulcers Neck: Supple, trachea midline Cardiovascular: RRR, +S1, S2, no m/g/r, equal pulses  throughout Respiratory: CTABL, no w/r/r, normal WOB GI: BS+, NDNT, no masses noted, no organomegaly noted MSK: No e/c/c Skin: No rashes, bruises, ulcerations noted Neuro: A&O x 3, no focal deficits Psyc: Appropriate interaction and affect, calm/cooperative  Labs on Admission: I have personally reviewed following labs and imaging studies  CBC: Recent Labs  Lab 08/20/21 0925 08/27/21 0251 08/27/21 0619  WBC 16.2* 8.7 8.4  NEUTROABS 13.8* 7.1  --   HGB 7.7* 6.3* 6.6*  HCT 24.2* 19.7* 21.7*  MCV 102.1* 101.5* 108.5*  PLT 139* 172 169   Basic  Metabolic Panel: Recent Labs  Lab 08/20/21 0925 08/27/21 0251  NA 138 140  K 4.4 4.0  CL 110 117*  CO2 20* 16*  GLUCOSE 205* 165*  BUN 17 20  CREATININE 1.28* 0.92  CALCIUM 8.7* 7.2*  MG 1.8  --    GFR: Estimated Creatinine Clearance: 61.1 mL/min (by C-G formula based on SCr of 0.92 mg/dL). Liver Function Tests: Recent Labs  Lab 08/20/21 0925 08/27/21 0251  AST 33 39  ALT 48* 45*  ALKPHOS 282* 234*  BILITOT 0.6 0.5  PROT 6.5 5.4*  ALBUMIN 3.9 3.2*   No results for input(s): LIPASE, AMYLASE in the last 168 hours. No results for input(s): AMMONIA in the last 168 hours. Coagulation Profile: Recent Labs  Lab 08/27/21 0251  INR >10.0*   Cardiac Enzymes: No results for input(s): CKTOTAL, CKMB, CKMBINDEX, TROPONINI in the last 168 hours. BNP (last 3 results) No results for input(s): PROBNP in the last 8760 hours. HbA1C: No results for input(s): HGBA1C in the last 72 hours. CBG: No results for input(s): GLUCAP in the last 168 hours. Lipid Profile: No results for input(s): CHOL, HDL, LDLCALC, TRIG, CHOLHDL, LDLDIRECT in the last 72 hours. Thyroid Function Tests: No results for input(s): TSH, T4TOTAL, FREET4, T3FREE, THYROIDAB in the last 72 hours. Anemia Panel: No results for input(s): VITAMINB12, FOLATE, FERRITIN, TIBC, IRON, RETICCTPCT in the last 72 hours. Urine analysis:    Component Value Date/Time   COLORURINE  YELLOW 10/22/2020 1529   APPEARANCEUR CLEAR 10/22/2020 1529   LABSPEC 1.014 10/22/2020 1529   PHURINE 5.0 10/22/2020 1529   GLUCOSEU 50 (A) 10/22/2020 1529   HGBUR SMALL (A) 10/22/2020 1529   BILIRUBINUR NEGATIVE 10/22/2020 1529   KETONESUR NEGATIVE 10/22/2020 1529   PROTEINUR NEGATIVE 10/22/2020 1529   NITRITE NEGATIVE 10/22/2020 1529   LEUKOCYTESUR NEGATIVE 10/22/2020 1529    Radiological Exams on Admission: CT Angio Abd/Pel w/ and/or w/o  Result Date: 08/27/2021 CLINICAL DATA:  Acute, nonlocalized abdominal pain blood in stool yesterday EXAM: CT ANGIOGRAPHY ABDOMEN TECHNIQUE: Multidetector CT imaging of the abdomen was performed using the standard protocol during bolus administration of intravenous contrast. Multiplanar reconstructed images and MIPs were obtained and reviewed to evaluate the vascular anatomy. CONTRAST:  163m OMNIPAQUE IOHEXOL 350 MG/ML SOLN COMPARISON:  MR of the abdomen from 8 days ago FINDINGS: VASCULAR Aorta: Diffuse atheromatous wall thickening of the aorta. Negative for aneurysm or dissection Celiac: Pancreatic mass with stable irregular and beaded appearance to the left gastric, splenic, and proximal hepatic arteries. No acute finding SMA: No acute finding or aneurysm. No evidence of angio dysplasia. No stenosis. Renals: Accessory right lower pole artery. No acute vascular finding. 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 Multiple varices in the upper abdomen including periesophageal and gastric. Splenomegaly. No acute venous occlusion is noted. Portal vein remains occluded beginning at the level of the infiltrative pancreas mass. Review of the MIP images confirms the above findings. NON-VASCULAR Lower chest: Extensive reticulonodular opacity at the bases. Staging chest CT was performed yesterday.  Hepatobiliary: Low-density liver masses attributed to metastatic disease given change from 2021, recently evaluated by MRI. The largest is in the lower central liver on 11:24 at 26 mm. Pancreas: Pancreas mass with background pancreas atrophy, mass difficult to measure given diffuse infiltrative appearance in the retroperitoneum. Spleen: Enlarged in the setting of portal hypertension. Adrenals/Urinary Tract: No hydronephrosis  or adrenal mass. Stomach/Bowel: Moderate distension of the stomach by partially high-density semi-solid material. No active intraluminal bleeding is seen into stomach. There is submucosal low-density expansion of the ileum and proximal colon. Negative for bowel obstruction. Additional area of thickening at the rectum where there are internal hemorrhoids associated with the portal hypertension. Lymphatic: No discrete and measurable lymphadenopathy. Other: No ascites or pneumoperitoneum Musculoskeletal: No acute or aggressive finding IMPRESSION: 1. The stomach is moderately distended, possible intraluminal clot if no recent meal. No active intraluminal hemorrhage detected. 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 2. Ileal and proximal colonic wall edema which could be colitis or portal congestion. 3. Infiltrating pancreas mass, reference recent staging MRI of the abdomen Electronically Signed   By: Jorje Guild M.D.   On: 08/27/2021 04:32    EKG: None obtained in ED  Assessment/Plan GIB Supratherapeutic INR     - admitted to inpt, progressive     - given 36m IV Vit K in ED; repeat INR at 1000hrs and in AM; holding FFP for right now as his vitals are stable and rpt Hgb is holding     - ordered 2 units pRBCs; check q6h H&H starting 1000hrs     - he's not on coumadin; none of his medicines listed should interfere with his INR; is this from his liver mets? Chemo?     - [facility] onboard, appreciate assistance; will hold him  NPO until they eval      - continue PPI, fluids    Pancreatic CA w/ mets on chemo      - onco notified of patient admission  DM2     - right now he's NPO until GI eval     - SSI, glucose checks, A1c  HTN     - BP is soft, will hold home regimen for right now  Chronic pain     - continue home regimen as tolerated  Severe protein-calorie malnutrition     - dietary consult  Glaucoma     - resume home regimen  HLD     - hold statin for now d/t liver dysfxn  GERD     - continue PPI  DVT prophylaxis: SCDs  Code Status: FULL  Family Communication: w/ wife at bedside  Consults called: [facility], onco   Status is: Inpatient  Remains inpatient appropriate because:Inpatient level of care appropriate due to severity of illness  Dispo: The patient is from: Home              Anticipated d/c is to: Home              Patient currently is not medically stable to d/c.   Difficult to place patient No  Time spent coordinating admission: 75 minutes  [facility]  If 7PM-7AM, please contact night-coverage [email or web address]  08/27/2021, 7:40 AM

## 2021-08-27 NOTE — Op Note (Signed)
[facility] Patient Name: Aaron Johnston Procedure Date: 08/27/2021 MRN: [id number] Attending MD: Clarene Essex , MD Date of Birth: [birth year] CSN: [id number] Age: 75 Admit Type: Inpatient Procedure:                Upper GI endoscopy Indications:              anemia secondary to subacute blood loss as well as                            probable chemotherapy and chronic disease but Heme                            positive stool Providers:                Clarene Essex, MD, Ervin Knack, Rhoderick Moody Fucs RN, RN,                            Benetta Spar, Technician Referring MD:              Medicines:                Propofol total dose 009 mg IV Complications:            No immediate complications. Estimated Blood Loss:     Estimated blood loss: none. Procedure:                Pre-Anesthesia Assessment:                           - Prior to the procedure, a History and Physical                            was performed, and patient medications and                            allergies were reviewed. The patient's tolerance of                            previous anesthesia was also reviewed. The risks                            and benefits of the procedure and the sedation                            options and risks were discussed with the patient.                            All questions were answered, and informed consent                            was obtained. Prior Anticoagulants: The patient has                            taken no previous anticoagulant or antiplatelet  agents. ASA Grade Assessment: III - A patient with                            severe systemic disease. After reviewing the risks                            and benefits, the patient was deemed in                            satisfactory condition to undergo the procedure.                           After obtaining informed consent, the endoscope was                            passed  under direct vision. Throughout the                            procedure, the patient's blood pressure, pulse, and                            oxygen saturations were monitored continuously. The                            [id number]-[id number] ([id number]) Olympus endoscope was introduced                            through the mouth, and advanced to the third part                            of duodenum. The upper GI endoscopy was                            accomplished without difficulty. The patient                            tolerated the procedure well. Scope In: Scope Out: Findings:      A small hiatal hernia was present.      Grade I varices were found in the lower third of the esophagus. They       were small in size. And they did not have any stigmata of bleeding and       they flattened out with air insufflation      ?Varices with no bleeding were found in the cardia. There were no       stigmata of recent bleeding. They were small in largest diameter. And       they did flatten out somewhat with air insufflation as well      The ampulla, duodenal bulb, first portion of the duodenum, second       portion of the duodenum, major papilla, area of the papilla and third       portion of the duodenum were normal.      The exam was otherwise without abnormality without any blood being seen. Impression:               -  Small hiatal hernia.                           - Grade I esophageal varices.                           - Gastric varices, without bleeding.                           - Normal ampulla, duodenal bulb, first portion of                            the duodenum, second portion of the duodenum, major                            papilla, area of the papilla and third portion of                            the duodenum.                           - The examination was otherwise normal.                           - No specimens collected. Moderate Sedation:      Not Applicable - Patient had care per  Anesthesia. Recommendation:           - Soft diet today. Would only proceed with further                            GI work-up if oncology thought it was necessary and                            would probably consider a capsule endoscopy next                           - Continue present medications. Would use 1 pump                            inhibitor a day and consider low-dose Inderal to                            decrease portal pressures if patient could tolerate                            that and begin at 20 mg twice a day                           - Return to GI clinic PRN.                           - Telephone GI clinic if symptomatic PRN. I                            discussed  his case with IR and unfortunately based                            on his anatomy they do not feel he is either a TIPS                            candidate nor B RTO in the future but could revisit                            that if signs of life-threatening bleeding Procedure Code(s):        --- Professional ---                           [phone], Esophagogastroduodenoscopy, flexible,                            transoral; diagnostic, including collection of                            specimen(s) by brushing or washing, when performed                            (separate procedure) Diagnosis Code(s):        --- Professional ---                           K44.9, Diaphragmatic hernia without obstruction or                            gangrene                           I85.00, Esophageal varices without bleeding                           I86.4, Gastric varices                           D50.0, Iron deficiency anemia secondary to blood                            loss (chronic)                           R19.5, Other fecal abnormalities CPT copyright 2019 [employer]. All rights reserved. The codes documented in this report are preliminary and upon coder review may  be revised to meet current  compliance requirements. Clarene Essex, MD 08/27/2021 3:27:09 PM This report has been signed electronically. Number of Addenda: 0

## 2021-08-28 ENCOUNTER — Inpatient Hospital Stay: Payer: Medicare Other

## 2021-08-28 ENCOUNTER — Encounter (HOSPITAL_COMMUNITY): Payer: Self-pay | Admitting: Gastroenterology

## 2021-08-28 DIAGNOSIS — D696 Thrombocytopenia, unspecified: Secondary | ICD-10-CM

## 2021-08-28 DIAGNOSIS — E119 Type 2 diabetes mellitus without complications: Secondary | ICD-10-CM | POA: Diagnosis not present

## 2021-08-28 DIAGNOSIS — C259 Malignant neoplasm of pancreas, unspecified: Secondary | ICD-10-CM | POA: Diagnosis not present

## 2021-08-28 DIAGNOSIS — I10 Essential (primary) hypertension: Secondary | ICD-10-CM | POA: Diagnosis not present

## 2021-08-28 DIAGNOSIS — I8500 Esophageal varices without bleeding: Secondary | ICD-10-CM

## 2021-08-28 DIAGNOSIS — G893 Neoplasm related pain (acute) (chronic): Secondary | ICD-10-CM

## 2021-08-28 DIAGNOSIS — D709 Neutropenia, unspecified: Secondary | ICD-10-CM

## 2021-08-28 DIAGNOSIS — K922 Gastrointestinal hemorrhage, unspecified: Secondary | ICD-10-CM | POA: Diagnosis not present

## 2021-08-28 LAB — COMPREHENSIVE METABOLIC PANEL
ALT: 45 U/L — ABNORMAL HIGH (ref 0–44)
AST: 33 U/L (ref 15–41)
Albumin: 3 g/dL — ABNORMAL LOW (ref 3.5–5.0)
Alkaline Phosphatase: 224 U/L — ABNORMAL HIGH (ref 38–126)
Anion gap: 6 (ref 5–15)
BUN: 18 mg/dL (ref 8–23)
CO2: 18 mmol/L — ABNORMAL LOW (ref 22–32)
Calcium: 8.2 mg/dL — ABNORMAL LOW (ref 8.9–10.3)
Chloride: 116 mmol/L — ABNORMAL HIGH (ref 98–111)
Creatinine, Ser: 1.16 mg/dL (ref 0.61–1.24)
GFR, Estimated: >60 mL/min (ref >60)
Glucose, Bld: 100 mg/dL — ABNORMAL HIGH (ref 70–99)
Potassium: 3.8 mmol/L (ref 3.5–5.1)
Sodium: 140 mmol/L (ref 135–145)
Total Bilirubin: 0.8 mg/dL (ref 0.3–1.2)
Total Protein: 5.7 g/dL — ABNORMAL LOW (ref 6.5–8.1)

## 2021-08-28 LAB — BPAM RBC
Blood Product Expiration Date: 202211052359
Blood Product Expiration Date: 202211052359
ISSUE DATE / TIME: 202210130949
ISSUE DATE / TIME: 202210131245
Unit Type and Rh: 6200
Unit Type and Rh: 6200

## 2021-08-28 LAB — TYPE AND SCREEN
ABO/RH(D): A POS
Antibody Screen: NEGATIVE
Unit division: 0
Unit division: 0

## 2021-08-28 LAB — CBC
HCT: 23 % — ABNORMAL LOW (ref 39.0–52.0)
Hemoglobin: 7.3 g/dL — ABNORMAL LOW (ref 13.0–17.0)
MCH: 32.3 pg (ref 26.0–34.0)
MCHC: 31.7 g/dL (ref 30.0–36.0)
MCV: 101.8 fL — ABNORMAL HIGH (ref 80.0–100.0)
Platelets: 132 10*3/uL — ABNORMAL LOW (ref 150–400)
RBC: 2.26 MIL/uL — ABNORMAL LOW (ref 4.22–5.81)
RDW: 18.9 % — ABNORMAL HIGH (ref 11.5–15.5)
WBC: 8.4 10*3/uL (ref 4.0–10.5)
nRBC: 0 % (ref 0.0–0.2)

## 2021-08-28 LAB — GLUCOSE, CAPILLARY
Glucose-Capillary: 98 mg/dL (ref 70–99)
Glucose-Capillary: 177 mg/dL — ABNORMAL HIGH (ref 70–99)
Glucose-Capillary: 89 mg/dL (ref 70–99)
Glucose-Capillary: 144 mg/dL — ABNORMAL HIGH (ref 70–99)

## 2021-08-28 MED ORDER — LATANOPROST 0.005 % OP SOLN
1.0000 [drp] | Freq: Every day | OPHTHALMIC | Status: DC
  Administered 2021-08-28 – 2021-08-31 (×4): 1 [drp] via OPHTHALMIC
  Filled 2021-08-28: qty 2.5

## 2021-08-28 MED ORDER — PROSOURCE PLUS PO LIQD
30.0000 mL | Freq: Two times a day (BID) | ORAL | Status: DC
  Administered 2021-08-28 – 2021-09-01 (×8): 30 mL via ORAL
  Filled 2021-08-28 (×8): qty 30

## 2021-08-28 MED ORDER — HYDROCODONE-ACETAMINOPHEN 5-325 MG PO TABS: 1.0000 | ORAL_TABLET | ORAL | Status: DC | PRN

## 2021-08-28 MED ORDER — PEG 3350-KCL-NA BICARB-NACL 420 G PO SOLR
4000.0000 mL | Freq: Once | ORAL | Status: AC
  Administered 2021-08-28: 4000 mL via ORAL

## 2021-08-28 MED ORDER — BOOST / RESOURCE BREEZE PO LIQD CUSTOM
1.0000 | Freq: Two times a day (BID) | ORAL | Status: DC
  Administered 2021-08-28 – 2021-09-01 (×8): 1 via ORAL

## 2021-08-28 MED ORDER — PROPRANOLOL HCL 20 MG PO TABS
20.0000 mg | ORAL_TABLET | Freq: Two times a day (BID) | ORAL | Status: DC
Start: 2021-08-28 — End: 2021-09-01
  Administered 2021-08-28 – 2021-08-30 (×2): 20 mg via ORAL
  Filled 2021-08-28 (×9): qty 1

## 2021-08-28 MED ORDER — LOPERAMIDE HCL 2 MG PO CAPS: 2.0000 mg | ORAL_CAPSULE | ORAL | Status: DC | PRN

## 2021-08-28 MED ORDER — PANCRELIPASE (LIP-PROT-AMYL) 12000-38000 UNITS PO CPEP: 36000.0000 [IU] | ORAL_CAPSULE | Freq: Three times a day (TID) | ORAL | Status: DC

## 2021-08-28 MED ORDER — ADULT MULTIVITAMIN W/MINERALS CH
1.0000 | ORAL_TABLET | Freq: Every day | ORAL | Status: DC
  Administered 2021-08-28 – 2021-09-01 (×5): 1 via ORAL
  Filled 2021-08-28 (×6): qty 1

## 2021-08-28 MED ORDER — PANCRELIPASE (LIP-PROT-AMYL) 36000-114000 UNITS PO CPEP
72000.0000 [IU] | ORAL_CAPSULE | Freq: Three times a day (TID) | ORAL | Status: DC
  Administered 2021-08-28 – 2021-09-01 (×11): 72000 [IU] via ORAL
  Filled 2021-08-28 (×16): qty 2

## 2021-08-28 NOTE — Progress Notes (Signed)
Initial Nutrition Assessment  DOCUMENTATION CODES:   Severe malnutrition in context of chronic illness  INTERVENTION:  - will order Boost Breeze BID, each supplement provides 250 kcal and 9 grams of protein. - will order 30 ml Prosource Plus BID, each supplement provides 100 kcal and 15 grams protein.  - will order 1 tablet multivitamin with minerals/day.    NUTRITION DIAGNOSIS:   Severe Malnutrition related to chronic illness, cancer and cancer related treatments as evidenced by severe fat depletion, severe muscle depletion.  GOAL:   Patient will meet greater than or equal to 90% of their needs  MONITOR:   PO intake, Supplement acceptance, Labs, Weight trends  REASON FOR ASSESSMENT:   Consult Assessment of nutrition requirement/status  ASSESSMENT:   75 y.o. 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 He presented to the ED on 10/13 due to 3-4 episodes in 24 hours of dark stool with a small amount of blood.  Patient is laying bed with wife at bedside. He reports having a good breakfast on Soft diet before being downgraded to CLD; plan for NPO starting at midnight for colonoscopy tomorrow.  For several months he has experienced bowel urgency with all solid foods. He has creon for home TID with meals and with all snacks, but he does not take it as prescribed as he does not feel it is helpful. Encouraged patient to talk with medical MD or GI here about changing dosage or other medication options.  He states that with all solid foods he has a mainly liquid BM within 20 minutes of eating. This does not occur with liquids or things such as a milkshake, that he is aware of.  He has Boost at home and consumes them sporadically.   Patient reports UBW of 128 lb which he had been stable at for a long time and that he was happy because more recently he had gained up to 131  lb. He feels that weight is now trending down.  Weight yesterday was 133 lb. Weight has been mainly stable since 05/01/21.   He denies any nausea or abdominal discomfort with PO intakes. He has not had any dizziness, lightheadedness, or weakness with ambulating. He is very active and states that this sometimes leads to severe abdominal pain which he associates with hiatal hernia; he is able to push it back in and this mainly relieves the pain.     Labs reviewed; CBGs: 98 and 177 mg/dl, Cl; 116 mmol/l, Ca: 8.2 mg/dl, Alk Phos elevated.  Medications reviewed; sliding scale novolog, 72000 units creon TID, PRN imodium, 40 mg oral protonix/day.    NUTRITION - FOCUSED PHYSICAL EXAM:  Flowsheet Row Most Recent Value  Orbital Region Moderate depletion  Upper Arm Region Severe depletion  Thoracic and Lumbar Region Severe depletion  Buccal Region Moderate depletion  Temple Region Moderate depletion  Clavicle Bone Region Severe depletion  Clavicle and Acromion Bone Region Severe depletion  Scapular Bone Region Unable to assess  Dorsal Hand Severe depletion  Patellar Region Unable to assess  Anterior Thigh Region Unable to assess  Posterior Calf Region Unable to assess  Edema (RD Assessment) Unable to assess  Hair Reviewed  Eyes Reviewed  Mouth Reviewed  Skin Reviewed  Nails Reviewed       Diet Order:   Diet Order             Diet NPO time specified  Diet  effective midnight           Diet clear liquid Room service appropriate? Yes; Fluid consistency: Thin  Diet effective now                   EDUCATION NEEDS:   No education needs have been identified at this time  Skin:  Skin Assessment: Reviewed RN Assessment  Last BM:  10/13  Height:   Ht Readings from Last 1 Encounters:  08/27/21 '5\' 8"'  (1.727 m)    Weight:   Wt Readings from Last 1 Encounters:  08/27/21 60.4 kg      Estimated Nutritional Needs:  Kcal:  2115-2350 kcal Protein:  110-125 grams Fluid:   >/= 2.5 L/day     Jarome Matin, MS, RD, LDN, CNSC Inpatient Clinical Dietitian RD pager # available in AMION  After hours/weekend pager # available in [facility]

## 2021-08-28 NOTE — H&P (View-Only) (Signed)
[facility] Gastroenterology Progress Note  Aaron Johnston 75 y.o. March 10, 1946  CC: Anemia and rectal bleeding  Subjective: Patient had endoscopy yesterday with Dr. Watt Climes.  This was unrevealing for source of bleeding. Patient had 2 episodes of dark red blood with diarrhea last night.  Denies AB pain, denies nausea, vomiting, fever, chills.  Patient ate breakfast this AM without issues.   ROS : Review of Systems  Constitutional:  Negative for chills and fever.  Respiratory:  Negative for cough and shortness of breath.   Cardiovascular:  Negative for chest pain.  Gastrointestinal:  Positive for blood in stool and diarrhea. Negative for abdominal pain, constipation, heartburn, melena, nausea and vomiting.  Musculoskeletal:  Negative for falls.  Neurological:  Negative for dizziness.  Psychiatric/Behavioral:  Negative for memory loss.    Objective: Vital signs in last 24 hours: Vitals:   08/27/21 2146 08/28/21 0543  BP: (!) 123/54 (!) 106/56  Pulse: 80 73  Resp: 16 16  Temp: 98.6 F (37 C) 99.2 F (37.3 C)  SpO2: 100% 100%    Physical Exam: General:   Alert, cachectic male, in NAD Heart:  Regular rate and rhythm; no murmurs, left porta-cath in place Pulm: Clear anteriorly; no wheezing Abdomen:  Soft, Flat AB, skin exam normal, Normal bowel sounds.  No  tenderness  on exam . Without guarding and Without rebound, without hepatomegaly. Extremities:  Without edema. Neurologic:  Alert and  oriented x4;  grossly normal neurologically. Psych:  Alert and cooperative. Normal mood and affect.  Lab Results: Recent Labs    08/27/21 0926 08/28/21 0407  NA 139 140  K 4.4 3.8  CL 115* 116*  CO2 18* 18*  GLUCOSE 95 100*  BUN 22 18  CREATININE 1.10 1.16  CALCIUM 8.4* 8.2*   Recent Labs    08/27/21 0926 08/28/21 0407  AST 39 33  ALT 50* 45*  ALKPHOS 236* 224*  BILITOT 0.6 0.8  PROT 5.7* 5.7*  ALBUMIN 3.3* 3.0*   Recent Labs    08/27/21 0251 08/27/21 0619 08/27/21 0926  08/27/21 2304 08/28/21 0407  WBC 8.7 8.4  --   --  8.4  NEUTROABS 7.1  --   --   --   --   HGB 6.3* 6.6*   < > 8.5* 7.3*  HCT 19.7* 21.7*   < > 26.5* 23.0*  MCV 101.5* 108.5*  --   --  101.8*  PLT 172 169  --   --  132*   < > = values in this interval not displayed.   Recent Labs    08/27/21 0251 08/27/21 0926  LABPROT >90.0* 15.3*  INR >10.0* 1.2    Lab Results: Results for orders placed or performed during the hospital encounter of 08/27/21 (from the past 48 hour(s))  Resp Panel by RT-PCR (Flu A&B, Covid) Nasopharyngeal Swab     Status: None   Collection Time: 08/27/21  2:40 AM   Specimen: Nasopharyngeal Swab; Nasopharyngeal(NP) swabs in vial transport medium  Result Value Ref Range   SARS Coronavirus 2 by RT PCR NEGATIVE NEGATIVE    Comment: (NOTE) SARS-CoV-2 target nucleic acids are NOT DETECTED.  The SARS-CoV-2 RNA is generally detectable in upper respiratory specimens during the acute phase of infection. The lowest concentration of SARS-CoV-2 viral copies this assay can detect is 138 copies/mL. A negative result does not preclude SARS-Cov-2 infection and should not be used as the sole basis for treatment or other patient management decisions. A negative result may occur with  improper specimen collection/handling, submission of specimen other than nasopharyngeal swab, presence of viral mutation(s) within the areas targeted by this assay, and inadequate number of viral copies(<138 copies/mL). A negative result must be combined with clinical observations, patient history, and epidemiological information. The expected result is Negative.  Fact Sheet for Patients:  EntrepreneurPulse.com.au  Fact Sheet for Healthcare Providers:  IncredibleEmployment.be  This test is no t yet approved or cleared by the [place] FDA and  has been authorized for detection and/or diagnosis of SARS-CoV-2 by FDA under an Emergency Use Authorization  (EUA). This EUA will remain  in effect (meaning this test can be used) for the duration of the COVID-19 declaration under Section 564(b)(1) of the Act, 21 U.S.C.section 360bbb-3(b)(1), unless the authorization is terminated  or revoked sooner.       Influenza A by PCR NEGATIVE NEGATIVE   Influenza B by PCR NEGATIVE NEGATIVE    Comment: (NOTE) The Xpert Xpress SARS-CoV-2/FLU/RSV plus assay is intended as an aid in the diagnosis of influenza from Nasopharyngeal swab specimens and should not be used as a sole basis for treatment. Nasal washings and aspirates are unacceptable for Xpert Xpress SARS-CoV-2/FLU/RSV testing.  Fact Sheet for Patients: EntrepreneurPulse.com.au  Fact Sheet for Healthcare Providers: IncredibleEmployment.be  This test is not yet approved or cleared by the [place] FDA and has been authorized for detection and/or diagnosis of SARS-CoV-2 by FDA under an Emergency Use Authorization (EUA). This EUA will remain in effect (meaning this test can be used) for the duration of the COVID-19 declaration under Section 564(b)(1) of the Act, 21 U.S.C. section 360bbb-3(b)(1), unless the authorization is terminated or revoked.  Performed at [facility], [address] [place], [place] [address]   Occult blood card to lab, stool     Status: Abnormal   Collection Time: 08/27/21  2:47 AM  Result Value Ref Range   Fecal Occult Bld POSITIVE (A) NEGATIVE    Comment: Performed at [facility], [place], [place] [address]  CBC with Differential/Platelet     Status: Abnormal   Collection Time: 08/27/21  2:51 AM  Result Value Ref Range   WBC 8.7 4.0 - 10.5 K/uL   RBC 1.94 (L) 4.22 - 5.81 MIL/uL   Hemoglobin 6.3 (LL) 13.0 - 17.0 g/dL    Comment: REPEATED TO VERIFY THIS CRITICAL RESULT HAS VERIFIED AND BEEN CALLED TO L. VENEGAS RN BY CYNTHIA HARDIN ON 10 13 2022 AT 0301, AND HAS BEEN  READ BACK.     HCT 19.7 (L) 39.0 - 52.0 %   MCV 101.5 (H) 80.0 - 100.0 fL   MCH 32.5 26.0 - 34.0 pg   MCHC 32.0 30.0 - 36.0 g/dL   RDW 16.7 (H) 11.5 - 15.5 %   Platelets 172 150 - 400 K/uL   nRBC 0.0 0.0 - 0.2 %   Neutrophils Relative % 81 %   Neutro Abs 7.1 1.7 - 7.7 K/uL   Lymphocytes Relative 8 %   Lymphs Abs 0.7 0.7 - 4.0 K/uL   Monocytes Relative 6 %   Monocytes Absolute 0.5 0.1 - 1.0 K/uL   Eosinophils Relative 3 %   Eosinophils Absolute 0.3 0.0 - 0.5 K/uL   Basophils Relative 1 %   Basophils Absolute 0.1 0.0 - 0.1 K/uL   Immature Granulocytes 1 %   Abs Immature Granulocytes 0.09 (H) 0.00 - 0.07 K/uL    Comment: Performed at [facility], [address] [place], [place] [address]  Comprehensive metabolic panel     Status: Abnormal   Collection Time: 08/27/21  2:51 AM  Result Value Ref Range   Sodium 140 135 - 145 mmol/L   Potassium 4.0 3.5 - 5.1 mmol/L   Chloride 117 (H) 98 - 111 mmol/L   CO2 16 (L) 22 - 32 mmol/L   Glucose, Bld 165 (H) 70 - 99 mg/dL    Comment: Glucose reference range applies only to samples taken after fasting for at least 8 hours.   BUN 20 8 - 23 mg/dL   Creatinine, Ser 0.92 0.61 - 1.24 mg/dL   Calcium 7.2 (L) 8.9 - 10.3 mg/dL   Total Protein 5.4 (L) 6.5 - 8.1 g/dL   Albumin 3.2 (L) 3.5 - 5.0 g/dL   AST 39 15 - 41 U/L   ALT 45 (H) 0 - 44 U/L   Alkaline Phosphatase 234 (H) 38 - 126 U/L   Total Bilirubin 0.5 0.3 - 1.2 mg/dL   GFR, Estimated >60 >60 mL/min    Comment: (NOTE) Calculated using the CKD-EPI Creatinine Equation (2021)    Anion gap 7 5 - 15    Comment: Performed at [facility], [address], [place], [place] [address]  Protime-INR     Status: Abnormal   Collection Time: 08/27/21  2:51 AM  Result Value Ref Range   Prothrombin Time >90.0 (H) 11.4 - 15.2 seconds   INR >10.0 (HH) 0.8 - 1.2    Comment: CRITICAL RESULT CALLED TO, READ BACK BY AND VERIFIED WITH: Burgess Amor RN AT 0320 08/27/21  CH (NOTE) INR goal varies based on device and disease states. Performed at [facility], [address] [place], [place] [address]   CBC     Status: Abnormal   Collection Time: 08/27/21  6:19 AM  Result Value Ref Range   WBC 8.4 4.0 - 10.5 K/uL   RBC 2.00 (L) 4.22 - 5.81 MIL/uL   Hemoglobin 6.6 (LL) 13.0 - 17.0 g/dL    Comment: REPEATED TO VERIFY THIS CRITICAL RESULT HAS VERIFIED AND BEEN CALLED TO ANNNOH,P. RN BY NICOLE MCCOY ON 10 13 2022 AT 0703, AND HAS BEEN READ BACK. CRITICAL RESULT VERIFIED    HCT 21.7 (L) 39.0 - 52.0 %   MCV 108.5 (H) 80.0 - 100.0 fL   MCH 33.0 26.0 - 34.0 pg   MCHC 30.4 30.0 - 36.0 g/dL   RDW 17.1 (H) 11.5 - 15.5 %   Platelets 169 150 - 400 K/uL   nRBC 0.0 0.0 - 0.2 %    Comment: Performed at [facility], [place] [address], [place], [place] [address]  Basic metabolic panel     Status: Abnormal   Collection Time: 08/27/21  6:19 AM  Result Value Ref Range   Sodium 142 135 - 145 mmol/L   Potassium 4.7 3.5 - 5.1 mmol/L   Chloride 119 (H) 98 - 111 mmol/L   CO2 18 (L) 22 - 32 mmol/L   Glucose, Bld 127 (H) 70 - 99 mg/dL    Comment: Glucose reference range applies only to samples taken after fasting for at least 8 hours.   BUN 22 8 - 23 mg/dL   Creatinine, Ser 1.16 0.61 - 1.24 mg/dL   Calcium 8.6 (L) 8.9 - 10.3 mg/dL   GFR, Estimated >60 >60 mL/min    Comment: (NOTE) Calculated using the CKD-EPI Creatinine Equation (2021)    Anion gap 5 5 - 15    Comment: Performed at [facility]  [place] [address] [place], [place] [address]  Type and screen [place]     Status: None   Collection Time: 08/27/21  6:22 AM  Result Value Ref Range   ABO/RH(D) A POS    Antibody Screen NEG    Sample Expiration 08/30/2021,2359    Unit Number [id number]    Blood Component Type RED CELLS,LR    Unit division 00    Status of Unit ISSUED,FINAL    Transfusion Status OK TO TRANSFUSE    Crossmatch  Result Compatible    Unit Number [id number]    Blood Component Type RED CELLS,LR    Unit division 00    Status of Unit ISSUED,FINAL    Transfusion Status OK TO TRANSFUSE    Crossmatch Result      Compatible Performed at [facility], [place] [address], [place], [place] [address]   Hemoglobin A1c     Status: Abnormal   Collection Time: 08/27/21  6:22 AM  Result Value Ref Range   Hgb A1c MFr Bld 6.1 (H) 4.8 - 5.6 %    Comment: (NOTE) Pre diabetes:          5.7%-6.4%  Diabetes:              >6.4%  Glycemic control for   <7.0% adults with diabetes    Mean Plasma Glucose 128.37 mg/dL    Comment: Performed at [facility] [address], [place], [place] [address]  Folate     Status: None   Collection Time: 08/27/21  6:22 AM  Result Value Ref Range   Folate 14.0 >5.9 ng/mL    Comment: Performed at [facility], [place] [address] [place], [place] [address]  Reticulocytes     Status: Abnormal   Collection Time: 08/27/21  6:22 AM  Result Value Ref Range   Retic Ct Pct 1.7 0.4 - 3.1 %   RBC. 2.03 (L) 4.22 - 5.81 MIL/uL   Retic Count, Absolute 33.7 19.0 - 186.0 K/uL   Immature Retic Fract 14.3 2.3 - 15.9 %    Comment: Performed at [facility], [place] [address] [place], [place] [address]  Prepare RBC (crossmatch)     Status: None   Collection Time: 08/27/21  8:09 AM  Result Value Ref Range   Order Confirmation      ORDER PROCESSED BY BLOOD BANK Performed at [facility], [place] [address], [place], [place] [address]   Comprehensive metabolic panel     Status: Abnormal   Collection Time: 08/27/21  9:26 AM  Result Value Ref Range   Sodium 139 135 - 145 mmol/L   Potassium 4.4 3.5 - 5.1 mmol/L   Chloride 115 (H) 98 - 111 mmol/L   CO2 18 (L) 22 - 32 mmol/L   Glucose, Bld 95 70 - 99 mg/dL    Comment: Glucose reference range applies only to samples taken after fasting for at least 8 hours.   BUN 22 8 - 23  mg/dL   Creatinine, Ser 1.10 0.61 - 1.24 mg/dL   Calcium 8.4 (L) 8.9 - 10.3 mg/dL   Total Protein 5.7 (L) 6.5 - 8.1 g/dL   Albumin 3.3 (L) 3.5 - 5.0 g/dL   AST 39 15 - 41 U/L   ALT 50 (H) 0 - 44 U/L   Alkaline Phosphatase 236 (H) 38 - 126 U/L   Total Bilirubin 0.6 0.3 - 1.2 mg/dL   GFR,  Estimated >60 >60 mL/min    Comment: (NOTE) Calculated using the CKD-EPI Creatinine Equation (2021)    Anion gap 6 5 - 15    Comment: Performed at [facility], [place] [address] [place], [place] [address]  Hemoglobin and hematocrit, blood     Status: Abnormal   Collection Time: 08/27/21  9:26 AM  Result Value Ref Range   Hemoglobin 6.5 (LL) 13.0 - 17.0 g/dL    Comment: CRITICAL VALUE NOTED.  VALUE IS CONSISTENT WITH PREVIOUSLY REPORTED AND CALLED VALUE. REPEATED TO VERIFY    HCT 20.8 (L) 39.0 - 52.0 %    Comment: Performed at [facility], [place] [address] [place], [place] [address]  Protime-INR     Status: Abnormal   Collection Time: 08/27/21  9:26 AM  Result Value Ref Range   Prothrombin Time 15.3 (H) 11.4 - 15.2 seconds   INR 1.2 0.8 - 1.2    Comment: (NOTE) INR goal varies based on device and disease states. Performed at [facility], [place] [address] [place], [place] [address]   Vitamin B12     Status: Abnormal   Collection Time: 08/27/21  9:26 AM  Result Value Ref Range   Vitamin B-12 1,947 (H) 180 - 914 pg/mL    Comment: RESULTS CONFIRMED BY MANUAL DILUTION (NOTE) This assay is not validated for testing neonatal or myeloproliferative syndrome specimens for Vitamin B12 levels. Performed at [facility], [place] [address], [place], [place] [address]   Iron and TIBC     Status: Abnormal   Collection Time: 08/27/21  9:26 AM  Result Value Ref Range   Iron 36 (L) 45 - 182 ug/dL   TIBC 241 (L) 250 - 450 ug/dL   Saturation Ratios 15 (L) 17.9 - 39.5 %   UIBC 205 ug/dL    Comment: Performed at [facility], [place] [address], [place], [place] [address]  Ferritin     Status: None   Collection Time: 08/27/21  9:26 AM  Result Value Ref Range   Ferritin 321 24 - 336 ng/mL    Comment: Performed at [facility], [place] [address] [place], [place] [address]  Glucose, capillary     Status: None   Collection Time: 08/27/21 11:15 AM  Result Value Ref Range   Glucose-Capillary 90 70 - 99 mg/dL    Comment: Glucose reference range applies only to samples taken after fasting for at least 8 hours.  Hemoglobin and hematocrit, blood     Status: Abnormal   Collection Time: 08/27/21  4:20 PM  Result Value Ref Range   Hemoglobin 9.3 (L) 13.0 - 17.0 g/dL    Comment: REPEATED TO VERIFY POST TRANSFUSION SPECIMEN    HCT 29.8 (L) 39.0 - 52.0 %    Comment: Performed at [facility], [place] [address], [place], [place] [address]  Glucose, capillary     Status: None   Collection Time: 08/27/21  5:24 PM  Result Value Ref Range   Glucose-Capillary 79 70 - 99 mg/dL    Comment: Glucose reference range applies only to samples taken after fasting for at least 8 hours.  Glucose, capillary     Status: Abnormal   Collection Time: 08/27/21  9:42 PM  Result Value Ref Range   Glucose-Capillary 142 (H) 70 - 99 mg/dL    Comment: Glucose reference range applies only to samples taken after fasting for at least 8 hours.  Hemoglobin and  hematocrit, blood     Status: Abnormal   Collection Time: 08/27/21 11:04 PM  Result Value Ref Range   Hemoglobin 8.5 (L) 13.0 - 17.0 g/dL   HCT 26.5 (L) 39.0 - 52.0 %    Comment: Performed at [facility], [place] [address] [place], [place] [address]  Comprehensive metabolic panel     Status: Abnormal   Collection Time: 08/28/21  4:07 AM  Result Value Ref Range   Sodium 140 135 - 145 mmol/L   Potassium 3.8 3.5 - 5.1 mmol/L   Chloride 116 (H) 98 - 111 mmol/L   CO2 18 (L) 22 - 32 mmol/L   Glucose, Bld 100 (H) 70 - 99 mg/dL     Comment: Glucose reference range applies only to samples taken after fasting for at least 8 hours.   BUN 18 8 - 23 mg/dL   Creatinine, Ser 1.16 0.61 - 1.24 mg/dL   Calcium 8.2 (L) 8.9 - 10.3 mg/dL   Total Protein 5.7 (L) 6.5 - 8.1 g/dL   Albumin 3.0 (L) 3.5 - 5.0 g/dL   AST 33 15 - 41 U/L   ALT 45 (H) 0 - 44 U/L   Alkaline Phosphatase 224 (H) 38 - 126 U/L   Total Bilirubin 0.8 0.3 - 1.2 mg/dL   GFR, Estimated >60 >60 mL/min    Comment: (NOTE) Calculated using the CKD-EPI Creatinine Equation (2021)    Anion gap 6 5 - 15    Comment: Performed at [facility], [place] [address] [place], [place] [address]  CBC     Status: Abnormal   Collection Time: 08/28/21  4:07 AM  Result Value Ref Range   WBC 8.4 4.0 - 10.5 K/uL   RBC 2.26 (L) 4.22 - 5.81 MIL/uL   Hemoglobin 7.3 (L) 13.0 - 17.0 g/dL   HCT 23.0 (L) 39.0 - 52.0 %   MCV 101.8 (H) 80.0 - 100.0 fL   MCH 32.3 26.0 - 34.0 pg   MCHC 31.7 30.0 - 36.0 g/dL   RDW 18.9 (H) 11.5 - 15.5 %   Platelets 132 (L) 150 - 400 K/uL   nRBC 0.0 0.0 - 0.2 %    Comment: Performed at [facility], [place] [address], [place], [place] [address]  Glucose, capillary     Status: None   Collection Time: 08/28/21  7:37 AM  Result Value Ref Range   Glucose-Capillary 98 70 - 99 mg/dL    Comment: Glucose reference range applies only to samples taken after fasting for at least 8 hours.    Assessment/Plan: Malignant pancreatic adenocarcinoma  Currently on chemotherapy following with Dr. 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  Schedule for colonoscopy tomorrow No active bleeding seen on recent CTA Nulytely prep, clear liquid diet, NPO at midnight.  I thoroughly discussed the procedure with the patient (at bedside) to include nature of the procedure, alternatives, benefits, and risks (including but not limited to bleeding, infection, perforation,  anesthesia/cardiac pulmonary complications).  Patient verbalized understanding and gave verbal consent to proceed with Colonoscopy.    Vladimir Crofts PA-C 08/28/2021, 10:49 AM  Contact #  [phone]

## 2021-08-28 NOTE — Progress Notes (Signed)
PROGRESS NOTE  Aaron Johnston  DOB: 27-Mar-1946  PCP: [name], [name], MD YHC:[id number]  DOA: 08/27/2021  LOS: 1 day  Hospital Day: 2   Chief Complaint  Patient presents with   Rectal Bleeding    Brief narrative: Aaron Johnston is a 75 y.o. 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 Patient presented to the ED on 10/13 with complaint of 3-4 episodes of dark stool with small amount of blood in 24 hours.  In the ED, he was hemodynamically stable Labs showed hemoglobin low at 6.3, INR elevated to more than 10, platelet 169 CT abdomen pelvis showed moderately distended stomach with no active intraluminal hemorrhage, extensive periesophageal and gastric varices in the setting of chronic portal venous occlusion and known infiltrating pancreatic mass  Patient was given 10 mg IV vitamin K, blood transfusion and admitted to hospital service GI consulted and underwent EGD. See below for details.  Subjective: Patient was seen and examined this morning.  Pleasant elderly African-American male.  Lying on bed.  Not in distress.  Wife at bedside.  Patient apparently had 2 episodes of rectal bleeding last night.  Denies abdominal pain, nausea vomiting at this time. Chart reviewed Hemodynamically stable. Hemoglobin seems to be trending down again after an initial improvement with transfusion.  Assessment/Plan: Acute GI bleeding -Presented with black stool and blood in stool, supratherapeutic INR in the setting of apparently cancer with liver mets -EGD 10/13 showed grade 1 esophageal varices, nonbleeding gastric varices, normal duodenum -However hemoglobin is dropping again.  Noted a plan to do colonoscopy tomorrow. -Continue PPI.  Acute on chronic blood loss anemia -Due to chronic GI bleeding related to varices -Presented with low hemoglobin of 6.6.  2  units PRBCs given with improvement to 9.3 but in 12 hours hemoglobin drifted down to 7.3 again.  Repeat CBC tomorrow. -Further work-up per GI. Recent Labs    08/27/21 0619 08/27/21 0622 08/27/21 0926 08/27/21 1620 08/27/21 2304 08/28/21 0407  HGB 6.6*  --  6.5* 9.3* 8.5* 7.3*  MCV 108.5*  --   --   --   --  101.8*  VITAMINB12  --   --  1,947*  --   --   --   FOLATE  --  14.0  --   --   --   --   FERRITIN  --   --  321  --   --   --   TIBC  --   --  241*  --   --   --   IRON  --   --  36*  --   --   --   RETICCTPCT  --  1.7  --   --   --   --    Gastric/esophageal varices -Per GI, can consider low-dose Inderal at 20 mg twice daily to decrease portal pressures -GI discussed the case with IR and unfortunately based on the distorted anatomy, patient is not a candidate for TIPS or BRTO at this time.  Pancreatic cancer with liver mets Chronic pain -Oncologist Dr. Learta Codding  Chronic diarrhea -Probably due to EPI related to pancreatic cancer  -On Creon, Imodium as needed, electrolyte supplements.  Supratherapeutic INR -Initial INR of more than 10 without being on anticoagulation.  True versus lab error.   -Vitamin K 10 mg IV 1 dose was given in the ED.  Repeat INR this morning is much  better at 1.2. Recent Labs  Lab 08/27/21 0251 08/27/21 0926  INR >10.0* 1.2   Type 2 diabetes mellitus -A1c 6.1 on 10/13 -Home meds include Actos 45 mg daily, -Currently on sliding scales with Accu-Cheks -Blood sugar level controlled Recent Labs  Lab 08/27/21 1115 08/27/21 1724 08/27/21 2142 08/28/21 0737 08/28/21 1217  GLUCAP 90 79 142* 98 177*   Hyperlipidemia -Statin held because of liver dysfunction   Severe protein-calorie malnutrition -dietary consult   Glaucoma -Continue eyedrops  Mobility: Encourage ambulation Code Status:   Code Status: Full Code  Nutritional status: Body mass index is 20.25 kg/m.     Diet:  Diet Order             Diet NPO time specified  Diet  effective midnight           Diet clear liquid Room service appropriate? Yes; Fluid consistency: Thin  Diet effective now                  DVT prophylaxis:  SCDs Start: 08/27/21 0839   Antimicrobials: None Fluid: None Consultants: Oncology, GI Family Communication: Wife at bedside  Status is: Inpatient  Remains inpatient appropriate because: Pending GI work-up  Dispo: The patient is from: Home              Anticipated d/c is to: Home in 2 to 3 days              Patient currently is not medically stable to d/c.   Difficult to place patient No     Infusions:   sodium chloride 20 mL/hr at 08/28/21 3790   lactated ringers Stopped (08/27/21 1800)    Scheduled Meds:  Chlorhexidine Gluconate Cloth  6 each Topical Daily   insulin aspart  0-6 Units Subcutaneous TID WC   latanoprost  1 drop Both Eyes QHS   lipase/protease/amylase  72,000 Units Oral TID WC   pantoprazole  40 mg Oral Daily   propranolol  20 mg Oral BID   sodium chloride flush  10-40 mL Intracatheter Q12H    Antimicrobials: Anti-infectives (From admission, onward)    Start     Dose/Rate Route Frequency Ordered Stop   08/27/21 0500  piperacillin-tazobactam (ZOSYN) IVPB 3.375 g        3.375 g 100 mL/hr over 30 Minutes Intravenous  Once 08/27/21 0451 08/27/21 0539       PRN meds: HYDROcodone-acetaminophen, loperamide, morphine injection, ondansetron **OR** ondansetron (ZOFRAN) IV, sodium chloride flush   Objective: Vitals:   08/28/21 0543 08/28/21 1220  BP: (!) 106/56 (!) 97/52  Pulse: 73 (!) 58  Resp: 16 16  Temp: 99.2 F (37.3 C) 97.8 F (36.6 C)  SpO2: 100% 100%    Intake/Output Summary (Last 24 hours) at 08/28/2021 1402 Last data filed at 08/28/2021 1100 Gross per 24 hour  Intake 950.26 ml  Output --  Net 950.26 ml   Filed Weights   08/27/21 0232 08/27/21 0552 08/27/21 1318  Weight: 62 kg 60.4 kg 60.4 kg   Weight change: -1.6 kg Body mass index is 20.25 kg/m.   Physical  Exam: General exam: Pleasant, elderly African-American male Skin: No rashes, lesions or ulcers. HEENT: Atraumatic, normocephalic, no obvious bleeding Lungs: Clear to auscultation bilaterally CVS: Regular rate and rhythm, no murmur GI/Abd soft, nontender, nondistended, bowel sound present CNS: Alert, awake, oriented x3 Psychiatry: Mood appropriate Extremities: No pedal edema, no calf tenderness  Data Review: I have personally reviewed the laboratory data and studies available.  Recent Labs  Lab 08/27/21 0251 08/27/21 0619 08/27/21 0926 08/27/21 1620 08/27/21 2304 08/28/21 0407  WBC 8.7 8.4  --   --   --  8.4  NEUTROABS 7.1  --   --   --   --   --   HGB 6.3* 6.6* 6.5* 9.3* 8.5* 7.3*  HCT 19.7* 21.7* 20.8* 29.8* 26.5* 23.0*  MCV 101.5* 108.5*  --   --   --  101.8*  PLT 172 169  --   --   --  132*   Recent Labs  Lab 08/27/21 0251 08/27/21 0619 08/27/21 0926 08/28/21 0407  NA 140 142 139 140  K 4.0 4.7 4.4 3.8  CL 117* 119* 115* 116*  CO2 16* 18* 18* 18*  GLUCOSE 165* 127* 95 100*  BUN 20 22 22 18   CREATININE 0.92 1.16 1.10 1.16  CALCIUM 7.2* 8.6* 8.4* 8.2*    F/u labs ordered Unresulted Labs (From admission, onward)    None       Signed, Terrilee Croak, MD [facility] 08/28/2021

## 2021-08-28 NOTE — Progress Notes (Signed)
[facility] Gastroenterology Progress Note  Aaron Johnston 75 y.o. [birth year]  CC: Anemia and rectal bleeding  Subjective: Patient had endoscopy yesterday with Dr. Watt Climes.  This was unrevealing for source of bleeding. Patient had 2 episodes of dark red blood with diarrhea last night.  Denies AB pain, denies nausea, vomiting, fever, chills.  Patient ate breakfast this AM without issues.   ROS : Review of Systems  Constitutional:  Negative for chills and fever.  Respiratory:  Negative for cough and shortness of breath.   Cardiovascular:  Negative for chest pain.  Gastrointestinal:  Positive for blood in stool and diarrhea. Negative for abdominal pain, constipation, heartburn, melena, nausea and vomiting.  Musculoskeletal:  Negative for falls.  Neurological:  Negative for dizziness.  Psychiatric/Behavioral:  Negative for memory loss.    Objective: Vital signs in last 24 hours: Vitals:   08/27/21 2146 08/28/21 0543  BP: (!) 123/54 (!) 106/56  Pulse: 80 73  Resp: 16 16  Temp: 98.6 F (37 C) 99.2 F (37.3 C)  SpO2: 100% 100%    Physical Exam: General:   Alert, cachectic male, in NAD Heart:  Regular rate and rhythm; no murmurs, left porta-cath in place Pulm: Clear anteriorly; no wheezing Abdomen:  Soft, Flat AB, skin exam normal, Normal bowel sounds.  No  tenderness  on exam . Without guarding and Without rebound, without hepatomegaly. Extremities:  Without edema. Neurologic:  Alert and  oriented x4;  grossly normal neurologically. Psych:  Alert and cooperative. Normal mood and affect.  Lab Results: Recent Labs    08/27/21 0926 08/28/21 0407  NA 139 140  K 4.4 3.8  CL 115* 116*  CO2 18* 18*  GLUCOSE 95 100*  BUN 22 18  CREATININE 1.10 1.16  CALCIUM 8.4* 8.2*   Recent Labs    08/27/21 0926 08/28/21 0407  AST 39 33  ALT 50* 45*  ALKPHOS 236* 224*  BILITOT 0.6 0.8  PROT 5.7* 5.7*  ALBUMIN 3.3* 3.0*   Recent Labs    08/27/21 0251 08/27/21 0619 08/27/21 0926  08/27/21 2304 08/28/21 0407  WBC 8.7 8.4  --   --  8.4  NEUTROABS 7.1  --   --   --   --   HGB 6.3* 6.6*   < > 8.5* 7.3*  HCT 19.7* 21.7*   < > 26.5* 23.0*  MCV 101.5* 108.5*  --   --  101.8*  PLT 172 169  --   --  132*   < > = values in this interval not displayed.   Recent Labs    08/27/21 0251 08/27/21 0926  LABPROT >90.0* 15.3*  INR >10.0* 1.2    Lab Results: Results for orders placed or performed during the hospital encounter of 08/27/21 (from the past 48 hour(s))  Resp Panel by RT-PCR (Flu A&B, Covid) Nasopharyngeal Swab     Status: None   Collection Time: 08/27/21  2:40 AM   Specimen: Nasopharyngeal Swab; Nasopharyngeal(NP) swabs in vial transport medium  Result Value Ref Range   SARS Coronavirus 2 by RT PCR NEGATIVE NEGATIVE    Comment: (NOTE) SARS-CoV-2 target nucleic acids are NOT DETECTED.  The SARS-CoV-2 RNA is generally detectable in upper respiratory specimens during the acute phase of infection. The lowest concentration of SARS-CoV-2 viral copies this assay can detect is 138 copies/mL. A negative result does not preclude SARS-Cov-2 infection and should not be used as the sole basis for treatment or other patient management decisions. A negative result may occur with  improper specimen collection/handling, submission of specimen other than nasopharyngeal swab, presence of viral mutation(s) within the areas targeted by this assay, and inadequate number of viral copies(<138 copies/mL). A negative result must be combined with clinical observations, patient history, and epidemiological information. The expected result is Negative.  Fact Sheet for Patients:  EntrepreneurPulse.com.au  Fact Sheet for Healthcare Providers:  IncredibleEmployment.be  This test is no t yet approved or cleared by the [place] FDA and  has been authorized for detection and/or diagnosis of SARS-CoV-2 by FDA under an Emergency Use Authorization  (EUA). This EUA will remain  in effect (meaning this test can be used) for the duration of the COVID-19 declaration under Section 564(b)(1) of the Act, 21 U.S.C.section 360bbb-3(b)(1), unless the authorization is terminated  or revoked sooner.       Influenza A by PCR NEGATIVE NEGATIVE   Influenza B by PCR NEGATIVE NEGATIVE    Comment: (NOTE) The Xpert Xpress SARS-CoV-2/FLU/RSV plus assay is intended as an aid in the diagnosis of influenza from Nasopharyngeal swab specimens and should not be used as a sole basis for treatment. Nasal washings and aspirates are unacceptable for Xpert Xpress SARS-CoV-2/FLU/RSV testing.  Fact Sheet for Patients: EntrepreneurPulse.com.au  Fact Sheet for Healthcare Providers: IncredibleEmployment.be  This test is not yet approved or cleared by the [place] FDA and has been authorized for detection and/or diagnosis of SARS-CoV-2 by FDA under an Emergency Use Authorization (EUA). This EUA will remain in effect (meaning this test can be used) for the duration of the COVID-19 declaration under Section 564(b)(1) of the Act, 21 U.S.C. section 360bbb-3(b)(1), unless the authorization is terminated or revoked.  Performed at [facility], [address] [place], [place] [address]   Occult blood card to lab, stool     Status: Abnormal   Collection Time: 08/27/21  2:47 AM  Result Value Ref Range   Fecal Occult Bld POSITIVE (A) NEGATIVE    Comment: Performed at [facility], [place], [place] [address]  CBC with Differential/Platelet     Status: Abnormal   Collection Time: 08/27/21  2:51 AM  Result Value Ref Range   WBC 8.7 4.0 - 10.5 K/uL   RBC 1.94 (L) 4.22 - 5.81 MIL/uL   Hemoglobin 6.3 (LL) 13.0 - 17.0 g/dL    Comment: REPEATED TO VERIFY THIS CRITICAL RESULT HAS VERIFIED AND BEEN CALLED TO L. VENEGAS RN BY CYNTHIA HARDIN ON 10 13 2022 AT 0301, AND HAS BEEN  READ BACK.     HCT 19.7 (L) 39.0 - 52.0 %   MCV 101.5 (H) 80.0 - 100.0 fL   MCH 32.5 26.0 - 34.0 pg   MCHC 32.0 30.0 - 36.0 g/dL   RDW 16.7 (H) 11.5 - 15.5 %   Platelets 172 150 - 400 K/uL   nRBC 0.0 0.0 - 0.2 %   Neutrophils Relative % 81 %   Neutro Abs 7.1 1.7 - 7.7 K/uL   Lymphocytes Relative 8 %   Lymphs Abs 0.7 0.7 - 4.0 K/uL   Monocytes Relative 6 %   Monocytes Absolute 0.5 0.1 - 1.0 K/uL   Eosinophils Relative 3 %   Eosinophils Absolute 0.3 0.0 - 0.5 K/uL   Basophils Relative 1 %   Basophils Absolute 0.1 0.0 - 0.1 K/uL   Immature Granulocytes 1 %   Abs Immature Granulocytes 0.09 (H) 0.00 - 0.07 K/uL    Comment: Performed at [facility], [address], [place], [place] [address]  Comprehensive metabolic panel     Status: Abnormal   Collection Time: 08/27/21  2:51 AM  Result Value Ref Range   Sodium 140 135 - 145 mmol/L   Potassium 4.0 3.5 - 5.1 mmol/L   Chloride 117 (H) 98 - 111 mmol/L   CO2 16 (L) 22 - 32 mmol/L   Glucose, Bld 165 (H) 70 - 99 mg/dL    Comment: Glucose reference range applies only to samples taken after fasting for at least 8 hours.   BUN 20 8 - 23 mg/dL   Creatinine, Ser 0.92 0.61 - 1.24 mg/dL   Calcium 7.2 (L) 8.9 - 10.3 mg/dL   Total Protein 5.4 (L) 6.5 - 8.1 g/dL   Albumin 3.2 (L) 3.5 - 5.0 g/dL   AST 39 15 - 41 U/L   ALT 45 (H) 0 - 44 U/L   Alkaline Phosphatase 234 (H) 38 - 126 U/L   Total Bilirubin 0.5 0.3 - 1.2 mg/dL   GFR, Estimated >60 >60 mL/min    Comment: (NOTE) Calculated using the CKD-EPI Creatinine Equation (2021)    Anion gap 7 5 - 15    Comment: Performed at [facility], [address] [place], [place] [address]  Protime-INR     Status: Abnormal   Collection Time: 08/27/21  2:51 AM  Result Value Ref Range   Prothrombin Time >90.0 (H) 11.4 - 15.2 seconds   INR >10.0 (HH) 0.8 - 1.2    Comment: CRITICAL RESULT CALLED TO, READ BACK BY AND VERIFIED WITH: Burgess Amor RN AT 0320 08/27/21  CH (NOTE) INR goal varies based on device and disease states. Performed at [facility], [address] [place], [place] [address]   CBC     Status: Abnormal   Collection Time: 08/27/21  6:19 AM  Result Value Ref Range   WBC 8.4 4.0 - 10.5 K/uL   RBC 2.00 (L) 4.22 - 5.81 MIL/uL   Hemoglobin 6.6 (LL) 13.0 - 17.0 g/dL    Comment: REPEATED TO VERIFY THIS CRITICAL RESULT HAS VERIFIED AND BEEN CALLED TO ANNNOH,P. RN BY NICOLE MCCOY ON 10 13 2022 AT 0703, AND HAS BEEN READ BACK. CRITICAL RESULT VERIFIED    HCT 21.7 (L) 39.0 - 52.0 %   MCV 108.5 (H) 80.0 - 100.0 fL   MCH 33.0 26.0 - 34.0 pg   MCHC 30.4 30.0 - 36.0 g/dL   RDW 17.1 (H) 11.5 - 15.5 %   Platelets 169 150 - 400 K/uL   nRBC 0.0 0.0 - 0.2 %    Comment: Performed at [facility], [place] [address], [place], [place] [address]  Basic metabolic panel     Status: Abnormal   Collection Time: 08/27/21  6:19 AM  Result Value Ref Range   Sodium 142 135 - 145 mmol/L   Potassium 4.7 3.5 - 5.1 mmol/L   Chloride 119 (H) 98 - 111 mmol/L   CO2 18 (L) 22 - 32 mmol/L   Glucose, Bld 127 (H) 70 - 99 mg/dL    Comment: Glucose reference range applies only to samples taken after fasting for at least 8 hours.   BUN 22 8 - 23 mg/dL   Creatinine, Ser 1.16 0.61 - 1.24 mg/dL   Calcium 8.6 (L) 8.9 - 10.3 mg/dL   GFR, Estimated >60 >60 mL/min    Comment: (NOTE) Calculated using the CKD-EPI Creatinine Equation (2021)    Anion gap 5 5 - 15    Comment: Performed at [facility]  [place] [address], [place], [place] [address]  Type and screen [place]     Status: None   Collection Time: 08/27/21  6:22 AM  Result Value Ref Range   ABO/RH(D) A POS    Antibody Screen NEG    Sample Expiration 08/30/2021,2359    Unit Number [id number]    Blood Component Type RED CELLS,LR    Unit division 00    Status of Unit ISSUED,FINAL    Transfusion Status OK TO TRANSFUSE    Crossmatch  Result Compatible    Unit Number [id number]    Blood Component Type RED CELLS,LR    Unit division 00    Status of Unit ISSUED,FINAL    Transfusion Status OK TO TRANSFUSE    Crossmatch Result      Compatible Performed at [facility], [place] [address], [place], [place] [address]   Hemoglobin A1c     Status: Abnormal   Collection Time: 08/27/21  6:22 AM  Result Value Ref Range   Hgb A1c MFr Bld 6.1 (H) 4.8 - 5.6 %    Comment: (NOTE) Pre diabetes:          5.7%-6.4%  Diabetes:              >6.4%  Glycemic control for   <7.0% adults with diabetes    Mean Plasma Glucose 128.37 mg/dL    Comment: Performed at [place] [address], [place], [place] [address]  Folate     Status: None   Collection Time: 08/27/21  6:22 AM  Result Value Ref Range   Folate 14.0 >5.9 ng/mL    Comment: Performed at [facility], [place] [address] [place], [place] [address]  Reticulocytes     Status: Abnormal   Collection Time: 08/27/21  6:22 AM  Result Value Ref Range   Retic Ct Pct 1.7 0.4 - 3.1 %   RBC. 2.03 (L) 4.22 - 5.81 MIL/uL   Retic Count, Absolute 33.7 19.0 - 186.0 K/uL   Immature Retic Fract 14.3 2.3 - 15.9 %    Comment: Performed at [facility], [place] [address], [place], [place] [address]  Prepare RBC (crossmatch)     Status: None   Collection Time: 08/27/21  8:09 AM  Result Value Ref Range   Order Confirmation      ORDER PROCESSED BY BLOOD BANK Performed at [facility], [place] [address], [place], [place] [address]   Comprehensive metabolic panel     Status: Abnormal   Collection Time: 08/27/21  9:26 AM  Result Value Ref Range   Sodium 139 135 - 145 mmol/L   Potassium 4.4 3.5 - 5.1 mmol/L   Chloride 115 (H) 98 - 111 mmol/L   CO2 18 (L) 22 - 32 mmol/L   Glucose, Bld 95 70 - 99 mg/dL    Comment: Glucose reference range applies only to samples taken after fasting for at least 8 hours.   BUN 22 8 - 23  mg/dL   Creatinine, Ser 1.10 0.61 - 1.24 mg/dL   Calcium 8.4 (L) 8.9 - 10.3 mg/dL   Total Protein 5.7 (L) 6.5 - 8.1 g/dL   Albumin 3.3 (L) 3.5 - 5.0 g/dL   AST 39 15 - 41 U/L   ALT 50 (H) 0 - 44 U/L   Alkaline Phosphatase 236 (H) 38 - 126 U/L   Total Bilirubin 0.6 0.3 - 1.2 mg/dL   GFR,  Estimated >60 >60 mL/min    Comment: (NOTE) Calculated using the CKD-EPI Creatinine Equation (2021)    Anion gap 6 5 - 15    Comment: Performed at [facility], [place] [address] [place], [place] [address]  Hemoglobin and hematocrit, blood     Status: Abnormal   Collection Time: 08/27/21  9:26 AM  Result Value Ref Range   Hemoglobin 6.5 (LL) 13.0 - 17.0 g/dL    Comment: CRITICAL VALUE NOTED.  VALUE IS CONSISTENT WITH PREVIOUSLY REPORTED AND CALLED VALUE. REPEATED TO VERIFY    HCT 20.8 (L) 39.0 - 52.0 %    Comment: Performed at [facility], [place] [address] [place], [place] [address]  Protime-INR     Status: Abnormal   Collection Time: 08/27/21  9:26 AM  Result Value Ref Range   Prothrombin Time 15.3 (H) 11.4 - 15.2 seconds   INR 1.2 0.8 - 1.2    Comment: (NOTE) INR goal varies based on device and disease states. Performed at [facility], [place] [address] [place], [place] [address]   Vitamin B12     Status: Abnormal   Collection Time: 08/27/21  9:26 AM  Result Value Ref Range   Vitamin B-12 1,947 (H) 180 - 914 pg/mL    Comment: RESULTS CONFIRMED BY MANUAL DILUTION (NOTE) This assay is not validated for testing neonatal or myeloproliferative syndrome specimens for Vitamin B12 levels. Performed at [facility], [place] [address] [place], [place] [address]   Iron and TIBC     Status: Abnormal   Collection Time: 08/27/21  9:26 AM  Result Value Ref Range   Iron 36 (L) 45 - 182 ug/dL   TIBC 241 (L) 250 - 450 ug/dL   Saturation Ratios 15 (L) 17.9 - 39.5 %   UIBC 205 ug/dL    Comment: Performed at [facility], [place] [address] [place], [place] [address]  Ferritin     Status: None   Collection Time: 08/27/21  9:26 AM  Result Value Ref Range   Ferritin 321 24 - 336 ng/mL    Comment: Performed at [facility], [place] [address] [place], [place] [address]  Glucose, capillary     Status: None   Collection Time: 08/27/21 11:15 AM  Result Value Ref Range   Glucose-Capillary 90 70 - 99 mg/dL    Comment: Glucose reference range applies only to samples taken after fasting for at least 8 hours.  Hemoglobin and hematocrit, blood     Status: Abnormal   Collection Time: 08/27/21  4:20 PM  Result Value Ref Range   Hemoglobin 9.3 (L) 13.0 - 17.0 g/dL    Comment: REPEATED TO VERIFY POST TRANSFUSION SPECIMEN    HCT 29.8 (L) 39.0 - 52.0 %    Comment: Performed at [facility], [place] [address], [place], [place] [address]  Glucose, capillary     Status: None   Collection Time: 08/27/21  5:24 PM  Result Value Ref Range   Glucose-Capillary 79 70 - 99 mg/dL    Comment: Glucose reference range applies only to samples taken after fasting for at least 8 hours.  Glucose, capillary     Status: Abnormal   Collection Time: 08/27/21  9:42 PM  Result Value Ref Range   Glucose-Capillary 142 (H) 70 - 99 mg/dL    Comment: Glucose reference range applies only to samples taken after fasting for at least 8 hours.  Hemoglobin and  hematocrit, blood     Status: Abnormal   Collection Time: 08/27/21 11:04 PM  Result Value Ref Range   Hemoglobin 8.5 (L) 13.0 - 17.0 g/dL   HCT 26.5 (L) 39.0 - 52.0 %    Comment: Performed at [facility], [place] [address] [place], [place] [address]  Comprehensive metabolic panel     Status: Abnormal   Collection Time: 08/28/21  4:07 AM  Result Value Ref Range   Sodium 140 135 - 145 mmol/L   Potassium 3.8 3.5 - 5.1 mmol/L   Chloride 116 (H) 98 - 111 mmol/L   CO2 18 (L) 22 - 32 mmol/L   Glucose, Bld 100 (H) 70 - 99 mg/dL     Comment: Glucose reference range applies only to samples taken after fasting for at least 8 hours.   BUN 18 8 - 23 mg/dL   Creatinine, Ser 1.16 0.61 - 1.24 mg/dL   Calcium 8.2 (L) 8.9 - 10.3 mg/dL   Total Protein 5.7 (L) 6.5 - 8.1 g/dL   Albumin 3.0 (L) 3.5 - 5.0 g/dL   AST 33 15 - 41 U/L   ALT 45 (H) 0 - 44 U/L   Alkaline Phosphatase 224 (H) 38 - 126 U/L   Total Bilirubin 0.8 0.3 - 1.2 mg/dL   GFR, Estimated >60 >60 mL/min    Comment: (NOTE) Calculated using the CKD-EPI Creatinine Equation (2021)    Anion gap 6 5 - 15    Comment: Performed at [facility], [place] [address], [place], [place] [address]  CBC     Status: Abnormal   Collection Time: 08/28/21  4:07 AM  Result Value Ref Range   WBC 8.4 4.0 - 10.5 K/uL   RBC 2.26 (L) 4.22 - 5.81 MIL/uL   Hemoglobin 7.3 (L) 13.0 - 17.0 g/dL   HCT 23.0 (L) 39.0 - 52.0 %   MCV 101.8 (H) 80.0 - 100.0 fL   MCH 32.3 26.0 - 34.0 pg   MCHC 31.7 30.0 - 36.0 g/dL   RDW 18.9 (H) 11.5 - 15.5 %   Platelets 132 (L) 150 - 400 K/uL   nRBC 0.0 0.0 - 0.2 %    Comment: Performed at [facility], [place] [address], [place], [place] [address]  Glucose, capillary     Status: None   Collection Time: 08/28/21  7:37 AM  Result Value Ref Range   Glucose-Capillary 98 70 - 99 mg/dL    Comment: Glucose reference range applies only to samples taken after fasting for at least 8 hours.    Assessment/Plan: Malignant pancreatic adenocarcinoma  Currently on chemotherapy following with Dr. 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  Schedule for colonoscopy tomorrow No active bleeding seen on recent CTA Nulytely prep, clear liquid diet, NPO at midnight.  I thoroughly discussed the procedure with the patient (at bedside) to include nature of the procedure, alternatives, benefits, and risks (including but not limited to bleeding, infection, perforation,  anesthesia/cardiac pulmonary complications).  Patient verbalized understanding and gave verbal consent to proceed with Colonoscopy.    Vladimir Crofts PA-C 08/28/2021, 10:49 AM  Contact #  [phone]

## 2021-08-28 NOTE — Progress Notes (Signed)
IP PROGRESS NOTE  Subjective:   Aaron Johnston is well-known to me with a history of metastatic pancreas cancer.  He was most recently treated with gemcitabine/Abraxane on 08/06/2021.  Treatment was placed on hold last week after a liver MRI revealed disease progression.  He reports the onset of rectal bleeding beginning 08/26/2021.  He presented to the emergency room yesterday and was noted to have severe anemia.  The stool returned Hemoccult positive.  He was admitted for further evaluation.  An upper endoscopy yesterday revealed no apparent source of bleeding.  [name] reports feeling better after Red cell transfusions yesterday. Objective: Vital signs in last 24 hours: Blood pressure (!) 97/52, pulse (!) 58, temperature 97.8 F (36.6 C), temperature source Oral, resp. rate 16, height '5\' 8"'  (1.727 m), weight 133 lb 2.5 oz (60.4 kg), SpO2 100 %.  Intake/Output from previous day: 10/13 0701 - 10/14 0700 In: 1199.3 [P.O.:240; I.V.:290.3; Blood:669] Out: 750 [Urine:750]  Physical Exam:  Lungs: Clear bilaterally Cardiac: Regular rate and rhythm Abdomen: No hepatosplenomegaly, no mass, nontender Extremities: No leg edema   Portacath/PICC-without erythema  Lab Results: Recent Labs    08/27/21 0619 08/27/21 0926 08/27/21 2304 08/28/21 0407  WBC 8.4  --   --  8.4  HGB 6.6*   < > 8.5* 7.3*  HCT 21.7*   < > 26.5* 23.0*  PLT 169  --   --  132*   < > = values in this interval not displayed.    BMET Recent Labs    08/27/21 0926 08/28/21 0407  NA 139 140  K 4.4 3.8  CL 115* 116*  CO2 18* 18*  GLUCOSE 95 100*  BUN 22 18  CREATININE 1.10 1.16  CALCIUM 8.4* 8.2*    Lab Results  Component Value Date   CAN199 3,411 (H) 08/20/2021    Studies/Results: CT Angio Abd/Pel w/ and/or w/o  Result Date: 08/27/2021 CLINICAL DATA:  Acute, nonlocalized abdominal pain blood in stool yesterday EXAM: CT ANGIOGRAPHY ABDOMEN TECHNIQUE: Multidetector CT imaging of the abdomen was  performed using the standard protocol during bolus administration of intravenous contrast. Multiplanar reconstructed images and MIPs were obtained and reviewed to evaluate the vascular anatomy. CONTRAST:  127m OMNIPAQUE IOHEXOL 350 MG/ML SOLN COMPARISON:  MR of the abdomen from 8 days ago FINDINGS: VASCULAR Aorta: Diffuse atheromatous wall thickening of the aorta. Negative for aneurysm or dissection Celiac: Pancreatic mass with stable irregular and beaded appearance to the left gastric, splenic, and proximal hepatic arteries. No acute finding SMA: No acute finding or aneurysm. No evidence of angio dysplasia. No stenosis. Renals: Accessory right lower pole artery. No acute vascular finding. 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 Multiple varices in the upper abdomen including periesophageal and gastric. Splenomegaly. No acute venous occlusion is noted. Portal vein remains occluded beginning at the level of the infiltrative pancreas mass. Review of the MIP images confirms the above findings. NON-VASCULAR Lower chest: Extensive reticulonodular opacity at the bases. Staging chest CT was performed yesterday. Hepatobiliary: Low-density liver masses attributed to metastatic disease given change from 2021, recently evaluated by MRI. The largest is in the lower central liver on 11:24 at 26 mm. Pancreas: Pancreas mass with background pancreas atrophy, mass difficult to measure given diffuse infiltrative appearance in the retroperitoneum. Spleen: Enlarged in the setting of portal hypertension. Adrenals/Urinary Tract: No hydronephrosis or adrenal mass. Stomach/Bowel:  Moderate distension of the stomach by partially high-density semi-solid material. No active intraluminal bleeding is seen into stomach. There is submucosal low-density expansion of the ileum  and proximal colon. Negative for bowel obstruction. Additional area of thickening at the rectum where there are internal hemorrhoids associated with the portal hypertension. Lymphatic: No discrete and measurable lymphadenopathy. Other: No ascites or pneumoperitoneum Musculoskeletal: No acute or aggressive finding IMPRESSION: 1. The stomach is moderately distended, possible intraluminal clot if no recent meal. No active intraluminal hemorrhage detected. 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 2. Ileal and proximal colonic wall edema which could be colitis or portal congestion. 3. Infiltrating pancreas mass, reference recent staging MRI of the abdomen Electronically Signed   By: Jorje Guild M.D.   On: 08/27/2021 04:32    Medications: I have reviewed the patient's current medications.  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, mild biliary ductal dilatation, diffuse hypodense/hypoenhancing areas within the liver (edema versus infiltrating neoplasm), 2 x 1.7 cm area of focal prominence in the pancreas EUS 07/10/2020-no pancreas mass identified, 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, 1.5 cm aortocaval node biopsy-scant lymphoid material, no malignancy Diagnostic laparoscopy 07/17/2020-segment 3 and 4 liver nodules, adenocarcinoma, positive for pankeratin, CK7 and CK20, partially positive for TTF-1, negative for CDX2 Foundation 1-microsatellite stable, tumor mutation burden 4, K-ras G12D, subclonal RB1 alteration Elevated CA 19-9 Cycle 1 FOLFOX 07/28/2020 Cycle 2  FOLFOX 08/18/2020, oxaliplatin dose reduced due to thrombocytopenia Cycle 3 FOLFOX 09/08/2020 Cycle 4 FOLFOX 09/29/2020  Cycle 5 FOLFOX 10/20/2020 CTs 11/06/2020-grossly stable infiltrative mass within the pancreatic head and porta hepatis.  New small low-density liver lesions.  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 Cycle 6 FOLFOX 11/10/2021  Cycle 7 FOLFOX 12/01/2020 MRI liver 12/04/2020-no significant change in posttreatment appearance of the pancreatic head.  Numerous intrinsically low signal hypoenhancing lesions of the liver parenchyma predominantly observed in the right lobe of the liver and liver dome, measuring no greater than 8 mm and as seen on recent prior CT of the abdomen/pelvis. Cycle 8 FOLFOX 12/22/2020 Cycle 9 FOLFOX 01/12/2021 Cycle 10 FOLFOX 02/02/2021 MRI liver 02/19/2021-no change in pancreas head mass, multiple hypoenhancing liver lesions are new and increased in size, effacement of portal vein by pancreas head mass with cavernous transformation, no change in mild splenomegaly Cycle 1 gemcitabine/Abraxane 03/12/2021 Cycle 2 gemcitabine/Abraxane 03/27/2021- dose reductions secondary to neutropenia Cycle 3 gemcitabine/Abraxane 04/17/2021-dose reductions due to thrombocytopenia, white cell growth factor support added Cycle 4 gemcitabine/Abraxane 05/01/2021, Ziextenzo Cycle 5 gemcitabine/Abraxane 05/15/2021, Ziextenzo MRI abdomen 05/26/2021-mild enlargement of dominant hepatic metastases, appearance of infiltrative pancreas mass extending to the celiac trunk and SMA, splenic vein occlusion, cavernous transformation of the portal vein with upper abdomen collaterals, splenomegaly, mild upper abdominal ascites and mesenteric edema, minimal subpleural nodularity left lower lobe Cycle 6 gemcitabine/Abraxane 05/28/2021 Cycle 7 gemcitabine/Abraxane 06/12/2021 Cycle 8  gemcitabine/Abraxane 06/26/2021 Cycle 9 gemcitabine/Abraxane 07/10/2021 Cycle 10 gemcitabine/Abraxane 07/23/2021 Cycle 11 gemcitabine/Abraxane 08/06/2021 MRI abdomen 08/20/2021-multifocal nodularity in the lower chest with a new discrete right lower lobe nodule, enlarging subsegment 7/8  liver lesion, other small lesions appear stable, gastric varices, review of images with radiology-enlargement of at least 2 liver lesions, suspicious nodule n the right lower lung CT chest 06/26/2021-diffuse pulmonary nodularity and nodular consolidation favored to represent metastatic disease, hepatic metastasis, sclerotic lesion in the T3 vertebral body suspicious for metastasis   Chronic thrombocytopenia- weekly Nplate beginning 9/97/8776 Diabetes Hypertension Abdomen/back pain secondary to #1 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  He has most recently been treated with gemcitabine/Abraxane chemotherapy.  Treatment was placed on hold last week secondary to evidence of disease progression on a liver MRI.  The chest CT earlier this week is consistent with disease progression in the lungs and possibly a thoracic spine metastasis.  I reviewed the CT images.  I discussed the disease progression with Mr. Wojciak.  We discussed treatment options including comfort care, salvage systemic therapy with 5-FU/liposomal irinotecan, and referral to consider a clinical trial.  He will think about these options and return for a follow-up visit as scheduled on 09/02/2021.  He was admitted yesterday with GI  bleeding.  The bleeding appears to have slowed or resolved.  An upper endoscopy revealed varices but no apparent acute source of blood loss.  I suspect the bleeding is related to the pancreas tumor causing with direct invasion of the GI tract versus bleeding from portal hypertension.  He could also have bleeding from the lower GI tract.  Recommendations: Proceed with colonoscopy as recommended by GI Transfusion support as needed Outpatient follow-up at the Cancer center as scheduled on 09/02/2021 Please call oncology over the weekend as needed.  I will check on him 08/31/2021 if he remains in the hospital.      LOS: 1 day   Betsy Coder, MD   08/28/2021, 1:44 PM

## 2021-08-29 ENCOUNTER — Inpatient Hospital Stay (HOSPITAL_COMMUNITY): Payer: Medicare Other | Admitting: Anesthesiology

## 2021-08-29 ENCOUNTER — Encounter (HOSPITAL_COMMUNITY)
Admission: EM | Disposition: A | Discharge status: Home / Self Care | Payer: Self-pay | Source: Home / Self Care | Attending: Family Medicine

## 2021-08-29 ENCOUNTER — Encounter (HOSPITAL_COMMUNITY): Payer: Self-pay | Admitting: Internal Medicine

## 2021-08-29 DIAGNOSIS — E43 Unspecified severe protein-calorie malnutrition: Secondary | ICD-10-CM | POA: Diagnosis present

## 2021-08-29 DIAGNOSIS — K921 Melena: Secondary | ICD-10-CM | POA: Diagnosis not present

## 2021-08-29 HISTORY — PX: BIOPSY: SHX5522

## 2021-08-29 HISTORY — PX: COLONOSCOPY WITH PROPOFOL: SHX5780

## 2021-08-29 LAB — COMPREHENSIVE METABOLIC PANEL
ALT: 41 U/L (ref 0–44)
AST: 27 U/L (ref 15–41)
Albumin: 3.3 g/dL — ABNORMAL LOW (ref 3.5–5.0)
Alkaline Phosphatase: 222 U/L — ABNORMAL HIGH (ref 38–126)
Anion gap: 6 (ref 5–15)
BUN: 15 mg/dL (ref 8–23)
CO2: 19 mmol/L — ABNORMAL LOW (ref 22–32)
Calcium: 8.9 mg/dL (ref 8.9–10.3)
Chloride: 114 mmol/L — ABNORMAL HIGH (ref 98–111)
Creatinine, Ser: 1.04 mg/dL (ref 0.61–1.24)
GFR, Estimated: >60 mL/min (ref >60)
Glucose, Bld: 106 mg/dL — ABNORMAL HIGH (ref 70–99)
Potassium: 3.9 mmol/L (ref 3.5–5.1)
Sodium: 139 mmol/L (ref 135–145)
Total Bilirubin: 1.4 mg/dL — ABNORMAL HIGH (ref 0.3–1.2)
Total Protein: 6.1 g/dL — ABNORMAL LOW (ref 6.5–8.1)

## 2021-08-29 LAB — GLUCOSE, CAPILLARY
Glucose-Capillary: 94 mg/dL (ref 70–99)
Glucose-Capillary: 84 mg/dL (ref 70–99)
Glucose-Capillary: 122 mg/dL — ABNORMAL HIGH (ref 70–99)
Glucose-Capillary: 162 mg/dL — ABNORMAL HIGH (ref 70–99)
Glucose-Capillary: 109 mg/dL — ABNORMAL HIGH (ref 70–99)

## 2021-08-29 LAB — CBC WITH DIFFERENTIAL/PLATELET
Abs Immature Granulocytes: 0.07 10*3/uL (ref 0.00–0.07)
Basophils Absolute: 0.1 10*3/uL (ref 0.0–0.1)
Basophils Relative: 1 %
Eosinophils Absolute: 0.3 10*3/uL (ref 0.0–0.5)
Eosinophils Relative: 4 %
HCT: 28.4 % — ABNORMAL LOW (ref 39.0–52.0)
Hemoglobin: 9 g/dL — ABNORMAL LOW (ref 13.0–17.0)
Immature Granulocytes: 1 %
Lymphocytes Relative: 9 %
Lymphs Abs: 0.7 10*3/uL (ref 0.7–4.0)
MCH: 32.3 pg (ref 26.0–34.0)
MCHC: 31.7 g/dL (ref 30.0–36.0)
MCV: 101.8 fL — ABNORMAL HIGH (ref 80.0–100.0)
Monocytes Absolute: 0.4 10*3/uL (ref 0.1–1.0)
Monocytes Relative: 5 %
Neutro Abs: 5.9 10*3/uL (ref 1.7–7.7)
Neutrophils Relative %: 80 %
Platelets: 138 10*3/uL — ABNORMAL LOW (ref 150–400)
RBC: 2.79 MIL/uL — ABNORMAL LOW (ref 4.22–5.81)
RDW: 17.9 % — ABNORMAL HIGH (ref 11.5–15.5)
WBC: 7.3 10*3/uL (ref 4.0–10.5)
nRBC: 0 % (ref 0.0–0.2)

## 2021-08-29 LAB — POCT I-STAT, CHEM 8
BUN: 13 mg/dL (ref 8–23)
Calcium, Ion: 1.32 mmol/L (ref 1.15–1.40)
Chloride: 114 mmol/L — ABNORMAL HIGH (ref 98–111)
Creatinine, Ser: 1.2 mg/dL (ref 0.61–1.24)
Glucose, Bld: 102 mg/dL — ABNORMAL HIGH (ref 70–99)
HCT: 27 % — ABNORMAL LOW (ref 39.0–52.0)
Hemoglobin: 9.2 g/dL — ABNORMAL LOW (ref 13.0–17.0)
Potassium: 3.9 mmol/L (ref 3.5–5.1)
Sodium: 143 mmol/L (ref 135–145)
TCO2: 19 mmol/L — ABNORMAL LOW (ref 22–32)

## 2021-08-29 LAB — PROTIME-INR
INR: 1.2 (ref 0.8–1.2)
Prothrombin Time: 15.3 seconds — ABNORMAL HIGH (ref 11.4–15.2)

## 2021-08-29 SURGERY — COLONOSCOPY WITH PROPOFOL
Anesthesia: Monitor Anesthesia Care

## 2021-08-29 MED ORDER — ONDANSETRON HCL 4 MG/2ML IJ SOLN: 4.0000 mg | Freq: Once | INTRAMUSCULAR | Status: DC | PRN

## 2021-08-29 MED ORDER — SODIUM CHLORIDE 0.9 % IV SOLN: INTRAVENOUS | Status: DC

## 2021-08-29 MED ORDER — PROPOFOL 500 MG/50ML IV EMUL
INTRAVENOUS | Status: DC | PRN
  Administered 2021-08-29: 50 ug/kg/min via INTRAVENOUS

## 2021-08-29 MED ORDER — PROPOFOL 10 MG/ML IV BOLUS
INTRAVENOUS | Status: DC | PRN
  Administered 2021-08-29: 20 mg via INTRAVENOUS

## 2021-08-29 MED ORDER — EPHEDRINE SULFATE-NACL 50-0.9 MG/10ML-% IV SOSY
PREFILLED_SYRINGE | INTRAVENOUS | Status: DC | PRN
  Administered 2021-08-29 (×2): 5 mg via INTRAVENOUS

## 2021-08-29 MED ORDER — LIDOCAINE 2% (20 MG/ML) 5 ML SYRINGE
INTRAMUSCULAR | Status: DC | PRN
  Administered 2021-08-29: 40 mg via INTRAVENOUS

## 2021-08-29 MED ORDER — ALUM & MAG HYDROXIDE-SIMETH 200-200-20 MG/5ML PO SUSP
15.0000 mL | ORAL | Status: DC | PRN
  Administered 2021-08-29: 15 mL via ORAL
  Filled 2021-08-29: qty 30

## 2021-08-29 MED ORDER — ETOMIDATE 2 MG/ML IV SOLN
INTRAVENOUS | Status: DC | PRN
  Administered 2021-08-29: 6 mg via INTRAVENOUS

## 2021-08-29 MED ORDER — LACTATED RINGERS IV SOLN: INTRAVENOUS | Status: DC | PRN

## 2021-08-29 SURGICAL SUPPLY — 22 items

## 2021-08-29 NOTE — Op Note (Signed)
[facility] Patient Name: Aaron Johnston Procedure Date: 08/29/2021 MRN: [id number] Attending MD: Otis Brace , MD Date of Birth: 1946-02-27 CSN: [id number] Age: 75 Admit Type: Inpatient Procedure:                Colonoscopy Indications:              Last colonoscopy: date unknown (unable to locate                            last colonoscopy report), Rectal bleeding, Iron                            deficiency anemia Providers:                Otis Brace, MD, Jeanella Cara, RN,                            Faustina Mbumina, Technician Referring MD:              Medicines:                Sedation Administered by an Anesthesia Professional Complications:            No immediate complications. Estimated Blood Loss:     Estimated blood loss was minimal. Procedure:                Pre-Anesthesia Assessment:                           - Prior to the procedure, a History and Physical                            was performed, and patient medications and                            allergies were reviewed. The patient's tolerance of                            previous anesthesia was also reviewed. The risks                            and benefits of the procedure and the sedation                            options and risks were discussed with the patient.                            All questions were answered, and informed consent                            was obtained. Prior Anticoagulants: The patient has                            taken no previous anticoagulant or antiplatelet                            agents. ASA  Grade Assessment: IV - A patient with                            severe systemic disease that is a constant threat                            to life. After reviewing the risks and benefits,                            the patient was deemed in satisfactory condition to                            undergo the procedure.                           After  obtaining informed consent, the colonoscope                            was passed under direct vision. Throughout the                            procedure, the patient's blood pressure, pulse, and                            oxygen saturations were monitored continuously. The                            [id number]-HQ190L ([id number] Olympus colonoscope was                            introduced through the anus and advanced to the the                            terminal ileum, with identification of the                            appendiceal orifice and IC valve. The colonoscopy                            was performed without difficulty. The patient                            tolerated the procedure well. The quality of the                            bowel preparation was good except the sigmoid colon                            was fair. Scope In: 10:09:46 AM Scope Out: 10:23:49 AM Scope Withdrawal Time: 0 hours 7 minutes 59 seconds  Total Procedure Duration: 0 hours 14 minutes 3 seconds  Findings:      The perianal and digital rectal examinations were normal.      The terminal ileum appeared normal.  A diffuse area of mildly congested, erythematous, friable (with contact       bleeding) and inflamed mucosa was found in the entire colon. Biopsies       were taken with a cold forceps for histology.      Internal hemorrhoids were found during retroflexion. The hemorrhoids       were medium-sized. Impression:               - The examined portion of the ileum was normal.                           - Congested, erythematous, friable (with contact                            bleeding) and inflamed mucosa in the entire                            examined colon. Biopsied.                           - Internal hemorrhoids. Moderate Sedation:      Moderate (conscious) sedation was personally administered by an       anesthesia professional. The following parameters were monitored: oxygen        saturation, heart rate, blood pressure, and response to care. Recommendation:           - Return patient to hospital ward for ongoing care.                           - Resume regular diet.                           - Continue present medications.                           - Await pathology results.                           - No repeat colonoscopy. Procedure Code(s):        --- Professional ---                           [phone], Colonoscopy, flexible; with biopsy, single                            or multiple Diagnosis Code(s):        --- Professional ---                           K64.8, Other hemorrhoids                           K63.89, Other specified diseases of intestine                           K92.2, Gastrointestinal hemorrhage, unspecified                           K52.9, Noninfective  gastroenteritis and colitis,                            unspecified                           K62.5, Hemorrhage of anus and rectum                           D50.9, Iron deficiency anemia, unspecified CPT copyright 2019 [employer]. All rights reserved. The codes documented in this report are preliminary and upon coder review may  be revised to meet current compliance requirements. Otis Brace, MD Otis Brace, MD 08/29/2021 10:29:14 AM Number of Addenda: 0

## 2021-08-29 NOTE — Interval H&P Note (Signed)
History and Physical Interval Note:  08/29/2021 10:01 AM  Aaron Johnston  has presented today for surgery, with the diagnosis of Anemia, rectal bleed.  The various methods of treatment have been discussed with the patient and family. After consideration of risks, benefits and other options for treatment, the patient has consented to  Procedure(s): COLONOSCOPY WITH PROPOFOL (N/A) as a surgical intervention.  The patient's history has been reviewed, patient examined, no change in status, stable for surgery.  I have reviewed the patient's chart and labs.  Questions were answered to the patient's satisfaction.     Dalan Cowger

## 2021-08-29 NOTE — Brief Op Note (Signed)
08/27/2021 - 08/29/2021  10:30 AM  PATIENT:  Aaron Johnston  75 y.o. male  PRE-OPERATIVE DIAGNOSIS:  Anemia, rectal bleed  POST-OPERATIVE DIAGNOSIS:  abnormal colon mucosa- biopsied   PROCEDURE:  Procedure(s): COLONOSCOPY WITH PROPOFOL (N/A) BIOPSY  SURGEON:  Surgeon(s) and Role:    * Graylyn Bunney, MD - Primary  Findings ----------- -Colonoscopy showed diffuse edematous and inflamed mucosa throughout the colon which was friable with contact bleeding.  Biopsies taken.  It showed hemorrhoids.  no active bleeding seen during colonoscopy.  Recommendation ----------------------- -There was no active bleeding during colonoscopy today.  His intermittent rectal bleeding could be from diffuse mucosal bleeding, and unfortunately it is not easy to manage endoscopically. -Regular diet -Monitor H&H -Transfuse as needed -GI will sign off.  Call us back if needed  Otis Brace MD, [place] 08/29/2021, 10:32 AM  Contact #  [phone]

## 2021-08-29 NOTE — Anesthesia Preprocedure Evaluation (Signed)
Anesthesia Evaluation  Patient identified by MRN, date of birth, ID band Patient awake    Reviewed: Allergy & Precautions, NPO status , Patient's Chart, lab work & pertinent test results  Airway Mallampati: II  TM Distance: >3 FB Neck ROM: Full    Dental no notable dental hx.    Pulmonary neg pulmonary ROS,    Pulmonary exam normal breath sounds clear to auscultation       Cardiovascular negative cardio ROS Normal cardiovascular exam Rhythm:Regular Rate:Normal     Neuro/Psych negative neurological ROS  negative psych ROS   GI/Hepatic negative GI ROS, Metastatic pancreatic cancer   Endo/Other  diabetes, Type 2, Insulin Dependent  Renal/GU negative Renal ROS  negative genitourinary   Musculoskeletal negative musculoskeletal ROS (+)   Abdominal   Peds negative pediatric ROS (+)  Hematology negative hematology ROS (+) anemia ,   Anesthesia Other Findings   Reproductive/Obstetrics negative OB ROS                             Anesthesia Physical Anesthesia Plan  ASA: 4  Anesthesia Plan: MAC   Post-op Pain Management:    Induction: Intravenous  PONV Risk Score and Plan: 1 and Propofol infusion and Treatment may vary due to age or medical condition  Airway Management Planned: Simple Face Mask  Additional Equipment:   Intra-op Plan:   Post-operative Plan:   Informed Consent: I have reviewed the patients History and Physical, chart, labs and discussed the procedure including the risks, benefits and alternatives for the proposed anesthesia with the patient or authorized representative who has indicated his/her understanding and acceptance.     Dental advisory given  Plan Discussed with: CRNA and Surgeon  Anesthesia Plan Comments:         Anesthesia Quick Evaluation

## 2021-08-29 NOTE — Transfer of Care (Signed)
Immediate Anesthesia Transfer of Care Note  Patient: Aaron Johnston  Procedure(s) Performed: COLONOSCOPY WITH PROPOFOL BIOPSY  Patient Location: PACU  Anesthesia Type:MAC  Level of Consciousness: awake, alert , oriented and patient cooperative  Airway & Oxygen Therapy: Patient Spontanous Breathing and Patient connected to face mask oxygen  Post-op Assessment: Report given to RN and Post -op Vital signs reviewed and stable  Post vital signs: Reviewed and stable  Last Vitals:  Vitals Value Taken Time  BP 107/53 08/29/21 1058  Temp 36.4 C 08/29/21 1058  Pulse 64 08/29/21 1058  Resp 10 08/29/21 1309  SpO2 100 % 08/29/21 1058  Vitals shown include unvalidated device data.  Last Pain:  Vitals:   08/29/21 1058  TempSrc: Oral  PainSc:          Complications: No notable events documented.

## 2021-08-29 NOTE — Progress Notes (Addendum)
PROGRESS NOTE  Aaron Johnston ZOX:[id number] DOB: 07-Apr-1946 DOA: 08/27/2021 PCP: [name], [name], MD  HPI/Recap of past 24 hours: This is a 75 year old male with history of hypertension, pancreatic cancer on chemotherapy, hyperlipidemia, GERD chronic pain, who presented to the emergency department for evaluation of blood in his stool which started a day prior to admission he had noticed some dark blood and bright red blood.  In the ER he was noted to have a hemoglobin of 6.3 and his INR was greater than 10, he was given 10 mg of vitamin K and a goal GI was consulted.  Subjective: August 29, 2021: Seen and examined at bedside He was taken to the OR for colonoscopy this morning On seeing him later after his colonoscopy he stated that he still passing bloody stool he showed it to me in the commode  Assessment/Plan: Active Problems:   GI bleed  Acute GI bleed Patient presented with black stool and blood in stool as well as supratherapeutic INR in the setting of cancer with liver metastasis Patient underwent EGD August 17, 2012 2022 that showed first grade 1 esophageal varices nonbleeding gastric varices Continue work-up by GI  Acute on chronic blood loss anemia Due to chronic GI loss related to dialysis His presenting hemoglobin was 6.6 Status post 2 units packed RBC Current hemoglobin is 9.2  Pancreatic cancer with liver mets Chronic pain Oncology is on board.  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  Supratherapeutic INR His presenting INR was 10 He received vitamin K INR is 1.2 today      Code Status: Full  Severity of Illness: The appropriate patient status for this patient is INPATIENT. Inpatient status is judged to be reasonable and necessary in order to provide the required intensity of service  to ensure the patient's safety. The patient's presenting symptoms, physical exam findings, and initial radiographic and laboratory data in the context of their chronic comorbidities is felt to place them at high risk for further clinical deterioration. Furthermore, it is not anticipated that the patient will be medically stable for discharge from the hospital within 2 midnights of admission. GI bleed  * I certify that at the point of admission it is my clinical judgment that the patient will require inpatient hospital care spanning beyond 2 midnights from the point of admission due to high intensity of service, high risk for further deterioration and high frequency of surveillance required.*   Family Communication: Patient  Disposition Plan: Home Status is: Inpatient   Dispo: The patient is from: Home              Anticipated d/c is to:               Anticipated d/c date is:               Patient currently not medically stable for discharge  Consultants: GI Oncology  Procedures: Colonoscopy August 29, 2021 EGD August 27, 2021  Antimicrobials: None Unasyn stopped August 27, 2021  DVT prophylaxis: SCD   Objective: Vitals:   08/28/21 0543 08/28/21 1220 08/28/21 2113 08/29/21 0943  BP: (!) 106/56 (!) 97/52 116/71 (!) 125/58  Pulse: 73 (!) 58 (!) 58 (!) 59  Resp: 16 16 20 18   Temp: 99.2 F (37.3 C) 97.8 F (36.6 C) 97.7 F (36.5 C) 98.4 F (36.9 C)  TempSrc: Oral  Oral  Oral  SpO2: 100% 100% 100% 96%  Weight:    59.4 kg  Height:    5\' 8"  (1.727 m)    Intake/Output Summary (Last 24 hours) at 08/29/2021 0956 Last data filed at 08/28/2021 1100 Gross per 24 hour  Intake 240 ml  Output --  Net 240 ml   Filed Weights   08/27/21 0552 08/27/21 1318 08/29/21 0943  Weight: 60.4 kg 60.4 kg 59.4 kg   Body mass index is 19.92 kg/m.  Exam:  General: 75 y.o. year-old male well developed well nourished in no acute distress.  Alert and oriented x3. Cardiovascular: Regular  rate and rhythm with no rubs or gallops.  No thyromegaly or JVD noted.   Respiratory: Clear to auscultation with no wheezes or rales. Good inspiratory effort. Abdomen: Soft nontender nondistended with normal bowel sounds x4 quadrants. Musculoskeletal: No lower extremity edema. 2/4 pulses in all 4 extremities. Skin: No ulcerative lesions noted or rashes, Psychiatry: Mood is appropriate for condition and setting Neurology:    Data Reviewed: CBC: Recent Labs  Lab 08/27/21 0251 08/27/21 0619 08/27/21 0926 08/27/21 1620 08/27/21 2304 08/28/21 0407  WBC 8.7 8.4  --   --   --  8.4  NEUTROABS 7.1  --   --   --   --   --   HGB 6.3* 6.6* 6.5* 9.3* 8.5* 7.3*  HCT 19.7* 21.7* 20.8* 29.8* 26.5* 23.0*  MCV 101.5* 108.5*  --   --   --  101.8*  PLT 172 169  --   --   --  734*   Basic Metabolic Panel: Recent Labs  Lab 08/27/21 0251 08/27/21 0619 08/27/21 0926 08/28/21 0407  NA 140 142 139 140  K 4.0 4.7 4.4 3.8  CL 117* 119* 115* 116*  CO2 16* 18* 18* 18*  GLUCOSE 165* 127* 95 100*  BUN 20 22 22 18   CREATININE 0.92 1.16 1.10 1.16  CALCIUM 7.2* 8.6* 8.4* 8.2*   GFR: Estimated Creatinine Clearance: 47.7 mL/min (by C-G formula based on SCr of 1.16 mg/dL). Liver Function Tests: Recent Labs  Lab 08/27/21 0251 08/27/21 0926 08/28/21 0407  AST 39 39 33  ALT 45* 50* 45*  ALKPHOS 234* 236* 224*  BILITOT 0.5 0.6 0.8  PROT 5.4* 5.7* 5.7*  ALBUMIN 3.2* 3.3* 3.0*   No results for input(s): LIPASE, AMYLASE in the last 168 hours. No results for input(s): AMMONIA in the last 168 hours. Coagulation Profile: Recent Labs  Lab 08/27/21 0251 08/27/21 0926  INR >10.0* 1.2   Cardiac Enzymes: No results for input(s): CKTOTAL, CKMB, CKMBINDEX, TROPONINI in the last 168 hours. BNP (last 3 results) No results for input(s): PROBNP in the last 8760 hours. HbA1C: Recent Labs    08/27/21 0622  HGBA1C 6.1*   CBG: Recent Labs  Lab 08/28/21 0737 08/28/21 1217 08/28/21 1718  08/28/21 2110 08/29/21 0801  GLUCAP 98 177* 89 144* 94   Lipid Profile: No results for input(s): CHOL, HDL, LDLCALC, TRIG, CHOLHDL, LDLDIRECT in the last 72 hours. Thyroid Function Tests: No results for input(s): TSH, T4TOTAL, FREET4, T3FREE, THYROIDAB in the last 72 hours. Anemia Panel: Recent Labs    08/27/21 0622 08/27/21 0926  VITAMINB12  --  1,947*  FOLATE 14.0  --   FERRITIN  --  321  TIBC  --  241*  IRON  --  36*  RETICCTPCT 1.7  --    Urine analysis:    Component Value Date/Time   COLORURINE YELLOW 10/22/2020 1529  APPEARANCEUR CLEAR 10/22/2020 1529   LABSPEC 1.014 10/22/2020 1529   PHURINE 5.0 10/22/2020 1529   GLUCOSEU 50 (A) 10/22/2020 1529   HGBUR SMALL (A) 10/22/2020 1529   BILIRUBINUR NEGATIVE 10/22/2020 1529   KETONESUR NEGATIVE 10/22/2020 1529   PROTEINUR NEGATIVE 10/22/2020 1529   NITRITE NEGATIVE 10/22/2020 1529   LEUKOCYTESUR NEGATIVE 10/22/2020 1529   Sepsis Labs: @LABRCNTIP (procalcitonin:4,lacticidven:4)  ) Recent Results (from the past 240 hour(s))  Resp Panel by RT-PCR (Flu A&B, Covid) Nasopharyngeal Swab     Status: None   Collection Time: 08/27/21  2:40 AM   Specimen: Nasopharyngeal Swab; Nasopharyngeal(NP) swabs in vial transport medium  Result Value Ref Range Status   SARS Coronavirus 2 by RT PCR NEGATIVE NEGATIVE Final    Comment: (NOTE) SARS-CoV-2 target nucleic acids are NOT DETECTED.  The SARS-CoV-2 RNA is generally detectable in upper respiratory specimens during the acute phase of infection. The lowest concentration of SARS-CoV-2 viral copies this assay can detect is 138 copies/mL. A negative result does not preclude SARS-Cov-2 infection and should not be used as the sole basis for treatment or other patient management decisions. A negative result may occur with  improper specimen collection/handling, submission of specimen other than nasopharyngeal swab, presence of viral mutation(s) within the areas targeted by this assay,  and inadequate number of viral copies(<138 copies/mL). A negative result must be combined with clinical observations, patient history, and epidemiological information. The expected result is Negative.  Fact Sheet for Patients:  EntrepreneurPulse.com.au  Fact Sheet for Healthcare Providers:  IncredibleEmployment.be  This test is no t yet approved or cleared by the [place] FDA and  has been authorized for detection and/or diagnosis of SARS-CoV-2 by FDA under an Emergency Use Authorization (EUA). This EUA will remain  in effect (meaning this test can be used) for the duration of the COVID-19 declaration under Section 564(b)(1) of the Act, 21 U.S.C.section 360bbb-3(b)(1), unless the authorization is terminated  or revoked sooner.       Influenza A by PCR NEGATIVE NEGATIVE Final   Influenza B by PCR NEGATIVE NEGATIVE Final    Comment: (NOTE) The Xpert Xpress SARS-CoV-2/FLU/RSV plus assay is intended as an aid in the diagnosis of influenza from Nasopharyngeal swab specimens and should not be used as a sole basis for treatment. Nasal washings and aspirates are unacceptable for Xpert Xpress SARS-CoV-2/FLU/RSV testing.  Fact Sheet for Patients: EntrepreneurPulse.com.au  Fact Sheet for Healthcare Providers: IncredibleEmployment.be  This test is not yet approved or cleared by the [place] FDA and has been authorized for detection and/or diagnosis of SARS-CoV-2 by FDA under an Emergency Use Authorization (EUA). This EUA will remain in effect (meaning this test can be used) for the duration of the COVID-19 declaration under Section 564(b)(1) of the Act, 21 U.S.C. section 360bbb-3(b)(1), unless the authorization is terminated or revoked.  Performed at [facility], [address] [place], [place] [address]       Studies: No results found.  Scheduled Meds:  [MAR Hold] (feeding  supplement) PROSource Plus  30 mL Oral BID BM   [MAR Hold] Chlorhexidine Gluconate Cloth  6 each Topical Daily   [MAR Hold] feeding supplement  1 Container Oral BID BM   [MAR Hold] insulin aspart  0-6 Units Subcutaneous TID WC   [MAR Hold] latanoprost  1 drop Both Eyes QHS   [MAR Hold] lipase/protease/amylase  72,000 Units Oral TID WC   [MAR Hold] multivitamin with minerals  1 tablet Oral Daily   [MAR Hold] pantoprazole  40 mg Oral Daily   [MAR Hold] propranolol  20 mg Oral BID   [MAR Hold] sodium chloride flush  10-40 mL Intracatheter Q12H    Continuous Infusions:  sodium chloride 20 mL/hr at 08/28/21 0608   sodium chloride 20 mL/hr at 08/29/21 0843   lactated ringers 0 mL (08/27/21 1800)     LOS: 2 days     Cristal Deer, MD [facility]  To reach me or the doctor on call, go to: [email or web address] Password [email or web address]  08/29/2021, 9:56 AM

## 2021-08-30 DIAGNOSIS — K921 Melena: Principal | ICD-10-CM

## 2021-08-30 LAB — GLUCOSE, CAPILLARY
Glucose-Capillary: 156 mg/dL — ABNORMAL HIGH (ref 70–99)
Glucose-Capillary: 168 mg/dL — ABNORMAL HIGH (ref 70–99)
Glucose-Capillary: 179 mg/dL — ABNORMAL HIGH (ref 70–99)
Glucose-Capillary: 147 mg/dL — ABNORMAL HIGH (ref 70–99)

## 2021-08-30 LAB — CBC
HCT: 28.8 % — ABNORMAL LOW (ref 39.0–52.0)
Hemoglobin: 8.9 g/dL — ABNORMAL LOW (ref 13.0–17.0)
MCH: 31.8 pg (ref 26.0–34.0)
MCHC: 30.9 g/dL (ref 30.0–36.0)
MCV: 102.9 fL — ABNORMAL HIGH (ref 80.0–100.0)
Platelets: 145 10*3/uL — ABNORMAL LOW (ref 150–400)
RBC: 2.8 MIL/uL — ABNORMAL LOW (ref 4.22–5.81)
RDW: 17.9 % — ABNORMAL HIGH (ref 11.5–15.5)
WBC: 9.3 10*3/uL (ref 4.0–10.5)
nRBC: 0 % (ref 0.0–0.2)

## 2021-08-30 NOTE — Progress Notes (Addendum)
PROGRESS NOTE  Aaron Johnston TIW:[id number] DOB: 12-14-1945 DOA: 08/27/2021 PCP: [name], [name], MD  HPI/Recap of past 24 hours: This is a 75 year old male with history of hypertension, pancreatic cancer on chemotherapy, hyperlipidemia, GERD chronic pain, who presented to the emergency department for evaluation of blood in his stool which started a day prior to admission he had noticed some dark blood and bright red blood.  In the ER he was noted to have a hemoglobin of 6.3 and his INR was greater than 10, he was given 10 mg of vitamin K and a goal GI was consulted.  Subjective: August 29, 2021: Seen and examined at bedside He was taken to the OR for colonoscopy this morning On seeing him later after his colonoscopy he stated that he still passing bloody stool he showed it to me in the commode  August 30, 2021: Patient seen and examined at bedside: He was sleeping soundly he denies any complaints Still has a little bit of blood in the commode his wife is at bedside  Assessment/Plan: Active Problems:   GI bleed   Protein-calorie malnutrition, severe  Acute GI bleed Patient presented with black stool and blood in stool as well as supratherapeutic INR in the setting of cancer with liver metastasis Patient underwent EGD August 17, 2012 2022 that showed first grade 1 esophageal varices nonbleeding gastric varices Continue work-up by GI  Acute on chronic blood loss anemia Due to chronic GI loss related to dialysis His presenting hemoglobin was 6.6 Status post 2 units packed RBC Current hemoglobin is 8.9 it was 9.2   Pancreatic cancer with liver mets Chronic pain Oncology is on board.  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  Supratherapeutic INR His presenting INR was 10 He received vitamin K INR is 1.2  today      Code Status: Full  Severity of Illness: The appropriate patient status for this patient is INPATIENT. Inpatient status is judged to be reasonable and necessary in order to provide the required intensity of service to ensure the patient's safety. The patient's presenting symptoms, physical exam findings, and initial radiographic and laboratory data in the context of their chronic comorbidities is felt to place them at high risk for further clinical deterioration. Furthermore, it is not anticipated that the patient will be medically stable for discharge from the hospital within 2 midnights of admission. GI bleed  * I certify that at the point of admission it is my clinical judgment that the patient will require inpatient hospital care spanning beyond 2 midnights from the point of admission due to high intensity of service, high risk for further deterioration and high frequency of surveillance required.*   Family Communication: Wife Mariann Laster at bedside  Disposition Plan: Home Status is: Inpatient   Dispo: The patient is from: Home              Anticipated d/c is to:               Anticipated d/c date is:               Patient currently not medically stable for discharge  Consultants: GI Oncology  Procedures: Colonoscopy August 29, 2021 EGD August 27, 2021  Antimicrobials: None Unasyn stopped August 27, 2021  DVT prophylaxis: SCD   Objective: Vitals:   08/29/21 2033 08/30/21 0458 08/30/21 1012 08/30/21 1131  BP: (!) 101/56 130/77 100/61 112/70  Pulse: 77 74 69 66  Resp: 19 20  17   Temp: 98.1 F (36.7 C) 98.6 F (37 C)  97.6 F (36.4 C)  TempSrc: Oral   Oral  SpO2: 100% 100%  100%  Weight:      Height:        Intake/Output Summary (Last 24 hours) at 08/30/2021 1227 Last data filed at 08/29/2021 1824 Gross per 24 hour  Intake 680 ml  Output 150 ml  Net 530 ml    Filed Weights   08/27/21 0552 08/27/21 1318 08/29/21 0943  Weight: 60.4 kg 60.4 kg  59.4 kg   Body mass index is 19.92 kg/m.  Exam:  General: 75 y.o. year-old male well developed well nourished in no acute distress.  Alert and oriented x3. Cardiovascular: Regular rate and rhythm with no rubs or gallops.  No thyromegaly or JVD noted.   Respiratory: Clear to auscultation with no wheezes or rales. Good inspiratory effort. Abdomen: Soft nontender nondistended with normal bowel sounds x4 quadrants. Musculoskeletal: No lower extremity edema. 2/4 pulses in all 4 extremities. Skin: No ulcerative lesions noted or rashes, Psychiatry: Mood is appropriate for condition and setting Neurology:    Data Reviewed: CBC: Recent Labs  Lab 08/27/21 0251 08/27/21 0619 08/27/21 0926 08/27/21 2304 08/28/21 0407 08/29/21 0654 08/29/21 0952 08/30/21 0521  WBC 8.7 8.4  --   --  8.4 7.3  --  9.3  NEUTROABS 7.1  --   --   --   --  5.9  --   --   HGB 6.3* 6.6*   < > 8.5* 7.3* 9.0* 9.2* 8.9*  HCT 19.7* 21.7*   < > 26.5* 23.0* 28.4* 27.0* 28.8*  MCV 101.5* 108.5*  --   --  101.8* 101.8*  --  102.9*  PLT 172 169  --   --  132* 138*  --  145*   < > = values in this interval not displayed.    Basic Metabolic Panel: Recent Labs  Lab 08/27/21 0251 08/27/21 0619 08/27/21 0926 08/28/21 0407 08/29/21 0654 08/29/21 0952  NA 140 142 139 140 139 143  K 4.0 4.7 4.4 3.8 3.9 3.9  CL 117* 119* 115* 116* 114* 114*  CO2 16* 18* 18* 18* 19*  --   GLUCOSE 165* 127* 95 100* 106* 102*  BUN 20 22 22 18 15 13   CREATININE 0.92 1.16 1.10 1.16 1.04 1.20  CALCIUM 7.2* 8.6* 8.4* 8.2* 8.9  --     GFR: Estimated Creatinine Clearance: 46.1 mL/min (by C-G formula based on SCr of 1.2 mg/dL). Liver Function Tests: Recent Labs  Lab 08/27/21 0251 08/27/21 0926 08/28/21 0407 08/29/21 0654  AST 39 39 33 27  ALT 45* 50* 45* 41  ALKPHOS 234* 236* 224* 222*  BILITOT 0.5 0.6 0.8 1.4*  PROT 5.4* 5.7* 5.7* 6.1*  ALBUMIN 3.2* 3.3* 3.0* 3.3*    No results for input(s): LIPASE, AMYLASE in the last 168  hours. No results for input(s): AMMONIA in the last 168 hours. Coagulation Profile: Recent Labs  Lab 08/27/21 0251 08/27/21 0926 08/29/21 0654  INR >10.0* 1.2 1.2    Cardiac Enzymes: No results for input(s): CKTOTAL, CKMB, CKMBINDEX, TROPONINI in the last 168 hours. BNP (last 3 results) No results for input(s): PROBNP in the last 8760 hours. HbA1C: No results for input(s): HGBA1C in the last 72 hours.  CBG: Recent Labs  Lab 08/29/21 1145 08/29/21 1617 08/29/21 2106 08/30/21 0724 08/30/21  [place]    Lipid Profile: No results for input(s): CHOL, HDL, LDLCALC, TRIG, CHOLHDL, LDLDIRECT in the last 72 hours. Thyroid Function Tests: No results for input(s): TSH, T4TOTAL, FREET4, T3FREE, THYROIDAB in the last 72 hours. Anemia Panel: No results for input(s): VITAMINB12, FOLATE, FERRITIN, TIBC, IRON, RETICCTPCT in the last 72 hours.  Urine analysis:    Component Value Date/Time   COLORURINE YELLOW 10/22/2020 1529   APPEARANCEUR CLEAR 10/22/2020 1529   LABSPEC 1.014 10/22/2020 1529   PHURINE 5.0 10/22/2020 1529   GLUCOSEU 50 (A) 10/22/2020 1529   HGBUR SMALL (A) 10/22/2020 1529   BILIRUBINUR NEGATIVE 10/22/2020 1529   KETONESUR NEGATIVE 10/22/2020 1529   PROTEINUR NEGATIVE 10/22/2020 1529   NITRITE NEGATIVE 10/22/2020 1529   LEUKOCYTESUR NEGATIVE 10/22/2020 1529   Sepsis Labs: @LABRCNTIP (procalcitonin:4,lacticidven:4)  ) Recent Results (from the past 240 hour(s))  Resp Panel by RT-PCR (Flu A&B, Covid) Nasopharyngeal Swab     Status: None   Collection Time: 08/27/21  2:40 AM   Specimen: Nasopharyngeal Swab; Nasopharyngeal(NP) swabs in vial transport medium  Result Value Ref Range Status   SARS Coronavirus 2 by RT PCR NEGATIVE NEGATIVE Final    Comment: (NOTE) SARS-CoV-2 target nucleic acids are NOT DETECTED.  The SARS-CoV-2 RNA is generally detectable in upper respiratory specimens during the acute phase of infection. The  lowest concentration of SARS-CoV-2 viral copies this assay can detect is 138 copies/mL. A negative result does not preclude SARS-Cov-2 infection and should not be used as the sole basis for treatment or other patient management decisions. A negative result may occur with  improper specimen collection/handling, submission of specimen other than nasopharyngeal swab, presence of viral mutation(s) within the areas targeted by this assay, and inadequate number of viral copies(<138 copies/mL). A negative result must be combined with clinical observations, patient history, and epidemiological information. The expected result is Negative.  Fact Sheet for Patients:  EntrepreneurPulse.com.au  Fact Sheet for Healthcare Providers:  IncredibleEmployment.be  This test is no t yet approved or cleared by the [place] FDA and  has been authorized for detection and/or diagnosis of SARS-CoV-2 by FDA under an Emergency Use Authorization (EUA). This EUA will remain  in effect (meaning this test can be used) for the duration of the COVID-19 declaration under Section 564(b)(1) of the Act, 21 U.S.C.section 360bbb-3(b)(1), unless the authorization is terminated  or revoked sooner.       Influenza A by PCR NEGATIVE NEGATIVE Final   Influenza B by PCR NEGATIVE NEGATIVE Final    Comment: (NOTE) The Xpert Xpress SARS-CoV-2/FLU/RSV plus assay is intended as an aid in the diagnosis of influenza from Nasopharyngeal swab specimens and should not be used as a sole basis for treatment. Nasal washings and aspirates are unacceptable for Xpert Xpress SARS-CoV-2/FLU/RSV testing.  Fact Sheet for Patients: EntrepreneurPulse.com.au  Fact Sheet for Healthcare Providers: IncredibleEmployment.be  This test is not yet approved or cleared by the [place] FDA and has been authorized for detection and/or diagnosis of SARS-CoV-2 by FDA under  an Emergency Use Authorization (EUA). This EUA will remain in effect (meaning this test can be used) for the duration of the COVID-19 declaration under Section 564(b)(1) of the Act, 21 U.S.C. section 360bbb-3(b)(1), unless the authorization is terminated or revoked.  Performed at [facility], [address] [place], [place] [address]       Studies: No results found.  Scheduled Meds:  (feeding supplement) PROSource Plus  30 mL Oral  BID BM   Chlorhexidine Gluconate Cloth  6 each Topical Daily   feeding supplement  1 Container Oral BID BM   insulin aspart  0-6 Units Subcutaneous TID WC   latanoprost  1 drop Both Eyes QHS   lipase/protease/amylase  72,000 Units Oral TID WC   multivitamin with minerals  1 tablet Oral Daily   pantoprazole  40 mg Oral Daily   propranolol  20 mg Oral BID   sodium chloride flush  10-40 mL Intracatheter Q12H    Continuous Infusions:  lactated ringers Stopped (08/27/21 1800)     LOS: 3 days     Cristal Deer, MD [facility]  To reach me or the doctor on call, go to: [email or web address] Password [facility]  08/30/2021, 12:27 PM

## 2021-08-30 NOTE — Progress Notes (Signed)
End of shift:]  Patient resting comfortably in bedside chair and is alert and oriented x 4.  Patient had an uneventful day. No acute changes. No concerns at this time.

## 2021-08-31 ENCOUNTER — Encounter (HOSPITAL_COMMUNITY): Payer: Self-pay | Admitting: Gastroenterology

## 2021-08-31 DIAGNOSIS — I10 Essential (primary) hypertension: Secondary | ICD-10-CM | POA: Diagnosis not present

## 2021-08-31 DIAGNOSIS — D696 Thrombocytopenia, unspecified: Secondary | ICD-10-CM | POA: Diagnosis not present

## 2021-08-31 DIAGNOSIS — R109 Unspecified abdominal pain: Secondary | ICD-10-CM

## 2021-08-31 DIAGNOSIS — E43 Unspecified severe protein-calorie malnutrition: Secondary | ICD-10-CM

## 2021-08-31 DIAGNOSIS — E119 Type 2 diabetes mellitus without complications: Secondary | ICD-10-CM | POA: Diagnosis not present

## 2021-08-31 DIAGNOSIS — C250 Malignant neoplasm of head of pancreas: Secondary | ICD-10-CM | POA: Diagnosis not present

## 2021-08-31 DIAGNOSIS — K922 Gastrointestinal hemorrhage, unspecified: Secondary | ICD-10-CM

## 2021-08-31 DIAGNOSIS — K921 Melena: Secondary | ICD-10-CM | POA: Diagnosis not present

## 2021-08-31 DIAGNOSIS — M549 Dorsalgia, unspecified: Secondary | ICD-10-CM

## 2021-08-31 LAB — BASIC METABOLIC PANEL
Anion gap: 6 (ref 5–15)
BUN: 22 mg/dL (ref 8–23)
CO2: 19 mmol/L — ABNORMAL LOW (ref 22–32)
Calcium: 8.5 mg/dL — ABNORMAL LOW (ref 8.9–10.3)
Chloride: 115 mmol/L — ABNORMAL HIGH (ref 98–111)
Creatinine, Ser: 1.17 mg/dL (ref 0.61–1.24)
GFR, Estimated: >60 mL/min (ref >60)
Glucose, Bld: 181 mg/dL — ABNORMAL HIGH (ref 70–99)
Potassium: 3.5 mmol/L (ref 3.5–5.1)
Sodium: 140 mmol/L (ref 135–145)

## 2021-08-31 LAB — GLUCOSE, CAPILLARY
Glucose-Capillary: 109 mg/dL — ABNORMAL HIGH (ref 70–99)
Glucose-Capillary: 161 mg/dL — ABNORMAL HIGH (ref 70–99)
Glucose-Capillary: 142 mg/dL — ABNORMAL HIGH (ref 70–99)
Glucose-Capillary: 151 mg/dL — ABNORMAL HIGH (ref 70–99)

## 2021-08-31 LAB — CBC
HCT: 27.8 % — ABNORMAL LOW (ref 39.0–52.0)
Hemoglobin: 8.8 g/dL — ABNORMAL LOW (ref 13.0–17.0)
MCH: 32.5 pg (ref 26.0–34.0)
MCHC: 31.7 g/dL (ref 30.0–36.0)
MCV: 102.6 fL — ABNORMAL HIGH (ref 80.0–100.0)
Platelets: 123 10*3/uL — ABNORMAL LOW (ref 150–400)
RBC: 2.71 MIL/uL — ABNORMAL LOW (ref 4.22–5.81)
RDW: 17.2 % — ABNORMAL HIGH (ref 11.5–15.5)
WBC: 8 10*3/uL (ref 4.0–10.5)
nRBC: 0 % (ref 0.0–0.2)

## 2021-08-31 NOTE — Plan of Care (Deleted)

## 2021-08-31 NOTE — Plan of Care (Signed)

## 2021-08-31 NOTE — Progress Notes (Addendum)
HEMATOLOGY-ONCOLOGY PROGRESS NOTE  SUBJECTIVE: Mr. Pantaleo is followed by our office for pancreatic cancer.  Most recently, he has been receiving gemcitabine/Abraxane.  Recent MRI showed evidence for disease progression.  Plan was to see him for outpatient follow-up in our office later this week to discuss salvage chemotherapy.  He has now been admitted for GI bleed with a supratherapeutic INR.  Colonoscopy was performed on 08/29/2021 which showed a diffuse area of mildly congested, erythematous, friable and inflamed mucosa in the entire colon.  Biopsies were taken and are currently pending.  This morning, the patient reports that he is feeling better.  His wife tells me that he is still having some dark stools but no obvious bleeding.  She reports that he vomited 1 time last night but otherwise has been tolerating a regular diet well.  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[place])   07/28/2020 - 02/23/2021 Chemotherapy          03/12/2021 -  Chemotherapy   Patient is on Treatment Plan : PANCREATIC Abraxane / Gemcitabine D1,8,15 q28d      PHYSICAL EXAMINATION:  Vitals:   08/31/21 0547 08/31/21 1342  BP: 118/67 107/66  Pulse: 73 70  Resp: 18 16  Temp: 98.6 F (37 C) 98.6 F (37 C)  SpO2: 99% 100%   Filed Weights   08/27/21 0552 08/27/21 1318 08/29/21 0943  Weight: 60.4 kg 60.4 kg 59.4 kg    Intake/Output from previous day: 10/16 0701 - 10/17 0700 In: 840 [P.O.:840] Out: -   GENERAL:alert, no distress and comfortable OROPHARYNX:no exudate, no erythema and lips, buccal mucosa, and tongue normal  LUNGS: clear to auscultation and percussion with normal breathing effort HEART: regular rate & rhythm and no murmurs ABDOMEN:abdomen soft, non-tender and normal bowel sounds NEURO: alert & oriented x 3 with fluent speech, no focal motor/sensory deficits  LABORATORY DATA:  I have reviewed the data as  listed CMP Latest Ref Rng & Units 08/31/2021 08/29/2021 08/29/2021  Glucose 70 - 99 mg/dL 181(H) 102(H) 106(H)  BUN 8 - 23 mg/dL _0 Creatinine 0.61 - 1.24 mg/dL 1.17 1.20 1.04  Sodium 135 - 145 mmol/L 140 143 139  Potassium 3.5 - 5.1 mmol/L 3.5 3.9 3.9  Chloride 98 - 111 mmol/L 115(H) 114(H) 114(H)  CO2 22 - 32 mmol/L 19(L) - 19(L)  Calcium 8.9 - 10.3 mg/dL 8.5(L) - 8.9  Total Protein 6.5 - 8.1 g/dL - - 6.1(L)  Total Bilirubin 0.3 - 1.2 mg/dL - - 1.4(H)  Alkaline Phos 38 - 126 U/L - - 222(H)  AST 15 - 41 U/L - - 27  ALT 0 - 44 U/L - - 41    Lab Results  Component Value Date   WBC 8.0 08/31/2021   HGB 8.8 (L) 08/31/2021   HCT 27.8 (L) 08/31/2021   MCV 102.6 (H) 08/31/2021   PLT 123 (L) 08/31/2021   NEUTROABS 5.9 08/29/2021    CT Chest Wo Contrast  Result Date: 08/27/2021 CLINICAL DATA:  Pancreatic cancer, staging. EXAM: CT CHEST WITHOUT CONTRAST TECHNIQUE: Multidetector CT imaging of the chest was performed following the standard protocol without IV contrast. COMPARISON:  An abdominal CT of 11/06/2020.  No correlate chest CT. FINDINGS: Cardiovascular: Left Port-A-Cath tip at mid right atrium. Aortic atherosclerosis. Normal heart size, without pericardial effusion. Left main and 3 vessel coronary artery calcification. Mediastinum/Nodes: No mediastinal or definite hilar adenopathy, given  limitations of unenhanced CT. Mild distal esophageal wall thickening. Lungs/Pleura: No pleural fluid. Diffuse, peripheral predominant areas of nodularity and nodular consolidation. Compared to 11/06/2020 imaged lung bases, this is increased. An index left upper lobe pleural based nodular consolidation at 1.8 cm anteriorly on 65/4. Pleural-based right lower lobe 6 mm pulmonary nodule on 79/4. Upper Abdomen: Caudate lobe enlargement. Progressive hepatic metastasis including in segment 4 B at 2.0 cm on 138/2 (compared to 11/06/2020). Normal imaged portions of the spleen, adrenal glands, left kidney.  Upper pole right renal cyst. Pancreatic primary is poorly evaluated. Musculoskeletal: Sclerotic lesion within the T2 vertebral body at 1.2 cm on 91/6. T7 mild to moderate compression deformity without ventral canal encroachment. IMPRESSION: 1. Since the abdominal CT of 11/06/2020 (no prior chest CTs for comparison) progression of diffuse pulmonary nodularity and nodular consolidation which is favored to represent metastatic disease. Given distribution and morphology, an atypical infectious process is possible but felt less likely. 2. Hepatic metastasis, better evaluated on recent abdominal MRI. 3. No thoracic adenopathy. 4. Sclerotic lesion in the T3 vertebral body, suspicious for osseous metastasis. 5. T7 compression deformity. 6. Distal esophageal wall thickening, suspicious for esophagitis. 7. Coronary artery atherosclerosis. Aortic Atherosclerosis (ICD10-I70.0). Electronically Signed   By: Abigail Miyamoto M.D.   On: 08/27/2021 11:44   MR LIVER W WO CONTRAST  Result Date: 08/20/2021 CLINICAL DATA:  A 75 year old male presents with pancreatic cancer with metastatic disease for follow-up. EXAM: MRI ABDOMEN WITHOUT AND WITH CONTRAST TECHNIQUE: Multiplanar multisequence MR imaging of the abdomen was performed both before and after the administration of intravenous contrast. CONTRAST:  34m GADAVIST GADOBUTROL 1 MMOL/ML IV SOLN COMPARISON:  Comparison made with multiple prior imaging, most recent comparison imaging from May 26, 2021. FINDINGS: Lower chest: Multifocal nodularity is suspected in the lower chest. Discrete nodule along the pleural surface in the RIGHT lower lobe for instance (image 1/9) this was not present previously. On coronal images either airspace disease or pulmonary nodule (image 24/7) 20 x 13 mm as measured in the coronal plane. Adjacent nodularity makes this suspicious for metastatic process. There is no effusion in the lung bases. Multifocal nodularity suggested bilaterally at the lung bases on  contrasted images, image 1-22 of series 25. Hepatobiliary: Attenuated/narrowed but nondilated biliary tree. Cholelithiasis without pericholecystic stranding. Sludge in the gallbladder. Similar appearance of pancreatic metastatic lesions in the liver as compared to previous imaging with respect to distribution. Enlarging lesion in hepatic subsegment VII/VIII (image 19/8 this measures 16 mm previously 5 mm. Other small lesions with similar appearance. 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 Occlusion of splenic vein and SMV as well with numerous upper abdominal collateral pathways. Pancreas: Ill-defined mass in the region of the pancreatic neck and body with encasement of the celiac, SMA and portal venous structures including SMV and splenic vein as described. Spleen:  Enlargement of the spleen now 16 cm. Adrenals/Urinary Tract: Adrenal glands are normal. Kidneys enhance symmetrically with renal cyst on the RIGHT. 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 No acute abdominal process to the extent evaluated on abdominal MRI not performed for bowel evaluation. Vascular involvement Vascular/Lymphatic:  From pancreatic neoplasm. Other:  No ascites Musculoskeletal: No suspicious bone lesions identified. IMPRESSION: Locally advanced and metastatic pancreatic cancer with signs of worsening of dominant lesions in the liver and with suspected pulmonary metastatic disease. Consider chest CT for further evaluation. Increasing size of the spleen, attention on follow-up now approximately 16 cm as compared to 15  cm greatest craniocaudal dimension. Gastric varices. Cholelithiasis and sludge with attenuation of the common bile duct but without central dilation, attention on follow-up. Electronically Signed   By: Zetta Bills M.D.   On: 08/20/2021 10:19   MR 3D Recon At Scanner  Result Date: 08/20/2021 CLINICAL DATA:  A 75 year old male presents with pancreatic  cancer with metastatic disease for follow-up. EXAM: MRI ABDOMEN WITHOUT AND WITH CONTRAST TECHNIQUE: Multiplanar multisequence MR imaging of the abdomen was performed both before and after the administration of intravenous contrast. CONTRAST:  77m GADAVIST GADOBUTROL 1 MMOL/ML IV SOLN COMPARISON:  Comparison made with multiple prior imaging, most recent comparison imaging from May 26, 2021. FINDINGS: Lower chest: Multifocal nodularity is suspected in the lower chest. Discrete nodule along the pleural surface in the RIGHT lower lobe for instance (image 1/9) this was not present previously. On coronal images either airspace disease or pulmonary nodule (image 24/7) 20 x 13 mm as measured in the coronal plane. Adjacent nodularity makes this suspicious for metastatic process. There is no effusion in the lung bases. Multifocal nodularity suggested bilaterally at the lung bases on contrasted images, image 1-22 of series 25. Hepatobiliary: Attenuated/narrowed but nondilated biliary tree. Cholelithiasis without pericholecystic stranding. Sludge in the gallbladder. Similar appearance of pancreatic metastatic lesions in the liver as compared to previous imaging with respect to distribution. Enlarging lesion in hepatic subsegment VII/VIII (image 19/8 this measures 16 mm previously 5 mm. Other small lesions with similar appearance. 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 Occlusion of splenic vein and SMV as well with numerous upper abdominal collateral pathways. Pancreas: Ill-defined mass in the region of the pancreatic neck and body with encasement of the celiac, SMA and portal venous structures including SMV and splenic vein as described. Spleen:  Enlargement of the spleen now 16 cm. Adrenals/Urinary Tract: Adrenal glands are normal. Kidneys enhance symmetrically with renal cyst on the RIGHT. 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 No acute abdominal  process to the extent evaluated on abdominal MRI not performed for bowel evaluation. Vascular involvement Vascular/Lymphatic:  From pancreatic neoplasm. Other:  No ascites Musculoskeletal: No suspicious bone lesions identified. IMPRESSION: Locally advanced and metastatic pancreatic cancer with signs of worsening of dominant lesions in the liver and with suspected pulmonary metastatic disease. Consider chest CT for further evaluation. Increasing size of the spleen, attention on follow-up now approximately 16 cm as compared to 15 cm greatest craniocaudal dimension. Gastric varices. Cholelithiasis and sludge with attenuation of the common bile duct but without central dilation, attention on follow-up. Electronically Signed   By: GZetta BillsM.D.   On: 08/20/2021 10:19   CT Angio Abd/Pel w/ and/or w/o  Result Date: 08/27/2021 CLINICAL DATA:  Acute, nonlocalized abdominal pain blood in stool yesterday EXAM: CT ANGIOGRAPHY ABDOMEN TECHNIQUE: Multidetector CT imaging of the abdomen was performed using the standard protocol during bolus administration of intravenous contrast. Multiplanar reconstructed images and MIPs were obtained and reviewed to evaluate the vascular anatomy. CONTRAST:  1030mOMNIPAQUE IOHEXOL 350 MG/ML SOLN COMPARISON:  MR of the abdomen from 8 days ago FINDINGS: VASCULAR Aorta: Diffuse atheromatous wall thickening of the aorta. Negative for aneurysm or dissection Celiac: Pancreatic mass with stable irregular and beaded appearance to the left gastric, splenic, and proximal hepatic arteries. No acute finding SMA: No acute finding or aneurysm. No evidence of angio dysplasia. No stenosis. Renals: Accessory right lower pole artery. No acute vascular finding. 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 Multiple varices in the upper  abdomen including periesophageal and gastric. Splenomegaly. No acute venous occlusion is noted. Portal vein remains occluded beginning at the level of the infiltrative pancreas mass. Review of the MIP images confirms the above findings. NON-VASCULAR Lower chest: Extensive reticulonodular opacity at the bases. Staging chest CT was performed yesterday. Hepatobiliary: Low-density liver masses attributed to metastatic disease given change from 2021, recently evaluated by MRI. The largest is in the lower central liver on 11:24 at 26 mm. Pancreas: Pancreas mass with background pancreas atrophy, mass difficult to measure given diffuse infiltrative appearance in the retroperitoneum. Spleen: Enlarged in the setting of portal hypertension. Adrenals/Urinary Tract: No hydronephrosis or adrenal mass. Stomach/Bowel: Moderate distension of the stomach by partially high-density semi-solid material. No active intraluminal bleeding is seen into stomach. There is submucosal low-density expansion of the ileum and proximal colon. Negative for bowel obstruction. Additional area of thickening at the rectum where there are internal hemorrhoids associated with the portal hypertension. Lymphatic: No discrete and measurable lymphadenopathy. Other: No ascites or pneumoperitoneum Musculoskeletal: No acute or aggressive finding IMPRESSION: 1. The stomach is moderately distended, possible intraluminal clot if no recent meal. No active intraluminal hemorrhage detected. 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 2. Ileal and proximal colonic wall edema which could be colitis or portal congestion. 3. Infiltrating pancreas mass, reference recent staging MRI of the abdomen Electronically Signed   By: Jorje Guild M.D.   On: 08/27/2021 04:32    ASSESSMENT AND PLAN: Pancreas cancer CT urology center 03/04/2020-haziness of the fat adjacent to the celiac axis and SMA with focal narrowing  of the SMV, splenic vein, and splenoportal confluence CT 07/07/2020-infiltrative retroperitoneal mass with encasement of the celiac axis and SMA, high-grade narrowing of the proximal portal vein with cavernous transformation, nonocclusive thrombus within the main portal vein, mild biliary ductal dilatation, diffuse hypodense/hypoenhancing areas within the liver (edema versus infiltrating neoplasm), 2 x 1.7 cm area of focal prominence in the pancreas EUS 07/10/2020-no pancreas mass identified, 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, 1.5 cm aortocaval node biopsy-scant lymphoid material, no malignancy Diagnostic laparoscopy 07/17/2020-segment 3 and 4 liver nodules, adenocarcinoma, positive for pankeratin, CK7 and CK20, partially positive for TTF-1, negative for CDX2 Foundation 1-microsatellite stable, tumor mutation burden 4, K-ras G12D, subclonal RB1 alteration Elevated CA 19-9 Cycle 1 FOLFOX 07/28/2020 Cycle 2 FOLFOX 08/18/2020, oxaliplatin dose reduced due to thrombocytopenia Cycle 3 FOLFOX 09/08/2020 Cycle 4 FOLFOX 09/29/2020  Cycle 5 FOLFOX 10/20/2020 CTs 11/06/2020-grossly stable infiltrative mass within the pancreatic head and porta hepatis.  New small low-density liver lesions.  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 Cycle 6 FOLFOX 11/10/2021  Cycle 7 FOLFOX 12/01/2020 MRI liver 12/04/2020-no significant change in posttreatment appearance of the pancreatic head.  Numerous intrinsically low signal hypoenhancing lesions of the liver parenchyma predominantly observed in the right lobe of the liver and liver dome, measuring no greater than 8 mm and as seen on recent prior CT of the abdomen/pelvis. Cycle 8 FOLFOX 12/22/2020 Cycle 9 FOLFOX 01/12/2021 Cycle 10 FOLFOX 02/02/2021 MRI liver 02/19/2021-no change in pancreas head mass, multiple hypoenhancing liver lesions are new  and increased in size, effacement of portal vein by pancreas head mass with cavernous transformation, no change in  mild splenomegaly Cycle 1 gemcitabine/Abraxane 03/12/2021 Cycle 2 gemcitabine/Abraxane 03/27/2021- dose reductions secondary to neutropenia Cycle 3 gemcitabine/Abraxane 04/17/2021-dose reductions due to thrombocytopenia, white cell growth factor support added Cycle 4 gemcitabine/Abraxane 05/01/2021, Ziextenzo Cycle 5 gemcitabine/Abraxane 05/15/2021, Ziextenzo MRI abdomen 05/26/2021-mild enlargement of dominant hepatic metastases, appearance of infiltrative pancreas mass extending to the celiac trunk and SMA, splenic vein occlusion, cavernous transformation of the portal vein with upper abdomen collaterals, splenomegaly, mild upper abdominal ascites and mesenteric edema, minimal subpleural nodularity left lower lobe Cycle 6 gemcitabine/Abraxane 05/28/2021 Cycle 7 gemcitabine/Abraxane 06/12/2021 Cycle 8 gemcitabine/Abraxane 06/26/2021 Cycle 9 gemcitabine/Abraxane 07/10/2021 Cycle 10 gemcitabine/Abraxane 07/23/2021 Cycle 11 gemcitabine/Abraxane 08/06/2021 MRI abdomen 08/20/2021-multifocal nodularity in the lower chest with a new discrete right lower lobe nodule, enlarging subsegment 7/8 liver lesion, other small lesions appear stable, gastric varices, review of images with radiology-enlargement of at least 2 liver lesions, suspicious nodule n the right lower lung   Chronic thrombocytopenia- weekly Nplate beginning 7/82/9562 Diabetes Hypertension Abdomen/back pain secondary to #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 Lawry has been receiving chemotherapy for pancreatic cancer.  He had evidence of disease progression on his most recent MRI.  Our plan is to discontinue his current chemotherapy regimen and have further discussion regarding salvage chemotherapy likely with 5-FU with liposomal Irinotecan.  He was scheduled to follow-up with [place] later this week to discuss salvage chemotherapy.  He is now admitted with a GI bleed.   Colonoscopy showed friable and inflamed mucosa throughout the entire colon.  Biopsies were taken and are currently pending.  He still reports some intermittent dark stools but no obvious bleeding.  His hemoglobin today is stable.  At this time, the patient appears to be stable overall.    He may be discharged home from our standpoint with outpatient follow-up already scheduled on 09/02/2021.  At that visit, we will discuss salvage chemotherapy.  Recommendations: 1.  We will continue to follow labs as an outpatient and transfuse as needed. 2.  Okay to discharge from our standpoint if otherwise medically stable.  He has outpatient follow-up already scheduled at the cancer center on 09/02/2021.  Future Appointments  Date Time Provider [place]  09/02/2021  2:00 PM Ladell Pier, MD [facility] None  09/03/2021  8:00 AM [facility] PHLEBOTOMIST [facility] None  09/03/2021  8:15 AM [facility] FLUSH ROOM [facility] None  09/03/2021  8:45 AM Owens Shark, NP [facility] None  09/03/2021  9:30 AM [facility] CHAIR 4 [facility] None      LOS: 4 days   Mikey Bussing, DNP, AGPCNP-BC, AOCNP 08/31/21 Mr. Bowden was interviewed and examined. The hb is stable.  He continues to have some bleeding.  Colonoscopy results noted.  The etiology of the mucosal change and bleeding is unclear.  We will follow up on the colon biopsy pathology. Mr. Hunsucker is scheduled for outpatient f/u on 10/19, we will adjust the  appt date if he remains in the hospital.  I was present for greater than 50% of todays visit.  I performed medical decision making.

## 2021-08-31 NOTE — Plan of Care (Signed)
  Problem: Education: Goal: Knowledge of General Education information will improve Description: Including pain rating scale, medication(s)/side effects and non-pharmacologic comfort measures Outcome: Progressing   Problem: Health Behavior/Discharge Planning: Goal: Ability to manage health-related needs will improve Outcome: Progressing   Problem: Activity: Goal: Risk for activity intolerance will decrease Outcome: Progressing   Problem: Nutrition: Goal: Adequate nutrition will be maintained Outcome: Progressing   Problem: Coping: Goal: Level of anxiety will decrease Outcome: Progressing   Problem: Elimination: Goal: Will not experience complications related to bowel motility Outcome: Progressing Goal: Will not experience complications related to urinary retention Outcome: Progressing   Problem: Pain Managment: Goal: General experience of comfort will improve Outcome: Progressing   Problem: Safety: Goal: Ability to remain free from injury will improve Outcome: Progressing   Problem: Skin Integrity: Goal: Risk for impaired skin integrity will decrease Outcome: Progressing   Problem: Education: Goal: Ability to identify signs and symptoms of gastrointestinal bleeding will improve Outcome: Progressing   Problem: Bowel/Gastric: Goal: Will show no signs and symptoms of gastrointestinal bleeding Outcome: Progressing   Problem: Fluid Volume: Goal: Will show no signs and symptoms of excessive bleeding Outcome: Progressing   Problem: Clinical Measurements: Goal: Complications related to the disease process, condition or treatment will be avoided or minimized Outcome: Progressing

## 2021-08-31 NOTE — Progress Notes (Signed)
PROGRESS NOTE  Aaron Johnston  QHU:[id number] DOB: 04/08/1946 DOA: 08/27/2021 PCP: [name], [name], MD   Brief Narrative: Aaron Johnston is a 75 y.o. male with a history of metastatic pancreatic cancer having progressed on chemotherapy, HTN, HLD, GERD, and chronic pain who presented to the ED 10/13 with dark and light red blood per rectum. Hgb down to 6.3g/dl, INR >10. Given 1u PRBCs, vitamin K, and admitted with GI consulted. EGD showed nonbleeding varices and colonoscopy revealed diffuse friable colonic mucosa with edema, inflammation, and contact bleeding. GI signed off    Assessment & Plan: Active Problems:   GI bleed   Protein-calorie malnutrition, severe  Acute blood loss anemia due to colonic lower GI bleeding: Due to friable mucosa seen on colonoscopy 10/15.  - Monitor hgb serially and transfuse prn.  - Pt still bleeding this afternoon, so will keep an additional day. - Recheck INR in AM (?lab error initially with elevated INR). - Empiric PPI.  Metastatic pancreatic CA:  - Oncology following, has secured follow up 10/19, planning on discussing salvage chemotherapy options.  - Continue creon for exocrine insufficiency.   Esophageal varices:  - Propranolol  T2DM: Stable.   Thrombocytopenia:  - Weekly romiplostim per oncology as outpatient.   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  - Stable  Severe protein calorie malnutrition:  - Supplement protein as able  DVT prophylaxis: SCDs Code Status: Full Family Communication: Wife at bedside Disposition Plan:  Status is: Inpatient  Remains inpatient appropriate because: Continued GI bleeding.  Consultants:  GI Oncology  Procedures:  EGD, colonoscopy  Antimicrobials: None   Subjective: Still having some dark red blood today. Eating a little, not much. No chest pain, dyspnea, dizziness.   Objective: Vitals:   08/30/21 2131 08/30/21 2152 08/31/21 0547 08/31/21  1342  BP: (!) 128/59 139/84 118/67 107/66  Pulse: 79 90 73 70  Resp: 18  18 16   Temp: 98.8 F (37.1 C)  98.6 F (37 C) 98.6 F (37 C)  TempSrc: Oral  Oral Oral  SpO2: 100%  99% 100%  Weight:      Height:        Intake/Output Summary (Last 24 hours) at 08/31/2021 1648 Last data filed at 08/30/2021 2150 Gross per 24 hour  Intake 360 ml  Output --  Net 360 ml   Filed Weights   08/27/21 0552 08/27/21 1318 08/29/21 0943  Weight: 60.4 kg 60.4 kg 59.4 kg    Gen: 75 y.o. male in no distress Pulm: Non-labored breathing room air. Clear to auscultation bilaterally.  CV: Regular rate and rhythm. No murmur, rub, or gallop. No JVD, no pedal edema. GI: Abdomen soft, non-tender, non-distended, with normoactive bowel sounds. No organomegaly or masses felt. Ext: Warm, no deformities Skin: No rashes, lesions or ulcers Neuro: Alert and oriented. No focal neurological deficits. Psych: Judgement and insight appear normal. Mood & affect appropriate.   Data Reviewed: I have personally reviewed following labs and imaging studies  CBC: Recent Labs  Lab 08/27/21 0251 08/27/21 0619 08/27/21 0926 08/28/21 0407 08/29/21 0654 08/29/21 0952 08/30/21 0521 08/31/21 1030  WBC 8.7 8.4  --  8.4 7.3  --  9.3 8.0  NEUTROABS 7.1  --   --   --  5.9  --   --   --   HGB 6.3* 6.6*   < > 7.3* 9.0* 9.2* 8.9* 8.8*  HCT 19.7* 21.7*   < > 23.0* 28.4* 27.0* 28.8* 27.8*  MCV 101.5*  108.5*  --  101.8* 101.8*  --  102.9* 102.6*  PLT 172 169  --  132* 138*  --  145* 123*   < > = values in this interval not displayed.   Basic Metabolic Panel: Recent Labs  Lab 08/27/21 0619 08/27/21 0926 08/28/21 0407 08/29/21 0654 08/29/21 0952 08/31/21 1030  NA 142 139 140 139 143 140  K 4.7 4.4 3.8 3.9 3.9 3.5  CL 119* 115* 116* 114* 114* 115*  CO2 18* 18* 18* 19*  --  19*  GLUCOSE 127* 95 100* 106* 102* 181*  BUN 22 22 18 15 13 22   CREATININE 1.16 1.10 1.16 1.04 1.20 1.17  CALCIUM 8.6* 8.4* 8.2* 8.9  --  8.5*    GFR: Estimated Creatinine Clearance: 47.2 mL/min (by C-G formula based on SCr of 1.17 mg/dL). Liver Function Tests: Recent Labs  Lab 08/27/21 0251 08/27/21 0926 08/28/21 0407 08/29/21 0654  AST 39 39 33 27  ALT 45* 50* 45* 41  ALKPHOS 234* 236* 224* 222*  BILITOT 0.5 0.6 0.8 1.4*  PROT 5.4* 5.7* 5.7* 6.1*  ALBUMIN 3.2* 3.3* 3.0* 3.3*   No results for input(s): LIPASE, AMYLASE in the last 168 hours. No results for input(s): AMMONIA in the last 168 hours. Coagulation Profile: Recent Labs  Lab 08/27/21 0251 08/27/21 0926 08/29/21 0654  INR >10.0* 1.2 1.2   Cardiac Enzymes: No results for input(s): CKTOTAL, CKMB, CKMBINDEX, TROPONINI in the last 168 hours. BNP (last 3 results) No results for input(s): PROBNP in the last 8760 hours. HbA1C: No results for input(s): HGBA1C in the last 72 hours. CBG: Recent Labs  Lab 08/30/21 1127 08/30/21 1633 08/30/21 2129 08/31/21 0738 08/31/21 1123  GLUCAP 168* 179* 147* 109* 161*   Lipid Profile: No results for input(s): CHOL, HDL, LDLCALC, TRIG, CHOLHDL, LDLDIRECT in the last 72 hours. Thyroid Function Tests: No results for input(s): TSH, T4TOTAL, FREET4, T3FREE, THYROIDAB in the last 72 hours. Anemia Panel: No results for input(s): VITAMINB12, FOLATE, FERRITIN, TIBC, IRON, RETICCTPCT in the last 72 hours. Urine analysis:    Component Value Date/Time   COLORURINE YELLOW 10/22/2020 1529   APPEARANCEUR CLEAR 10/22/2020 1529   LABSPEC 1.014 10/22/2020 1529   PHURINE 5.0 10/22/2020 1529   GLUCOSEU 50 (A) 10/22/2020 1529   HGBUR SMALL (A) 10/22/2020 1529   BILIRUBINUR NEGATIVE 10/22/2020 1529   KETONESUR NEGATIVE 10/22/2020 1529   PROTEINUR NEGATIVE 10/22/2020 1529   NITRITE NEGATIVE 10/22/2020 1529   LEUKOCYTESUR NEGATIVE 10/22/2020 1529   Recent Results (from the past 240 hour(s))  Resp Panel by RT-PCR (Flu A&B, Covid) Nasopharyngeal Swab     Status: None   Collection Time: 08/27/21  2:40 AM   Specimen: Nasopharyngeal  Swab; Nasopharyngeal(NP) swabs in vial transport medium  Result Value Ref Range Status   SARS Coronavirus 2 by RT PCR NEGATIVE NEGATIVE Final    Comment: (NOTE) SARS-CoV-2 target nucleic acids are NOT DETECTED.  The SARS-CoV-2 RNA is generally detectable in upper respiratory specimens during the acute phase of infection. The lowest concentration of SARS-CoV-2 viral copies this assay can detect is 138 copies/mL. A negative result does not preclude SARS-Cov-2 infection and should not be used as the sole basis for treatment or other patient management decisions. A negative result may occur with  improper specimen collection/handling, submission of specimen other than nasopharyngeal swab, presence of viral mutation(s) within the areas targeted by this assay, and inadequate number of viral copies(<138 copies/mL). A negative result must be combined with clinical observations,  patient history, and epidemiological information. The expected result is Negative.  Fact Sheet for Patients:  EntrepreneurPulse.com.au  Fact Sheet for Healthcare Providers:  IncredibleEmployment.be  This test is no t yet approved or cleared by the [place] FDA and  has been authorized for detection and/or diagnosis of SARS-CoV-2 by FDA under an Emergency Use Authorization (EUA). This EUA will remain  in effect (meaning this test can be used) for the duration of the COVID-19 declaration under Section 564(b)(1) of the Act, 21 U.S.C.section 360bbb-3(b)(1), unless the authorization is terminated  or revoked sooner.       Influenza A by PCR NEGATIVE NEGATIVE Final   Influenza B by PCR NEGATIVE NEGATIVE Final    Comment: (NOTE) The Xpert Xpress SARS-CoV-2/FLU/RSV plus assay is intended as an aid in the diagnosis of influenza from Nasopharyngeal swab specimens and should not be used as a sole basis for treatment. Nasal washings and aspirates are unacceptable for Xpert Xpress  SARS-CoV-2/FLU/RSV testing.  Fact Sheet for Patients: EntrepreneurPulse.com.au  Fact Sheet for Healthcare Providers: IncredibleEmployment.be  This test is not yet approved or cleared by the [place] FDA and has been authorized for detection and/or diagnosis of SARS-CoV-2 by FDA under an Emergency Use Authorization (EUA). This EUA will remain in effect (meaning this test can be used) for the duration of the COVID-19 declaration under Section 564(b)(1) of the Act, 21 U.S.C. section 360bbb-3(b)(1), unless the authorization is terminated or revoked.  Performed at [facility], [address], [place], [place] [address]       Radiology Studies: No results found.  Scheduled Meds:  (feeding supplement) PROSource Plus  30 mL Oral BID BM   Chlorhexidine Gluconate Cloth  6 each Topical Daily   feeding supplement  1 Container Oral BID BM   insulin aspart  0-6 Units Subcutaneous TID WC   latanoprost  1 drop Both Eyes QHS   lipase/protease/amylase  72,000 Units Oral TID WC   multivitamin with minerals  1 tablet Oral Daily   pantoprazole  40 mg Oral Daily   propranolol  20 mg Oral BID   sodium chloride flush  10-40 mL Intracatheter Q12H   Continuous Infusions:  lactated ringers Stopped (08/27/21 1800)     LOS: 4 days   Time spent: 25 minutes.  Patrecia Pour, MD [facility] [email or web address] 08/31/2021, 4:48 PM

## 2021-08-31 NOTE — Progress Notes (Signed)
Patient had 1 episode of vomiting. Emesis appearance looked like food. Gave PRN Zofran. PRN Zofran was effective. Patient continues to have loose stool per patient.

## 2021-08-31 NOTE — Anesthesia Postprocedure Evaluation (Signed)
Anesthesia Post Note  Patient: Nimai Burbach  Procedure(s) Performed: COLONOSCOPY WITH PROPOFOL BIOPSY     Patient location during evaluation: PACU Anesthesia Type: MAC Level of consciousness: awake and alert Pain management: pain level controlled Vital Signs Assessment: post-procedure vital signs reviewed and stable Respiratory status: spontaneous breathing, nonlabored ventilation, respiratory function stable and patient connected to nasal cannula oxygen Cardiovascular status: stable and blood pressure returned to baseline Postop Assessment: no apparent nausea or vomiting Anesthetic complications: no   No notable events documented.  Last Vitals:  Vitals:   08/30/21 2152 08/31/21 0547  BP: 139/84 118/67  Pulse: 90 73  Resp:  18  Temp:  37 C  SpO2:  99%    Last Pain:  Vitals:   08/31/21 0547  TempSrc: Oral  PainSc:                  Mong Neal S

## 2021-09-01 ENCOUNTER — Other Ambulatory Visit: Payer: Self-pay | Admitting: Oncology

## 2021-09-01 ENCOUNTER — Other Ambulatory Visit: Payer: Self-pay | Admitting: *Deleted

## 2021-09-01 DIAGNOSIS — I10 Essential (primary) hypertension: Secondary | ICD-10-CM | POA: Diagnosis not present

## 2021-09-01 DIAGNOSIS — E119 Type 2 diabetes mellitus without complications: Secondary | ICD-10-CM | POA: Diagnosis not present

## 2021-09-01 DIAGNOSIS — C250 Malignant neoplasm of head of pancreas: Secondary | ICD-10-CM | POA: Diagnosis not present

## 2021-09-01 DIAGNOSIS — D696 Thrombocytopenia, unspecified: Secondary | ICD-10-CM | POA: Diagnosis not present

## 2021-09-01 LAB — PREPARE RBC (CROSSMATCH)

## 2021-09-01 LAB — SURGICAL PATHOLOGY

## 2021-09-01 LAB — GLUCOSE, CAPILLARY
Glucose-Capillary: 115 mg/dL — ABNORMAL HIGH (ref 70–99)
Glucose-Capillary: 133 mg/dL — ABNORMAL HIGH (ref 70–99)
Glucose-Capillary: 163 mg/dL — ABNORMAL HIGH (ref 70–99)

## 2021-09-01 LAB — PROTIME-INR
INR: 1.3 — ABNORMAL HIGH (ref 0.8–1.2)
Prothrombin Time: 15.8 seconds — ABNORMAL HIGH (ref 11.4–15.2)

## 2021-09-01 LAB — HEMOGLOBIN AND HEMATOCRIT, BLOOD
HCT: 23.7 % — ABNORMAL LOW (ref 39.0–52.0)
Hemoglobin: 7.4 g/dL — ABNORMAL LOW (ref 13.0–17.0)

## 2021-09-01 MED ORDER — PROPRANOLOL HCL 10 MG PO TABS: 10.0000 mg | ORAL_TABLET | Freq: Two times a day (BID) | ORAL | Status: DC

## 2021-09-01 MED ORDER — PANTOPRAZOLE SODIUM 40 MG PO TBEC: 40.0000 mg | DELAYED_RELEASE_TABLET | Freq: Two times a day (BID) | ORAL | 0 refills | Status: DC

## 2021-09-01 MED ORDER — SODIUM CHLORIDE 0.9% IV SOLUTION: Freq: Once | INTRAVENOUS | Status: AC

## 2021-09-01 MED ORDER — PROPRANOLOL HCL 10 MG PO TABS: 10.0000 mg | ORAL_TABLET | Freq: Two times a day (BID) | ORAL | 0 refills | Status: DC

## 2021-09-01 NOTE — Progress Notes (Addendum)
HEMATOLOGY-ONCOLOGY PROGRESS NOTE  SUBJECTIVE: Mr. Wint appears stable this morning.  He denied melena and hematochezia this morning.  He denies nausea and vomiting.  He is hopeful to be discharged home today.  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)   07/28/2020 - 02/23/2021 Chemotherapy          03/12/2021 -  Chemotherapy   Patient is on Treatment Plan : PANCREATIC Abraxane / Gemcitabine D1,8,15 q28d      PHYSICAL EXAMINATION:  Vitals:   08/31/21 2126 09/01/21 0332  BP: 98/66 (!) 93/52  Pulse: 78 72  Resp: 16 16  Temp: 98.2 F (36.8 C) 98.2 F (36.8 C)  SpO2: 100% 100%   Filed Weights   08/27/21 0552 08/27/21 1318 08/29/21 0943  Weight: 60.4 kg 60.4 kg 59.4 kg    Intake/Output from previous day: 10/17 0701 - 10/18 0700 In: 250 [P.O.:240; I.V.:10] Out: -   GENERAL:alert, no distress and comfortable OROPHARYNX:no exudate, no erythema and lips, buccal mucosa, and tongue normal  LUNGS: clear to auscultation and percussion with normal breathing effort HEART: regular rate & rhythm and no murmurs ABDOMEN:abdomen soft, non-tender and normal bowel sounds NEURO: alert & oriented x 3 with fluent speech, no focal motor/sensory deficits  LABORATORY DATA:  I have reviewed the data as listed CMP Latest Ref Rng & Units 08/31/2021 08/29/2021 08/29/2021  Glucose 70 - 99 mg/dL 181(H) 102(H) 106(H)  BUN 8 - 23 mg/dL '22 13 15  ' Creatinine 0.61 - 1.24 mg/dL 1.17 1.20 1.04  Sodium 135 - 145 mmol/L 140 143 139  Potassium 3.5 - 5.1 mmol/L 3.5 3.9 3.9  Chloride 98 - 111 mmol/L 115(H) 114(H) 114(H)  CO2 22 - 32 mmol/L 19(L) - 19(L)  Calcium 8.9 - 10.3 mg/dL 8.5(L) - 8.9  Total Protein 6.5 - 8.1 g/dL - - 6.1(L)  Total Bilirubin 0.3 - 1.2 mg/dL - - 1.4(H)  Alkaline Phos 38 - 126 U/L - - 222(H)  AST 15 - 41 U/L - - 27  ALT 0 - 44 U/L - - 41    Lab Results  Component Value Date   WBC 8.0  08/31/2021   HGB 7.4 (L) 09/01/2021   HCT 23.7 (L) 09/01/2021   MCV 102.6 (H) 08/31/2021   PLT 123 (L) 08/31/2021   NEUTROABS 5.9 08/29/2021    CT Chest Wo Contrast  Result Date: 08/27/2021 CLINICAL DATA:  Pancreatic cancer, staging. EXAM: CT CHEST WITHOUT CONTRAST TECHNIQUE: Multidetector CT imaging of the chest was performed following the standard protocol without IV contrast. COMPARISON:  An abdominal CT of 11/06/2020.  No correlate chest CT. FINDINGS: Cardiovascular: Left Port-A-Cath tip at mid right atrium. Aortic atherosclerosis. Normal heart size, without pericardial effusion. Left main and 3 vessel coronary artery calcification. Mediastinum/Nodes: No mediastinal or definite hilar adenopathy, given limitations of unenhanced CT. Mild distal esophageal wall thickening. Lungs/Pleura: No pleural fluid. Diffuse, peripheral predominant areas of nodularity and nodular consolidation. Compared to 11/06/2020 imaged lung bases, this is increased. An index left upper lobe pleural based nodular consolidation at 1.8 cm anteriorly on 65/4. Pleural-based right lower lobe 6 mm pulmonary nodule on 79/4. Upper Abdomen: Caudate lobe enlargement. Progressive hepatic metastasis including in segment 4 B at 2.0 cm on 138/2 (compared to 11/06/2020). Normal imaged portions of the spleen, adrenal glands, left kidney. Upper pole right renal cyst. Pancreatic primary is poorly evaluated. Musculoskeletal: Sclerotic lesion within the T2 vertebral body  at 1.2 cm on 91/6. T7 mild to moderate compression deformity without ventral canal encroachment. IMPRESSION: 1. Since the abdominal CT of 11/06/2020 (no prior chest CTs for comparison) progression of diffuse pulmonary nodularity and nodular consolidation which is favored to represent metastatic disease. Given distribution and morphology, an atypical infectious process is possible but felt less likely. 2. Hepatic metastasis, better evaluated on recent abdominal MRI. 3. No thoracic  adenopathy. 4. Sclerotic lesion in the T3 vertebral body, suspicious for osseous metastasis. 5. T7 compression deformity. 6. Distal esophageal wall thickening, suspicious for esophagitis. 7. Coronary artery atherosclerosis. Aortic Atherosclerosis (ICD10-I70.0). Electronically Signed   By: Abigail Miyamoto M.D.   On: 08/27/2021 11:44   MR LIVER W WO CONTRAST  Result Date: 08/20/2021 CLINICAL DATA:  A 75 year old male presents with pancreatic cancer with metastatic disease for follow-up. EXAM: MRI ABDOMEN WITHOUT AND WITH CONTRAST TECHNIQUE: Multiplanar multisequence MR imaging of the abdomen was performed both before and after the administration of intravenous contrast. CONTRAST:  61m GADAVIST GADOBUTROL 1 MMOL/ML IV SOLN COMPARISON:  Comparison made with multiple prior imaging, most recent comparison imaging from May 26, 2021. FINDINGS: Lower chest: Multifocal nodularity is suspected in the lower chest. Discrete nodule along the pleural surface in the RIGHT lower lobe for instance (image 1/9) this was not present previously. On coronal images either airspace disease or pulmonary nodule (image 24/7) 20 x 13 mm as measured in the coronal plane. Adjacent nodularity makes this suspicious for metastatic process. There is no effusion in the lung bases. Multifocal nodularity suggested bilaterally at the lung bases on contrasted images, image 1-22 of series 25. Hepatobiliary: Attenuated/narrowed but nondilated biliary tree. Cholelithiasis without pericholecystic stranding. Sludge in the gallbladder. Similar appearance of pancreatic metastatic lesions in the liver as compared to previous imaging with respect to distribution. Enlarging lesion in hepatic subsegment VII/VIII (image 19/8 this measures 16 mm previously 5 mm. Other small lesions with similar appearance. 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 Occlusion of splenic vein and SMV as well with numerous upper abdominal collateral pathways. Pancreas:  Ill-defined mass in the region of the pancreatic neck and body with encasement of the celiac, SMA and portal venous structures including SMV and splenic vein as described. Spleen:  Enlargement of the spleen now 16 cm. Adrenals/Urinary Tract: Adrenal glands are normal. Kidneys enhance symmetrically with renal cyst on the RIGHT. 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 No acute abdominal process to the extent evaluated on abdominal MRI not performed for bowel evaluation. Vascular involvement Vascular/Lymphatic:  From pancreatic neoplasm. Other:  No ascites Musculoskeletal: No suspicious bone lesions identified. IMPRESSION: Locally advanced and metastatic pancreatic cancer with signs of worsening of dominant lesions in the liver and with suspected pulmonary metastatic disease. Consider chest CT for further evaluation. Increasing size of the spleen, attention on follow-up now approximately 16 cm as compared to 15 cm greatest craniocaudal dimension. Gastric varices. Cholelithiasis and sludge with attenuation of the common bile duct but without central dilation, attention on follow-up. Electronically Signed   By: GZetta BillsM.D.   On: 08/20/2021 10:19   MR 3D Recon At Scanner  Result Date: 08/20/2021 CLINICAL DATA:  A 75year old male presents with pancreatic cancer with metastatic disease for follow-up. EXAM: MRI ABDOMEN WITHOUT AND WITH CONTRAST TECHNIQUE: Multiplanar multisequence MR imaging of the abdomen was performed both before and after the administration of intravenous contrast. CONTRAST:  656mGADAVIST GADOBUTROL 1 MMOL/ML IV SOLN COMPARISON:  Comparison made with multiple prior  imaging, most recent comparison imaging from May 26, 2021. FINDINGS: Lower chest: Multifocal nodularity is suspected in the lower chest. Discrete nodule along the pleural surface in the RIGHT lower lobe for instance (image 1/9) this was not present previously. On coronal images  either airspace disease or pulmonary nodule (image 24/7) 20 x 13 mm as measured in the coronal plane. Adjacent nodularity makes this suspicious for metastatic process. There is no effusion in the lung bases. Multifocal nodularity suggested bilaterally at the lung bases on contrasted images, image 1-22 of series 25. Hepatobiliary: Attenuated/narrowed but nondilated biliary tree. Cholelithiasis without pericholecystic stranding. Sludge in the gallbladder. Similar appearance of pancreatic metastatic lesions in the liver as compared to previous imaging with respect to distribution. Enlarging lesion in hepatic subsegment VII/VIII (image 19/8 this measures 16 mm previously 5 mm. Other small lesions with similar appearance. 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 Occlusion of splenic vein and SMV as well with numerous upper abdominal collateral pathways. Pancreas: Ill-defined mass in the region of the pancreatic neck and body with encasement of the celiac, SMA and portal venous structures including SMV and splenic vein as described. Spleen:  Enlargement of the spleen now 16 cm. Adrenals/Urinary Tract: Adrenal glands are normal. Kidneys enhance symmetrically with renal cyst on the RIGHT. 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 No acute abdominal process to the extent evaluated on abdominal MRI not performed for bowel evaluation. Vascular involvement Vascular/Lymphatic:  From pancreatic neoplasm. Other:  No ascites Musculoskeletal: No suspicious bone lesions identified. IMPRESSION: Locally advanced and metastatic pancreatic cancer with signs of worsening of dominant lesions in the liver and with suspected pulmonary metastatic disease. Consider chest CT for further evaluation. Increasing size of the spleen, attention on follow-up now approximately 16 cm as compared to 15 cm greatest craniocaudal dimension. Gastric varices. Cholelithiasis and sludge with  attenuation of the common bile duct but without central dilation, attention on follow-up. Electronically Signed   By: Zetta Bills M.D.   On: 08/20/2021 10:19   CT Angio Abd/Pel w/ and/or w/o  Result Date: 08/27/2021 CLINICAL DATA:  Acute, nonlocalized abdominal pain blood in stool yesterday EXAM: CT ANGIOGRAPHY ABDOMEN TECHNIQUE: Multidetector CT imaging of the abdomen was performed using the standard protocol during bolus administration of intravenous contrast. Multiplanar reconstructed images and MIPs were obtained and reviewed to evaluate the vascular anatomy. CONTRAST:  169m OMNIPAQUE IOHEXOL 350 MG/ML SOLN COMPARISON:  MR of the abdomen from 8 days ago FINDINGS: VASCULAR Aorta: Diffuse atheromatous wall thickening of the aorta. Negative for aneurysm or dissection Celiac: Pancreatic mass with stable irregular and beaded appearance to the left gastric, splenic, and proximal hepatic arteries. No acute finding SMA: No acute finding or aneurysm. No evidence of angio dysplasia. No stenosis. Renals: Accessory right lower pole artery. No acute vascular finding. 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 Multiple varices in the upper abdomen including periesophageal and gastric. Splenomegaly. No acute venous occlusion is noted. Portal vein remains occluded beginning at the level of the infiltrative pancreas mass. Review of the MIP images confirms the above findings. NON-VASCULAR Lower chest: Extensive reticulonodular opacity at the bases. Staging chest CT was performed yesterday. Hepatobiliary: Low-density liver masses attributed to metastatic disease given change from 2021, recently evaluated by MRI. The largest is in the lower central liver on 11:24 at 26 mm. Pancreas: Pancreas mass  with background pancreas atrophy, mass difficult to measure  given diffuse infiltrative appearance in the retroperitoneum. Spleen: Enlarged in the setting of portal hypertension. Adrenals/Urinary Tract: No hydronephrosis or adrenal mass. Stomach/Bowel: Moderate distension of the stomach by partially high-density semi-solid material. No active intraluminal bleeding is seen into stomach. There is submucosal low-density expansion of the ileum and proximal colon. Negative for bowel obstruction. Additional area of thickening at the rectum where there are internal hemorrhoids associated with the portal hypertension. Lymphatic: No discrete and measurable lymphadenopathy. Other: No ascites or pneumoperitoneum Musculoskeletal: No acute or aggressive finding IMPRESSION: 1. The stomach is moderately distended, possible intraluminal clot if no recent meal. No active intraluminal hemorrhage detected. 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 2. Ileal and proximal colonic wall edema which could be colitis or portal congestion. 3. Infiltrating pancreas mass, reference recent staging MRI of the abdomen Electronically Signed   By: Jorje Guild M.D.   On: 08/27/2021 04:32    ASSESSMENT AND PLAN: Pancreas cancer CT urology center 03/04/2020-haziness of the fat adjacent to the celiac axis and SMA with focal narrowing of the SMV, splenic vein, and splenoportal confluence CT 07/07/2020-infiltrative retroperitoneal mass with encasement of the celiac axis and SMA, high-grade narrowing of the proximal portal vein with cavernous transformation, nonocclusive thrombus within the main portal vein, mild biliary ductal dilatation, diffuse hypodense/hypoenhancing areas within the liver (edema versus infiltrating neoplasm), 2 x 1.7 cm area of focal prominence in the pancreas EUS 07/10/2020-no pancreas mass identified, 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, 1.5 cm aortocaval node biopsy-scant lymphoid material, no  malignancy Diagnostic laparoscopy 07/17/2020-segment 3 and 4 liver nodules, adenocarcinoma, positive for pankeratin, CK7 and CK20, partially positive for TTF-1, negative for CDX2 Foundation 1-microsatellite stable, tumor mutation burden 4, K-ras G12D, subclonal RB1 alteration Elevated CA 19-9 Cycle 1 FOLFOX 07/28/2020 Cycle 2 FOLFOX 08/18/2020, oxaliplatin dose reduced due to thrombocytopenia Cycle 3 FOLFOX 09/08/2020 Cycle 4 FOLFOX 09/29/2020  Cycle 5 FOLFOX 10/20/2020 CTs 11/06/2020-grossly stable infiltrative mass within the pancreatic head and porta hepatis.  New small low-density liver lesions.  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 Cycle 6 FOLFOX 11/10/2021  Cycle 7 FOLFOX 12/01/2020 MRI liver 12/04/2020-no significant change in posttreatment appearance of the pancreatic head.  Numerous intrinsically low signal hypoenhancing lesions of the liver parenchyma predominantly observed in the right lobe of the liver and liver dome, measuring no greater than 8 mm and as seen on recent prior CT of the abdomen/pelvis. Cycle 8 FOLFOX 12/22/2020 Cycle 9 FOLFOX 01/12/2021 Cycle 10 FOLFOX 02/02/2021 MRI liver 02/19/2021-no change in pancreas head mass, multiple hypoenhancing liver lesions are new and increased in size, effacement of portal vein by pancreas head mass with cavernous transformation, no change in mild splenomegaly Cycle 1 gemcitabine/Abraxane 03/12/2021 Cycle 2 gemcitabine/Abraxane 03/27/2021- dose reductions secondary to neutropenia Cycle 3 gemcitabine/Abraxane 04/17/2021-dose reductions due to thrombocytopenia, white cell growth factor support added Cycle 4 gemcitabine/Abraxane 05/01/2021, Ziextenzo Cycle 5 gemcitabine/Abraxane 05/15/2021, Ziextenzo MRI abdomen 05/26/2021-mild enlargement of dominant hepatic metastases, appearance of infiltrative pancreas mass extending to the  celiac trunk and SMA, splenic vein occlusion, cavernous transformation of the portal vein with upper abdomen collaterals, splenomegaly, mild upper abdominal ascites and mesenteric edema, minimal subpleural nodularity left lower lobe Cycle 6 gemcitabine/Abraxane 05/28/2021 Cycle 7 gemcitabine/Abraxane 06/12/2021 Cycle 8 gemcitabine/Abraxane 06/26/2021 Cycle 9 gemcitabine/Abraxane 07/10/2021 Cycle 10 gemcitabine/Abraxane  07/23/2021 Cycle 11 gemcitabine/Abraxane 08/06/2021 MRI abdomen 08/20/2021-multifocal nodularity in the lower chest with a new discrete right lower lobe nodule, enlarging subsegment 7/8 liver lesion, other small lesions appear stable, gastric varices, review of images with radiology-enlargement of at least 2 liver lesions, suspicious nodule n the right lower lung   Chronic thrombocytopenia- weekly Nplate beginning 04/28/4314 Diabetes Hypertension Abdomen/back pain secondary to #1 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 Laban appears stable.  He denies melena and hematochezia.  Hemoglobin earlier this morning was down to 7.4.  I note that a repeat CBC and type and screen are currently pending.  The patient may benefit from transfusion of 1 unit PRBCs before consideration of discharge.  The patient has been receiving chemotherapy for pancreatic cancer.  He had evidence of disease progression on his most recent MRI.  Our plan is to discontinue his current chemotherapy regimen and have further discussion regarding salvage  chemotherapy likely with 5-FU with liposomal Irinotecan.    Recommendations: 1.  Follow-up on repeat CBC ordered for this morning and consider PRBC transfusion. 2.  We will follow-up on pending biopsies from colonoscopy. 3.  Okay to discharge from our standpoint if otherwise medically stable.  We will arrange for repeat labs and possible transfusion later this week and reschedule his follow-up visit to begin salvage chemotherapy to next week.  Future Appointments  Date Time Provider [place]  09/02/2021  2:00 PM Ladell Pier, MD [facility] None  09/03/2021  8:00 AM [facility] PHLEBOTOMIST [facility] None  09/03/2021  8:15 AM [facility] FLUSH ROOM [facility] None  09/03/2021  8:45 AM Owens Shark, NP [facility] None  09/03/2021  9:30 AM [facility] CHAIR 4 [facility] None      LOS: 5 days   Mikey Bussing, DNP, AGPCNP-BC, AOCNP 09/01/21 Mr. Dimarco was interviewed and examined.  He appears stable.  The hemoglobin is slightly lower today.  The GI bleeding appears to have slowed.  The pathology from the colon biopsy revealed colitis.  It is unclear whether this explains the gross GI bleeding, but this is possible.  Mr. Cumbie will return to the cancer center for a CBC on 09/03/2021.  He will be scheduled for an office visit and 5-FU/liposomal irinotecan on 09/07/2021.  I reviewed potential toxicities associated with irinotecan and 5-FU with Mr. Alter and his wife.  He understands potential for nausea/vomiting, mucositis, alopecia, and hematologic toxicity.  We discussed the skin toxicity associated with 5-fluorouracil.  We reviewed the acute and delayed diarrhea seen with irinotecan.  He agrees to proceed.  A chemotherapy plan was entered today.  I was present for greater than 50% of today's visit.  I performed medical stage making.

## 2021-09-01 NOTE — Discharge Summary (Signed)
Physician Discharge Summary  Aaron Johnston OIZ:[id number] DOB: [birth year] DOA: 08/27/2021  PCP: [name], [name], MD  Admit date: 08/27/2021 Discharge date: 09/01/2021  Admitted From: Home Disposition: Home   Recommendations for Outpatient Follow-up:  Follow up with oncology later this week for repeat CBC, consideration of transfusion support. Also follow up to consider initiation of salvage chemotherapy.  Home Health: None Equipment/Devices: None Discharge Condition: Stable CODE STATUS: Full Diet recommendation: As tolerated  Brief/Interim Summary: Aaron Johnston is a 75 y.o. male with a history of metastatic pancreatic cancer having progressed on chemotherapy, HTN, HLD, GERD, and chronic pain who presented to the ED 10/13 with dark and light red blood per rectum. Hgb down to 6.3g/dl, INR >10. Given 1u PRBCs, vitamin K, and admitted with GI consulted. EGD showed nonbleeding varices and colonoscopy revealed diffuse friable colonic mucosa with edema, inflammation, and contact bleeding. GI signed off and the patient's bleeding has improved. Hgb slightly down, for which 1u PRBCs is to be given prior to discharge. The patient has reliable follow up and expresses understanding of return precautions. After discussion with his oncologist, we have secured follow up in the clinic for CBC and continued transfusions if needed.   Discharge Diagnoses:  Active Problems:   GI bleed   Protein-calorie malnutrition, severe  Acute blood loss anemia due to colonic lower GI bleeding: Due to friable mucosa seen on colonoscopy 10/15. Bleeding improved, will give 1u PRBCs prior to discharge and follow up in onc clinic. INR 1.3.  - Monitor hgb serially and transfuse prn.   Metastatic pancreatic CA:  - Oncology following, has secured follow up shortly after DC. They will be able to provide additional transfusion support if needed, feel patient can be safely discharged.  - Continue creon for exocrine insufficiency.     Esophageal varices:  - Propranolol, will lower dose as much as possible with soft BPs. Hold parameters discussed with family/pt.    T2DM: Stable.    Thrombocytopenia:  - Weekly romiplostim per oncology as outpatient.    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  - Stable   Severe protein calorie malnutrition:  - Supplement protein as able  Discharge Instructions Discharge Instructions     Discharge instructions   Complete by: As directed    You are stable for discharge with improvement in bleeding. You will receive an additional unit of red blood cells prior to discharge and will follow up with Dr. Gearldine Shown office for blood count monitoring and, if needed, additional transfusions. If your bleeding worsens, seek medical attention right away.   It is recommended that you take propranolol twice daily to reduce risk of bleeding from varices. If your blood pressure is below 100/60 or heart rate is below 60, do not take the medication. Otherwise, continue medications as you were taking, and continue routine follow up with Dr. Marlou Sa.      Allergies as of 09/01/2021   No Known Allergies      Medication List     STOP taking these medications    HUMALOG KWIKPEN [place]   ramipril 5 MG capsule Commonly known as: ALTACE   simvastatin 80 MG tablet Commonly known as: ZOCOR   sitaGLIPtin 100 MG tablet Commonly known as: JANUVIA       TAKE these medications    Accu-Chek FastClix Lancets Misc Apply topically.   Accu-Chek Guide test strip Generic drug: glucose blood   Accu-Chek Guide w/Device Kit See admin instructions.   diphenhydrAMINE  50 MG capsule Commonly known as: BENADRYL Take 50 mg by mouth daily as needed for itching (after [place]).   diphenoxylate-atropine 2.5-0.025 MG tablet Commonly known as: LOMOTIL Take 1-2 tablets by mouth 4 (four) times daily as needed for diarrhea or loose stools.   famotidine 10 MG  tablet Commonly known as: PEPCID Take 10 mg by mouth daily as needed. Takes x 1 week after each tx for itching   glucosamine-chondroitin 500-400 MG tablet Take 1 tablet by mouth daily.   HYDROcodone-acetaminophen 5-325 MG tablet Commonly known as: NORCO/VICODIN Take 1-2 tablets by mouth every 4 (four) hours as needed. What changed: reasons to take this   latanoprost 0.005 % ophthalmic solution Commonly known as: XALATAN Place 1 drop into both eyes at bedtime.   lidocaine-prilocaine cream Commonly known as: EMLA Apply 1 application topically as directed. Apply to port site 1 hour prior to stick and cover with plastic wrap   lipase/protease/amylase 36000 UNITS Cpep capsule Commonly known as: Creon Take 2 capsules (72,000 Units total) by mouth 3 (three) times daily with meals. May also take 1 capsule (36,000 Units total) as needed.   loperamide 2 MG capsule Commonly known as: IMODIUM Take 2-4 mg by mouth as needed for diarrhea or loose stools.   loratadine 10 MG tablet Commonly known as: CLARITIN Take 10 mg by mouth daily as needed for itching.   magnesium oxide 400 MG tablet Commonly known as: MAG-OX Take 1 tablet (400 mg total) by mouth 2 (two) times daily.   ondansetron 8 MG tablet Commonly known as: ZOFRAN Take 1 tablet (8 mg total) by mouth every 8 (eight) hours as needed for nausea or vomiting.   pantoprazole 40 MG tablet Commonly known as: PROTONIX Take 40 mg by mouth 2 (two) times daily as needed (acid reflux).   pioglitazone 45 MG tablet Commonly known as: ACTOS Take 45 mg by mouth daily.   potassium chloride SA 20 MEQ tablet Commonly known as: Klor-Con M20 Take 1 tablet (20 mEq total) by mouth daily.   prochlorperazine 10 MG tablet Commonly known as: COMPAZINE Take 1 tablet (10 mg total) by mouth every 6 (six) hours as needed for nausea.   propranolol 10 MG tablet Commonly known as: INDERAL Take 1 tablet (10 mg total) by mouth 2 (two) times daily.    tadalafil 20 MG tablet Commonly known as: CIALIS Take 20 mg by mouth daily as needed for erectile dysfunction.        Follow-up Information     [name], [name], MD Follow up.   Specialty: Family Medicine Contact information: [address] [place] [address] [phone]         Ladell Pier, MD Follow up.   Specialty: Oncology Contact information: [place] [address] [phone]                No Known Allergies  Consultations: Oncology  Procedures/Studies: CT Chest Wo Contrast  Result Date: 08/27/2021 CLINICAL DATA:  Pancreatic cancer, staging. EXAM: CT CHEST WITHOUT CONTRAST TECHNIQUE: Multidetector CT imaging of the chest was performed following the standard protocol without IV contrast. COMPARISON:  An abdominal CT of 11/06/2020.  No correlate chest CT. FINDINGS: Cardiovascular: Left Port-A-Cath tip at mid right atrium. Aortic atherosclerosis. Normal heart size, without pericardial effusion. Left main and 3 vessel coronary artery calcification. Mediastinum/Nodes: No mediastinal or definite hilar adenopathy, given limitations of unenhanced CT. Mild distal esophageal wall thickening. Lungs/Pleura: No pleural fluid. Diffuse, peripheral predominant  areas of nodularity and nodular consolidation. Compared to 11/06/2020 imaged lung bases, this is increased. An index left upper lobe pleural based nodular consolidation at 1.8 cm anteriorly on 65/4. Pleural-based right lower lobe 6 mm pulmonary nodule on 79/4. Upper Abdomen: Caudate lobe enlargement. Progressive hepatic metastasis including in segment 4 B at 2.0 cm on 138/2 (compared to 11/06/2020). Normal imaged portions of the spleen, adrenal glands, left kidney. Upper pole right renal cyst. Pancreatic primary is poorly evaluated. Musculoskeletal: Sclerotic lesion within the T2 vertebral body at 1.2 cm on 91/6. T7 mild to moderate compression deformity without ventral canal  encroachment. IMPRESSION: 1. Since the abdominal CT of 11/06/2020 (no prior chest CTs for comparison) progression of diffuse pulmonary nodularity and nodular consolidation which is favored to represent metastatic disease. Given distribution and morphology, an atypical infectious process is possible but felt less likely. 2. Hepatic metastasis, better evaluated on recent abdominal MRI. 3. No thoracic adenopathy. 4. Sclerotic lesion in the T3 vertebral body, suspicious for osseous metastasis. 5. T7 compression deformity. 6. Distal esophageal wall thickening, suspicious for esophagitis. 7. Coronary artery atherosclerosis. Aortic Atherosclerosis (ICD10-I70.0). Electronically Signed   By: Abigail Miyamoto M.D.   On: 08/27/2021 11:44   MR LIVER W WO CONTRAST  Result Date: 08/20/2021 CLINICAL DATA:  A 75 year old male presents with pancreatic cancer with metastatic disease for follow-up. EXAM: MRI ABDOMEN WITHOUT AND WITH CONTRAST TECHNIQUE: Multiplanar multisequence MR imaging of the abdomen was performed both before and after the administration of intravenous contrast. CONTRAST:  32m GADAVIST GADOBUTROL 1 MMOL/ML IV SOLN COMPARISON:  Comparison made with multiple prior imaging, most recent comparison imaging from May 26, 2021. FINDINGS: Lower chest: Multifocal nodularity is suspected in the lower chest. Discrete nodule along the pleural surface in the RIGHT lower lobe for instance (image 1/9) this was not present previously. On coronal images either airspace disease or pulmonary nodule (image 24/7) 20 x 13 mm as measured in the coronal plane. Adjacent nodularity makes this suspicious for metastatic process. There is no effusion in the lung bases. Multifocal nodularity suggested bilaterally at the lung bases on contrasted images, image 1-22 of series 25. Hepatobiliary: Attenuated/narrowed but nondilated biliary tree. Cholelithiasis without pericholecystic stranding. Sludge in the gallbladder. Similar appearance of  pancreatic metastatic lesions in the liver as compared to previous imaging with respect to distribution. Enlarging lesion in hepatic subsegment VII/VIII (image 19/8 this measures 16 mm previously 5 mm. Other small lesions with similar appearance. 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 Occlusion of splenic vein and SMV as well with numerous upper abdominal collateral pathways. Pancreas: Ill-defined mass in the region of the pancreatic neck and body with encasement of the celiac, SMA and portal venous structures including SMV and splenic vein as described. Spleen:  Enlargement of the spleen now 16 cm. Adrenals/Urinary Tract: Adrenal glands are normal. Kidneys enhance symmetrically with renal cyst on the RIGHT. 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 No acute abdominal process to the extent evaluated on abdominal MRI not performed for bowel evaluation. Vascular involvement Vascular/Lymphatic:  From pancreatic neoplasm. Other:  No ascites Musculoskeletal: No suspicious bone lesions identified. IMPRESSION: Locally advanced and metastatic pancreatic cancer with signs of worsening of dominant lesions in the liver and with suspected pulmonary metastatic disease. Consider chest CT for further evaluation. Increasing size of the spleen, attention on follow-up now approximately 16 cm as compared to 15 cm greatest craniocaudal dimension. Gastric varices. Cholelithiasis and sludge with attenuation of the common bile duct  but without central dilation, attention on follow-up. Electronically Signed   By: Zetta Bills M.D.   On: 08/20/2021 10:19   MR 3D Recon At Scanner  Result Date: 08/20/2021 CLINICAL DATA:  A 75 year old male presents with pancreatic cancer with metastatic disease for follow-up. EXAM: MRI ABDOMEN WITHOUT AND WITH CONTRAST TECHNIQUE: Multiplanar multisequence MR imaging of the abdomen was performed both before and after the administration  of intravenous contrast. CONTRAST:  66m GADAVIST GADOBUTROL 1 MMOL/ML IV SOLN COMPARISON:  Comparison made with multiple prior imaging, most recent comparison imaging from May 26, 2021. FINDINGS: Lower chest: Multifocal nodularity is suspected in the lower chest. Discrete nodule along the pleural surface in the RIGHT lower lobe for instance (image 1/9) this was not present previously. On coronal images either airspace disease or pulmonary nodule (image 24/7) 20 x 13 mm as measured in the coronal plane. Adjacent nodularity makes this suspicious for metastatic process. There is no effusion in the lung bases. Multifocal nodularity suggested bilaterally at the lung bases on contrasted images, image 1-22 of series 25. Hepatobiliary: Attenuated/narrowed but nondilated biliary tree. Cholelithiasis without pericholecystic stranding. Sludge in the gallbladder. Similar appearance of pancreatic metastatic lesions in the liver as compared to previous imaging with respect to distribution. Enlarging lesion in hepatic subsegment VII/VIII (image 19/8 this measures 16 mm previously 5 mm. Other small lesions with similar appearance. 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 Occlusion of splenic vein and SMV as well with numerous upper abdominal collateral pathways. Pancreas: Ill-defined mass in the region of the pancreatic neck and body with encasement of the celiac, SMA and portal venous structures including SMV and splenic vein as described. Spleen:  Enlargement of the spleen now 16 cm. Adrenals/Urinary Tract: Adrenal glands are normal. Kidneys enhance symmetrically with renal cyst on the RIGHT. 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 No acute abdominal process to the extent evaluated on abdominal MRI not performed for bowel evaluation. Vascular involvement Vascular/Lymphatic:  From pancreatic neoplasm. Other:  No ascites Musculoskeletal: No suspicious bone  lesions identified. IMPRESSION: Locally advanced and metastatic pancreatic cancer with signs of worsening of dominant lesions in the liver and with suspected pulmonary metastatic disease. Consider chest CT for further evaluation. Increasing size of the spleen, attention on follow-up now approximately 16 cm as compared to 15 cm greatest craniocaudal dimension. Gastric varices. Cholelithiasis and sludge with attenuation of the common bile duct but without central dilation, attention on follow-up. Electronically Signed   By: GZetta BillsM.D.   On: 08/20/2021 10:19   CT Angio Abd/Pel w/ and/or w/o  Result Date: 08/27/2021 CLINICAL DATA:  Acute, nonlocalized abdominal pain blood in stool yesterday EXAM: CT ANGIOGRAPHY ABDOMEN TECHNIQUE: Multidetector CT imaging of the abdomen was performed using the standard protocol during bolus administration of intravenous contrast. Multiplanar reconstructed images and MIPs were obtained and reviewed to evaluate the vascular anatomy. CONTRAST:  1088mOMNIPAQUE IOHEXOL 350 MG/ML SOLN COMPARISON:  MR of the abdomen from 8 days ago FINDINGS: VASCULAR Aorta: Diffuse atheromatous wall thickening of the aorta. Negative for aneurysm or dissection Celiac: Pancreatic mass with stable irregular and beaded appearance to the left gastric, splenic, and proximal hepatic arteries. No acute finding SMA: No acute finding or aneurysm. No evidence of angio dysplasia. No stenosis. Renals: Accessory right lower pole artery. No acute vascular finding. 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 Multiple varices in the upper abdomen including periesophageal and gastric. Splenomegaly. No acute venous occlusion is noted. Portal vein remains occluded beginning at the level of the infiltrative pancreas mass. Review of the MIP images  confirms the above findings. NON-VASCULAR Lower chest: Extensive reticulonodular opacity at the bases. Staging chest CT was performed yesterday. Hepatobiliary: Low-density liver masses attributed to metastatic disease given change from 2021, recently evaluated by MRI. The largest is in the lower central liver on 11:24 at 26 mm. Pancreas: Pancreas mass with background pancreas atrophy, mass difficult to measure given diffuse infiltrative appearance in the retroperitoneum. Spleen: Enlarged in the setting of portal hypertension. Adrenals/Urinary Tract: No hydronephrosis or adrenal mass. Stomach/Bowel: Moderate distension of the stomach by partially high-density semi-solid material. No active intraluminal bleeding is seen into stomach. There is submucosal low-density expansion of the ileum and proximal colon. Negative for bowel obstruction. Additional area of thickening at the rectum where there are internal hemorrhoids associated with the portal hypertension. Lymphatic: No discrete and measurable lymphadenopathy. Other: No ascites or pneumoperitoneum Musculoskeletal: No acute or aggressive finding IMPRESSION: 1. The stomach is moderately distended, possible intraluminal clot if no recent meal. No active intraluminal hemorrhage detected. 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 2. Ileal and proximal colonic wall edema which could be colitis or portal congestion. 3. Infiltrating pancreas mass, reference recent staging MRI of the abdomen Electronically Signed   By: Jorje Guild M.D.   On: 08/27/2021 04:32     Subjective: Feels fine, no more bleeding over past 24 hours. Eating some. Wants to go home.   Discharge Exam: Vitals:   09/01/21 0332 09/01/21 0957  BP: (!) 93/52 99/62  Pulse: 72 72  Resp: 16   Temp: 98.2 F (36.8 C)   SpO2: 100%    General: Pt is alert, awake, not in acute distress Cardiovascular: RRR, S1/S2 +, no rubs, no  gallops Respiratory: CTA bilaterally, no wheezing, no rhonchi Abdominal: Soft, NT, ND, bowel sounds + Extremities: No edema, no cyanosis  Labs: BNP (last 3 results) No results for input(s): BNP in the last 8760 hours. Basic Metabolic Panel: Recent Labs  Lab 08/27/21 0619 08/27/21 0926 08/28/21 0407 08/29/21 0654 08/29/21 0952 08/31/21 1030  NA 142 139 140 139 143 140  K 4.7 4.4 3.8 3.9 3.9 3.5  CL 119* 115* 116* 114* 114* 115*  CO2 18* 18* 18* 19*  --  19*  GLUCOSE 127* 95 100* 106* 102* 181*  BUN '22 22 18 15 13 22  ' CREATININE 1.16 1.10 1.16 1.04 1.20 1.17  CALCIUM 8.6* 8.4* 8.2* 8.9  --  8.5*   Liver Function Tests: Recent Labs  Lab 08/27/21 0251 08/27/21 0926 08/28/21 0407 08/29/21 0654  AST 39 39 33 27  ALT 45* 50* 45* 41  ALKPHOS 234* 236* 224* 222*  BILITOT 0.5 0.6 0.8 1.4*  PROT 5.4* 5.7* 5.7* 6.1*  ALBUMIN 3.2* 3.3* 3.0* 3.3*   No results for input(s): LIPASE, AMYLASE in the last 168 hours. No results for input(s): AMMONIA in the last 168 hours. CBC: Recent Labs  Lab 08/27/21 0251 08/27/21 0619 08/27/21 0926 08/28/21 0407 08/29/21 0654 08/29/21 0952 08/30/21 0521 08/31/21 1030 09/01/21 0324  WBC 8.7 8.4  --  8.4 7.3  --  9.3 8.0  --   NEUTROABS 7.1  --   --   --  5.9  --   --   --   --  HGB 6.3* 6.6*   < > 7.3* 9.0* 9.2* 8.9* 8.8* 7.4*  HCT 19.7* 21.7*   < > 23.0* 28.4* 27.0* 28.8* 27.8* 23.7*  MCV 101.5* 108.5*  --  101.8* 101.8*  --  102.9* 102.6*  --   PLT 172 169  --  132* 138*  --  145* 123*  --    < > = values in this interval not displayed.   Cardiac Enzymes: No results for input(s): CKTOTAL, CKMB, CKMBINDEX, TROPONINI in the last 168 hours. BNP: Invalid input(s): POCBNP CBG: Recent Labs  Lab 08/31/21 0738 08/31/21 1123 08/31/21 1701 08/31/21 2127 09/01/21 0726  GLUCAP 109* 161* 142* 151* 115*   D-Dimer No results for input(s): DDIMER in the last 72 hours. Hgb A1c No results for input(s): HGBA1C in the last 72 hours. Lipid  Profile No results for input(s): CHOL, HDL, LDLCALC, TRIG, CHOLHDL, LDLDIRECT in the last 72 hours. Thyroid function studies No results for input(s): TSH, T4TOTAL, T3FREE, THYROIDAB in the last 72 hours.  Invalid input(s): FREET3 Anemia work up No results for input(s): VITAMINB12, FOLATE, FERRITIN, TIBC, IRON, RETICCTPCT in the last 72 hours. Urinalysis    Component Value Date/Time   COLORURINE YELLOW 10/22/2020 1529   APPEARANCEUR CLEAR 10/22/2020 1529   LABSPEC 1.014 10/22/2020 1529   PHURINE 5.0 10/22/2020 1529   GLUCOSEU 50 (A) 10/22/2020 1529   HGBUR SMALL (A) 10/22/2020 1529   BILIRUBINUR NEGATIVE 10/22/2020 1529   KETONESUR NEGATIVE 10/22/2020 1529   PROTEINUR NEGATIVE 10/22/2020 1529   NITRITE NEGATIVE 10/22/2020 1529   LEUKOCYTESUR NEGATIVE 10/22/2020 1529    Microbiology Recent Results (from the past 240 hour(s))  Resp Panel by RT-PCR (Flu A&B, Covid) Nasopharyngeal Swab     Status: None   Collection Time: 08/27/21  2:40 AM   Specimen: Nasopharyngeal Swab; Nasopharyngeal(NP) swabs in vial transport medium  Result Value Ref Range Status   SARS Coronavirus 2 by RT PCR NEGATIVE NEGATIVE Final    Comment: (NOTE) SARS-CoV-2 target nucleic acids are NOT DETECTED.  The SARS-CoV-2 RNA is generally detectable in upper respiratory specimens during the acute phase of infection. The lowest concentration of SARS-CoV-2 viral copies this assay can detect is 138 copies/mL. A negative result does not preclude SARS-Cov-2 infection and should not be used as the sole basis for treatment or other patient management decisions. A negative result may occur with  improper specimen collection/handling, submission of specimen other than nasopharyngeal swab, presence of viral mutation(s) within the areas targeted by this assay, and inadequate number of viral copies(<138 copies/mL). A negative result must be combined with clinical observations, patient history, and  epidemiological information. The expected result is Negative.  Fact Sheet for Patients:  EntrepreneurPulse.com.au  Fact Sheet for Healthcare Providers:  IncredibleEmployment.be  This test is no t yet approved or cleared by the [place] FDA and  has been authorized for detection and/or diagnosis of SARS-CoV-2 by FDA under an Emergency Use Authorization (EUA). This EUA will remain  in effect (meaning this test can be used) for the duration of the COVID-19 declaration under Section 564(b)(1) of the Act, 21 U.S.C.section 360bbb-3(b)(1), unless the authorization is terminated  or revoked sooner.       Influenza A by PCR NEGATIVE NEGATIVE Final   Influenza B by PCR NEGATIVE NEGATIVE Final    Comment: (NOTE) The Xpert Xpress SARS-CoV-2/FLU/RSV plus assay is intended as an aid in the diagnosis of influenza from Nasopharyngeal swab specimens and should not be used as a sole basis for  treatment. Nasal washings and aspirates are unacceptable for Xpert Xpress SARS-CoV-2/FLU/RSV testing.  Fact Sheet for Patients: EntrepreneurPulse.com.au  Fact Sheet for Healthcare Providers: IncredibleEmployment.be  This test is not yet approved or cleared by the [place] FDA and has been authorized for detection and/or diagnosis of SARS-CoV-2 by FDA under an Emergency Use Authorization (EUA). This EUA will remain in effect (meaning this test can be used) for the duration of the COVID-19 declaration under Section 564(b)(1) of the Act, 21 U.S.C. section 360bbb-3(b)(1), unless the authorization is terminated or revoked.  Performed at [facility], [address] [place], [place] [address]     Time coordinating discharge: Approximately 40 minutes  Patrecia Pour, MD  [facility] 09/01/2021, 11:00 AM

## 2021-09-01 NOTE — Progress Notes (Signed)
Type and Screen and CBC D on 09/03/21 ordered

## 2021-09-01 NOTE — Progress Notes (Signed)
DISCONTINUE ON PATHWAY REGIMEN - Pancreatic Adenocarcinoma     A cycle is every 28 days:     Nab-paclitaxel (protein bound)      Gemcitabine   **Always confirm dose/schedule in your pharmacy ordering system**  REASON: Disease Progression PRIOR TREATMENT: [id number]: Nab-Paclitaxel (Abraxane) 125 mg/m2 D1, 8, 15 + Gemcitabine 1,000 mg/m2 D1, 8, 15 q28 Days Until Progression or Toxicity TREATMENT RESPONSE: Progressive Disease (PD)  START OFF PATHWAY REGIMEN - Pancreatic Adenocarcinoma   [id number]:Fluorouracil 1,200 mg/m2/day CIV D1-2 + Leucovorin 400 mg/m2 IV D1 + Liposomal Irinotecan 70 mg/m2 IV D1 q14 Days:   A cycle is every 14 days:     Liposomal irinotecan      Leucovorin      Fluorouracil   **Always confirm dose/schedule in your pharmacy ordering system**  Patient Characteristics: Metastatic Disease, Third Line and Beyond, MSS/pMMR or MSI Unknown Therapeutic Status: Metastatic Disease Line of Therapy: Third Line and Beyond Microsatellite/Mismatch Repair Status: MSS/pMMR Intent of Therapy: Non-Curative / Palliative Intent, Discussed with Patient 

## 2021-09-02 ENCOUNTER — Other Ambulatory Visit: Payer: Self-pay | Admitting: Oncology

## 2021-09-02 ENCOUNTER — Inpatient Hospital Stay: Payer: Medicare Other | Admitting: Oncology

## 2021-09-02 ENCOUNTER — Telehealth: Payer: Self-pay | Admitting: *Deleted

## 2021-09-02 DIAGNOSIS — C250 Malignant neoplasm of head of pancreas: Secondary | ICD-10-CM

## 2021-09-02 LAB — TYPE AND SCREEN
ABO/RH(D): A POS
Antibody Screen: NEGATIVE
Unit division: 0

## 2021-09-02 LAB — BPAM RBC
Blood Product Expiration Date: 202210312359
ISSUE DATE / TIME: 202210181656
Unit Type and Rh: 6200

## 2021-09-03 ENCOUNTER — Inpatient Hospital Stay: Payer: Medicare Other | Admitting: Nurse Practitioner

## 2021-09-03 ENCOUNTER — Telehealth: Payer: Self-pay | Admitting: *Deleted

## 2021-09-03 ENCOUNTER — Other Ambulatory Visit: Payer: Self-pay

## 2021-09-03 ENCOUNTER — Inpatient Hospital Stay: Payer: Medicare Other

## 2021-09-03 VITALS — BP 109/55 | HR 65 | Temp 98.0°F | Resp 17

## 2021-09-03 DIAGNOSIS — D696 Thrombocytopenia, unspecified: Secondary | ICD-10-CM | POA: Diagnosis not present

## 2021-09-03 DIAGNOSIS — T451X5D Adverse effect of antineoplastic and immunosuppressive drugs, subsequent encounter: Secondary | ICD-10-CM | POA: Diagnosis not present

## 2021-09-03 DIAGNOSIS — Z5189 Encounter for other specified aftercare: Secondary | ICD-10-CM | POA: Diagnosis not present

## 2021-09-03 DIAGNOSIS — Z95828 Presence of other vascular implants and grafts: Secondary | ICD-10-CM

## 2021-09-03 DIAGNOSIS — C259 Malignant neoplasm of pancreas, unspecified: Secondary | ICD-10-CM

## 2021-09-03 DIAGNOSIS — Z79899 Other long term (current) drug therapy: Secondary | ICD-10-CM | POA: Diagnosis not present

## 2021-09-03 DIAGNOSIS — C250 Malignant neoplasm of head of pancreas: Secondary | ICD-10-CM

## 2021-09-03 DIAGNOSIS — Z5111 Encounter for antineoplastic chemotherapy: Secondary | ICD-10-CM | POA: Diagnosis not present

## 2021-09-03 DIAGNOSIS — E119 Type 2 diabetes mellitus without complications: Secondary | ICD-10-CM | POA: Diagnosis not present

## 2021-09-03 DIAGNOSIS — C787 Secondary malignant neoplasm of liver and intrahepatic bile duct: Secondary | ICD-10-CM | POA: Diagnosis present

## 2021-09-03 DIAGNOSIS — D701 Agranulocytosis secondary to cancer chemotherapy: Secondary | ICD-10-CM | POA: Diagnosis not present

## 2021-09-03 LAB — CBC WITH DIFFERENTIAL (CANCER CENTER ONLY)
Abs Immature Granulocytes: 0.03 10*3/uL (ref 0.00–0.07)
Basophils Absolute: 0.1 10*3/uL (ref 0.0–0.1)
Basophils Relative: 1 %
Eosinophils Absolute: 0.4 10*3/uL (ref 0.0–0.5)
Eosinophils Relative: 5 %
HCT: 27.8 % — ABNORMAL LOW (ref 39.0–52.0)
Hemoglobin: 9 g/dL — ABNORMAL LOW (ref 13.0–17.0)
Immature Granulocytes: 0 %
Lymphocytes Relative: 10 %
Lymphs Abs: 0.8 10*3/uL (ref 0.7–4.0)
MCH: 31.8 pg (ref 26.0–34.0)
MCHC: 32.4 g/dL (ref 30.0–36.0)
MCV: 98.2 fL (ref 80.0–100.0)
Monocytes Absolute: 0.4 10*3/uL (ref 0.1–1.0)
Monocytes Relative: 5 %
Neutro Abs: 6.2 10*3/uL (ref 1.7–7.7)
Neutrophils Relative %: 79 %
Platelet Count: 79 10*3/uL — ABNORMAL LOW (ref 150–400)
RBC: 2.83 MIL/uL — ABNORMAL LOW (ref 4.22–5.81)
RDW: 17.2 % — ABNORMAL HIGH (ref 11.5–15.5)
WBC Count: 7.9 10*3/uL (ref 4.0–10.5)
nRBC: 0 % (ref 0.0–0.2)

## 2021-09-03 LAB — SAMPLE TO BLOOD BANK

## 2021-09-03 MED ORDER — SODIUM CHLORIDE 0.9% FLUSH
10.0000 mL | Freq: Once | INTRAVENOUS | Status: AC
  Administered 2021-09-03: 10 mL via INTRAVENOUS

## 2021-09-03 MED ORDER — HEPARIN SOD (PORK) LOCK FLUSH 100 UNIT/ML IV SOLN
500.0000 [IU] | Freq: Once | INTRAVENOUS | Status: AC
  Administered 2021-09-03: 500 [IU] via INTRAVENOUS

## 2021-09-03 NOTE — Telephone Encounter (Signed)
CBC reviewed by NP. Called patient and left VM that hgb is improved and platelet count is down to 79, but no intervention needed at this time. F/U on 10/25 as scheduled w/arrival at 0730.

## 2021-09-04 ENCOUNTER — Inpatient Hospital Stay: Payer: Medicare Other

## 2021-09-04 NOTE — Progress Notes (Signed)
Pharmacist Chemotherapy Monitoring - Initial Assessment    Anticipated start date: 09/08/21   The following has been reviewed per standard work regarding the patient's treatment regimen: The patient's diagnosis, treatment plan and drug doses, and organ/hematologic function Lab orders and baseline tests specific to treatment regimen  The treatment plan start date, drug sequencing, and pre-medications Prior authorization status  Patient's documented medication list, including drug-drug interaction screen and prescriptions for anti-emetics and supportive care specific to the treatment regimen The drug concentrations, fluid compatibility, administration routes, and timing of the medications to be used The patient's access for treatment and lifetime cumulative dose history, if applicable  The patient's medication allergies and previous infusion related reactions, if applicable   Changes made to treatment plan:  N/A  Follow up needed:  N/A   Patrica Duel, [facility], 09/04/2021  10:47 AM

## 2021-09-06 ENCOUNTER — Other Ambulatory Visit: Payer: Self-pay | Admitting: Oncology

## 2021-09-08 ENCOUNTER — Inpatient Hospital Stay: Payer: Medicare Other

## 2021-09-08 ENCOUNTER — Other Ambulatory Visit: Payer: Self-pay

## 2021-09-08 ENCOUNTER — Inpatient Hospital Stay (HOSPITAL_BASED_OUTPATIENT_CLINIC_OR_DEPARTMENT_OTHER): Payer: Medicare Other | Admitting: Oncology

## 2021-09-08 ENCOUNTER — Encounter: Payer: Self-pay | Admitting: Nurse Practitioner

## 2021-09-08 ENCOUNTER — Other Ambulatory Visit: Payer: Self-pay | Admitting: *Deleted

## 2021-09-08 VITALS — BP 128/70 | HR 67 | Temp 98.1°F | Resp 20 | Ht 68.0 in | Wt 135.2 lb

## 2021-09-08 DIAGNOSIS — C250 Malignant neoplasm of head of pancreas: Secondary | ICD-10-CM | POA: Diagnosis not present

## 2021-09-08 DIAGNOSIS — C259 Malignant neoplasm of pancreas, unspecified: Secondary | ICD-10-CM

## 2021-09-08 DIAGNOSIS — D696 Thrombocytopenia, unspecified: Secondary | ICD-10-CM

## 2021-09-08 DIAGNOSIS — Z5111 Encounter for antineoplastic chemotherapy: Secondary | ICD-10-CM | POA: Diagnosis not present

## 2021-09-08 DIAGNOSIS — Z95828 Presence of other vascular implants and grafts: Secondary | ICD-10-CM

## 2021-09-08 LAB — CBC WITH DIFFERENTIAL (CANCER CENTER ONLY)
Abs Immature Granulocytes: 0.01 10*3/uL (ref 0.00–0.07)
Basophils Absolute: 0.1 10*3/uL (ref 0.0–0.1)
Basophils Relative: 1 %
Eosinophils Absolute: 0.5 10*3/uL (ref 0.0–0.5)
Eosinophils Relative: 10 %
HCT: 25.4 % — ABNORMAL LOW (ref 39.0–52.0)
Hemoglobin: 8 g/dL — ABNORMAL LOW (ref 13.0–17.0)
Immature Granulocytes: 0 %
Lymphocytes Relative: 14 %
Lymphs Abs: 0.7 10*3/uL (ref 0.7–4.0)
MCH: 31.5 pg (ref 26.0–34.0)
MCHC: 31.5 g/dL (ref 30.0–36.0)
MCV: 100 fL (ref 80.0–100.0)
Monocytes Absolute: 0.3 10*3/uL (ref 0.1–1.0)
Monocytes Relative: 6 %
Neutro Abs: 3.5 10*3/uL (ref 1.7–7.7)
Neutrophils Relative %: 69 %
Platelet Count: 60 10*3/uL — ABNORMAL LOW (ref 150–400)
RBC: 2.54 MIL/uL — ABNORMAL LOW (ref 4.22–5.81)
RDW: 15.8 % — ABNORMAL HIGH (ref 11.5–15.5)
WBC Count: 5.1 10*3/uL (ref 4.0–10.5)
nRBC: 0 % (ref 0.0–0.2)

## 2021-09-08 LAB — CMP (CANCER CENTER ONLY)
ALT: 52 U/L — ABNORMAL HIGH (ref 0–44)
AST: 36 U/L (ref 15–41)
Albumin: 3.7 g/dL (ref 3.5–5.0)
Alkaline Phosphatase: 262 U/L — ABNORMAL HIGH (ref 38–126)
Anion gap: 9 (ref 5–15)
BUN: 23 mg/dL (ref 8–23)
CO2: 17 mmol/L — ABNORMAL LOW (ref 22–32)
Calcium: 8.6 mg/dL — ABNORMAL LOW (ref 8.9–10.3)
Chloride: 113 mmol/L — ABNORMAL HIGH (ref 98–111)
Creatinine: 1.16 mg/dL (ref 0.61–1.24)
GFR, Estimated: >60 mL/min (ref >60)
Glucose, Bld: 208 mg/dL — ABNORMAL HIGH (ref 70–99)
Potassium: 3.8 mmol/L (ref 3.5–5.1)
Sodium: 139 mmol/L (ref 135–145)
Total Bilirubin: 0.8 mg/dL (ref 0.3–1.2)
Total Protein: 6.3 g/dL — ABNORMAL LOW (ref 6.5–8.1)

## 2021-09-08 LAB — SAMPLE TO BLOOD BANK

## 2021-09-08 MED ORDER — SODIUM CHLORIDE 0.9 % IV SOLN
Freq: Once | INTRAVENOUS | Status: AC
Start: 2021-09-08 — End: 2021-09-08

## 2021-09-08 MED ORDER — SODIUM CHLORIDE 0.9 % IV SOLN
1800.0000 mg/m2 | INTRAVENOUS | Status: DC
  Administered 2021-09-08: 3050 mg via INTRAVENOUS
  Filled 2021-09-08: qty 61

## 2021-09-08 MED ORDER — SODIUM CHLORIDE 0.9 % IV SOLN
300.0000 mg/m2 | Freq: Once | INTRAVENOUS | Status: AC
  Administered 2021-09-08: 508 mg via INTRAVENOUS
  Filled 2021-09-08: qty 25.4

## 2021-09-08 MED ORDER — PALONOSETRON HCL INJECTION 0.25 MG/5ML
0.2500 mg | Freq: Once | INTRAVENOUS | Status: AC
  Administered 2021-09-08: 0.25 mg via INTRAVENOUS
  Filled 2021-09-08: qty 5

## 2021-09-08 MED ORDER — SODIUM CHLORIDE 0.9 % IV SOLN
10.0000 mg | Freq: Once | INTRAVENOUS | Status: AC
  Administered 2021-09-08: 10 mg via INTRAVENOUS
  Filled 2021-09-08: qty 10

## 2021-09-08 MED ORDER — SODIUM CHLORIDE 0.9 % IV SOLN
69.0000 mg | Freq: Once | INTRAVENOUS | Status: AC
  Administered 2021-09-08: 69 mg via INTRAVENOUS
  Filled 2021-09-08: qty 16.05

## 2021-09-08 MED ORDER — SODIUM CHLORIDE 0.9% FLUSH
10.0000 mL | INTRAVENOUS | Status: DC | PRN
  Administered 2021-09-08: 10 mL

## 2021-09-08 MED ORDER — ROMIPLOSTIM 125 MCG ~~LOC~~ SOLR
2.0000 ug/kg | Freq: Once | SUBCUTANEOUS | Status: AC
Start: 2021-09-08 — End: 2021-09-08
  Administered 2021-09-08: 125 ug via SUBCUTANEOUS
  Filled 2021-09-08: qty 0.25

## 2021-09-08 MED ORDER — DIPHENOXYLATE-ATROPINE 2.5-0.025 MG PO TABS: 1.0000 | ORAL_TABLET | Freq: Four times a day (QID) | ORAL | 1 refills | Status: DC | PRN

## 2021-09-08 NOTE — Progress Notes (Signed)
Orders per phlebotomy

## 2021-09-08 NOTE — Patient Instructions (Signed)
[facility]  Discharge Instructions: Thank you for choosing [facility] to provide your oncology and hematology care.   If you have a lab appointment with the [facility], please go directly to the [facility] and check in at the registration area.   Wear comfortable clothing and clothing appropriate for easy access to any Portacath or PICC line.   We strive to give you quality time with your provider. You may need to reschedule your appointment if you arrive late (15 or more minutes).  Arriving late affects you and other patients whose appointments are after yours.  Also, if you miss three or more appointments without notifying the office, you may be dismissed from the clinic at the provider's discretion.      For prescription refill requests, have your pharmacy contact our office and allow 72 hours for refills to be completed.    Today you received the following chemotherapy and/or immunotherapy agents: irinotecan liposomal, leucovorin, fluorouracil.       To help prevent nausea and vomiting after your treatment, we encourage you to take your nausea medication as directed.  BELOW ARE SYMPTOMS THAT SHOULD BE REPORTED IMMEDIATELY: *FEVER GREATER THAN 100.4 F (38 C) OR HIGHER *CHILLS OR SWEATING *NAUSEA AND VOMITING THAT IS NOT CONTROLLED WITH YOUR NAUSEA MEDICATION *UNUSUAL SHORTNESS OF BREATH *UNUSUAL BRUISING OR BLEEDING *URINARY PROBLEMS (pain or burning when urinating, or frequent urination) *BOWEL PROBLEMS (unusual diarrhea, constipation, pain near the anus) TENDERNESS IN MOUTH AND THROAT WITH OR WITHOUT PRESENCE OF ULCERS (sore throat, sores in mouth, or a toothache) UNUSUAL RASH, SWELLING OR PAIN  UNUSUAL VAGINAL DISCHARGE OR ITCHING   Items with * indicate a potential emergency and should be followed up as soon as possible or go to the Emergency Department if any problems should occur.  Please show the CHEMOTHERAPY ALERT CARD or  IMMUNOTHERAPY ALERT CARD at check-in to the Emergency Department and triage nurse.  Should you have questions after your visit or need to cancel or reschedule your appointment, please contact [facility]  Dept: [phone]  and follow the prompts.  Office hours are 8:00 a.m. to 4:30 p.m. Monday - Friday. Please note that voicemails left after 4:00 p.m. may not be returned until the following business day.  We are closed weekends and major holidays. You have access to a nurse at all times for urgent questions. Please call the main number to the clinic Dept: [phone] and follow the prompts.   For any non-urgent questions, you may also contact your provider using MyChart. We now offer e-Visits for anyone 61 and older to request care online for non-urgent symptoms. For details visit [email or web address].   Also download the MyChart app! Go to the app store, search "MyChart", open the app, select [facility], and log in with your MyChart username and password.  Due to Covid, a mask is required upon entering the hospital/clinic. If you do not have a mask, one will be given to you upon arrival. For doctor visits, patients may have 1 support person aged 64 or older with them. For treatment visits, patients cannot have anyone with them due to current Covid guidelines and our immunocompromised population.   Irinotecan Liposomal injection What is this medication? IRINOTECAN LIPOSOME (eye ri noe TEE kan LIP oh som) is a chemotherapy drug. It is used to treat pancreatic cancer. This medicine may be used for other purposes; ask your health care provider or pharmacist if you  have questions. COMMON BRAND NAME(S): ONIVYDE What should I tell my care team before I take this medication? They need to know if you have any of these conditions: bleeding disorders dehydration history of blood diseases, like sickle cell anemia or leukemia history of low levels of calcium, magnesium, or  potassium in the blood infection (especially a virus infection such as chickenpox, cold sores, or herpes) liver disease low blood counts, like low white cell, platelet, or red cell counts lung or breathing disease, like asthma recent or ongoing radiation therapy an unusual or allergic reaction to irinotecan liposome, other medicines, foods, dyes, or preservatives pregnant or trying to get pregnant breast-feeding How should I use this medication? This drug is given as an infusion into a vein. It is administered in a hospital or clinic by a specially trained health care professional. Talk to your pediatrician regarding the use of this medicine in children. Special care may be needed. Overdosage: If you think you have taken too much of this medicine contact a poison control center or emergency room at once. NOTE: This medicine is only for you. Do not share this medicine with others. What if I miss a dose? It is important not to miss your dose. Call your doctor or health care professional if you are unable to keep an appointment. What may interact with this medication? This medicine may interact with the following medications: antiviral medicines for HIV or AIDS certain medications for fungal infections like ketoconazole, itraconazole, voriconazole certain medications for seizures like carbamazepine, fosphenytoin, phenytoin clarithromycin gemfibrozil mephobarbital nefazodone phenobarbital primidone rifabutin rifampin rifapentine [facility] John's Wort telaprevir This list may not describe all possible interactions. Give your health care provider a list of all the medicines, herbs, non-prescription drugs, or dietary supplements you use. Also tell them if you smoke, drink alcohol, or use illegal drugs. Some items may interact with your medicine. What should I watch for while using this medication? Check with your doctor or health care professional if you get an attack of severe diarrhea, nausea  and vomiting, or if you sweat a lot. The loss of too much body fluid can make it dangerous for you to take this medicine. This medicine may interfere with the ability to have a child. You should talk with your doctor or health care professional if you are concerned about your fertility. Do not become pregnant while taking this medicine or for 1 month after the last dose; males with male partners should use condoms during treatment and for 4 months after the last dose. Women should inform their doctor if they wish to become pregnant or think they might be pregnant. There is a potential for serious side effects to an unborn child. Talk to your health care professional or pharmacist for more information. Do not breast-feed an infant while taking this medicine or for 1 month after the last dose. Avoid taking products that contain aspirin, acetaminophen, ibuprofen, naproxen, or ketoprofen unless instructed by your doctor. These medicines may hide a fever. Be careful brushing and flossing your teeth or using a toothpick because you may get an infection or bleed more easily. If you have any dental work done, tell your dentist you are receiving this medicine. Call your doctor or health care professional for advice if you get a fever, chills or sore throat, or other symptoms of a cold or flu. Do not treat yourself. This drug decreases your body's ability to fight infections. Try to avoid being around people who are sick. This medicine  may increase your risk to bruise or bleed. Call your doctor or health care professional if you notice any unusual bleeding. This drug may make you feel generally unwell. This is not uncommon, as chemotherapy can affect healthy cells as well as cancer cells. Report any side effects. Continue your course of treatment even though you feel ill unless your doctor tells you to stop. What side effects may I notice from receiving this medication? Side effects that you should report to your  doctor or health care professional as soon as possible: allergic reactions like skin rash, itching or hives, swelling of the face, lips, or tongue breathing problems cough diarrhea low blood counts - this medicine may decrease the number of white blood cells, red blood cells and platelets. You may be at increased risk for infections and bleeding nausea, vomiting signs and symptoms of bleeding such as bloody or black, tarry stools; red or dark-brown urine; spitting up blood or brown material that looks like coffee grounds; red spots on the skin; unusual bruising or bleeding from the eye, gums, or nose signs of decreased red blood cells - unusually weak or tired, feeling faint or lightheaded, falls signs and symptoms of infection like fever or chills; cough; sore throat; pain or trouble passing urine signs and symptoms of liver injury like dark yellow or brown urine; general ill feeling or flu-like symptoms; light-colored stools; loss of appetite; nausea; right upper belly pain; unusually weak or tired; yellowing of the eyes or skin stomach pain Side effects that usually do not require medical attention (report to your doctor or health care professional if they continue or are bothersome): hair loss loss of appetite mouth sores upset stomach This list may not describe all possible side effects. Call your doctor for medical advice about side effects. You may report side effects to FDA at 1-800-FDA-1088. Where should I keep my medication? This drug is given in a hospital or clinic and will not be stored at home. NOTE: This sheet is a summary. It may not cover all possible information. If you have questions about this medicine, talk to your doctor, pharmacist, or health care provider.  2022 Elsevier/Gold Standard (2015-12-04 10:14:31)  Leucovorin injection What is this medication? LEUCOVORIN (loo koe VOR in) is used to prevent or treat the harmful effects of some medicines. This medicine is used  to treat anemia caused by a low amount of folic acid in the body. It is also used with 5-fluorouracil (5-FU) to treat colon cancer. This medicine may be used for other purposes; ask your health care provider or pharmacist if you have questions. What should I tell my care team before I take this medication? They need to know if you have any of these conditions: anemia from low levels of vitamin B-12 in the blood an unusual or allergic reaction to leucovorin, folic acid, other medicines, foods, dyes, or preservatives pregnant or trying to get pregnant breast-feeding How should I use this medication? This medicine is for injection into a muscle or into a vein. It is given by a health care professional in a hospital or clinic setting. Talk to your pediatrician regarding the use of this medicine in children. Special care may be needed. Overdosage: If you think you have taken too much of this medicine contact a poison control center or emergency room at once. NOTE: This medicine is only for you. Do not share this medicine with others. What if I miss a dose? This does not apply. What may interact  with this medication? capecitabine fluorouracil phenobarbital phenytoin primidone trimethoprim-sulfamethoxazole This list may not describe all possible interactions. Give your health care provider a list of all the medicines, herbs, non-prescription drugs, or dietary supplements you use. Also tell them if you smoke, drink alcohol, or use illegal drugs. Some items may interact with your medicine. What should I watch for while using this medication? Your condition will be monitored carefully while you are receiving this medicine. This medicine may increase the side effects of 5-fluorouracil, 5-FU. Tell your doctor or health care professional if you have diarrhea or mouth sores that do not get better or that get worse. What side effects may I notice from receiving this medication? Side effects that you  should report to your doctor or health care professional as soon as possible: allergic reactions like skin rash, itching or hives, swelling of the face, lips, or tongue breathing problems fever, infection mouth sores unusual bleeding or bruising unusually weak or tired Side effects that usually do not require medical attention (report to your doctor or health care professional if they continue or are bothersome): constipation or diarrhea loss of appetite nausea, vomiting This list may not describe all possible side effects. Call your doctor for medical advice about side effects. You may report side effects to FDA at 1-800-FDA-1088. Where should I keep my medication? This drug is given in a hospital or clinic and will not be stored at home. NOTE: This sheet is a summary. It may not cover all possible information. If you have questions about this medicine, talk to your doctor, pharmacist, or health care provider.  2022 Elsevier/Gold Standard (2008-05-07 16:50:29)  Fluorouracil, 5-FU injection What is this medication? FLUOROURACIL, 5-FU (flure oh YOOR a sil) is a chemotherapy drug. It slows the growth of cancer cells. This medicine is used to treat many types of cancer like breast cancer, colon or rectal cancer, pancreatic cancer, and stomach cancer. This medicine may be used for other purposes; ask your health care provider or pharmacist if you have questions. COMMON BRAND NAME(S): Adrucil What should I tell my care team before I take this medication? They need to know if you have any of these conditions: blood disorders dihydropyrimidine dehydrogenase (DPD) deficiency infection (especially a virus infection such as chickenpox, cold sores, or herpes) kidney disease liver disease malnourished, poor nutrition recent or ongoing radiation therapy an unusual or allergic reaction to fluorouracil, other chemotherapy, other medicines, foods, dyes, or preservatives pregnant or trying to get  pregnant breast-feeding How should I use this medication? This drug is given as an infusion or injection into a vein. It is administered in a hospital or clinic by a specially trained health care professional. Talk to your pediatrician regarding the use of this medicine in children. Special care may be needed. Overdosage: If you think you have taken too much of this medicine contact a poison control center or emergency room at once. NOTE: This medicine is only for you. Do not share this medicine with others. What if I miss a dose? It is important not to miss your dose. Call your doctor or health care professional if you are unable to keep an appointment. What may interact with this medication? Do not take this medicine with any of the following medications: live virus vaccines This medicine may also interact with the following medications: medicines that treat or prevent blood clots like warfarin, enoxaparin, and dalteparin This list may not describe all possible interactions. Give your health care provider a list of  all the medicines, herbs, non-prescription drugs, or dietary supplements you use. Also tell them if you smoke, drink alcohol, or use illegal drugs. Some items may interact with your medicine. What should I watch for while using this medication? Visit your doctor for checks on your progress. This drug may make you feel generally unwell. This is not uncommon, as chemotherapy can affect healthy cells as well as cancer cells. Report any side effects. Continue your course of treatment even though you feel ill unless your doctor tells you to stop. In some cases, you may be given additional medicines to help with side effects. Follow all directions for their use. Call your doctor or health care professional for advice if you get a fever, chills or sore throat, or other symptoms of a cold or flu. Do not treat yourself. This drug decreases your body's ability to fight infections. Try to avoid  being around people who are sick. This medicine may increase your risk to bruise or bleed. Call your doctor or health care professional if you notice any unusual bleeding. Be careful brushing and flossing your teeth or using a toothpick because you may get an infection or bleed more easily. If you have any dental work done, tell your dentist you are receiving this medicine. Avoid taking products that contain aspirin, acetaminophen, ibuprofen, naproxen, or ketoprofen unless instructed by your doctor. These medicines may hide a fever. Do not become pregnant while taking this medicine. Women should inform their doctor if they wish to become pregnant or think they might be pregnant. There is a potential for serious side effects to an unborn child. Talk to your health care professional or pharmacist for more information. Do not breast-feed an infant while taking this medicine. Men should inform their doctor if they wish to father a child. This medicine may lower sperm counts. Do not treat diarrhea with over the counter products. Contact your doctor if you have diarrhea that lasts more than 2 days or if it is severe and watery. This medicine can make you more sensitive to the sun. Keep out of the sun. If you cannot avoid being in the sun, wear protective clothing and use sunscreen. Do not use sun lamps or tanning beds/booths. What side effects may I notice from receiving this medication? Side effects that you should report to your doctor or health care professional as soon as possible: allergic reactions like skin rash, itching or hives, swelling of the face, lips, or tongue low blood counts - this medicine may decrease the number of white blood cells, red blood cells and platelets. You may be at increased risk for infections and bleeding. signs of infection - fever or chills, cough, sore throat, pain or difficulty passing urine signs of decreased platelets or bleeding - bruising, pinpoint red spots on the  skin, black, tarry stools, blood in the urine signs of decreased red blood cells - unusually weak or tired, fainting spells, lightheadedness breathing problems changes in vision chest pain mouth sores nausea and vomiting pain, swelling, redness at site where injected pain, tingling, numbness in the hands or feet redness, swelling, or sores on hands or feet stomach pain unusual bleeding Side effects that usually do not require medical attention (report to your doctor or health care professional if they continue or are bothersome): changes in finger or toe nails diarrhea dry or itchy skin hair loss headache loss of appetite sensitivity of eyes to the light stomach upset unusually teary eyes This list may not describe all  possible side effects. Call your doctor for medical advice about side effects. You may report side effects to FDA at 1-800-FDA-1088. Where should I keep my medication? This drug is given in a hospital or clinic and will not be stored at home. NOTE: This sheet is a summary. It may not cover all possible information. If you have questions about this medicine, talk to your doctor, pharmacist, or health care provider.  2022 Elsevier/Gold Standard (2019-10-02 15:00:03)  Romiplostim injection What is this medication? ROMIPLOSTIM (roe mi PLOE stim) helps your body make more platelets. This medicine is used to treat low platelets caused by chronic idiopathic thrombocytopenic purpura (ITP) or a bone marrow syndrome caused by radiation sickness. This medicine may be used for other purposes; ask your health care provider or pharmacist if you have questions. COMMON BRAND NAME(S): Nplate What should I tell my care team before I take this medication? They need to know if you have any of these conditions: blood clots myelodysplastic syndrome an unusual or allergic reaction to romiplostim, mannitol, other medicines, foods, dyes, or preservatives pregnant or trying to get  pregnant breast-feeding How should I use this medication? This medicine is injected under the skin. It is given by a health care provider in a hospital or clinic setting. A special MedGuide will be given to you before each treatment. Be sure to read this information carefully each time. Talk to your health care provider about the use of this medicine in children. While it may be prescribed for children as young as newborns for selected conditions, precautions do apply. Overdosage: If you think you have taken too much of this medicine contact a poison control center or emergency room at once. NOTE: This medicine is only for you. Do not share this medicine with others. What if I miss a dose? Keep appointments for follow-up doses. It is important not to miss your dose. Call your health care provider if you are unable to keep an appointment. What may interact with this medication? Interactions are not expected. This list may not describe all possible interactions. Give your health care provider a list of all the medicines, herbs, non-prescription drugs, or dietary supplements you use. Also tell them if you smoke, drink alcohol, or use illegal drugs. Some items may interact with your medicine. What should I watch for while using this medication? Visit your health care provider for regular checks on your progress. You may need blood work done while you are taking this medicine. Your condition will be monitored carefully while you are receiving this medicine. It is important not to miss any appointments. What side effects may I notice from receiving this medication? Side effects that you should report to your doctor or health care professional as soon as possible: allergic reactions (skin rash, itching or hives; swelling of the face, lips, or tongue) bleeding (bloody or black, tarry stools; red or dark brown urine; spitting up blood or brown material that looks like coffee grounds; red spots on the skin;  unusual bruising or bleeding from the eyes, gums, or nose) blood clot (chest pain; shortness of breath; pain, swelling, or warmth in the leg) stroke (changes in vision; confusion; trouble speaking or understanding; severe headaches; sudden numbness or weakness of the face, arm or leg; trouble walking; dizziness; loss of balance or coordination) Side effects that usually do not require medical attention (report to your doctor or health care professional if they continue or are bothersome): diarrhea dizziness headache joint pain muscle pain stomach pain  trouble sleeping This list may not describe all possible side effects. Call your doctor for medical advice about side effects. You may report side effects to FDA at 1-800-FDA-1088. Where should I keep my medication? This medicine is given in a hospital or clinic. It will not be stored at home. NOTE: This sheet is a summary. It may not cover all possible information. If you have questions about this medicine, talk to your doctor, pharmacist, or health care provider.  2022 Elsevier/Gold Standard (2019-12-17 10:28:13)

## 2021-09-08 NOTE — Progress Notes (Signed)
[facility] OFFICE PROGRESS NOTE   Diagnosis: Pancreas cancer  INTERVAL HISTORY:   Mr. Perleberg was discharged from the hospital on 09/01/2021 after admission with GI bleeding.  He reports 1 episode of rectal bleeding since discharge from the hospital.  He has returned to working.  No new complaint.  Objective:  Vital signs in last 24 hours:  Blood pressure 128/70, pulse 67, temperature 98.1 F (36.7 C), temperature source Oral, resp. rate 20, height _0  (1.727 m), weight 135 lb 3.2 oz (61.3 kg), SpO2 100 %.    HEENT: No thrush or ulcers Resp: Lungs clear bilaterally Cardio: Regular rate and rhythm GI: No hepatosplenomegaly, no mass, nontender Vascular: No leg edema   Portacath/PICC-without erythema  Lab Results:  Lab Results  Component Value Date   WBC 5.1 09/08/2021   HGB 8.0 (L) 09/08/2021   HCT 25.4 (L) 09/08/2021   MCV 100.0 09/08/2021   PLT 60 (L) 09/08/2021   NEUTROABS 3.5 09/08/2021    CMP  Lab Results  Component Value Date   NA 139 09/08/2021   K 3.8 09/08/2021   CL 113 (H) 09/08/2021   CO2 17 (L) 09/08/2021   GLUCOSE 208 (H) 09/08/2021   BUN 23 09/08/2021   CREATININE 1.16 09/08/2021   CALCIUM 8.6 (L) 09/08/2021   PROT 6.3 (L) 09/08/2021   ALBUMIN 3.7 09/08/2021   AST 36 09/08/2021   ALT 52 (H) 09/08/2021   ALKPHOS 262 (H) 09/08/2021   BILITOT 0.8 09/08/2021   GFRNONAA >60 09/08/2021   GFRAA NOT CALCULATED 02/23/2021    Lab Results  Component Value Date   ZRA076 3,411 (H) 08/20/2021     Medications: I have reviewed the patient's current medications.   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, nonocclusive  thrombus within the main portal vein, mild biliary ductal dilatation, diffuse hypodense/hypoenhancing areas within the liver (edema versus infiltrating neoplasm), 2 x 1.7 cm area of focal prominence in the pancreas EUS 07/10/2020-no pancreas mass identified, 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, 1.5 cm aortocaval node biopsy-scant lymphoid material, no malignancy Diagnostic laparoscopy 07/17/2020-segment 3 and 4 liver nodules, adenocarcinoma, positive for pankeratin, CK7 and CK20, partially positive for TTF-1, negative for CDX2 Foundation 1-microsatellite stable, tumor mutation burden 4, K-ras G12D, subclonal RB1 alteration Elevated CA 19-9 Cycle 1 FOLFOX 07/28/2020 Cycle 2 FOLFOX 08/18/2020, oxaliplatin dose reduced due to thrombocytopenia Cycle 3 FOLFOX 09/08/2020 Cycle 4 FOLFOX 09/29/2020  Cycle 5 FOLFOX 10/20/2020 CTs 11/06/2020-grossly stable infiltrative mass within the pancreatic head and porta hepatis.  New small low-density liver lesions.  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 Cycle 6 FOLFOX 11/10/2021  Cycle 7 FOLFOX 12/01/2020 MRI liver 12/04/2020-no significant change in posttreatment appearance of the pancreatic head.  Numerous intrinsically low signal hypoenhancing lesions of the liver parenchyma predominantly observed in the right lobe of the liver and liver dome, measuring no greater than 8 mm and as seen on recent prior CT of the abdomen/pelvis. Cycle 8 FOLFOX 12/22/2020 Cycle 9 FOLFOX 01/12/2021 Cycle 10 FOLFOX 02/02/2021 MRI liver 02/19/2021-no change in pancreas head mass, multiple hypoenhancing liver lesions are new and increased in size,  effacement of portal vein by pancreas head mass with cavernous transformation, no change in mild splenomegaly Cycle 1 gemcitabine/Abraxane 03/12/2021 Cycle 2 gemcitabine/Abraxane 03/27/2021- dose reductions secondary  to neutropenia Cycle 3 gemcitabine/Abraxane 04/17/2021-dose reductions due to thrombocytopenia, white cell growth factor support added Cycle 4 gemcitabine/Abraxane 05/01/2021, Ziextenzo Cycle 5 gemcitabine/Abraxane 05/15/2021, Ziextenzo MRI abdomen 05/26/2021-mild enlargement of dominant hepatic metastases, appearance of infiltrative pancreas mass extending to the celiac trunk and SMA, splenic vein occlusion, cavernous transformation of the portal vein with upper abdomen collaterals, splenomegaly, mild upper abdominal ascites and mesenteric edema, minimal subpleural nodularity left lower lobe Cycle 6 gemcitabine/Abraxane 05/28/2021 Cycle 7 gemcitabine/Abraxane 06/12/2021 Cycle 8 gemcitabine/Abraxane 06/26/2021 Cycle 9 gemcitabine/Abraxane 07/10/2021 Cycle 10 gemcitabine/Abraxane 07/23/2021 Cycle 11 gemcitabine/Abraxane 08/06/2021 MRI abdomen 08/20/2021-multifocal nodularity in the lower chest with a new discrete right lower lobe nodule, enlarging subsegment 7/8 liver lesion, other small lesions appear stable, gastric varices, review of images with radiology-enlargement of at least 2 liver lesions, suspicious nodule n the right lower lung Cycle one 5-FU/liposomal irinotecan 09/08/2021   Chronic thrombocytopenia- weekly Nplate beginning 3/52/4818 Diabetes Hypertension Abdomen/back pain secondary to #1 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 Hedglin appears stable.  He will complete cycle one 5-FU/liposomal irinotecan today.  We  reviewed potential toxicities associated with this regimen including the chance of hematologic toxicity and diarrhea.  He agrees to proceed.  The platelet count is low today.  He has responded to Nplate in the past.  I further dose reduce the irinotecan and he will receive Nplate today.  The hemoglobin is low.  He will have a repeat CBC on 09/10/2021.  We will arrange for transfusion support as needed.  Mr. Funke will continue weekly Nplate.  He will return for an office visit and chemotherapy in 2 weeks.   Betsy Coder, MD  09/08/2021  8:49 AM

## 2021-09-08 NOTE — Progress Notes (Signed)
Patient presents for treatment. RN assessment completed along with the following:  Labs/vitals reviewed - Yes, and ok to treat given in physician note for today. Patient to receive Nplate    Weight within 10% of previous measurement - yes Oncology Treatment Attestation completed for current therapy- Yes, on date 09/01/21 Informed consent completed and reflects current therapy/intent - Yes, on date 09/08/21             Provider progress note reviewed - Yes, today's provider note was reviewed. Treatment/Antibody/Supportive plan reviewed - Yes, and Per Dr. Benay Spice, irinotecan liposomal will be dose reduced. S&H and other orders reviewed - Yes, and there are no additional orders identified. Previous treatment date reviewed - Yes, and the appropriate amount of time has elapsed between treatments.  Patient to proceed with treatment.

## 2021-09-08 NOTE — Patient Instructions (Signed)
Implanted Port Home Guide An implanted port is a device that is placed under the skin. It is usually placed in the chest. The device can be used to give IV medicine, to take blood, or for dialysis. You may have an implanted port if: You need IV medicine that would be irritating to the small veins in your hands or arms. You need IV medicines, such as antibiotics, for a long period of time. You need IV nutrition for a long period of time. You need dialysis. When you have a port, your health care provider can choose to use the port instead of veins in your arms for these procedures. You may have fewer limitations when using a port than you would if you used other types of long-term IVs, and you will likely be able to return to normal activities after your incision heals. An implanted port has two main parts: Reservoir. The reservoir is the part where a needle is inserted to give medicines or draw blood. The reservoir is round. After it is placed, it appears as a small, raised area under your skin. Catheter. The catheter is a thin, flexible tube that connects the reservoir to a vein. Medicine that is inserted into the reservoir goes into the catheter and then into the vein. How is my port accessed? To access your port: A numbing cream may be placed on the skin over the port site. Your health care provider will put on a mask and sterile gloves. The skin over your port will be cleaned carefully with a germ-killing soap and allowed to dry. Your health care provider will gently pinch the port and insert a needle into it. Your health care provider will check for a blood return to make sure the port is in the vein and is not clogged. If your port needs to remain accessed to get medicine continuously (constant infusion), your health care provider will place a clear bandage (dressing) over the needle site. The dressing and needle will need to be changed every week, or as told by your health care provider. What  is flushing? Flushing helps keep the port from getting clogged. Follow instructions from your health care provider about how and when to flush the port. Ports are usually flushed with saline solution or a medicine called heparin. The need for flushing will depend on how the port is used: If the port is only used from time to time to give medicines or draw blood, the port may need to be flushed: Before and after medicines have been given. Before and after blood has been drawn. As part of routine maintenance. Flushing may be recommended every 4-6 weeks. If a constant infusion is running, the port may not need to be flushed. Throw away any syringes in a disposal container that is meant for sharp items (sharps container). You can buy a sharps container from a pharmacy, or you can make one by using an empty hard plastic bottle with a cover. How long will my port stay implanted? The port can stay in for as long as your health care provider thinks it is needed. When it is time for the port to come out, a surgery will be done to remove it. The surgery will be similar to the procedure that was done to put the port in. Follow these instructions at home:  Flush your port as told by your health care provider. If you need an infusion over several days, follow instructions from your health care provider about how   to take care of your port site. Make sure you: Wash your hands with soap and water before you change your dressing. If soap and water are not available, use alcohol-based hand sanitizer. Change your dressing as told by your health care provider. Place any used dressings or infusion bags into a plastic bag. Throw that bag in the trash. Keep the dressing that covers the needle clean and dry. Do not get it wet. Do not use scissors or sharp objects near the tube. Keep the tube clamped, unless it is being used. Check your port site every day for signs of infection. Check for: Redness, swelling, or  pain. Fluid or blood. Pus or a bad smell. Protect the skin around the port site. Avoid wearing bra straps that rub or irritate the site. Protect the skin around your port from seat belts. Place a soft pad over your chest if needed. Bathe or shower as told by your health care provider. The site may get wet as long as you are not actively receiving an infusion. Return to your normal activities as told by your health care provider. Ask your health care provider what activities are safe for you. Carry a medical alert card or wear a medical alert bracelet at all times. This will let health care providers know that you have an implanted port in case of an emergency. Get help right away if: You have redness, swelling, or pain at the port site. You have fluid or blood coming from your port site. You have pus or a bad smell coming from the port site. You have a fever. Summary Implanted ports are usually placed in the chest for long-term IV access. Follow instructions from your health care provider about flushing the port and changing bandages (dressings). Take care of the area around your port by avoiding clothing that puts pressure on the area, and by watching for signs of infection. Protect the skin around your port from seat belts. Place a soft pad over your chest if needed. Get help right away if you have a fever or you have redness, swelling, pain, drainage, or a bad smell at the port site. This information is not intended to replace advice given to you by your health care provider. Make sure you discuss any questions you have with your health care provider. Document Revised: 01/21/2021 Document Reviewed: 03/17/2020 Elsevier Patient Education  2022 Elsevier Inc.  

## 2021-09-08 NOTE — Progress Notes (Signed)
Patient seen by Dr. Benay Spice today  Vitals are within treatment parameters  Labs reviewed by Dr. Benay Spice and are not all within treatment parameters. Will give Nplate today and chemo w/dose reductions. Will repeat blood bank on 09/10/21 and transfuse 10/28 if needed  Per physician team, patient is ready for treatment. Please note that modifications are being made to the treatment plan including . Give Nplate and dose reductions made.

## 2021-09-08 NOTE — Addendum Note (Signed)
Addended by: Gerhard Perches on: 09/08/2021 03:07 PM   Modules accepted: Orders

## 2021-09-09 ENCOUNTER — Telehealth: Payer: Self-pay

## 2021-09-09 NOTE — Telephone Encounter (Signed)
Called and spoke with patient for first time chemo follow-up.  Patient stated he was tolerated treatment well, but was having some loose stools.  Patient stated he is taking imodium.  Reminded patient of pump stop appt scheduled for 09/10/21. Patient instructed to call office if he had any questions or concerns.  Patient verbalized understanding.

## 2021-09-10 ENCOUNTER — Inpatient Hospital Stay: Payer: Medicare Other

## 2021-09-10 ENCOUNTER — Other Ambulatory Visit: Payer: Self-pay

## 2021-09-10 VITALS — BP 119/62 | HR 66 | Temp 98.0°F | Resp 18

## 2021-09-10 DIAGNOSIS — Z5111 Encounter for antineoplastic chemotherapy: Secondary | ICD-10-CM | POA: Diagnosis not present

## 2021-09-10 DIAGNOSIS — C250 Malignant neoplasm of head of pancreas: Secondary | ICD-10-CM

## 2021-09-10 DIAGNOSIS — C259 Malignant neoplasm of pancreas, unspecified: Secondary | ICD-10-CM

## 2021-09-10 LAB — CBC WITH DIFFERENTIAL (CANCER CENTER ONLY)
Abs Immature Granulocytes: 0.01 10*3/uL (ref 0.00–0.07)
Basophils Absolute: 0 10*3/uL (ref 0.0–0.1)
Basophils Relative: 1 %
Eosinophils Absolute: 0.2 10*3/uL (ref 0.0–0.5)
Eosinophils Relative: 3 %
HCT: 25.9 % — ABNORMAL LOW (ref 39.0–52.0)
Hemoglobin: 8.1 g/dL — ABNORMAL LOW (ref 13.0–17.0)
Immature Granulocytes: 0 %
Lymphocytes Relative: 10 %
Lymphs Abs: 0.5 10*3/uL — ABNORMAL LOW (ref 0.7–4.0)
MCH: 31.4 pg (ref 26.0–34.0)
MCHC: 31.3 g/dL (ref 30.0–36.0)
MCV: 100.4 fL — ABNORMAL HIGH (ref 80.0–100.0)
Monocytes Absolute: 0.2 10*3/uL (ref 0.1–1.0)
Monocytes Relative: 4 %
Neutro Abs: 3.7 10*3/uL (ref 1.7–7.7)
Neutrophils Relative %: 82 %
Platelet Count: 45 10*3/uL — ABNORMAL LOW (ref 150–400)
RBC: 2.58 MIL/uL — ABNORMAL LOW (ref 4.22–5.81)
RDW: 15.6 % — ABNORMAL HIGH (ref 11.5–15.5)
WBC Count: 4.6 10*3/uL (ref 4.0–10.5)
nRBC: 0 % (ref 0.0–0.2)

## 2021-09-10 LAB — PREPARE RBC (CROSSMATCH)

## 2021-09-10 MED ORDER — PEGFILGRASTIM-BMEZ 6 MG/0.6ML ~~LOC~~ SOSY
6.0000 mg | PREFILLED_SYRINGE | Freq: Once | SUBCUTANEOUS | Status: AC
  Administered 2021-09-10: 6 mg via SUBCUTANEOUS

## 2021-09-10 MED ORDER — SODIUM CHLORIDE 0.9% FLUSH
10.0000 mL | INTRAVENOUS | Status: DC | PRN
  Administered 2021-09-10: 10 mL

## 2021-09-10 MED ORDER — HEPARIN SOD (PORK) LOCK FLUSH 100 UNIT/ML IV SOLN
500.0000 [IU] | Freq: Once | INTRAVENOUS | Status: AC | PRN
  Administered 2021-09-10: 500 [IU]

## 2021-09-10 NOTE — Patient Instructions (Signed)
[facility]  Discharge Instructions: Thank you for choosing [facility] to provide your oncology and hematology care.   If you have a lab appointment with the [facility], please go directly to the [facility] and check in at the registration area.   Wear comfortable clothing and clothing appropriate for easy access to any Portacath or PICC line.   We strive to give you quality time with your provider. You may need to reschedule your appointment if you arrive late (15 or more minutes).  Arriving late affects you and other patients whose appointments are after yours.  Also, if you miss three or more appointments without notifying the office, you may be dismissed from the clinic at the provider's discretion.      For prescription refill requests, have your pharmacy contact our office and allow 72 hours for refills to be completed.    Today you received the following  Ziextenzo      To help prevent nausea and vomiting after your treatment, we encourage you to take your nausea medication as directed.  BELOW ARE SYMPTOMS THAT SHOULD BE REPORTED IMMEDIATELY: *FEVER GREATER THAN 100.4 F (38 C) OR HIGHER *CHILLS OR SWEATING *NAUSEA AND VOMITING THAT IS NOT CONTROLLED WITH YOUR NAUSEA MEDICATION *UNUSUAL SHORTNESS OF BREATH *UNUSUAL BRUISING OR BLEEDING *URINARY PROBLEMS (pain or burning when urinating, or frequent urination) *BOWEL PROBLEMS (unusual diarrhea, constipation, pain near the anus) TENDERNESS IN MOUTH AND THROAT WITH OR WITHOUT PRESENCE OF ULCERS (sore throat, sores in mouth, or a toothache) UNUSUAL RASH, SWELLING OR PAIN  UNUSUAL VAGINAL DISCHARGE OR ITCHING   Items with * indicate a potential emergency and should be followed up as soon as possible or go to the Emergency Department if any problems should occur.  Please show the CHEMOTHERAPY ALERT CARD or IMMUNOTHERAPY ALERT CARD at check-in to the Emergency Department and triage  nurse.  Should you have questions after your visit or need to cancel or reschedule your appointment, please contact [facility]  Dept: [phone]  and follow the prompts.  Office hours are 8:00 a.m. to 4:30 p.m. Monday - Friday. Please note that voicemails left after 4:00 p.m. may not be returned until the following business day.  We are closed weekends and major holidays. You have access to a nurse at all times for urgent questions. Please call the main number to the clinic Dept: [phone] and follow the prompts.   For any non-urgent questions, you may also contact your provider using MyChart. We now offer e-Visits for anyone 15 and older to request care online for non-urgent symptoms. For details visit [email or web address].   Also download the MyChart app! Go to the app store, search "MyChart", open the app, select [facility], and log in with your MyChart username and password.  Due to Covid, a mask is required upon entering the hospital/clinic. If you do not have a mask, one will be given to you upon arrival. For doctor visits, patients may have 1 support person aged 25 or older with them. For treatment visits, patients cannot have anyone with them due to current Covid guidelines and our immunocompromised population.   Pegfilgrastim injection What is this medication? PEGFILGRASTIM (PEG fil gra stim) is a long-acting granulocyte colony-stimulating factor that stimulates the growth of neutrophils, a type of white blood cell important in the body's fight against infection. It is used to reduce the incidence of fever and infection in patients with certain types  of cancer who are receiving chemotherapy that affects the bone marrow, and to increase survival after being exposed to high doses of radiation. This medicine may be used for other purposes; ask your health care provider or pharmacist if you have questions. COMMON BRAND NAME(S): Rexene Edison, Ziextenzo What should I tell my care team before I take this medication? They need to know if you have any of these conditions: kidney disease latex allergy ongoing radiation therapy sickle cell disease skin reactions to acrylic adhesives (On-Body Injector only) an unusual or allergic reaction to pegfilgrastim, filgrastim, other medicines, foods, dyes, or preservatives pregnant or trying to get pregnant breast-feeding How should I use this medication? This medicine is for injection under the skin. If you get this medicine at home, you will be taught how to prepare and give the pre-filled syringe or how to use the On-body Injector. Refer to the patient Instructions for Use for detailed instructions. Use exactly as directed. Tell your healthcare provider immediately if you suspect that the On-body Injector may not have performed as intended or if you suspect the use of the On-body Injector resulted in a missed or partial dose. It is important that you put your used needles and syringes in a special sharps container. Do not put them in a trash can. If you do not have a sharps container, call your pharmacist or healthcare provider to get one. Talk to your pediatrician regarding the use of this medicine in children. While this drug may be prescribed for selected conditions, precautions do apply. Overdosage: If you think you have taken too much of this medicine contact a poison control center or emergency room at once. NOTE: This medicine is only for you. Do not share this medicine with others. What if I miss a dose? It is important not to miss your dose. Call your doctor or health care professional if you miss your dose. If you miss a dose due to an On-body Injector failure or leakage, a new dose should be administered as soon as possible using a single prefilled syringe for manual use. What may interact with this medication? Interactions have not been studied. This list may not describe all  possible interactions. Give your health care provider a list of all the medicines, herbs, non-prescription drugs, or dietary supplements you use. Also tell them if you smoke, drink alcohol, or use illegal drugs. Some items may interact with your medicine. What should I watch for while using this medication? Your condition will be monitored carefully while you are receiving this medicine. You may need blood work done while you are taking this medicine. Talk to your health care provider about your risk of cancer. You may be more at risk for certain types of cancer if you take this medicine. If you are going to need a MRI, CT scan, or other procedure, tell your doctor that you are using this medicine (On-Body Injector only). What side effects may I notice from receiving this medication? Side effects that you should report to your doctor or health care professional as soon as possible: allergic reactions (skin rash, itching or hives, swelling of the face, lips, or tongue) back pain dizziness fever pain, redness, or irritation at site where injected pinpoint red spots on the skin red or dark-brown urine shortness of breath or breathing problems stomach or side pain, or pain at the shoulder swelling tiredness trouble passing urine or change in the amount of urine unusual bruising or bleeding Side effects that  usually do not require medical attention (report to your doctor or health care professional if they continue or are bothersome): bone pain muscle pain This list may not describe all possible side effects. Call your doctor for medical advice about side effects. You may report side effects to FDA at 1-800-FDA-1088. Where should I keep my medication? Keep out of the reach of children. If you are using this medicine at home, you will be instructed on how to store it. Throw away any unused medicine after the expiration date on the label. NOTE: This sheet is a summary. It may not cover all  possible information. If you have questions about this medicine, talk to your doctor, pharmacist, or health care provider.  2022 Elsevier/Gold Standard (2020-11-28 11:54:14)

## 2021-09-11 ENCOUNTER — Telehealth: Payer: Self-pay

## 2021-09-11 ENCOUNTER — Inpatient Hospital Stay: Payer: Medicare Other

## 2021-09-11 DIAGNOSIS — Z5111 Encounter for antineoplastic chemotherapy: Secondary | ICD-10-CM | POA: Diagnosis not present

## 2021-09-11 DIAGNOSIS — C250 Malignant neoplasm of head of pancreas: Secondary | ICD-10-CM

## 2021-09-11 MED ORDER — HEPARIN SOD (PORK) LOCK FLUSH 100 UNIT/ML IV SOLN
500.0000 [IU] | Freq: Every day | INTRAVENOUS | Status: AC | PRN
  Administered 2021-09-11: 500 [IU]

## 2021-09-11 MED ORDER — SODIUM CHLORIDE 0.9% IV SOLUTION
250.0000 mL | Freq: Once | INTRAVENOUS | Status: AC
  Administered 2021-09-11: 250 mL via INTRAVENOUS

## 2021-09-11 MED ORDER — SODIUM CHLORIDE 0.9% FLUSH
10.0000 mL | INTRAVENOUS | Status: AC | PRN
  Administered 2021-09-11: 10 mL

## 2021-09-11 NOTE — Telephone Encounter (Signed)
Pt verbalized understanding. Pt stated he will call office if anything changes.

## 2021-09-11 NOTE — Telephone Encounter (Signed)
-----   Message from Ladell Pier, MD sent at 09/10/2021  3:45 PM EDT ----- Please call patient, platelet count is low today, repeat CBC 09/03/2021, call for bleeding

## 2021-09-11 NOTE — Patient Instructions (Signed)
[facility]   Discharge Instructions: Thank you for choosing [facility] to provide your oncology and hematology care.   If you have a lab appointment with the Cancer Center, please go directly to the Cancer Center and check in at the registration area.   Wear comfortable clothing and clothing appropriate for easy access to any Portacath or PICC line.   We strive to give you quality time with your provider. You may need to reschedule your appointment if you arrive late (15 or more minutes).  Arriving late affects you and other patients whose appointments are after yours.  Also, if you miss three or more appointments without notifying the office, you may be dismissed from the clinic at the provider's discretion.      For prescription refill requests, have your pharmacy contact our office and allow 72 hours for refills to be completed.    Today you received the following Blood Transfusion   To help prevent nausea and vomiting after your treatment, we encourage you to take your nausea medication as directed.  BELOW ARE SYMPTOMS THAT SHOULD BE REPORTED IMMEDIATELY: *FEVER GREATER THAN 100.4 F (38 C) OR HIGHER *CHILLS OR SWEATING *NAUSEA AND VOMITING THAT IS NOT CONTROLLED WITH YOUR NAUSEA MEDICATION *UNUSUAL SHORTNESS OF BREATH *UNUSUAL BRUISING OR BLEEDING *URINARY PROBLEMS (pain or burning when urinating, or frequent urination) *BOWEL PROBLEMS (unusual diarrhea, constipation, pain near the anus) TENDERNESS IN MOUTH AND THROAT WITH OR WITHOUT PRESENCE OF ULCERS (sore throat, sores in mouth, or a toothache) UNUSUAL RASH, SWELLING OR PAIN  UNUSUAL VAGINAL DISCHARGE OR ITCHING   Items with * indicate a potential emergency and should be followed up as soon as possible or go to the Emergency Department if any problems should occur.  Please show the CHEMOTHERAPY ALERT CARD or IMMUNOTHERAPY ALERT CARD at check-in to the Emergency Department and triage  nurse.  Should you have questions after your visit or need to cancel or reschedule your appointment, please contact [facility]  Dept: [phone]  and follow the prompts.  Office hours are 8:00 a.m. to 4:30 p.m. Monday - Friday. Please note that voicemails left after 4:00 p.m. may not be returned until the following business day.  We are closed weekends and major holidays. You have access to a nurse at all times for urgent questions. Please call the main number to the clinic Dept: [phone] and follow the prompts.   For any non-urgent questions, you may also contact your provider using MyChart. We now offer e-Visits for anyone 18 and older to request care online for non-urgent symptoms. For details visit [email or web address].   Also download the MyChart app! Go to the app store, search "MyChart", open the app, select , and log in with your MyChart username and password.  Due to Covid, a mask is required upon entering the hospital/clinic. If you do not have a mask, one will be given to you upon arrival. For doctor visits, patients may have 1 support person aged 18 or older with them. For treatment visits, patients cannot have anyone with them due to current Covid guidelines and our immunocompromised population.   Blood Transfusion, Adult, Care After This sheet gives you information about how to care for yourself after your procedure. Your doctor may also give you more specific instructions. If you have problems or questions, contact your doctor. What can I expect after the procedure? After the procedure, it is common to have: Bruising and   soreness at the IV site. A headache. Follow these instructions at home: Insertion site care   Follow instructions from your doctor about how to take care of your insertion site. This is where an IV tube was put into your vein. Make sure you: Wash your hands with soap and water before and after you change your bandage  (dressing). If you cannot use soap and water, use hand sanitizer. Change your bandage as told by your doctor. Check your insertion site every day for signs of infection. Check for: Redness, swelling, or pain. Bleeding from the site. Warmth. Pus or a bad smell. General instructions Take over-the-counter and prescription medicines only as told by your doctor. Rest as told by your doctor. Go back to your normal activities as told by your doctor. Keep all follow-up visits as told by your doctor. This is important. Contact a doctor if: You have itching or red, swollen areas of skin (hives). You feel worried or nervous (anxious). You feel weak after doing your normal activities. You have redness, swelling, warmth, or pain around the insertion site. You have blood coming from the insertion site, and the blood does not stop with pressure. You have pus or a bad smell coming from the insertion site. Get help right away if: You have signs of a serious reaction. This may be coming from an allergy or the body's defense system (immune system). Signs include: Trouble breathing or shortness of breath. Swelling of the face or feeling warm (flushed). Fever or chills. Head, chest, or back pain. Dark pee (urine) or blood in the pee. Widespread rash. Fast heartbeat. Feeling dizzy or light-headed. You may receive your blood transfusion in an outpatient setting. If so, you will be told whom to contact to report any reactions. These symptoms may be an emergency. Do not wait to see if the symptoms will go away. Get medical help right away. Call your local emergency services (911 in the [place] Do not drive yourself to the hospital. Summary Bruising and soreness at the IV site are common. Check your insertion site every day for signs of infection. Rest as told by your doctor. Go back to your normal activities as told by your doctor. Get help right away if you have signs of a serious reaction. This  information is not intended to replace advice given to you by your health care provider. Make sure you discuss any questions you have with your health care provider. Document Revised: 02/26/2021 Document Reviewed: 04/26/2019 Elsevier Patient Education  2022 Elsevier Inc.  

## 2021-09-14 LAB — BPAM RBC
Blood Product Expiration Date: 202211192359
Blood Product Expiration Date: 202211192359
ISSUE DATE / TIME: 202210280729
ISSUE DATE / TIME: 202210280729
Unit Type and Rh: 6200
Unit Type and Rh: 6200

## 2021-09-14 LAB — TYPE AND SCREEN
ABO/RH(D): A POS
Antibody Screen: NEGATIVE
Unit division: 0
Unit division: 0

## 2021-09-15 ENCOUNTER — Inpatient Hospital Stay: Payer: Medicare Other | Attending: Genetic Counselor

## 2021-09-15 ENCOUNTER — Encounter (HOSPITAL_COMMUNITY): Payer: Self-pay

## 2021-09-15 ENCOUNTER — Other Ambulatory Visit: Payer: Self-pay

## 2021-09-15 ENCOUNTER — Inpatient Hospital Stay (HOSPITAL_COMMUNITY)
Admission: EM | Admit: 2021-09-15 | Discharge: 2021-09-18 | DRG: 441 | Disposition: A | Discharge status: Home / Self Care | Payer: Medicare Other | Attending: Internal Medicine | Admitting: Internal Medicine

## 2021-09-15 ENCOUNTER — Emergency Department (HOSPITAL_COMMUNITY): Payer: Medicare Other

## 2021-09-15 ENCOUNTER — Inpatient Hospital Stay: Payer: Medicare Other

## 2021-09-15 VITALS — BP 101/56 | HR 57 | Temp 97.6°F | Resp 18

## 2021-09-15 DIAGNOSIS — Z5111 Encounter for antineoplastic chemotherapy: Secondary | ICD-10-CM | POA: Insufficient documentation

## 2021-09-15 DIAGNOSIS — K3189 Other diseases of stomach and duodenum: Secondary | ICD-10-CM | POA: Diagnosis present

## 2021-09-15 DIAGNOSIS — E119 Type 2 diabetes mellitus without complications: Secondary | ICD-10-CM | POA: Diagnosis not present

## 2021-09-15 DIAGNOSIS — K766 Portal hypertension: Principal | ICD-10-CM | POA: Diagnosis present

## 2021-09-15 DIAGNOSIS — G8929 Other chronic pain: Secondary | ICD-10-CM | POA: Diagnosis present

## 2021-09-15 DIAGNOSIS — E871 Hypo-osmolality and hyponatremia: Secondary | ICD-10-CM | POA: Diagnosis present

## 2021-09-15 DIAGNOSIS — R0981 Nasal congestion: Secondary | ICD-10-CM | POA: Insufficient documentation

## 2021-09-15 DIAGNOSIS — I864 Gastric varices: Secondary | ICD-10-CM | POA: Diagnosis present

## 2021-09-15 DIAGNOSIS — K8681 Exocrine pancreatic insufficiency: Secondary | ICD-10-CM | POA: Diagnosis present

## 2021-09-15 DIAGNOSIS — R109 Unspecified abdominal pain: Secondary | ICD-10-CM | POA: Diagnosis not present

## 2021-09-15 DIAGNOSIS — M549 Dorsalgia, unspecified: Secondary | ICD-10-CM | POA: Diagnosis not present

## 2021-09-15 DIAGNOSIS — D630 Anemia in neoplastic disease: Secondary | ICD-10-CM | POA: Diagnosis present

## 2021-09-15 DIAGNOSIS — R000 Tachycardia, unspecified: Secondary | ICD-10-CM | POA: Insufficient documentation

## 2021-09-15 DIAGNOSIS — Z95828 Presence of other vascular implants and grafts: Secondary | ICD-10-CM

## 2021-09-15 DIAGNOSIS — I10 Essential (primary) hypertension: Secondary | ICD-10-CM | POA: Diagnosis present

## 2021-09-15 DIAGNOSIS — D696 Thrombocytopenia, unspecified: Secondary | ICD-10-CM

## 2021-09-15 DIAGNOSIS — Z20822 Contact with and (suspected) exposure to covid-19: Secondary | ICD-10-CM | POA: Diagnosis present

## 2021-09-15 DIAGNOSIS — C250 Malignant neoplasm of head of pancreas: Secondary | ICD-10-CM | POA: Insufficient documentation

## 2021-09-15 DIAGNOSIS — R059 Cough, unspecified: Secondary | ICD-10-CM | POA: Insufficient documentation

## 2021-09-15 DIAGNOSIS — Z515 Encounter for palliative care: Secondary | ICD-10-CM | POA: Diagnosis not present

## 2021-09-15 DIAGNOSIS — Z87891 Personal history of nicotine dependence: Secondary | ICD-10-CM

## 2021-09-15 DIAGNOSIS — E876 Hypokalemia: Secondary | ICD-10-CM | POA: Diagnosis not present

## 2021-09-15 DIAGNOSIS — Z79899 Other long term (current) drug therapy: Secondary | ICD-10-CM | POA: Diagnosis not present

## 2021-09-15 DIAGNOSIS — I8501 Esophageal varices with bleeding: Principal | ICD-10-CM | POA: Diagnosis present

## 2021-09-15 DIAGNOSIS — Z800 Family history of malignant neoplasm of digestive organs: Secondary | ICD-10-CM

## 2021-09-15 DIAGNOSIS — Z7984 Long term (current) use of oral hypoglycemic drugs: Secondary | ICD-10-CM | POA: Diagnosis not present

## 2021-09-15 DIAGNOSIS — E1165 Type 2 diabetes mellitus with hyperglycemia: Secondary | ICD-10-CM | POA: Diagnosis present

## 2021-09-15 DIAGNOSIS — D6959 Other secondary thrombocytopenia: Secondary | ICD-10-CM | POA: Diagnosis present

## 2021-09-15 DIAGNOSIS — K8020 Calculus of gallbladder without cholecystitis without obstruction: Secondary | ICD-10-CM | POA: Diagnosis present

## 2021-09-15 DIAGNOSIS — T451X5A Adverse effect of antineoplastic and immunosuppressive drugs, initial encounter: Secondary | ICD-10-CM | POA: Diagnosis present

## 2021-09-15 DIAGNOSIS — G629 Polyneuropathy, unspecified: Secondary | ICD-10-CM | POA: Diagnosis not present

## 2021-09-15 DIAGNOSIS — D62 Acute posthemorrhagic anemia: Secondary | ICD-10-CM | POA: Diagnosis present

## 2021-09-15 DIAGNOSIS — I8511 Secondary esophageal varices with bleeding: Secondary | ICD-10-CM | POA: Diagnosis present

## 2021-09-15 DIAGNOSIS — D649 Anemia, unspecified: Secondary | ICD-10-CM | POA: Diagnosis not present

## 2021-09-15 DIAGNOSIS — K219 Gastro-esophageal reflux disease without esophagitis: Secondary | ICD-10-CM | POA: Diagnosis present

## 2021-09-15 DIAGNOSIS — C787 Secondary malignant neoplasm of liver and intrahepatic bile duct: Secondary | ICD-10-CM | POA: Diagnosis present

## 2021-09-15 DIAGNOSIS — Z833 Family history of diabetes mellitus: Secondary | ICD-10-CM

## 2021-09-15 DIAGNOSIS — D702 Other drug-induced agranulocytosis: Secondary | ICD-10-CM | POA: Insufficient documentation

## 2021-09-15 DIAGNOSIS — C259 Malignant neoplasm of pancreas, unspecified: Secondary | ICD-10-CM | POA: Diagnosis present

## 2021-09-15 DIAGNOSIS — K922 Gastrointestinal hemorrhage, unspecified: Secondary | ICD-10-CM | POA: Insufficient documentation

## 2021-09-15 DIAGNOSIS — R0602 Shortness of breath: Secondary | ICD-10-CM | POA: Insufficient documentation

## 2021-09-15 DIAGNOSIS — Z7189 Other specified counseling: Secondary | ICD-10-CM | POA: Diagnosis not present

## 2021-09-15 LAB — RESP PANEL BY RT-PCR (FLU A&B, COVID) ARPGX2
Influenza A by PCR: NEGATIVE
Influenza B by PCR: NEGATIVE
SARS Coronavirus 2 by RT PCR: NEGATIVE

## 2021-09-15 LAB — CBC
HCT: 22.9 % — ABNORMAL LOW (ref 39.0–52.0)
Hemoglobin: 7.2 g/dL — ABNORMAL LOW (ref 13.0–17.0)
MCH: 31.3 pg (ref 26.0–34.0)
MCHC: 31.4 g/dL (ref 30.0–36.0)
MCV: 99.6 fL (ref 80.0–100.0)
Platelets: 53 10*3/uL — ABNORMAL LOW (ref 150–400)
RBC: 2.3 MIL/uL — ABNORMAL LOW (ref 4.22–5.81)
RDW: 16.5 % — ABNORMAL HIGH (ref 11.5–15.5)
WBC: 13.1 10*3/uL — ABNORMAL HIGH (ref 4.0–10.5)
nRBC: 0 % (ref 0.0–0.2)

## 2021-09-15 LAB — CBC WITH DIFFERENTIAL (CANCER CENTER ONLY)
Abs Immature Granulocytes: 0.32 10*3/uL — ABNORMAL HIGH (ref 0.00–0.07)
Basophils Absolute: 0.1 10*3/uL (ref 0.0–0.1)
Basophils Relative: 1 %
Eosinophils Absolute: 0.5 10*3/uL (ref 0.0–0.5)
Eosinophils Relative: 4 %
HCT: 20.6 % — ABNORMAL LOW (ref 39.0–52.0)
Hemoglobin: 6.6 g/dL — CL (ref 13.0–17.0)
Immature Granulocytes: 3 %
Lymphocytes Relative: 5 %
Lymphs Abs: 0.6 10*3/uL — ABNORMAL LOW (ref 0.7–4.0)
MCH: 30.6 pg (ref 26.0–34.0)
MCHC: 32 g/dL (ref 30.0–36.0)
MCV: 95.4 fL (ref 80.0–100.0)
Monocytes Absolute: 0.2 10*3/uL (ref 0.1–1.0)
Monocytes Relative: 2 %
Neutro Abs: 8.8 10*3/uL — ABNORMAL HIGH (ref 1.7–7.7)
Neutrophils Relative %: 85 %
Platelet Count: 47 10*3/uL — ABNORMAL LOW (ref 150–400)
RBC: 2.16 MIL/uL — ABNORMAL LOW (ref 4.22–5.81)
RDW: 16.2 % — ABNORMAL HIGH (ref 11.5–15.5)
WBC Count: 10.4 10*3/uL (ref 4.0–10.5)
nRBC: 0 % (ref 0.0–0.2)

## 2021-09-15 LAB — COMPREHENSIVE METABOLIC PANEL
ALT: 61 U/L — ABNORMAL HIGH (ref 0–44)
AST: 35 U/L (ref 15–41)
Albumin: 3.2 g/dL — ABNORMAL LOW (ref 3.5–5.0)
Alkaline Phosphatase: 286 U/L — ABNORMAL HIGH (ref 38–126)
Anion gap: 5 (ref 5–15)
BUN: 31 mg/dL — ABNORMAL HIGH (ref 8–23)
CO2: 19 mmol/L — ABNORMAL LOW (ref 22–32)
Calcium: 8 mg/dL — ABNORMAL LOW (ref 8.9–10.3)
Chloride: 109 mmol/L (ref 98–111)
Creatinine, Ser: 1.22 mg/dL (ref 0.61–1.24)
GFR, Estimated: >60 mL/min (ref >60)
Glucose, Bld: 246 mg/dL — ABNORMAL HIGH (ref 70–99)
Potassium: 4.2 mmol/L (ref 3.5–5.1)
Sodium: 133 mmol/L — ABNORMAL LOW (ref 135–145)
Total Bilirubin: 1.1 mg/dL (ref 0.3–1.2)
Total Protein: 5.8 g/dL — ABNORMAL LOW (ref 6.5–8.1)

## 2021-09-15 LAB — PROTIME-INR
INR: 1.2 (ref 0.8–1.2)
Prothrombin Time: 15.3 seconds — ABNORMAL HIGH (ref 11.4–15.2)

## 2021-09-15 LAB — SAMPLE TO BLOOD BANK

## 2021-09-15 LAB — CBG MONITORING, ED: Glucose-Capillary: 118 mg/dL — ABNORMAL HIGH (ref 70–99)

## 2021-09-15 LAB — PREPARE RBC (CROSSMATCH)

## 2021-09-15 LAB — APTT: aPTT: 26 seconds (ref 24–36)

## 2021-09-15 MED ORDER — INSULIN ASPART 100 UNIT/ML IJ SOLN
0.0000 [IU] | Freq: Three times a day (TID) | INTRAMUSCULAR | Status: DC
  Administered 2021-09-16 – 2021-09-18 (×4): 1 [IU] via SUBCUTANEOUS
  Filled 2021-09-15: qty 0.09

## 2021-09-15 MED ORDER — PANTOPRAZOLE SODIUM 40 MG IV SOLR
40.0000 mg | Freq: Two times a day (BID) | INTRAVENOUS | Status: DC
  Filled 2021-09-15: qty 40

## 2021-09-15 MED ORDER — LOPERAMIDE HCL 2 MG PO CAPS: 2.0000 mg | ORAL_CAPSULE | Freq: Three times a day (TID) | ORAL | Status: DC | PRN

## 2021-09-15 MED ORDER — FAMOTIDINE 20 MG PO TABS: 10.0000 mg | ORAL_TABLET | Freq: Every day | ORAL | Status: DC | PRN

## 2021-09-15 MED ORDER — PROPRANOLOL HCL 10 MG PO TABS: 10.0000 mg | ORAL_TABLET | Freq: Two times a day (BID) | ORAL | Status: DC | PRN

## 2021-09-15 MED ORDER — ONDANSETRON HCL 4 MG/2ML IJ SOLN
4.0000 mg | Freq: Four times a day (QID) | INTRAMUSCULAR | Status: DC | PRN
  Administered 2021-09-15: 4 mg via INTRAVENOUS
  Filled 2021-09-15: qty 2

## 2021-09-15 MED ORDER — POTASSIUM CHLORIDE CRYS ER 20 MEQ PO TBCR
20.0000 meq | EXTENDED_RELEASE_TABLET | Freq: Every day | ORAL | Status: DC
  Administered 2021-09-17 – 2021-09-18 (×2): 20 meq via ORAL
  Filled 2021-09-15 (×3): qty 1

## 2021-09-15 MED ORDER — ONDANSETRON HCL 4 MG PO TABS: 8.0000 mg | ORAL_TABLET | Freq: Three times a day (TID) | ORAL | Status: DC | PRN

## 2021-09-15 MED ORDER — ACETAMINOPHEN 325 MG PO TABS: 650.0000 mg | ORAL_TABLET | Freq: Four times a day (QID) | ORAL | Status: DC | PRN

## 2021-09-15 MED ORDER — PANTOPRAZOLE 80MG IVPB - SIMPLE MED
80.0000 mg | Freq: Once | INTRAVENOUS | Status: AC
  Administered 2021-09-15: 80 mg via INTRAVENOUS
  Filled 2021-09-15: qty 80

## 2021-09-15 MED ORDER — ONDANSETRON 8 MG PO TBDP
8.0000 mg | ORAL_TABLET | Freq: Once | ORAL | Status: AC
  Administered 2021-09-15: 8 mg via ORAL
  Filled 2021-09-15: qty 1

## 2021-09-15 MED ORDER — SODIUM CHLORIDE 0.9% FLUSH
10.0000 mL | INTRAVENOUS | Status: DC | PRN
  Administered 2021-09-15: 10 mL

## 2021-09-15 MED ORDER — HYDROCODONE-ACETAMINOPHEN 5-325 MG PO TABS
1.0000 | ORAL_TABLET | ORAL | Status: DC | PRN
Start: 2021-09-15 — End: 2021-09-19

## 2021-09-15 MED ORDER — MAGNESIUM HYDROXIDE 400 MG/5ML PO SUSP: 30.0000 mL | Freq: Every day | ORAL | Status: DC | PRN

## 2021-09-15 MED ORDER — LORATADINE 10 MG PO TABS: 10.0000 mg | ORAL_TABLET | Freq: Every day | ORAL | Status: DC | PRN

## 2021-09-15 MED ORDER — SODIUM CHLORIDE 0.9 % IV SOLN
50.0000 ug/h | INTRAVENOUS | Status: DC
  Filled 2021-09-15: qty 1

## 2021-09-15 MED ORDER — ROMIPLOSTIM 250 MCG ~~LOC~~ SOLR
3.0000 ug/kg | Freq: Once | SUBCUTANEOUS | Status: AC
  Administered 2021-09-15: 185 ug via SUBCUTANEOUS
  Filled 2021-09-15: qty 0.37

## 2021-09-15 MED ORDER — SODIUM CHLORIDE 0.9 % IV SOLN: INTRAVENOUS | Status: DC

## 2021-09-15 MED ORDER — SODIUM CHLORIDE 0.9 % IV SOLN
10.0000 mL/h | Freq: Once | INTRAVENOUS | Status: AC
  Administered 2021-09-15: 10 mL/h via INTRAVENOUS

## 2021-09-15 MED ORDER — HEPARIN SOD (PORK) LOCK FLUSH 100 UNIT/ML IV SOLN
500.0000 [IU] | Freq: Once | INTRAVENOUS | Status: AC | PRN
  Administered 2021-09-15: 500 [IU]

## 2021-09-15 MED ORDER — ACETAMINOPHEN 650 MG RE SUPP: 650.0000 mg | Freq: Four times a day (QID) | RECTAL | Status: DC | PRN

## 2021-09-15 MED ORDER — PROCHLORPERAZINE MALEATE 10 MG PO TABS: 10.0000 mg | ORAL_TABLET | Freq: Four times a day (QID) | ORAL | Status: DC | PRN

## 2021-09-15 MED ORDER — OCTREOTIDE LOAD VIA INFUSION
50.0000 ug | Freq: Once | INTRAVENOUS | Status: AC
  Administered 2021-09-15: 50 ug via INTRAVENOUS
  Filled 2021-09-15: qty 25

## 2021-09-15 MED ORDER — OCTREOTIDE ACETATE 500 MCG/ML IJ SOLN
50.0000 ug/h | INTRAMUSCULAR | Status: DC
  Administered 2021-09-15 – 2021-09-18 (×6): 50 ug/h via INTRAVENOUS
  Filled 2021-09-15 (×8): qty 1

## 2021-09-15 MED ORDER — TRAZODONE HCL 50 MG PO TABS: 25.0000 mg | ORAL_TABLET | Freq: Every evening | ORAL | Status: DC | PRN

## 2021-09-15 MED ORDER — MAGNESIUM OXIDE -MG SUPPLEMENT 400 (240 MG) MG PO TABS
400.0000 mg | ORAL_TABLET | Freq: Two times a day (BID) | ORAL | Status: DC
  Administered 2021-09-15 – 2021-09-18 (×4): 400 mg via ORAL
  Filled 2021-09-15 (×5): qty 1

## 2021-09-15 MED ORDER — ONDANSETRON HCL 4 MG/2ML IJ SOLN: 4.0000 mg | Freq: Once | INTRAMUSCULAR | Status: DC

## 2021-09-15 MED ORDER — SODIUM CHLORIDE 0.9% IV SOLUTION: Freq: Once | INTRAVENOUS | Status: AC

## 2021-09-15 MED ORDER — PIOGLITAZONE HCL 15 MG PO TABS
45.0000 mg | ORAL_TABLET | Freq: Every day | ORAL | Status: DC
  Filled 2021-09-15: qty 1

## 2021-09-15 MED ORDER — LIDOCAINE-PRILOCAINE 2.5-2.5 % EX CREA: 1.0000 "application " | TOPICAL_CREAM | CUTANEOUS | Status: DC | PRN

## 2021-09-15 MED ORDER — PANTOPRAZOLE INFUSION (NEW) - SIMPLE MED
8.0000 mg/h | INTRAVENOUS | Status: DC
  Administered 2021-09-15 – 2021-09-17 (×4): 8 mg/h via INTRAVENOUS
  Filled 2021-09-15 (×2): qty 100
  Filled 2021-09-15 (×2): qty 80
  Filled 2021-09-15: qty 100
  Filled 2021-09-15: qty 80
  Filled 2021-09-15: qty 100

## 2021-09-15 MED ORDER — LATANOPROST 0.005 % OP SOLN
1.0000 [drp] | Freq: Every day | OPHTHALMIC | Status: DC
  Administered 2021-09-16 – 2021-09-17 (×2): 1 [drp] via OPHTHALMIC
  Filled 2021-09-15 (×2): qty 2.5

## 2021-09-15 MED ORDER — PANTOPRAZOLE SODIUM 40 MG PO TBEC: 40.0000 mg | DELAYED_RELEASE_TABLET | Freq: Two times a day (BID) | ORAL | Status: DC

## 2021-09-15 MED ORDER — PANCRELIPASE (LIP-PROT-AMYL) 12000-38000 UNITS PO CPEP
72000.0000 [IU] | ORAL_CAPSULE | Freq: Three times a day (TID) | ORAL | Status: DC
  Administered 2021-09-16 – 2021-09-18 (×3): 72000 [IU] via ORAL
  Filled 2021-09-15: qty 2
  Filled 2021-09-15 (×2): qty 6
  Filled 2021-09-15: qty 2
  Filled 2021-09-15 (×3): qty 6

## 2021-09-15 MED ORDER — DIPHENOXYLATE-ATROPINE 2.5-0.025 MG PO TABS: 1.0000 | ORAL_TABLET | Freq: Four times a day (QID) | ORAL | Status: DC | PRN

## 2021-09-15 MED ORDER — ONDANSETRON HCL 4 MG PO TABS: 4.0000 mg | ORAL_TABLET | Freq: Four times a day (QID) | ORAL | Status: DC | PRN

## 2021-09-15 MED ORDER — DIPHENHYDRAMINE HCL 25 MG PO CAPS: 50.0000 mg | ORAL_CAPSULE | Freq: Every day | ORAL | Status: DC | PRN

## 2021-09-15 NOTE — ED Provider Notes (Signed)
Emergency Medicine Provider Triage Evaluation Note  Aaron Johnston , a 75 y.o. male  was evaluated in triage.  Pt complains of hematemesis, and melena x 3 days.  Patient recently got a change in his chemotherapy regimen approximately 1 week ago.  He reports since this change his symptoms have been consistent of diarrhea, with bloody stools.  Did have 1 episode of hematemesis.  Feels overall weak.  Had an appointment today at the cancer center where they noted his hemoglobin was 6, this is a drop from September 10, 2021 where his hemoglobin was at an 8.1.  He reports generalized weakness.  Review of Systems  Positive: Weakness, fatigue, blood in stool, hematemesis Negative: Chest pain, shortness of breath, abdominal pain  Physical Exam  BP 109/71 (BP Location: Right Arm)   Pulse 89   Temp 98 F (36.7 C) (Oral)   Resp 16   SpO2 100%  Gen:   Awake, no distress   Resp:  Normal effort  MSK:   Moves extremities without difficulty  Other:    Medical Decision Making  Medically screening exam initiated at 1:17 PM.  Appropriate orders placed.  Dalessandro Baldyga was informed that the remainder of the evaluation will be completed by another provider, this initial triage assessment does not replace that evaluation, and the importance of remaining in the ED until their evaluation is complete.  Patient with a history of pancreatic cancer here with blood in his stool and a hemoglobin drop of 2 points in about a week.  Labs were performed this morning, will add additional labs at this time.  Recent hospitalization for a similar complaint where he spent 5 days in the hospital and had a normal EGD along with colonoscopy.  He is hemodynamically stable.   Janeece Fitting, PA-C 09/15/21 1324    Charlesetta Shanks, MD 09/22/21 [phone]

## 2021-09-15 NOTE — ED Provider Notes (Signed)
[facility] DEPT Provider Note   CSN: [id number] Arrival date & time: 09/15/21  1245     History Chief Complaint  Patient presents with   Abnormal Lab   Rectal Bleeding    Aaron Johnston is a 75 y.o. male.  HPI    75 year old male comes in with chief complaint of abnormal labs and rectal bleeding.  Patient has history of metastatic pancreatic cancer, hypertension, hyperlipidemia.  He was admitted to the hospital on 1013 with GI bleed requiring blood transfusion and vitamin K given his INR was over 10.  He reports that he had gone to a drop bridge for injection to promote platelet production, when his blood work revealed hemoglobin of 6.6.  Patient states that he continues to have generalized weakness and also continues to have intermittent episodes of bloody stools and occasional bloody vomitus.  His last episode of emesis was on Sunday.  On Saturday he did have large volume bloody stools, mostly maroon in color but sometimes dark and tarry.  Patient had upper and lower endoscopy last time he was in the hospital.  He is not on any blood thinners at this time.   Past Medical History:  Diagnosis Date   Diabetes mellitus without complication ([place])    Family history of breast cancer    Family history of colon cancer    Family history of pancreatic cancer    Family history of stomach cancer     Patient Active Problem List   Diagnosis Date Noted   Thrombocytopenia ([place]) 09/08/2021   Protein-calorie malnutrition, severe 08/29/2021   GI bleed 08/27/2021   Genetic testing 08/28/2020   Family history of breast cancer    Family history of pancreatic cancer    Family history of colon cancer    Family history of stomach cancer    Port-A-Cath in place 08/11/2020   Primary cancer of head of pancreas ([place]) 07/22/2020   Goals of care, counseling/discussion 07/22/2020    Past Surgical History:  Procedure Laterality Date   BIOPSY  08/29/2021   Procedure:  BIOPSY;  Surgeon: Otis Brace, MD;  Location: [facility];  Service: Gastroenterology;;   COLONOSCOPY WITH PROPOFOL N/A 08/29/2021   Procedure: COLONOSCOPY WITH PROPOFOL;  Surgeon: Otis Brace, MD;  Location: [facility];  Service: Gastroenterology;  Laterality: N/A;   ESOPHAGOGASTRODUODENOSCOPY (EGD) WITH PROPOFOL N/A 08/27/2021   Procedure: ESOPHAGOGASTRODUODENOSCOPY (EGD) WITH PROPOFOL;  Surgeon: Clarene Essex, MD;  Location: [facility];  Service: Endoscopy;  Laterality: N/A;       Family History  Problem Relation Age of Onset   Diabetes Mother    Pancreatic cancer Father        d. 38   Breast cancer Sister 44   Diabetes Sister    Diabetes Brother    Stomach cancer Paternal Aunt        2 pat aunts with stomach cancer   Colon cancer Paternal Uncle    Cancer Paternal Grandfather        NOS   Diabetes Sister    Diabetes Brother    Brain cancer Paternal Aunt    Cancer Paternal Aunt        eye   Diabetes Son     Social History   Tobacco Use   Smoking status: Never   Smokeless tobacco: Never  Substance Use Topics   Alcohol use: Yes   Drug use: Never    Home Medications Prior to Admission medications   Medication Sig Start Date End Date  Taking? Authorizing Provider  diphenhydrAMINE (BENADRYL) 50 MG capsule Take 50 mg by mouth daily as needed for itching (post-treatment).   Yes [provider]  diphenoxylate-atropine (LOMOTIL) 2.5-0.025 MG tablet Take 1-2 tablets by mouth 4 (four) times daily as needed for diarrhea or loose stools. 09/08/21  Yes Owens Shark, NP  famotidine (PEPCID) 10 MG tablet Take 10 mg by mouth daily as needed (for 1 week after each treatment, for itching).   Yes [provider]  HYDROcodone-acetaminophen (NORCO/VICODIN) 5-325 MG tablet Take 1-2 tablets by mouth every 4 (four) hours as needed. Patient taking differently: Take 1-2 tablets by mouth every 4 (four) hours as needed (for pain). 03/12/21  Yes Owens Shark, NP   KLOR-CON M20 20 MEQ tablet TAKE 1 TABLET BY MOUTH EVERY DAY Patient taking differently: Take 20 mEq by mouth daily. 09/02/21  Yes Ladell Pier, MD  latanoprost (XALATAN) 0.005 % ophthalmic solution Place 1 drop into both eyes at bedtime.   Yes [provider]  lidocaine-prilocaine (EMLA) cream Apply 1 application topically as directed. Apply to port site 1 hour prior to stick and cover with plastic wrap 07/23/20  Yes Ladell Pier, MD  lipase/protease/amylase (CREON) 36000 UNITS CPEP capsule Take 2 capsules (72,000 Units total) by mouth 3 (three) times daily with meals. May also take 1 capsule (36,000 Units total) as needed. Patient taking differently: Take 2 capsules (72,000 Units total) by mouth 3 (three) times daily with meals. May also take 1 capsule (36,000 Units total) as needed for diarrhea 08/06/21  Yes Owens Shark, NP  loperamide (IMODIUM) 2 MG capsule Take 2-4 mg by mouth 3 (three) times daily as needed for diarrhea or loose stools.   Yes [provider]  loratadine (CLARITIN) 10 MG tablet Take 10 mg by mouth daily as needed for itching (post-treatment).   Yes [provider]  magnesium oxide (MAG-OX) 400 MG tablet Take 1 tablet (400 mg total) by mouth 2 (two) times daily. 06/26/21  Yes Owens Shark, NP  ondansetron (ZOFRAN) 8 MG tablet Take 1 tablet (8 mg total) by mouth every 8 (eight) hours as needed for nausea or vomiting. 07/23/20  Yes Ladell Pier, MD  pantoprazole (PROTONIX) 40 MG tablet Take 1 tablet (40 mg total) by mouth 2 (two) times daily. 09/01/21  Yes Patrecia Pour, MD  pioglitazone (ACTOS) 45 MG tablet Take 45 mg by mouth daily. 12/28/13  Yes [provider]  prochlorperazine (COMPAZINE) 10 MG tablet Take 1 tablet (10 mg total) by mouth every 6 (six) hours as needed for nausea. 07/23/20  Yes Ladell Pier, MD  propranolol (INDERAL) 10 MG tablet Take 1 tablet (10 mg total) by mouth 2 (two) times daily. Patient taking differently:  Take 10 mg by mouth 2 (two) times daily as needed (only if Systolic number is 883 or greater). 09/01/21  Yes Patrecia Pour, MD  Accu-Chek FastClix Lancets MISC Apply topically. 08/30/20   [provider]  ACCU-CHEK GUIDE test strip  08/31/20   [provider]  Blood Glucose Monitoring Suppl (ACCU-CHEK GUIDE) w/Device KIT See admin instructions. 08/03/20   [provider]  tadalafil (CIALIS) 20 MG tablet Take 20 mg by mouth daily as needed for erectile dysfunction. 06/11/21   [provider]    Allergies    Patient has no known allergies.  Review of Systems   Review of Systems  Constitutional:  Positive for activity change.  Respiratory:  Positive for shortness  of breath.   Cardiovascular:  Negative for chest pain.  Gastrointestinal:  Positive for blood in stool. Negative for abdominal pain.  Allergic/Immunologic: Positive for immunocompromised state.  Hematological:  Does not bruise/bleed easily.  All other systems reviewed and are negative.  Physical Exam Updated Vital Signs BP 119/78   Pulse 75   Temp 98.4 F (36.9 C) (Oral)   Resp 15   SpO2 100%   Physical Exam Vitals and nursing note reviewed.  Constitutional:      Appearance: He is well-developed.  HENT:     Head: Atraumatic.  Cardiovascular:     Rate and Rhythm: Normal rate.  Pulmonary:     Effort: Pulmonary effort is normal.  Abdominal:     Tenderness: There is no abdominal tenderness.  Musculoskeletal:     Cervical back: Neck supple.  Skin:    General: Skin is warm.     Coloration: Skin is pale.  Neurological:     Mental Status: He is alert and oriented to person, place, and time.    ED Results / Procedures / Treatments   Labs (all labs ordered are listed, but only abnormal results are displayed) Labs Reviewed  PROTIME-INR - Abnormal; Notable for the following components:      Result Value   Prothrombin Time 15.3 (*)    All other components within normal limits   COMPREHENSIVE METABOLIC PANEL - Abnormal; Notable for the following components:   Sodium 133 (*)    CO2 19 (*)    Glucose, Bld 246 (*)    BUN 31 (*)    Calcium 8.0 (*)    Total Protein 5.8 (*)    Albumin 3.2 (*)    ALT 61 (*)    Alkaline Phosphatase 286 (*)    All other components within normal limits  CBC - Abnormal; Notable for the following components:   WBC 13.1 (*)    RBC 2.30 (*)    Hemoglobin 7.2 (*)    HCT 22.9 (*)    RDW 16.5 (*)    Platelets 53 (*)    All other components within normal limits  APTT  URINALYSIS, ROUTINE W REFLEX MICROSCOPIC  POC OCCULT BLOOD, ED  TYPE AND SCREEN  PREPARE RBC (CROSSMATCH)    EKG None  Radiology No results found.  Procedures .Critical Care E&M Performed by: Varney Biles, MD  Critical care provider statement:    Critical care time (minutes):  50   Critical care time was exclusive of:  Separately billable procedures and treating other patients   Critical care was necessary to treat or prevent imminent or life-threatening deterioration of the following conditions: symptomatic anemia.   Critical care was time spent personally by me on the following activities:  Blood draw for specimens, discussions with consultants, discussions with primary provider, evaluation of patient's response to treatment, examination of patient, obtaining history from patient or surrogate, ordering and performing treatments and interventions, ordering and review of laboratory studies, ordering and review of radiographic studies, pulse oximetry and re-evaluation of patient's condition After initial E/M assessment, critical care services were subsequently performed that were exclusive of separately billable procedures or treatment.     Medications Ordered in ED Medications  0.9 %  sodium chloride infusion (Manually program via Guardrails IV Fluids) ( Intravenous New Bag/Given 09/15/21 1741)  0.9 %  sodium chloride infusion (10 mL/hr Intravenous New  Bag/Given 09/15/21 1742)  ondansetron (ZOFRAN-ODT) disintegrating tablet 8 mg (8 mg Oral Given 09/15/21 1819)    ED  Course  I have reviewed the triage vital signs and the nursing notes.  Pertinent labs & imaging results that were available during my care of the patient were reviewed by me and considered in my medical decision making (see chart for details).    MDM Rules/Calculators/A&P                           Pt comes in with cc of low Hb. Pt has history of metastatic pancreatic cancer and has had anemia, thrombocytopenia.  He is noted to be symptomatic with is anemic with a hemoglobin of 6.6 that was recorded just prior to ED arrival.  Platelet count was 47,000 at that time.  Patient reports having intermittent GI bleed that have persisted after the discharge from the hospital recently for the same complaint.  At that time he required blood transfusion.  I reviewed the upper and lower endoscopy from that visit.  We will give him Protonix and octreotide.  The GI doctor did not see any specific source of bleed and reported that even if there was a source, it was unlikely amenable for treatment via endoscopy.  Discussed this case with hospitalist.  They will send a message to Dr. Paulita Fujita.  Spoke with Dr. Burr Medico.  She reports that if patient's platelet count is over 50,000 and not having heavy active bleeding, then we can hold off on platelet transfusion.  We can transfuse if patient starts having large volume bloody stools.  Also consulted palliative medicine.  Patient is open for conversation on goals of care with them, with emphasis on prolonged high-quality of life. They will see the patient tomorrow.  Final Clinical Impression(s) / ED Diagnoses Final diagnoses:  Symptomatic anemia  Thrombocytopenia ([place])    Rx / DC Orders ED Discharge Orders     None        Varney Biles, MD 09/15/21 1953

## 2021-09-15 NOTE — Progress Notes (Signed)
Patient arrived to infusion room stating he had decreased energy level and was having rectal bleeding, noticeable with each BM.  CBC checked and Hgb resulted at 6.6. Dr. Benay Spice made aware. Per Dr. Benay Spice, patient needs to go to ED to be further evaluated.  Ok given for patient to go via private vehicle to either [facility] ED or [facility]. Informed patient and wife of Dr. Gearldine Shown response and both verbalized understanding. Patient stable upon leaving infusion area.

## 2021-09-15 NOTE — H&P (Addendum)
[place]   PATIENT NAME: Aaron Johnston    MR#:  [id number]  DATE OF BIRTH:  May 30, 1946  DATE OF ADMISSION:  09/15/2021  PRIMARY CARE PHYSICIAN: [name], [name], MD   Patient is coming from: Home  REQUESTING/REFERRING PHYSICIAN: Varney Biles, MD  CHIEF COMPLAINT:   Chief Complaint  Patient presents with   Abnormal Lab   Rectal Bleeding    HISTORY OF PRESENT ILLNESS:  Aaron Johnston is a 75 y.o. African-American male with medical history significant for metastatic pancreatic cancer, thrombocytopenia, GERD, chronic pain and type 2 diabetes mellitus, who presented to the emergency room with acute onset of abnormal labs with symptomatic anemia with hemoglobin of 6.6 earlier today.  The patient has been having maroon-colored stools and admitted to blood-tinged vomitus over the weekend.  He continues to have generalized weakness and admitted to intermittent episodes of bloody stools with bright red bleeding per rectum as well as occasional hematemesis.  He has occasional melena and occasionally with to have maroon-colored stools.  He was recently admitted here on 10/13 with GI bleeding requiring blood transfusion and vitamin K given INR of over 10.  He was expected to have an injection for thrombocytopenia today.  He underwent upper and lower GI endoscopy during his last hospitalization.  His bleeding source was thought to be related to colonic etiology from friable mucosa.  No current NSAIDs use or alcohol abuse.  He denies any worsening abdominal pain.  No dysuria, oliguria or hematuria or flank pain.  No other bleeding diathesis.  He has dyspnea on exertion.  No cough or wheezing.  No chest pain or or palpitations.  He admitted to dizziness this morning with no headache or blurred vision, presyncope or syncope.  ED Course: When he came to the ER blood pressure was 112/56 with otherwise normal vital signs.  Labs revealed hemoglobin of 7.2 hematocrit 22.9 compared to 6.6 and 20.6  earlier this morning.  Platelets were 53 WBC was 13.1 up from 10.4 earlier this morning.  CMP was remarkable for mild hyponatremia 133 and hyperglycemia of 246, CO2 of 19 and BUN of 31 with creatinine of 1.22 and alk phos 286 with ALT of 61 and total protein at 5.8 with albumin of 3.2.  PT was 15.3 with INR 1.2 and PTT of 26.  Blood group was A+ with negative antibody screen.  Imaging: Portable chest ray showed no acute cardiopulmonary disease.  The patient was given 8 mg p.o. Zofran, IV Protonix bolus as well as IV octreotide bolus and will be placed on drips.  He will be admitted to a telemetry bed for further evaluation and management. PAST MEDICAL HISTORY:   Past Medical History:  Diagnosis Date   Diabetes mellitus without complication ([place])    Family history of breast cancer    Family history of colon cancer    Family history of pancreatic cancer    Family history of stomach cancer     PAST SURGICAL HISTORY:   Past Surgical History:  Procedure Laterality Date   BIOPSY  08/29/2021   Procedure: BIOPSY;  Surgeon: Otis Brace, MD;  Location: [facility];  Service: Gastroenterology;;   COLONOSCOPY WITH PROPOFOL N/A 08/29/2021   Procedure: COLONOSCOPY WITH PROPOFOL;  Surgeon: Otis Brace, MD;  Location: [facility];  Service: Gastroenterology;  Laterality: N/A;   ESOPHAGOGASTRODUODENOSCOPY (EGD) WITH PROPOFOL N/A 08/27/2021   Procedure: ESOPHAGOGASTRODUODENOSCOPY (EGD) WITH PROPOFOL;  Surgeon: Clarene Essex, MD;  Location: [facility];  Service: Endoscopy;  Laterality:  N/A;    SOCIAL HISTORY:   Social History   Tobacco Use   Smoking status: Former    Types: Cigarettes    Quit date: 08/17/1980    Years since quitting: 41.1   Smokeless tobacco: Never  Substance Use Topics   Alcohol use: Not Currently    FAMILY HISTORY:   Family History  Problem Relation Age of Onset   Diabetes Mother    Pancreatic cancer Father        d. 80   Breast cancer Sister 44    Diabetes Sister    Diabetes Brother    Stomach cancer Paternal Aunt        2 pat aunts with stomach cancer   Colon cancer Paternal Uncle    Cancer Paternal Grandfather        NOS   Diabetes Sister    Diabetes Brother    Brain cancer Paternal Aunt    Cancer Paternal Aunt        eye   Diabetes Son     DRUG ALLERGIES:  No Known Allergies  REVIEW OF SYSTEMS:   ROS As per history of present illness. All pertinent systems were reviewed above. Constitutional, HEENT, cardiovascular, respiratory, GI, GU, musculoskeletal, neuro, psychiatric, endocrine, integumentary and hematologic systems were reviewed and are otherwise negative/unremarkable except for positive findings mentioned above in the HPI.   MEDICATIONS AT HOME:   Prior to Admission medications   Medication Sig Start Date End Date Taking? Authorizing Provider  diphenhydrAMINE (BENADRYL) 50 MG capsule Take 50 mg by mouth daily as needed for itching (post-treatment).   Yes [provider]  diphenoxylate-atropine (LOMOTIL) 2.5-0.025 MG tablet Take 1-2 tablets by mouth 4 (four) times daily as needed for diarrhea or loose stools. 09/08/21  Yes Owens Shark, NP  famotidine (PEPCID) 10 MG tablet Take 10 mg by mouth daily as needed (for 1 week after each treatment, for itching).   Yes [provider]  HYDROcodone-acetaminophen (NORCO/VICODIN) 5-325 MG tablet Take 1-2 tablets by mouth every 4 (four) hours as needed. Patient taking differently: Take 1-2 tablets by mouth every 4 (four) hours as needed (for pain). 03/12/21  Yes Owens Shark, NP  KLOR-CON M20 20 MEQ tablet TAKE 1 TABLET BY MOUTH EVERY DAY Patient taking differently: Take 20 mEq by mouth daily. 09/02/21  Yes Ladell Pier, MD  latanoprost (XALATAN) 0.005 % ophthalmic solution Place 1 drop into both eyes at bedtime.   Yes [provider]  lidocaine-prilocaine (EMLA) cream Apply 1 application topically as directed. Apply to port site 1 hour prior  to stick and cover with plastic wrap 07/23/20  Yes Ladell Pier, MD  lipase/protease/amylase (CREON) 36000 UNITS CPEP capsule Take 2 capsules (72,000 Units total) by mouth 3 (three) times daily with meals. May also take 1 capsule (36,000 Units total) as needed. Patient taking differently: Take 2 capsules (72,000 Units total) by mouth 3 (three) times daily with meals. May also take 1 capsule (36,000 Units total) as needed for diarrhea 08/06/21  Yes Owens Shark, NP  loperamide (IMODIUM) 2 MG capsule Take 2-4 mg by mouth 3 (three) times daily as needed for diarrhea or loose stools.   Yes [provider]  loratadine (CLARITIN) 10 MG tablet Take 10 mg by mouth daily as needed for itching (post-treatment).   Yes [provider]  magnesium oxide (MAG-OX) 400 MG tablet Take 1 tablet (400 mg total) by mouth 2 (two) times daily. 06/26/21  Yes [name]  K, NP  ondansetron (ZOFRAN) 8 MG tablet Take 1 tablet (8 mg total) by mouth every 8 (eight) hours as needed for nausea or vomiting. 07/23/20  Yes Ladell Pier, MD  pantoprazole (PROTONIX) 40 MG tablet Take 1 tablet (40 mg total) by mouth 2 (two) times daily. 09/01/21  Yes Patrecia Pour, MD  pioglitazone (ACTOS) 45 MG tablet Take 45 mg by mouth daily. 12/28/13  Yes [provider]  prochlorperazine (COMPAZINE) 10 MG tablet Take 1 tablet (10 mg total) by mouth every 6 (six) hours as needed for nausea. 07/23/20  Yes Ladell Pier, MD  propranolol (INDERAL) 10 MG tablet Take 1 tablet (10 mg total) by mouth 2 (two) times daily. Patient taking differently: Take 10 mg by mouth 2 (two) times daily as needed (only if Systolic number is 496 or greater). 09/01/21  Yes Patrecia Pour, MD  Accu-Chek FastClix Lancets MISC Apply topically. 08/30/20   [provider]  ACCU-CHEK GUIDE test strip  08/31/20   [provider]  Blood Glucose Monitoring Suppl (ACCU-CHEK GUIDE) w/Device KIT See admin instructions. 08/03/20   [provider]  tadalafil (CIALIS) 20 MG tablet Take 20 mg by mouth daily as needed for erectile dysfunction. 06/11/21   [provider]      VITAL SIGNS:  Blood pressure 119/78, pulse 75, temperature 98.4 F (36.9 C), temperature source Oral, resp. rate 15, height '5\' 8"'  (1.727 m), weight 61 kg, SpO2 100 %.  PHYSICAL EXAMINATION:  Physical Exam  GENERAL:  75 y.o.-year-old African-American male patient lying in the bed with no acute distress.  EYES: Pupils equal, round, reactive to light and accommodation.  Positive pallor.  No scleral icterus. Extraocular muscles intact.  HEENT: Head atraumatic, normocephalic. Oropharynx and nasopharynx clear.  NECK:  Supple, no jugular venous distention. No thyroid enlargement, no tenderness.  LUNGS: Normal breath sounds bilaterally, no wheezing, rales,rhonchi or crepitation. No use of accessory muscles of respiration.  CARDIOVASCULAR: Regular rate and rhythm, S1, S2 normal. No murmurs, rubs, or gallops.  ABDOMEN: Soft, nondistended, nontender. Bowel sounds present. No organomegaly or mass.  EXTREMITIES: No pedal edema, cyanosis, or clubbing.  NEUROLOGIC: Cranial nerves II through XII are intact. Muscle strength 5/5 in all extremities. Sensation intact. Gait not checked.  PSYCHIATRIC: The patient is alert and oriented x 3.  Normal affect and good eye contact. SKIN: No obvious rash, lesion, or ulcer.   LABORATORY PANEL:   CBC Recent Labs  Lab 09/15/21 1335  WBC 13.1*  HGB 7.2*  HCT 22.9*  PLT 53*   ------------------------------------------------------------------------------------------------------------------  Chemistries  Recent Labs  Lab 09/15/21 1341  NA 133*  K 4.2  CL 109  CO2 19*  GLUCOSE 246*  BUN 31*  CREATININE 1.22  CALCIUM 8.0*  AST 35  ALT 61*  ALKPHOS 286*  BILITOT 1.1   ------------------------------------------------------------------------------------------------------------------  Cardiac Enzymes No  results for input(s): TROPONINI in the last 168 hours. ------------------------------------------------------------------------------------------------------------------  RADIOLOGY:  DG Chest Port 1 View  Result Date: 09/15/2021 CLINICAL DATA:  Evaluate for pulmonary edema. EXAM: PORTABLE CHEST 1 VIEW COMPARISON:  Chest CT dated 08/26/2021. FINDINGS: Left-sided Port-A-Cath with tip at the cavoatrial junction. No focal consolidation, pleural effusion, or pneumothorax. Faint nodular densities likely corresponding to the density seen on the CT. The cardiac silhouette is within normal limits. No acute osseous pathology. IMPRESSION: No acute cardiopulmonary process. Electronically Signed   By: Anner Crete M.D.   On: 09/15/2021 19:13      IMPRESSION AND  PLAN:  Active Problems:   GI bleeding  1.  GI bleeding likely of upper GI etiology but given thrombocytopenia, I cannot rule out lower GI etiology.  The patient has subsequent symptomatic acute blood loss anemia. - The patient will be admitted to telemetry bed. - He was typed and crossmatched and will be transfused 1 unit of packed red blood cells. - We will follow posttransfusion H&H. - We will place him on IV Protonix drip as well as IV octreotide drip. - GI consultation will be obtained. - I notified Dr. Paulita Fujita about the patient. - If he has no recurrent bleeding likely conservative measures will be pursued.  2.  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 - Pain management to be provided as needed. - Palliative care consult was requested. - We will continue his Creon  3.  Type 2 diabetes mellitus. - The patient will be placed on supplement coverage with NovoLog. - I will continue his Actos.  4.  GERD. - The patient will be placed on IV PPI therapy as mentioned above we will continue his Pepcid.  5.  Esophageal varices - His propanolol be continued. - He will be placed on IV octreotide as mentioned  above.  6.  Thrombocytopenia. - We will follow his platelets. - Hematology consultation can be obtained if needed.  DVT prophylaxis: SCDs.  Medical prophylaxis currently contraindicated due to GI bleeding and thrombocytopenia. Code Status: full code.  When discussed with him he wanted to think about it with his wife. Family Communication:  The plan of care was discussed in details with the patient (and family). I answered all questions. The patient agreed to proceed with the above mentioned plan. Further management will depend upon hospital course. Disposition Plan: Back to previous home environment Consults called: Gastroenterology and palliative care. All the records are reviewed and case discussed with ED provider.  Status is: Inpatient.  Remains inpatient appropriate because:Ongoing diagnostic testing needed not appropriate for outpatient work up, Unsafe d/c plan, IV treatments appropriate due to intensity of illness or inability to take PO, and Inpatient level of care appropriate due to severity of illness   Dispo: The patient is from: Home              Anticipated d/c is to: Home              Patient currently is not medically stable to d/c.              Difficult to place patient: No  TOTAL TIME TAKING CARE OF THIS PATIENT: 55 minutes.     Christel Mormon M.D on 09/15/2021 at 7:45 PM  [facility]   From 7 PM-7 AM, contact night-coverage [email or web address]  CC: Primary care physician; [name], [name], MD

## 2021-09-15 NOTE — ED Triage Notes (Signed)
Pt reports emesis with blood and rectal bleeding that started on Saturday.   Hx: Pancreatic CA and last chemo tx last Tuesday.   Pt had blood work- Hemoglobin-6.6  Platelets- 44, at [facility] and sent him to this ED.   A/Ox4 Wheelchair in triage

## 2021-09-15 NOTE — Patient Instructions (Signed)
Romiplostim injection What is this medication? ROMIPLOSTIM (roe mi PLOE stim) helps your body make more platelets. This medicine is used to treat low platelets caused by chronic idiopathic thrombocytopenic purpura (ITP) or a bone marrow syndrome caused by radiation sickness. This medicine may be used for other purposes; ask your health care provider or pharmacist if you have questions. COMMON BRAND NAME(S): Nplate What should I tell my care team before I take this medication? They need to know if you have any of these conditions: blood clots myelodysplastic syndrome an unusual or allergic reaction to romiplostim, mannitol, other medicines, foods, dyes, or preservatives pregnant or trying to get pregnant breast-feeding How should I use this medication? This medicine is injected under the skin. It is given by a health care provider in a hospital or clinic setting. A special MedGuide will be given to you before each treatment. Be sure to read this information carefully each time. Talk to your health care provider about the use of this medicine in children. While it may be prescribed for children as young as newborns for selected conditions, precautions do apply. Overdosage: If you think you have taken too much of this medicine contact a poison control center or emergency room at once. NOTE: This medicine is only for you. Do not share this medicine with others. What if I miss a dose? Keep appointments for follow-up doses. It is important not to miss your dose. Call your health care provider if you are unable to keep an appointment. What may interact with this medication? Interactions are not expected. This list may not describe all possible interactions. Give your health care provider a list of all the medicines, herbs, non-prescription drugs, or dietary supplements you use. Also tell them if you smoke, drink alcohol, or use illegal drugs. Some items may interact with your medicine. What should I  watch for while using this medication? Visit your health care provider for regular checks on your progress. You may need blood work done while you are taking this medicine. Your condition will be monitored carefully while you are receiving this medicine. It is important not to miss any appointments. What side effects may I notice from receiving this medication? Side effects that you should report to your doctor or health care professional as soon as possible: allergic reactions (skin rash, itching or hives; swelling of the face, lips, or tongue) bleeding (bloody or black, tarry stools; red or dark brown urine; spitting up blood or brown material that looks like coffee grounds; red spots on the skin; unusual bruising or bleeding from the eyes, gums, or nose) blood clot (chest pain; shortness of breath; pain, swelling, or warmth in the leg) stroke (changes in vision; confusion; trouble speaking or understanding; severe headaches; sudden numbness or weakness of the face, arm or leg; trouble walking; dizziness; loss of balance or coordination) Side effects that usually do not require medical attention (report to your doctor or health care professional if they continue or are bothersome): diarrhea dizziness headache joint pain muscle pain stomach pain trouble sleeping This list may not describe all possible side effects. Call your doctor for medical advice about side effects. You may report side effects to FDA at 1-800-FDA-1088. Where should I keep my medication? This medicine is given in a hospital or clinic. It will not be stored at home. NOTE: This sheet is a summary. It may not cover all possible information. If you have questions about this medicine, talk to your doctor, pharmacist, or health care provider.    2022 Elsevier/Gold Standard (2019-12-17 10:28:13)  

## 2021-09-16 ENCOUNTER — Encounter (HOSPITAL_COMMUNITY): Payer: Self-pay | Admitting: Family Medicine

## 2021-09-16 DIAGNOSIS — G629 Polyneuropathy, unspecified: Secondary | ICD-10-CM

## 2021-09-16 DIAGNOSIS — K922 Gastrointestinal hemorrhage, unspecified: Secondary | ICD-10-CM

## 2021-09-16 DIAGNOSIS — M549 Dorsalgia, unspecified: Secondary | ICD-10-CM

## 2021-09-16 DIAGNOSIS — I10 Essential (primary) hypertension: Secondary | ICD-10-CM

## 2021-09-16 DIAGNOSIS — D696 Thrombocytopenia, unspecified: Secondary | ICD-10-CM

## 2021-09-16 DIAGNOSIS — R109 Unspecified abdominal pain: Secondary | ICD-10-CM

## 2021-09-16 DIAGNOSIS — E119 Type 2 diabetes mellitus without complications: Secondary | ICD-10-CM

## 2021-09-16 DIAGNOSIS — C259 Malignant neoplasm of pancreas, unspecified: Secondary | ICD-10-CM

## 2021-09-16 DIAGNOSIS — D649 Anemia, unspecified: Secondary | ICD-10-CM

## 2021-09-16 LAB — CBC WITH DIFFERENTIAL/PLATELET
Abs Immature Granulocytes: 0.14 10*3/uL — ABNORMAL HIGH (ref 0.00–0.07)
Basophils Absolute: 0.1 10*3/uL (ref 0.0–0.1)
Basophils Relative: 1 %
Eosinophils Absolute: 0.3 10*3/uL (ref 0.0–0.5)
Eosinophils Relative: 5 %
HCT: 21.1 % — ABNORMAL LOW (ref 39.0–52.0)
Hemoglobin: 6.7 g/dL — CL (ref 13.0–17.0)
Immature Granulocytes: 2 %
Lymphocytes Relative: 10 %
Lymphs Abs: 0.6 10*3/uL — ABNORMAL LOW (ref 0.7–4.0)
MCH: 28.9 pg (ref 26.0–34.0)
MCHC: 31.8 g/dL (ref 30.0–36.0)
MCV: 90.9 fL (ref 80.0–100.0)
Monocytes Absolute: 0.3 10*3/uL (ref 0.1–1.0)
Monocytes Relative: 5 %
Neutro Abs: 4.5 10*3/uL (ref 1.7–7.7)
Neutrophils Relative %: 77 %
Platelets: 40 10*3/uL — ABNORMAL LOW (ref 150–400)
RBC: 2.32 MIL/uL — ABNORMAL LOW (ref 4.22–5.81)
RDW: 19.8 % — ABNORMAL HIGH (ref 11.5–15.5)
WBC: 5.8 10*3/uL (ref 4.0–10.5)
nRBC: 0 % (ref 0.0–0.2)

## 2021-09-16 LAB — URINALYSIS, ROUTINE W REFLEX MICROSCOPIC
Bilirubin Urine: NEGATIVE
Glucose, UA: NEGATIVE mg/dL
Hgb urine dipstick: NEGATIVE
Ketones, ur: NEGATIVE mg/dL
Leukocytes,Ua: NEGATIVE
Nitrite: NEGATIVE
Protein, ur: NEGATIVE mg/dL
Specific Gravity, Urine: 1.015 (ref 1.005–1.030)
pH: 5 (ref 5.0–8.0)

## 2021-09-16 LAB — CBC
HCT: 18.3 % — ABNORMAL LOW (ref 39.0–52.0)
Hemoglobin: 5.8 g/dL — CL (ref 13.0–17.0)
MCH: 29.6 pg (ref 26.0–34.0)
MCHC: 31.7 g/dL (ref 30.0–36.0)
MCV: 93.4 fL (ref 80.0–100.0)
Platelets: 39 10*3/uL — ABNORMAL LOW (ref 150–400)
RBC: 1.96 MIL/uL — ABNORMAL LOW (ref 4.22–5.81)
RDW: 19.9 % — ABNORMAL HIGH (ref 11.5–15.5)
WBC: 5.7 10*3/uL (ref 4.0–10.5)
nRBC: 0 % (ref 0.0–0.2)

## 2021-09-16 LAB — BASIC METABOLIC PANEL
Anion gap: 6 (ref 5–15)
BUN: 25 mg/dL — ABNORMAL HIGH (ref 8–23)
CO2: 17 mmol/L — ABNORMAL LOW (ref 22–32)
Calcium: 7.4 mg/dL — ABNORMAL LOW (ref 8.9–10.3)
Chloride: 111 mmol/L (ref 98–111)
Creatinine, Ser: 1.08 mg/dL (ref 0.61–1.24)
GFR, Estimated: >60 mL/min (ref >60)
Glucose, Bld: 114 mg/dL — ABNORMAL HIGH (ref 70–99)
Potassium: 3.8 mmol/L (ref 3.5–5.1)
Sodium: 134 mmol/L — ABNORMAL LOW (ref 135–145)

## 2021-09-16 LAB — GLUCOSE, CAPILLARY
Glucose-Capillary: 124 mg/dL — ABNORMAL HIGH (ref 70–99)
Glucose-Capillary: 87 mg/dL (ref 70–99)
Glucose-Capillary: 105 mg/dL — ABNORMAL HIGH (ref 70–99)

## 2021-09-16 LAB — PREPARE RBC (CROSSMATCH)

## 2021-09-16 LAB — CBG MONITORING, ED: Glucose-Capillary: 116 mg/dL — ABNORMAL HIGH (ref 70–99)

## 2021-09-16 MED ORDER — SODIUM CHLORIDE 0.9% IV SOLUTION: Freq: Once | INTRAVENOUS | Status: AC

## 2021-09-16 MED ORDER — CHLORHEXIDINE GLUCONATE CLOTH 2 % EX PADS
6.0000 | MEDICATED_PAD | Freq: Every day | CUTANEOUS | Status: DC
  Administered 2021-09-16 – 2021-09-17 (×2): 6 via TOPICAL

## 2021-09-16 NOTE — Progress Notes (Signed)
IP PROGRESS NOTE  Subjective:   Mr. Monje was transfused with packed red blood cells on 09/11/2021.  He developed bleeding beginning on 09/12/2021.  He presented to the emergency room yesterday.  He reports black stool.  He had nausea following the cycle of FOLFIRI on 09/08/2021.  He had hematemesis.  Objective: Vital signs in last 24 hours: Blood pressure (!) 91/50, pulse 74, temperature 98.4 F (36.9 C), temperature source Oral, resp. rate 20, height '5\' 8"'  (1.727 m), weight 134 lb 7.7 oz (61 kg), SpO2 100 %.  Intake/Output from previous day: No intake/output data recorded.  Physical Exam:  HEENT: No thrush or ulcers Lungs: Clear bilaterally Cardiac: Regular rate and rhythm Abdomen: No mass, no hepatosplenomegaly, nontender Extremities: No leg edema   Portacath/PICC-without erythema  Lab Results: Recent Labs    09/15/21 1335 09/16/21 0435  WBC 13.1* 5.7  HGB 7.2* 5.8*  HCT 22.9* 18.3*  PLT 53* 39*    BMET Recent Labs    09/15/21 1341 09/16/21 0435  NA 133* 134*  K 4.2 3.8  CL 109 111  CO2 19* 17*  GLUCOSE 246* 114*  BUN 31* 25*  CREATININE 1.22 1.08  CALCIUM 8.0* 7.4*    Lab Results  Component Value Date   [id number] 3,411 (H) 08/20/2021    Studies/Results: DG Chest Port 1 View  Result Date: 09/15/2021 CLINICAL DATA:  Evaluate for pulmonary edema. EXAM: PORTABLE CHEST 1 VIEW COMPARISON:  Chest CT dated 08/26/2021. FINDINGS: Left-sided Port-A-Cath with tip at the cavoatrial junction. No focal consolidation, pleural effusion, or pneumothorax. Faint nodular densities likely corresponding to the density seen on the CT. The cardiac silhouette is within normal limits. No acute osseous pathology. IMPRESSION: No acute cardiopulmonary process. Electronically Signed   By: Anner Crete M.D.   On: 09/15/2021 19:13    Medications: I have reviewed the patient's current medications.  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, mild biliary ductal dilatation, diffuse hypodense/hypoenhancing areas within the liver (edema versus infiltrating neoplasm), 2 x 1.7 cm area of focal prominence in the pancreas EUS 07/10/2020-no pancreas mass identified, 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, 1.5 cm aortocaval node biopsy-scant lymphoid material, no malignancy Diagnostic laparoscopy 07/17/2020-segment 3 and 4 liver nodules, adenocarcinoma, positive for pankeratin, CK7 and CK20, partially positive for TTF-1, negative for CDX2 Foundation 1-microsatellite stable, tumor mutation burden 4, K-ras G12D, subclonal RB1 alteration Elevated CA 19-9 Cycle 1 FOLFOX 07/28/2020 Cycle 2 FOLFOX 08/18/2020, oxaliplatin dose reduced due to thrombocytopenia Cycle 3 FOLFOX 09/08/2020 Cycle 4 FOLFOX 09/29/2020  Cycle 5 FOLFOX 10/20/2020 CTs 11/06/2020-grossly stable infiltrative mass within the pancreatic head and porta hepatis.  New small low-density liver lesions.  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 Cycle 6 FOLFOX 11/10/2021  Cycle 7 FOLFOX 12/01/2020 MRI liver 12/04/2020-no significant change in posttreatment appearance of the pancreatic head.  Numerous intrinsically low signal hypoenhancing lesions of the liver parenchyma predominantly observed in the right lobe of the liver and liver dome, measuring no greater than 8 mm and as seen on recent prior CT of the abdomen/pelvis. Cycle 8 FOLFOX 12/22/2020  Cycle 9 FOLFOX 01/12/2021 Cycle 10 FOLFOX 02/02/2021 MRI liver 02/19/2021-no change in pancreas  head mass, multiple hypoenhancing liver lesions are new and increased in size, effacement of portal vein by pancreas head mass with cavernous transformation, no change in mild splenomegaly Cycle 1 gemcitabine/Abraxane 03/12/2021 Cycle 2 gemcitabine/Abraxane 03/27/2021- dose reductions secondary to neutropenia Cycle 3 gemcitabine/Abraxane 04/17/2021-dose reductions due to thrombocytopenia, white cell growth factor support added Cycle 4 gemcitabine/Abraxane 05/01/2021, Ziextenzo Cycle 5 gemcitabine/Abraxane 05/15/2021, Ziextenzo MRI abdomen 05/26/2021-mild enlargement of dominant hepatic metastases, appearance of infiltrative pancreas mass extending to the celiac trunk and SMA, splenic vein occlusion, cavernous transformation of the portal vein with upper abdomen collaterals, splenomegaly, mild upper abdominal ascites and mesenteric edema, minimal subpleural nodularity left lower lobe Cycle 6 gemcitabine/Abraxane 05/28/2021 Cycle 7 gemcitabine/Abraxane 06/12/2021 Cycle 8 gemcitabine/Abraxane 06/26/2021 Cycle 9 gemcitabine/Abraxane 07/10/2021 Cycle 10 gemcitabine/Abraxane 07/23/2021 Cycle 11 gemcitabine/Abraxane 08/06/2021 MRI abdomen 08/20/2021-multifocal nodularity in the lower chest with a new discrete right lower lobe nodule, enlarging subsegment 7/8 liver lesion, other small lesions appear stable, gastric varices, review of images with radiology-enlargement of at least 2 liver lesions, suspicious nodule n the right lower lung Cycle one 5-FU/liposomal irinotecan 09/08/2021   Chronic thrombocytopenia- weekly Nplate beginning 5/92/9244 Diabetes Hypertension Abdomen/back pain 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   Admission 08/27/2021 with GI bleeding Upper endoscopy 08/27/2021-grade 1 esophageal varices, gastric varices without bleeding CT angiogram  abdomen/pelvis 08/27/2021-extensive paraesophageal and gastric varices with chronic portal venous occlusion Colonoscopy 08/29/2021- inflamed, friable colonic mucosa with contact bleeding 9.   Admission with recurrent GI bleeding and severe anemia 09/15/2021  [name] has metastatic pancreas cancer.  He is now at day 8 following cycle 1 of salvage treatment with FOLFIRI.  He is again admitted with GI bleeding and severe anemia.  He is being transfused with packed red blood cells.  I suspect the bleeding is related to portal hypertension/varices.  No direct tumor involvement of the GI tract was noted on endoscopic evaluation last month.  I doubt the bleeding is related to chemotherapy enteritis or colitis.  He has moderate thrombocytopenia, also unlikely to result in bleeding.  Mr. Siefert has chronic thrombocytopenia, likely related to liver disease and portal hypertension.  The thrombocytopenia has responded to Nplate in the past.  He received Nplate yesterday.  Recommendations: Transfusion as needed for severe anemia Reconsult gastroenterology to evaluate for a source of bleeding and intervention if possible Follow platelet count, weekly Nplate Oncology will continue following Mr. Dyar in the hospital.  Outpatient follow-up as scheduled with Cancer center.     LOS: 1 day   Betsy Coder, MD   09/16/2021, 6:29 AM

## 2021-09-16 NOTE — Consult Note (Addendum)
Referring Provider: Dr. Eugenie Norrie Primary Care Physician:  [name], Carlean Jews.Marlou Sa, MD Primary Gastroenterologist: Dr. Lawrence Santiago  Reason for Consultation:  Anemia and rectal bleeding  HPI: Aaron Johnston is a 75 y.o. 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 Benay Spice.   Patient was here 08/27/2021 for anemia and rectal bleeding. At that time patient had endoscopy and colonoscopy inpatient that showed grade 1 esophageal varices, small hiatal hernia on endoscopy otherwise normal.  Anoscopy showed focal active colitis throughout the entire examined colon, internal hemorrhoids.  Pathology showed neutrophilic inflammation nonspecific pattern could be related to anti-inflammatories.  Crohn's cannot be ruled out.  Patient presented to the ED 09/15/2021 with maroon-colored stool and hematemesis over the weekend.   Patient found to have hemoglobin 7.2 in the ER Patient is being treated for malignant pancreatic adenocarcinoma, was supposed to have injection for thrombocytopenia today.  History is provided by wife in the room inpatient. Patient denies any further bleeding since last admission until this past Saturday. Patient had 1 episode of bloody emesis with dark maroon vomit some food and it.   Had some continuing nausea Saturday and Sunday without any further vomiting.  Patient's been on pantoprazole twice daily, denies reflux, dysphagia. Patient then proceeded to have 2-3 bowel movements a day loose, dark maroon stools with some black clots. Patient has had 2 bowel movements here, bedside commode had stool in it, very loose maroon stool with clots in it. Denies fever, chills.  Denies any abdominal pain.  Denies rectal pain. Denies any sick contacts.  Patient states he has had diarrhea 4 to 5 times daily since being diagnosed with pancreatic cancer, currently on  Creon,.   No NSAIDS. Patient is not on blood thinners. Denies ETOH. Remote history of smoking.  Declines iron or pepto use.  Patient has family history of pancreatic cancer in father, family history of breast cancer.   Patient denies family history of colon cancer. Patient does have a nephew with Crohn's disease, but denies other autoimmune diseases.  EGD 08/27/2021 - Small hiatal hernia. - Grade I esophageal varices. - Gastric varices, without bleeding. - Normal ampulla, duodenal bulb, first portion of the duodenum, second portion of the duodenum, major papilla, area of the papilla and third portion of the duodenum. - The examination was otherwise normal. - No specimens collected.  Colonoscopy 08/29/2021 - The examined portion of the ileum was normal. - Congested, erythematous, friable (with contact bleeding) and inflamed mucosa in the entire examined colon. Biopsied. - Internal hemorrhoids. PATHOLOGY A. COLON, RANDOM, BIOPSY:  - Focal active colitis.  - There is a microscopic focus with mild active neutrophilic inflammation.  The pattern is nonspecific and possibilities include NSAID related  inflammation.  Crohn's cannot be ruled out.  There are no granulomas  present.   CTA AB and Pelvis 08/27/21 IMPRESSION: 1. The stomach is moderately distended, possible intraluminal clot if no recent meal. No active intraluminal hemorrhage detected. 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 2. Ileal and proximal colonic wall edema which could be colitis or portal congestion. 3. Infiltrating pancreas mass, reference recent staging MRI of the abdomen   MRCP 08/19/2021 IMPRESSION: Locally advanced and metastatic pancreatic cancer with signs of worsening of dominant lesions in the liver and with suspected pulmonary metastatic disease. Consider chest CT for further evaluation. Increasing size of the spleen, attention  on  follow-up now approximately 16 cm as compared to 15 cm greatest craniocaudal dimension. Gastric varices. Cholelithiasis and sludge with attenuation of the common bile duct but without central dilation, attention on follow-up.  Past Medical History:  Diagnosis Date   Diabetes mellitus without complication ([place])    Family history of breast cancer    Family history of colon cancer    Family history of pancreatic cancer    Family history of stomach cancer     Past Surgical History:  Procedure Laterality Date   BIOPSY  08/29/2021   Procedure: BIOPSY;  Surgeon: Otis Brace, MD;  Location: [facility];  Service: Gastroenterology;;   COLONOSCOPY WITH PROPOFOL N/A 08/29/2021   Procedure: COLONOSCOPY WITH PROPOFOL;  Surgeon: Otis Brace, MD;  Location: [facility];  Service: Gastroenterology;  Laterality: N/A;   ESOPHAGOGASTRODUODENOSCOPY (EGD) WITH PROPOFOL N/A 08/27/2021   Procedure: ESOPHAGOGASTRODUODENOSCOPY (EGD) WITH PROPOFOL;  Surgeon: Clarene Essex, MD;  Location: [facility];  Service: Endoscopy;  Laterality: N/A;    Prior to Admission medications   Medication Sig Start Date End Date Taking? Authorizing Provider  diphenhydrAMINE (BENADRYL) 50 MG capsule Take 50 mg by mouth daily as needed for itching (post-treatment).   Yes [provider]  diphenoxylate-atropine (LOMOTIL) 2.5-0.025 MG tablet Take 1-2 tablets by mouth 4 (four) times daily as needed for diarrhea or loose stools. 09/08/21  Yes Owens Shark, NP  famotidine (PEPCID) 10 MG tablet Take 10 mg by mouth daily as needed (for 1 week after each treatment, for itching).   Yes [provider]  HYDROcodone-acetaminophen (NORCO/VICODIN) 5-325 MG tablet Take 1-2 tablets by mouth every 4 (four) hours as needed. Patient taking differently: Take 1-2 tablets by mouth every 4 (four) hours as needed (for pain). 03/12/21  Yes Owens Shark, NP  KLOR-CON M20 20 MEQ tablet TAKE 1 TABLET BY MOUTH EVERY  DAY Patient taking differently: Take 20 mEq by mouth daily. 09/02/21  Yes Ladell Pier, MD  latanoprost (XALATAN) 0.005 % ophthalmic solution Place 1 drop into both eyes at bedtime.   Yes [provider]  lidocaine-prilocaine (EMLA) cream Apply 1 application topically as directed. Apply to port site 1 hour prior to stick and cover with plastic wrap 07/23/20  Yes Ladell Pier, MD  lipase/protease/amylase (CREON) 36000 UNITS CPEP capsule Take 2 capsules (72,000 Units total) by mouth 3 (three) times daily with meals. May also take 1 capsule (36,000 Units total) as needed. Patient taking differently: Take 2 capsules (72,000 Units total) by mouth 3 (three) times daily with meals. May also take 1 capsule (36,000 Units total) as needed for diarrhea 08/06/21  Yes Owens Shark, NP  loperamide (IMODIUM) 2 MG capsule Take 2-4 mg by mouth 3 (three) times daily as needed for diarrhea or loose stools.   Yes [provider]  loratadine (CLARITIN) 10 MG tablet Take 10 mg by mouth daily as needed for itching (post-treatment).   Yes [provider]  magnesium oxide (MAG-OX) 400 MG tablet Take 1 tablet (400 mg total) by mouth 2 (two) times daily. 06/26/21  Yes Owens Shark, NP  ondansetron (ZOFRAN) 8 MG tablet Take 1 tablet (8 mg total) by mouth every 8 (eight) hours as needed for nausea or vomiting. 07/23/20  Yes Ladell Pier, MD  pantoprazole (PROTONIX) 40 MG tablet Take 1 tablet (40 mg total) by mouth 2 (two) times daily. 09/01/21  Yes Patrecia Pour, MD  pioglitazone (ACTOS) 45 MG tablet Take 45 mg by  mouth daily. 12/28/13  Yes [provider]  prochlorperazine (COMPAZINE) 10 MG tablet Take 1 tablet (10 mg total) by mouth every 6 (six) hours as needed for nausea. 07/23/20  Yes Ladell Pier, MD  propranolol (INDERAL) 10 MG tablet Take 1 tablet (10 mg total) by mouth 2 (two) times daily. Patient taking differently: Take 10 mg by mouth 2 (two) times daily as needed (only if  Systolic number is 622 or greater). 09/01/21  Yes Patrecia Pour, MD  Accu-Chek FastClix Lancets MISC Apply topically. 08/30/20   [provider]  ACCU-CHEK GUIDE test strip  08/31/20   [provider]  Blood Glucose Monitoring Suppl (ACCU-CHEK GUIDE) w/Device KIT See admin instructions. 08/03/20   [provider]  tadalafil (CIALIS) 20 MG tablet Take 20 mg by mouth daily as needed for erectile dysfunction. 06/11/21   [provider]    Scheduled Meds:  insulin aspart  0-9 Units Subcutaneous TID AC & HS   latanoprost  1 drop Both Eyes QHS   lipase/protease/amylase  72,000 Units Oral TID WC   magnesium oxide  400 mg Oral BID   [START ON 09/19/2021] pantoprazole  40 mg Intravenous Q12H   pioglitazone  45 mg Oral Daily   potassium chloride SA  20 mEq Oral Daily   Continuous Infusions:  sodium chloride 100 mL/hr at 09/16/21 0744   octreotide  (SANDOSTATIN)    IV infusion 50 mcg/hr (09/16/21 0743)   pantoprazole 8 mg/hr (09/16/21 0744)   PRN Meds:.acetaminophen **OR** acetaminophen, diphenhydrAMINE, diphenoxylate-atropine, famotidine, HYDROcodone-acetaminophen, lidocaine-prilocaine, loperamide, loratadine, magnesium hydroxide, ondansetron **OR** ondansetron (ZOFRAN) IV, prochlorperazine, propranolol, traZODone  Allergies as of 09/15/2021   (No Known Allergies)    Family History  Problem Relation Age of Onset   Diabetes Mother    Pancreatic cancer Father        d. 52   Breast cancer Sister 97   Diabetes Sister    Diabetes Brother    Stomach cancer Paternal Aunt        2 pat aunts with stomach cancer   Colon cancer Paternal Uncle    Cancer Paternal Grandfather        NOS   Diabetes Sister    Diabetes Brother    Brain cancer Paternal Aunt    Cancer Paternal Aunt        eye   Diabetes Son     Social History   Socioeconomic History   Marital status: Married    Spouse name: Not on file   Number of children: Not on file   Years of education:  Not on file   Highest education level: Not on file  Occupational History   Not on file  Tobacco Use   Smoking status: Former    Types: Cigarettes    Quit date: 08/17/1980    Years since quitting: 41.1   Smokeless tobacco: Never  Vaping Use   Vaping Use: Never used  Substance and Sexual Activity   Alcohol use: Not Currently   Drug use: Never   Sexual activity: Not on file  Other Topics Concern   Not on file  Social History Narrative   Not on file   Social Determinants of Health   Financial Resource Strain: Not on file  Food Insecurity: Not on file  Transportation Needs: Not on file  Physical Activity: Not on file  Stress: Not on file  Social Connections: Not on file  Intimate Partner Violence: Not on file    Review of  Systems:  Review of Systems  Constitutional:  Positive for malaise/fatigue. Negative for chills and fever.  Respiratory:  Negative for shortness of breath.   Cardiovascular:  Negative for chest pain and leg swelling.  Gastrointestinal:  Positive for blood in stool, diarrhea, melena, nausea and vomiting. Negative for abdominal pain, constipation and heartburn.  Musculoskeletal:  Negative for falls.  Skin:  Negative for rash.  Neurological:  Negative for loss of consciousness.  Psychiatric/Behavioral:  Negative for memory loss.     Physical Exam: Vital signs: Vitals:   09/16/21 0600 09/16/21 0634  BP: (!) 91/50 (!) 90/53  Pulse: 74 76  Resp: 20 18  Temp: 98.4 F (36.9 C) 98.2 F (36.8 C)  SpO2: 100%      General:   Alert, skinny appearing male in NAD Heart:  Regular rate and rhythm; no murmurs Pulm: Clear anteriorly; no wheezing Abdomen:  Soft, Flat AB, skin exam normal, Normal bowel sounds. mild tenderness in the epigastrium. Without guarding and Without rebound, hepatomegaly noted. Extremities:  Without edema. Neurologic:  Alert and  oriented x4;  grossly normal neurologically. Psych:  Alert and cooperative. Normal mood and affect.  GI:   Lab Results: Recent Labs    09/15/21 1036 09/15/21 1335 09/16/21 0435  WBC 10.4 13.1* 5.7  HGB 6.6* 7.2* 5.8*  HCT 20.6* 22.9* 18.3*  PLT 47* 53* 39*   BMET Recent Labs    09/15/21 1341 09/16/21 0435  NA 133* 134*  K 4.2 3.8  CL 109 111  CO2 19* 17*  GLUCOSE 246* 114*  BUN 31* 25*  CREATININE 1.22 1.08  CALCIUM 8.0* 7.4*   LFT Recent Labs    09/15/21 1341  PROT 5.8*  ALBUMIN 3.2*  AST 35  ALT 61*  ALKPHOS 286*  BILITOT 1.1   PT/INR Recent Labs    09/15/21 1341  LABPROT 15.3*  INR 1.2    Studies/Results: DG Chest Port 1 View  Result Date: 09/15/2021 CLINICAL DATA:  Evaluate for pulmonary edema. EXAM: PORTABLE CHEST 1 VIEW COMPARISON:  Chest CT dated 08/26/2021. FINDINGS: Left-sided Port-A-Cath with tip at the cavoatrial junction. No focal consolidation, pleural effusion, or pneumothorax. Faint nodular densities likely corresponding to the density seen on the CT. The cardiac silhouette is within normal limits. No acute osseous pathology. IMPRESSION: No acute cardiopulmonary process. Electronically Signed   By: Anner Crete M.D.   On: 09/15/2021 19:13    Impression Malignant pancreatic adenocarcinoma  Currently on chemotherapy following with Dr. Ammie Dalton - Cycle 11 gemcitabine/Abraxane 08/06/2021 -Cycle one 5-FU/liposomal irinotecan 09/08/2021   Metastatic disease to the liver  -with chronic venous occlusion, portal hypertension and gastric varices AST 35 ALT 61 Alkphos 286 TBili 1.1 GFR >60  INR 1.2- Normal this admission   Symptomatic anemia Baseline appears to be around 8.5-9, down to 7.2 then 5.8 during this admission WBC 5.7 HGB 5.8 MCV 93.4 Platelets 39 Iron 36 Ferritin 321 B12 1,947 10/25 BUN 23 Cr 1.16, 11/01 BUN 31, Ct 1.22 Patient did have slight increase in BUN this admission indicating potential upper GI bleed.  Thrombocytopenia in setting of chemotherapy Platelets of 39 can be contributing to this bleeding   Rectal bleeding   Previous colonoscopy 08/29/2021 on last admission for anemia and rectal bleeding showed acute colitis however patient's not on anti-inflammatories Varices due to portal congestion, grade 1 on recent EGD.  Possible chemotherapy colitis, may need to rule out infection.   Plan  Monitor platelets- transfuse 2 units- recheck tomorrow AM  -need  above 50,000 to proceed with EGD.   Continue Protonix and octreotide drip  Plan for EGD relook tomorrow with history of varices pending platelet count.   I thoroughly discussed the procedure to include nature, alternatives, benefits, and risks including but not limited to bleeding, perforation, infection, anesthesia/cardiac and pulmonary complications. Patient provides understanding and gave verbal consent to proceed.  Possible chemotherapy induced colitis.  No evidence of ischemic colitis, no AB pain.    Clear liquid diet, NPO at midnight.   Continue daily CBC with transfusion as needed to maintain Hgb >7.    If destabilizing bleeding occurs, recommend CTA followed by IR consultation if positive. Had negative study last visit.   [facility] will follow.    LOS: 1 day   Vladimir Crofts  PA-C 09/16/2021, 8:21 AM  Contact #  [phone]

## 2021-09-16 NOTE — H&P (View-Only) (Signed)
Referring Provider: Dr. Eugenie Norrie Primary Care Physician:  [name], Carlean Jews.Marlou Sa, MD Primary Gastroenterologist: Dr. Lawrence Santiago  Reason for Consultation:  Anemia and rectal bleeding  HPI: Aaron Johnston is a 75 y.o. 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 Benay Spice.   Patient was here 08/27/2021 for anemia and rectal bleeding. At that time patient had endoscopy and colonoscopy inpatient that showed grade 1 esophageal varices, small hiatal hernia on endoscopy otherwise normal.  Anoscopy showed focal active colitis throughout the entire examined colon, internal hemorrhoids.  Pathology showed neutrophilic inflammation nonspecific pattern could be related to anti-inflammatories.  Crohn's cannot be ruled out.  Patient presented to the ED 09/15/2021 with maroon-colored stool and hematemesis over the weekend.   Patient found to have hemoglobin 7.2 in the ER Patient is being treated for malignant pancreatic adenocarcinoma, was supposed to have injection for thrombocytopenia today.  History is provided by wife in the room inpatient. Patient denies any further bleeding since last admission until this past Saturday. Patient had 1 episode of bloody emesis with dark maroon vomit some food and it.   Had some continuing nausea Saturday and Sunday without any further vomiting.  Patient's been on pantoprazole twice daily, denies reflux, dysphagia. Patient then proceeded to have 2-3 bowel movements a day loose, dark maroon stools with some black clots. Patient has had 2 bowel movements here, bedside commode had stool in it, very loose maroon stool with clots in it. Denies fever, chills.  Denies any abdominal pain.  Denies rectal pain. Denies any sick contacts.  Patient states he has had diarrhea 4 to 5 times daily since being diagnosed with pancreatic cancer, currently on  Creon,.   No NSAIDS. Patient is not on blood thinners. Denies ETOH. Remote history of smoking.  Declines iron or pepto use.  Patient has family history of pancreatic cancer in father, family history of breast cancer.   Patient denies family history of colon cancer. Patient does have a nephew with Crohn's disease, but denies other autoimmune diseases.  EGD 08/27/2021 - Small hiatal hernia. - Grade I esophageal varices. - Gastric varices, without bleeding. - Normal ampulla, duodenal bulb, first portion of the duodenum, second portion of the duodenum, major papilla, area of the papilla and third portion of the duodenum. - The examination was otherwise normal. - No specimens collected.  Colonoscopy 08/29/2021 - The examined portion of the ileum was normal. - Congested, erythematous, friable (with contact bleeding) and inflamed mucosa in the entire examined colon. Biopsied. - Internal hemorrhoids. PATHOLOGY A. COLON, RANDOM, BIOPSY:  - Focal active colitis.  - There is a microscopic focus with mild active neutrophilic inflammation.  The pattern is nonspecific and possibilities include NSAID related  inflammation.  Crohn's cannot be ruled out.  There are no granulomas  present.   CTA AB and Pelvis 08/27/21 IMPRESSION: 1. The stomach is moderately distended, possible intraluminal clot if no recent meal. No active intraluminal hemorrhage detected. 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 2. Ileal and proximal colonic wall edema which could be colitis or portal congestion. 3. Infiltrating pancreas mass, reference recent staging MRI of the abdomen   MRCP 08/19/2021 IMPRESSION: Locally advanced and metastatic pancreatic cancer with signs of worsening of dominant lesions in the liver and with suspected pulmonary metastatic disease. Consider chest CT for further evaluation. Increasing size of the spleen, attention  on  follow-up now approximately 16 cm as compared to 15 cm greatest craniocaudal dimension. Gastric varices. Cholelithiasis and sludge with attenuation of the common bile duct but without central dilation, attention on follow-up.  Past Medical History:  Diagnosis Date   Diabetes mellitus without complication ([place])    Family history of breast cancer    Family history of colon cancer    Family history of pancreatic cancer    Family history of stomach cancer     Past Surgical History:  Procedure Laterality Date   BIOPSY  08/29/2021   Procedure: BIOPSY;  Surgeon: Otis Brace, MD;  Location: [facility];  Service: Gastroenterology;;   COLONOSCOPY WITH PROPOFOL N/A 08/29/2021   Procedure: COLONOSCOPY WITH PROPOFOL;  Surgeon: Otis Brace, MD;  Location: [facility];  Service: Gastroenterology;  Laterality: N/A;   ESOPHAGOGASTRODUODENOSCOPY (EGD) WITH PROPOFOL N/A 08/27/2021   Procedure: ESOPHAGOGASTRODUODENOSCOPY (EGD) WITH PROPOFOL;  Surgeon: Clarene Essex, MD;  Location: [facility];  Service: Endoscopy;  Laterality: N/A;    Prior to Admission medications   Medication Sig Start Date End Date Taking? Authorizing Provider  diphenhydrAMINE (BENADRYL) 50 MG capsule Take 50 mg by mouth daily as needed for itching (post-treatment).   Yes [provider]  diphenoxylate-atropine (LOMOTIL) 2.5-0.025 MG tablet Take 1-2 tablets by mouth 4 (four) times daily as needed for diarrhea or loose stools. 09/08/21  Yes Owens Shark, NP  famotidine (PEPCID) 10 MG tablet Take 10 mg by mouth daily as needed (for 1 week after each treatment, for itching).   Yes [provider]  HYDROcodone-acetaminophen (NORCO/VICODIN) 5-325 MG tablet Take 1-2 tablets by mouth every 4 (four) hours as needed. Patient taking differently: Take 1-2 tablets by mouth every 4 (four) hours as needed (for pain). 03/12/21  Yes Owens Shark, NP  KLOR-CON M20 20 MEQ tablet TAKE 1 TABLET BY MOUTH EVERY  DAY Patient taking differently: Take 20 mEq by mouth daily. 09/02/21  Yes Ladell Pier, MD  latanoprost (XALATAN) 0.005 % ophthalmic solution Place 1 drop into both eyes at bedtime.   Yes [provider]  lidocaine-prilocaine (EMLA) cream Apply 1 application topically as directed. Apply to port site 1 hour prior to stick and cover with plastic wrap 07/23/20  Yes Ladell Pier, MD  lipase/protease/amylase (CREON) 36000 UNITS CPEP capsule Take 2 capsules (72,000 Units total) by mouth 3 (three) times daily with meals. May also take 1 capsule (36,000 Units total) as needed. Patient taking differently: Take 2 capsules (72,000 Units total) by mouth 3 (three) times daily with meals. May also take 1 capsule (36,000 Units total) as needed for diarrhea 08/06/21  Yes Owens Shark, NP  loperamide (IMODIUM) 2 MG capsule Take 2-4 mg by mouth 3 (three) times daily as needed for diarrhea or loose stools.   Yes [provider]  loratadine (CLARITIN) 10 MG tablet Take 10 mg by mouth daily as needed for itching (post-treatment).   Yes [provider]  magnesium oxide (MAG-OX) 400 MG tablet Take 1 tablet (400 mg total) by mouth 2 (two) times daily. 06/26/21  Yes Owens Shark, NP  ondansetron (ZOFRAN) 8 MG tablet Take 1 tablet (8 mg total) by mouth every 8 (eight) hours as needed for nausea or vomiting. 07/23/20  Yes Ladell Pier, MD  pantoprazole (PROTONIX) 40 MG tablet Take 1 tablet (40 mg total) by mouth 2 (two) times daily. 09/01/21  Yes Patrecia Pour, MD  pioglitazone (ACTOS) 45 MG tablet Take 45 mg by  mouth daily. 12/28/13  Yes [provider]  prochlorperazine (COMPAZINE) 10 MG tablet Take 1 tablet (10 mg total) by mouth every 6 (six) hours as needed for nausea. 07/23/20  Yes Ladell Pier, MD  propranolol (INDERAL) 10 MG tablet Take 1 tablet (10 mg total) by mouth 2 (two) times daily. Patient taking differently: Take 10 mg by mouth 2 (two) times daily as needed (only if  Systolic number is 376 or greater). 09/01/21  Yes Patrecia Pour, MD  Accu-Chek FastClix Lancets MISC Apply topically. 08/30/20   [provider]  ACCU-CHEK GUIDE test strip  08/31/20   [provider]  Blood Glucose Monitoring Suppl (ACCU-CHEK GUIDE) w/Device KIT See admin instructions. 08/03/20   [provider]  tadalafil (CIALIS) 20 MG tablet Take 20 mg by mouth daily as needed for erectile dysfunction. 06/11/21   [provider]    Scheduled Meds:  insulin aspart  0-9 Units Subcutaneous TID AC & HS   latanoprost  1 drop Both Eyes QHS   lipase/protease/amylase  72,000 Units Oral TID WC   magnesium oxide  400 mg Oral BID   [START ON 09/19/2021] pantoprazole  40 mg Intravenous Q12H   pioglitazone  45 mg Oral Daily   potassium chloride SA  20 mEq Oral Daily   Continuous Infusions:  sodium chloride 100 mL/hr at 09/16/21 0744   octreotide  (SANDOSTATIN)    IV infusion 50 mcg/hr (09/16/21 0743)   pantoprazole 8 mg/hr (09/16/21 0744)   PRN Meds:.acetaminophen **OR** acetaminophen, diphenhydrAMINE, diphenoxylate-atropine, famotidine, HYDROcodone-acetaminophen, lidocaine-prilocaine, loperamide, loratadine, magnesium hydroxide, ondansetron **OR** ondansetron (ZOFRAN) IV, prochlorperazine, propranolol, traZODone  Allergies as of 09/15/2021   (No Known Allergies)    Family History  Problem Relation Age of Onset   Diabetes Mother    Pancreatic cancer Father        d. 56   Breast cancer Sister 19   Diabetes Sister    Diabetes Brother    Stomach cancer Paternal Aunt        2 pat aunts with stomach cancer   Colon cancer Paternal Uncle    Cancer Paternal Grandfather        NOS   Diabetes Sister    Diabetes Brother    Brain cancer Paternal Aunt    Cancer Paternal Aunt        eye   Diabetes Son     Social History   Socioeconomic History   Marital status: Married    Spouse name: Not on file   Number of children: Not on file   Years of education:  Not on file   Highest education level: Not on file  Occupational History   Not on file  Tobacco Use   Smoking status: Former    Types: Cigarettes    Quit date: 08/17/1980    Years since quitting: 41.1   Smokeless tobacco: Never  Vaping Use   Vaping Use: Never used  Substance and Sexual Activity   Alcohol use: Not Currently   Drug use: Never   Sexual activity: Not on file  Other Topics Concern   Not on file  Social History Narrative   Not on file   Social Determinants of Health   Financial Resource Strain: Not on file  Food Insecurity: Not on file  Transportation Needs: Not on file  Physical Activity: Not on file  Stress: Not on file  Social Connections: Not on file  Intimate Partner Violence: Not on file    Review of  Systems:  Review of Systems  Constitutional:  Positive for malaise/fatigue. Negative for chills and fever.  Respiratory:  Negative for shortness of breath.   Cardiovascular:  Negative for chest pain and leg swelling.  Gastrointestinal:  Positive for blood in stool, diarrhea, melena, nausea and vomiting. Negative for abdominal pain, constipation and heartburn.  Musculoskeletal:  Negative for falls.  Skin:  Negative for rash.  Neurological:  Negative for loss of consciousness.  Psychiatric/Behavioral:  Negative for memory loss.     Physical Exam: Vital signs: Vitals:   09/16/21 0600 09/16/21 0634  BP: (!) 91/50 (!) 90/53  Pulse: 74 76  Resp: 20 18  Temp: 98.4 F (36.9 C) 98.2 F (36.8 C)  SpO2: 100%      General:   Alert, skinny appearing male in NAD Heart:  Regular rate and rhythm; no murmurs Pulm: Clear anteriorly; no wheezing Abdomen:  Soft, Flat AB, skin exam normal, Normal bowel sounds. mild tenderness in the epigastrium. Without guarding and Without rebound, hepatomegaly noted. Extremities:  Without edema. Neurologic:  Alert and  oriented x4;  grossly normal neurologically. Psych:  Alert and cooperative. Normal mood and affect.  GI:   Lab Results: Recent Labs    09/15/21 1036 09/15/21 1335 09/16/21 0435  WBC 10.4 13.1* 5.7  HGB 6.6* 7.2* 5.8*  HCT 20.6* 22.9* 18.3*  PLT 47* 53* 39*   BMET Recent Labs    09/15/21 1341 09/16/21 0435  NA 133* 134*  K 4.2 3.8  CL 109 111  CO2 19* 17*  GLUCOSE 246* 114*  BUN 31* 25*  CREATININE 1.22 1.08  CALCIUM 8.0* 7.4*   LFT Recent Labs    09/15/21 1341  PROT 5.8*  ALBUMIN 3.2*  AST 35  ALT 61*  ALKPHOS 286*  BILITOT 1.1   PT/INR Recent Labs    09/15/21 1341  LABPROT 15.3*  INR 1.2    Studies/Results: DG Chest Port 1 View  Result Date: 09/15/2021 CLINICAL DATA:  Evaluate for pulmonary edema. EXAM: PORTABLE CHEST 1 VIEW COMPARISON:  Chest CT dated 08/26/2021. FINDINGS: Left-sided Port-A-Cath with tip at the cavoatrial junction. No focal consolidation, pleural effusion, or pneumothorax. Faint nodular densities likely corresponding to the density seen on the CT. The cardiac silhouette is within normal limits. No acute osseous pathology. IMPRESSION: No acute cardiopulmonary process. Electronically Signed   By: Anner Crete M.D.   On: 09/15/2021 19:13    Impression Malignant pancreatic adenocarcinoma  Currently on chemotherapy following with Dr. Ammie Dalton - Cycle 11 gemcitabine/Abraxane 08/06/2021 -Cycle one 5-FU/liposomal irinotecan 09/08/2021   Metastatic disease to the liver  -with chronic venous occlusion, portal hypertension and gastric varices AST 35 ALT 61 Alkphos 286 TBili 1.1 GFR >60  INR 1.2- Normal this admission   Symptomatic anemia Baseline appears to be around 8.5-9, down to 7.2 then 5.8 during this admission WBC 5.7 HGB 5.8 MCV 93.4 Platelets 39 Iron 36 Ferritin 321 B12 1,947 10/25 BUN 23 Cr 1.16, 11/01 BUN 31, Ct 1.22 Patient did have slight increase in BUN this admission indicating potential upper GI bleed.  Thrombocytopenia in setting of chemotherapy Platelets of 39 can be contributing to this bleeding   Rectal bleeding   Previous colonoscopy 08/29/2021 on last admission for anemia and rectal bleeding showed acute colitis however patient's not on anti-inflammatories Varices due to portal congestion, grade 1 on recent EGD.  Possible chemotherapy colitis, may need to rule out infection.   Plan  Monitor platelets- transfuse 2 units- recheck tomorrow AM  -need  above 50,000 to proceed with EGD.   Continue Protonix and octreotide drip  Plan for EGD relook tomorrow with history of varices pending platelet count.   I thoroughly discussed the procedure to include nature, alternatives, benefits, and risks including but not limited to bleeding, perforation, infection, anesthesia/cardiac and pulmonary complications. Patient provides understanding and gave verbal consent to proceed.  Possible chemotherapy induced colitis.  No evidence of ischemic colitis, no AB pain.    Clear liquid diet, NPO at midnight.   Continue daily CBC with transfusion as needed to maintain Hgb >7.    If destabilizing bleeding occurs, recommend CTA followed by IR consultation if positive. Had negative study last visit.   [facility] will follow.    LOS: 1 day   Vladimir Crofts  PA-C 09/16/2021, 8:21 AM  Contact #  [phone]

## 2021-09-16 NOTE — Progress Notes (Signed)
Pt used bedside commode. Maroon stool noted in bucket.

## 2021-09-16 NOTE — Progress Notes (Signed)
PROGRESS NOTE    Aaron Johnston  HCW:[id number] DOB: 10/23/1946 DOA: 09/15/2021 PCP: [name], [name], MD    Chief Complaint  Patient presents with   Abnormal Lab   Rectal Bleeding    Brief Narrative:   Aaron Johnston is a 75 y.o. African-American male with medical history significant for metastatic pancreatic cancer, thrombocytopenia, GERD, chronic pain and type 2 diabetes mellitus, who presented to the emergency room with acute onset of abnormal labs with symptomatic anemia. The patient has been having maroon-colored stools and admitted to blood-tinged vomitus over the weekend.  He continues to have generalized weakness and admitted to intermittent episodes of bloody stools with bright red bleeding per rectum as well as occasional hematemesis.  Assessment & Plan:   Active Problems:   GI bleeding   GI bleeding/thrombocytopenia Symptomatic acute blood loss anemia S/p 2 units of PRBC and 2 units of platelets ordered. IVP PPI. GI on board and plan for EGD tomorrow. Clear liquid diet for now and n.p.o. 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   Type 2 diabetes mellitus Continue with sliding scale insulin, non-insulin-dependent.    History of esophageal varices On IV octreotide.   Thrombocytopenia Unclear etiology 2 units of platelets ordered by GI.    GERD On IV PPI therapy.     DVT prophylaxis: scd's Code Status: full code.  Family Communication: none at bedside.  Disposition:   Status is: Inpatient  Remains inpatient appropriate because: blood transfusion, further work up by GI.        Consultants:   Gastroenterology.   Procedures: EGD scheduled tomorrow.   Antimicrobials: none.    Subjective: No new complaints this afternoon.   Objective: Vitals:   09/16/21 0634 09/16/21 0909 09/16/21 0909  09/16/21 0938  BP: (!) 90/53 (!) 95/54 (!) 95/54 (!) 100/57  Pulse: 76 69 70 72  Resp: 18 16 18 18   Temp: 98.2 F (36.8 C)  97.6 F (36.4 C) 98.1 F (36.7 C)  TempSrc: Oral  Oral Oral  SpO2:  100% 100% 100%  Weight:      Height:        Intake/Output Summary (Last 24 hours) at 09/16/2021 1257 Last data filed at 09/16/2021 0909 Gross per 24 hour  Intake 315 ml  Output --  Net 315 ml   Filed Weights   09/15/21 1935  Weight: 61 kg    Examination:  General exam: Appears calm and comfortable  Respiratory system: Clear to auscultation. Respiratory effort normal. Cardiovascular system: S1 & S2 heard, RRR. No JVD, . No pedal edema. Gastrointestinal system: Abdomen is nondistended, soft and nontender.  Normal bowel sounds heard. Central nervous system: Alert and oriented. No focal neurological deficits. Extremities: Symmetric 5 x 5 power. Skin: No rashes, lesions or ulcers Psychiatry: Mood & affect appropriate.     Data Reviewed: I have personally reviewed following labs and imaging studies  CBC: Recent Labs  Lab 09/10/21 1116 09/15/21 1036 09/15/21 1335 09/16/21 0435  WBC 4.6 10.4 13.1* 5.7  NEUTROABS 3.7 8.8*  --   --   HGB 8.1* 6.6* 7.2* 5.8*  HCT 25.9* 20.6* 22.9* 18.3*  MCV 100.4* 95.4 99.6 93.4  PLT 45* 47* 53* 39*    Basic Metabolic Panel: Recent Labs  Lab 09/15/21 1341 09/16/21 0435  NA 133* 134*  K 4.2 3.8  CL 109 111  CO2 19* 17*  GLUCOSE 246* 114*  BUN 31* 25*  CREATININE 1.22 1.08  CALCIUM 8.0* 7.4*    GFR: Estimated Creatinine Clearance: 52.6 mL/min (by C-G formula based on SCr of 1.08 mg/dL).  Liver Function Tests: Recent Labs  Lab 09/15/21 1341  AST 35  ALT 61*  ALKPHOS 286*  BILITOT 1.1  PROT 5.8*  ALBUMIN 3.2*    CBG: Recent Labs  Lab 09/15/21 2214 09/16/21 0744 09/16/21 1241  GLUCAP 118* 116* 124*     Recent Results (from the past 240 hour(s))  Resp Panel by RT-PCR (Flu A&B, Covid) Nasopharyngeal Swab     Status:  None   Collection Time: 09/15/21  8:03 PM   Specimen: Nasopharyngeal Swab; Nasopharyngeal(NP) swabs in vial transport medium  Result Value Ref Range Status   SARS Coronavirus 2 by RT PCR NEGATIVE NEGATIVE Final    Comment: (NOTE) SARS-CoV-2 target nucleic acids are NOT DETECTED.  The SARS-CoV-2 RNA is generally detectable in upper respiratory specimens during the acute phase of infection. The lowest concentration of SARS-CoV-2 viral copies this assay can detect is 138 copies/mL. A negative result does not preclude SARS-Cov-2 infection and should not be used as the sole basis for treatment or other patient management decisions. A negative result may occur with  improper specimen collection/handling, submission of specimen other than nasopharyngeal swab, presence of viral mutation(s) within the areas targeted by this assay, and inadequate number of viral copies(<138 copies/mL). A negative result must be combined with clinical observations, patient history, and epidemiological information. The expected result is Negative.  Fact Sheet for Patients:  EntrepreneurPulse.com.au  Fact Sheet for Healthcare Providers:  IncredibleEmployment.be  This test is no t yet approved or cleared by the [place] FDA and  has been authorized for detection and/or diagnosis of SARS-CoV-2 by FDA under an Emergency Use Authorization (EUA). This EUA will remain  in effect (meaning this test can be used) for the duration of the COVID-19 declaration under Section 564(b)(1) of the Act, 21 U.S.C.section 360bbb-3(b)(1), unless the authorization is terminated  or revoked sooner.       Influenza A by PCR NEGATIVE NEGATIVE Final   Influenza B by PCR NEGATIVE NEGATIVE Final    Comment: (NOTE) The Xpert Xpress SARS-CoV-2/FLU/RSV plus assay is intended as an aid in the diagnosis of influenza from Nasopharyngeal swab specimens and should not be used as a sole basis for  treatment. Nasal washings and aspirates are unacceptable for Xpert Xpress SARS-CoV-2/FLU/RSV testing.  Fact Sheet for Patients: EntrepreneurPulse.com.au  Fact Sheet for Healthcare Providers: IncredibleEmployment.be  This test is not yet approved or cleared by the [place] FDA and has been authorized for detection and/or diagnosis of SARS-CoV-2 by FDA under an Emergency Use Authorization (EUA). This EUA will remain in effect (meaning this test can be used) for the duration of the COVID-19 declaration under Section 564(b)(1) of the Act, 21 U.S.C. section 360bbb-3(b)(1), unless the authorization is terminated or revoked.  Performed at [facility], [place] [address] [place], [place] [address]          Radiology Studies: [facility] Chest Port 1 View  Result Date: 09/15/2021 CLINICAL DATA:  Evaluate for pulmonary edema. EXAM: PORTABLE CHEST 1 VIEW COMPARISON:  Chest CT dated 08/26/2021. FINDINGS: Left-sided Port-A-Cath with tip at the cavoatrial junction. No focal consolidation, pleural effusion, or pneumothorax. Faint nodular densities likely corresponding to the density seen on the CT. The cardiac silhouette is within normal limits. No acute osseous pathology. IMPRESSION: No acute cardiopulmonary process. Electronically Signed   By:  Anner Crete M.D.   On: 09/15/2021 19:13        Scheduled Meds:  sodium chloride   Intravenous Once   Chlorhexidine Gluconate Cloth  6 each Topical Daily   insulin aspart  0-9 Units Subcutaneous TID AC & HS   latanoprost  1 drop Both Eyes QHS   lipase/protease/amylase  72,000 Units Oral TID WC   magnesium oxide  400 mg Oral BID   [START ON 09/19/2021] pantoprazole  40 mg Intravenous Q12H   potassium chloride SA  20 mEq Oral Daily   Continuous Infusions:  sodium chloride 100 mL/hr at 09/16/21 0744   octreotide  (SANDOSTATIN)    IV infusion 50 mcg/hr (09/16/21 0743)   pantoprazole 8 mg/hr  (09/16/21 0744)     LOS: 1 day        Hosie Poisson, MD [facility]   To contact the attending provider between 7A-7P or the covering provider during after hours 7P-7A, please log into the web site [email or web address] and access using universal [place] password for that web site. If you do not have the password, please call the hospital operator.  09/16/2021, 12:57 PM

## 2021-09-17 ENCOUNTER — Inpatient Hospital Stay (HOSPITAL_COMMUNITY): Payer: Medicare Other | Admitting: Certified Registered Nurse Anesthetist

## 2021-09-17 ENCOUNTER — Encounter (HOSPITAL_COMMUNITY)
Admission: EM | Disposition: A | Discharge status: Home / Self Care | Payer: Self-pay | Source: Home / Self Care | Attending: Internal Medicine

## 2021-09-17 ENCOUNTER — Encounter (HOSPITAL_COMMUNITY): Payer: Self-pay | Admitting: Family Medicine

## 2021-09-17 DIAGNOSIS — Z7189 Other specified counseling: Secondary | ICD-10-CM

## 2021-09-17 DIAGNOSIS — Z515 Encounter for palliative care: Secondary | ICD-10-CM

## 2021-09-17 HISTORY — PX: ESOPHAGOGASTRODUODENOSCOPY (EGD) WITH PROPOFOL: SHX5813

## 2021-09-17 HISTORY — PX: ESOPHAGEAL BANDING: SHX5518

## 2021-09-17 LAB — CBC WITH DIFFERENTIAL/PLATELET
Abs Immature Granulocytes: 0.1 10*3/uL — ABNORMAL HIGH (ref 0.00–0.07)
Basophils Absolute: 0.1 10*3/uL (ref 0.0–0.1)
Basophils Relative: 1 %
Eosinophils Absolute: 0.3 10*3/uL (ref 0.0–0.5)
Eosinophils Relative: 5 %
HCT: 21.4 % — ABNORMAL LOW (ref 39.0–52.0)
Hemoglobin: 7 g/dL — ABNORMAL LOW (ref 13.0–17.0)
Immature Granulocytes: 2 %
Lymphocytes Relative: 11 %
Lymphs Abs: 0.7 10*3/uL (ref 0.7–4.0)
MCH: 30 pg (ref 26.0–34.0)
MCHC: 32.7 g/dL (ref 30.0–36.0)
MCV: 91.8 fL (ref 80.0–100.0)
Monocytes Absolute: 0.2 10*3/uL (ref 0.1–1.0)
Monocytes Relative: 4 %
Neutro Abs: 4.4 10*3/uL (ref 1.7–7.7)
Neutrophils Relative %: 77 %
Platelets: 64 10*3/uL — ABNORMAL LOW (ref 150–400)
RBC: 2.33 MIL/uL — ABNORMAL LOW (ref 4.22–5.81)
RDW: 19.2 % — ABNORMAL HIGH (ref 11.5–15.5)
WBC: 5.7 10*3/uL (ref 4.0–10.5)
nRBC: 0 % (ref 0.0–0.2)

## 2021-09-17 LAB — BASIC METABOLIC PANEL
Anion gap: 6 (ref 5–15)
BUN: 26 mg/dL — ABNORMAL HIGH (ref 8–23)
CO2: 19 mmol/L — ABNORMAL LOW (ref 22–32)
Calcium: 7.6 mg/dL — ABNORMAL LOW (ref 8.9–10.3)
Chloride: 113 mmol/L — ABNORMAL HIGH (ref 98–111)
Creatinine, Ser: 1.2 mg/dL (ref 0.61–1.24)
GFR, Estimated: >60 mL/min (ref >60)
Glucose, Bld: 98 mg/dL (ref 70–99)
Potassium: 3.4 mmol/L — ABNORMAL LOW (ref 3.5–5.1)
Sodium: 138 mmol/L (ref 135–145)

## 2021-09-17 LAB — PREPARE RBC (CROSSMATCH)

## 2021-09-17 LAB — GLUCOSE, CAPILLARY
Glucose-Capillary: 93 mg/dL (ref 70–99)
Glucose-Capillary: 86 mg/dL (ref 70–99)
Glucose-Capillary: 119 mg/dL — ABNORMAL HIGH (ref 70–99)
Glucose-Capillary: 143 mg/dL — ABNORMAL HIGH (ref 70–99)

## 2021-09-17 LAB — PREPARE PLATELET PHERESIS
Unit division: 0
Unit division: 0

## 2021-09-17 LAB — BPAM PLATELET PHERESIS
Blood Product Expiration Date: 202211042359
Blood Product Expiration Date: 202211052359
ISSUE DATE / TIME: 202211021656
ISSUE DATE / TIME: 202211022321
Unit Type and Rh: 7300
Unit Type and Rh: 7300

## 2021-09-17 LAB — PROTIME-INR
INR: 1.3 — ABNORMAL HIGH (ref 0.8–1.2)
Prothrombin Time: 16.1 seconds — ABNORMAL HIGH (ref 11.4–15.2)

## 2021-09-17 SURGERY — ESOPHAGOGASTRODUODENOSCOPY (EGD) WITH PROPOFOL
Anesthesia: Monitor Anesthesia Care

## 2021-09-17 MED ORDER — SODIUM CHLORIDE 0.9 % IV SOLN: INTRAVENOUS | Status: DC

## 2021-09-17 MED ORDER — PHENOL 1.4 % MT LIQD
1.0000 | OROMUCOSAL | Status: DC | PRN
  Filled 2021-09-17: qty 177

## 2021-09-17 MED ORDER — LIDOCAINE 2% (20 MG/ML) 5 ML SYRINGE
INTRAMUSCULAR | Status: DC | PRN
  Administered 2021-09-17: 50 mg via INTRAVENOUS

## 2021-09-17 MED ORDER — PROPOFOL 10 MG/ML IV BOLUS
INTRAVENOUS | Status: DC | PRN
  Administered 2021-09-17 (×2): 30 mg via INTRAVENOUS
  Administered 2021-09-17: 50 mg via INTRAVENOUS
  Administered 2021-09-17 (×3): 30 mg via INTRAVENOUS

## 2021-09-17 MED ORDER — LACTATED RINGERS IV SOLN: INTRAVENOUS | Status: DC | PRN

## 2021-09-17 MED ORDER — SODIUM CHLORIDE 0.9% IV SOLUTION: Freq: Once | INTRAVENOUS | Status: DC

## 2021-09-17 MED ORDER — SODIUM CHLORIDE 0.9 % IV SOLN
2.0000 g | INTRAVENOUS | Status: DC
  Administered 2021-09-17: 2 g via INTRAVENOUS
  Filled 2021-09-17 (×2): qty 20

## 2021-09-17 SURGICAL SUPPLY — 15 items

## 2021-09-17 NOTE — Op Note (Signed)
[facility] Patient Name: Aaron Johnston Procedure Date: 09/17/2021 MRN: [id number] Attending MD: Otis Brace , MD Date of Birth: 05-21-1946 CSN: [id number] Age: 75 Admit Type: Outpatient Procedure:                Upper GI endoscopy Indications:              Hematemesis, Follow-up of esophageal varices Providers:                Otis Brace, MD, Joya Gaskins, Luan Moore, Technician, Ernesto Rutherford Referring MD:              Medicines:                Sedation Administered by an Anesthesia Professional Complications:            No immediate complications. Estimated Blood Loss:     Estimated blood loss was minimal. Procedure:                Pre-Anesthesia Assessment:                           - Prior to the procedure, a History and Physical                            was performed, and patient medications and                            allergies were reviewed. The patient's tolerance of                            previous anesthesia was also reviewed. The risks                            and benefits of the procedure and the sedation                            options and risks were discussed with the patient.                            All questions were answered, and informed consent                            was obtained. Prior Anticoagulants: The patient has                            taken no previous anticoagulant or antiplatelet                            agents. ASA Grade Assessment: III - A patient with                            severe systemic disease. After reviewing the risks  and benefits, the patient was deemed in                            satisfactory condition to undergo the procedure.                           After obtaining informed consent, the endoscope was                            passed under direct vision. Throughout the                            procedure, the patient's  blood pressure, pulse, and                            oxygen saturations were monitored continuously. The                            EGD-OR was introduced through the mouth, and                            advanced to the second part of duodenum. The upper                            GI endoscopy was accomplished without difficulty.                            The patient tolerated the procedure well. Scope In: Scope Out: Findings:      Three columns of large (> 5 mm) varices with stigmata of recent bleeding       were found in the mid esophagus and in the distal esophagus,. Red wale       signs were present. Three bands were successfully placed with incomplete       eradication of varices. There was no bleeding during the maneuver.      Type 1 isolated gastric varices (IGV1, varices located in the fundus)       with no bleeding were found in the gastric fundus. There were no       stigmata of recent bleeding.      Severe portal hypertensive gastropathy was found in the gastric fundus       and in the gastric body.      The duodenal bulb, first portion of the duodenum and second portion of       the duodenum were normal. Impression:               - Large (> 5 mm) esophageal varices with stigmata                            of recent bleeding. Incompletely eradicated. Banded.                           - Type 1 isolated gastric varices (IGV1, varices                            located  in the fundus), without bleeding.                           - Portal hypertensive gastropathy.                           - Normal duodenal bulb, first portion of the                            duodenum and second portion of the duodenum.                           - No specimens collected. Moderate Sedation:      Moderate (conscious) sedation was personally administered by an       anesthesia professional. The following parameters were monitored: oxygen       saturation, heart rate, blood pressure, and response  to care. Recommendation:           - Return patient to hospital ward for ongoing care.                           - Full liquid diet.                           - Continue present medications.                           - Repeat upper endoscopy in 4 weeks for retreatment. Procedure Code(s):        --- Professional ---                           [phone], Esophagogastroduodenoscopy, flexible,                            transoral; with band ligation of esophageal/gastric                            varices Diagnosis Code(s):        --- Professional ---                           I85.01, Esophageal varices with bleeding                           I86.4, Gastric varices                           K76.6, Portal hypertension                           K31.89, Other diseases of stomach and duodenum                           K92.0, Hematemesis CPT copyright 2019 [employer]. All rights reserved. The codes documented in this report are preliminary and upon coder review may  be revised to meet current compliance requirements. Otis Brace, MD Otis Brace, MD 09/17/2021 2:12:11 PM Number of Addenda: 0

## 2021-09-17 NOTE — Brief Op Note (Signed)
09/15/2021 - 09/17/2021  2:12 PM  PATIENT:  Aaron Johnston  75 y.o. male  PRE-OPERATIVE DIAGNOSIS:  Varices, hematemesis, anemia  POST-OPERATIVE DIAGNOSIS:  gastric varices, esophageal varices w/ banding  PROCEDURE:  Procedure(s): ESOPHAGOGASTRODUODENOSCOPY (EGD) WITH PROPOFOL (N/A)  SURGEON:  Surgeon(s) and Role:    * Bassy Fetterly, MD - Primary  Findings ---------- -EGD showed large esophageal varices with red wale signs.  3 bands placed.  Also showed type I isolated gastric varices and severe portal hypertensive gastropathy.  Recommendations ----------------------- -Continue octreotide for now. - add Rocephin -Monitor CBC -Okay to have full liquid diet today -Patient is already on propranolol -GI will follow  Otis Brace MD, Brenda 09/17/2021, 2:13 PM  Contact #  [phone]

## 2021-09-17 NOTE — Transfer of Care (Signed)
Immediate Anesthesia Transfer of Care Note  Patient: Aaron Johnston  Procedure(s) Performed: ESOPHAGOGASTRODUODENOSCOPY (EGD) WITH PROPOFOL  Patient Location: PACU and Endoscopy Unit  Anesthesia Type:MAC  Level of Consciousness: drowsy  Airway & Oxygen Therapy: Patient Spontanous Breathing and Patient connected to face mask  Post-op Assessment: Report given to RN and Post -op Vital signs reviewed and stable  Post vital signs: Reviewed and stable  Last Vitals:  Vitals Value Taken Time  BP 128/71 09/17/21 1406  Temp    Pulse 85 09/17/21 1407  Resp 21 09/17/21 1407  SpO2 98 % 09/17/21 1407  Vitals shown include unvalidated device data.  Last Pain:  Vitals:   09/17/21 1321  TempSrc: Oral  PainSc: 0-No pain         Complications: No notable events documented.

## 2021-09-17 NOTE — Progress Notes (Addendum)
IP PROGRESS NOTE  Subjective:   Mr. Baehr denies recurrent bleeding.  Scheduled for EGD later today.  Objective: Vital signs in last 24 hours: Blood pressure 106/61, pulse 78, temperature 98.2 F (36.8 C), temperature source Oral, resp. rate 16, height '5\' 8"'  (1.727 m), weight 61 kg, SpO2 100 %.  Intake/Output from previous day: 11/02 0701 - 11/03 0700 In: 5047 [P.O.:240; I.V.:3663.4; Blood:1143.6] Out: -   Physical Exam:  HEENT: No thrush or ulcers Lungs: Clear bilaterally Cardiac: Regular rate and rhythm Abdomen: No mass, no hepatosplenomegaly, nontender Extremities: No leg edema   Portacath/PICC-without erythema  Lab Results: Recent Labs    09/16/21 1455 09/17/21 0456  WBC 5.8 5.7  HGB 6.7* 7.0*  HCT 21.1* 21.4*  PLT 40* 64*    BMET Recent Labs    09/16/21 0435 09/17/21 0456  NA 134* 138  K 3.8 3.4*  CL 111 113*  CO2 17* 19*  GLUCOSE 114* 98  BUN 25* 26*  CREATININE 1.08 1.20  CALCIUM 7.4* 7.6*    Lab Results  Component Value Date   CAN199 3,411 (H) 08/20/2021    Studies/Results: DG Chest Port 1 View  Result Date: 09/15/2021 CLINICAL DATA:  Evaluate for pulmonary edema. EXAM: PORTABLE CHEST 1 VIEW COMPARISON:  Chest CT dated 08/26/2021. FINDINGS: Left-sided Port-A-Cath with tip at the cavoatrial junction. No focal consolidation, pleural effusion, or pneumothorax. Faint nodular densities likely corresponding to the density seen on the CT. The cardiac silhouette is within normal limits. No acute osseous pathology. IMPRESSION: No acute cardiopulmonary process. Electronically Signed   By: Anner Crete M.D.   On: 09/15/2021 19:13    Medications: I have reviewed the patient's current medications.  Assessment/Plan:  Pancreas cancer CT urology center 03/04/2020-haziness of the fat adjacent to the celiac axis and SMA with focal narrowing of the SMV, splenic vein, and splenoportal confluence CT 07/07/2020-infiltrative retroperitoneal mass with  encasement of the celiac axis and SMA, high-grade narrowing of the proximal portal vein with cavernous transformation, nonocclusive thrombus within the main portal vein, mild biliary ductal dilatation, diffuse hypodense/hypoenhancing areas within the liver (edema versus infiltrating neoplasm), 2 x 1.7 cm area of focal prominence in the pancreas EUS 07/10/2020-no pancreas mass identified, 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, 1.5 cm aortocaval node biopsy-scant lymphoid material, no malignancy Diagnostic laparoscopy 07/17/2020-segment 3 and 4 liver nodules, adenocarcinoma, positive for pankeratin, CK7 and CK20, partially positive for TTF-1, negative for CDX2 Foundation 1-microsatellite stable, tumor mutation burden 4, K-ras G12D, subclonal RB1 alteration Elevated CA 19-9 Cycle 1 FOLFOX 07/28/2020 Cycle 2 FOLFOX 08/18/2020, oxaliplatin dose reduced due to thrombocytopenia Cycle 3 FOLFOX 09/08/2020 Cycle 4 FOLFOX 09/29/2020  Cycle 5 FOLFOX 10/20/2020 CTs 11/06/2020-grossly stable infiltrative mass within the pancreatic head and porta hepatis.  New small low-density liver lesions.  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 Cycle 6 FOLFOX 11/10/2021  Cycle 7 FOLFOX 12/01/2020 MRI liver 12/04/2020-no significant change in posttreatment appearance of the pancreatic head.  Numerous intrinsically low signal hypoenhancing lesions of the liver parenchyma predominantly observed in the right lobe of the liver and liver dome, measuring no greater than 8 mm and as seen on recent prior CT of the abdomen/pelvis. Cycle 8 FOLFOX 12/22/2020 Cycle 9 FOLFOX 01/12/2021 Cycle 10 FOLFOX 02/02/2021 MRI liver 02/19/2021-no change in pancreas head mass, multiple hypoenhancing liver lesions are new and increased in size, effacement of portal vein by  pancreas head mass with cavernous transformation, no  change in mild splenomegaly Cycle 1 gemcitabine/Abraxane 03/12/2021 Cycle 2 gemcitabine/Abraxane 03/27/2021- dose reductions secondary to neutropenia Cycle 3 gemcitabine/Abraxane 04/17/2021-dose reductions due to thrombocytopenia, white cell growth factor support added Cycle 4 gemcitabine/Abraxane 05/01/2021, Ziextenzo Cycle 5 gemcitabine/Abraxane 05/15/2021, Ziextenzo MRI abdomen 05/26/2021-mild enlargement of dominant hepatic metastases, appearance of infiltrative pancreas mass extending to the celiac trunk and SMA, splenic vein occlusion, cavernous transformation of the portal vein with upper abdomen collaterals, splenomegaly, mild upper abdominal ascites and mesenteric edema, minimal subpleural nodularity left lower lobe Cycle 6 gemcitabine/Abraxane 05/28/2021 Cycle 7 gemcitabine/Abraxane 06/12/2021 Cycle 8 gemcitabine/Abraxane 06/26/2021 Cycle 9 gemcitabine/Abraxane 07/10/2021 Cycle 10 gemcitabine/Abraxane 07/23/2021 Cycle 11 gemcitabine/Abraxane 08/06/2021 MRI abdomen 08/20/2021-multifocal nodularity in the lower chest with a new discrete right lower lobe nodule, enlarging subsegment 7/8 liver lesion, other small lesions appear stable, gastric varices, review of images with radiology-enlargement of at least 2 liver lesions, suspicious nodule n the right lower lung Cycle one 5-FU/liposomal irinotecan 09/08/2021   Chronic thrombocytopenia- weekly Nplate beginning 1/91/6606 Diabetes Hypertension Abdomen/back pain 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   Admission 08/27/2021 with GI bleeding Upper endoscopy 08/27/2021-grade 1 esophageal varices, gastric varices without bleeding CT angiogram abdomen/pelvis 08/27/2021-extensive paraesophageal and gastric varices with chronic portal venous occlusion Colonoscopy 08/29/2021- inflamed, friable colonic mucosa  with contact bleeding 9.   Admission with recurrent GI bleeding and severe anemia 09/15/2021  Mr. Quest appears stable.  He is now day 10 following cycle 1 of salvage treatment with FOLFIRI.  He has received a PRBC transfusion for GI bleeding and severe anemia.  Suspect that the bleeding is related to portal hypertension/varices.  He also has moderate thrombocytopenia also likely related to his portal hypertension and varices.  His moderate thrombocytopenia is unlikely to result in bleeding.  He received 2 units of platelets per GI in preparation for EGD today.  His hemoglobin is stable and platelets have improved.  Mr. Shiraishi has chronic thrombocytopenia, likely related to liver disease and portal hypertension.  The thrombocytopenia has responded to Nplate in the past.  He received Nplate 11/1 and will continue this weekly.  Recommendations: Transfusion as needed for severe anemia We will follow-up on results of EGD Follow platelet count, weekly Nplate.  No platelet transfusion indicated today. Oncology will continue following Mr. Dolley in the hospital.  Outpatient follow-up as scheduled with Cancer center.     LOS: 2 days   Mikey Bussing, NP   09/17/2021, 7:45 AM Mr. Blahut was interviewed and examined.  He appears stable.  The hemoglobin is stable today.  The platelets are higher after receiving platelet transfusions yesterday and Nplate on 00/02/5996.  There is no indication for further platelet transfusion at present.  He will be scheduled for outpatient follow-up to continue FOLFIRI chemotherapy.  I was present for greater than 50% of today's visit.  I performed medical decision making.

## 2021-09-17 NOTE — Anesthesia Preprocedure Evaluation (Addendum)
Anesthesia Evaluation  Patient identified by MRN, date of birth, ID band Patient awake    Reviewed: Allergy & Precautions, H&P , NPO status , Patient's Chart, lab work & pertinent test results  Airway Mallampati: II  TM Distance: >3 FB Neck ROM: Full    Dental no notable dental hx. (+) Teeth Intact, Dental Advisory Given   Pulmonary neg pulmonary ROS, former smoker,    Pulmonary exam normal breath sounds clear to auscultation       Cardiovascular negative cardio ROS   Rhythm:Regular Rate:Normal     Neuro/Psych negative neurological ROS  negative psych ROS   GI/Hepatic negative GI ROS, Neg liver ROS,   Endo/Other  diabetes, Type 2, Oral Hypoglycemic Agents  Renal/GU negative Renal ROS  negative genitourinary   Musculoskeletal   Abdominal   Peds  Hematology  (+) anemia ,   Anesthesia Other Findings   Reproductive/Obstetrics negative OB ROS                            Anesthesia Physical Anesthesia Plan  ASA: 3  Anesthesia Plan: MAC   Post-op Pain Management:    Induction: Intravenous  PONV Risk Score and Plan: 1 and Propofol infusion  Airway Management Planned: Simple Face Mask  Additional Equipment:   Intra-op Plan:   Post-operative Plan:   Informed Consent: I have reviewed the patients History and Physical, chart, labs and discussed the procedure including the risks, benefits and alternatives for the proposed anesthesia with the patient or authorized representative who has indicated his/her understanding and acceptance.     Dental advisory given  Plan Discussed with: CRNA  Anesthesia Plan Comments:         Anesthesia Quick Evaluation

## 2021-09-17 NOTE — Interval H&P Note (Signed)
History and Physical Interval Note:  09/17/2021 1:20 PM  Aaron Johnston  has presented today for surgery, with the diagnosis of Varices, hematemesis, anemia.  The various methods of treatment have been discussed with the patient and family. After consideration of risks, benefits and other options for treatment, the patient has consented to  Procedure(s): ESOPHAGOGASTRODUODENOSCOPY (EGD) WITH PROPOFOL (N/A) as a surgical intervention.  The patient's history has been reviewed, patient examined, no change in status, stable for surgery.  I have reviewed the patient's chart and labs.  Questions were answered to the patient's satisfaction.    No further bleeding episodes.  Labs reviewed.  Platelet counts of 64.  Hemoglobin 7.   Araceli Arango

## 2021-09-17 NOTE — Progress Notes (Signed)
PROGRESS NOTE    Aaron Johnston  FTD:[id number] DOB: 1946-07-25 DOA: 09/15/2021 PCP: [name], [name], MD    Chief Complaint  Patient presents with   Abnormal Lab   Rectal Bleeding    Brief Narrative:   Aaron Johnston is a 75 y.o. African-American male with medical history significant for metastatic pancreatic cancer, thrombocytopenia, GERD, chronic pain and type 2 diabetes mellitus, who presented to the emergency room with acute onset of abnormal labs with symptomatic anemia. The patient has been having maroon-colored stools and admitted to blood-tinged vomitus over the weekend.  He continues to have generalized weakness and admitted to intermittent episodes of bloody stools with bright red bleeding per rectum as well as occasional hematemesis.  Assessment & Plan:   Active Problems:   GI bleeding   GI bleeding/thrombocytopenia Symptomatic acute blood loss anemia S/p total of 3 units of prbc transfusion and 2 units of platelets ,  GI consulted. And he underwent EGD showing large esophageal varices.  Continue with octreotide and propranolol.  Advance diet to full liquid diet.    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   Type 2 diabetes mellitus Continue with sliding scale insulin, non-insulin-dependent. CBG (last 3)  Recent Labs    09/17/21 0739 09/17/21 1148 09/17/21 1659  GLUCAP 93 86 119*       History of esophageal varices On IV octreotide, in addition to propranolol.  Rocephin added to the regimen by GI .    Thrombocytopenia Unclear etiology 2 units of platelets ordered by GI. Platelets have improved to 64,000.    GERD On IV PPI therapy.  Hypokalemia:  Replaced.    Hyponatremia:  Normalized.      DVT prophylaxis: scd's Code Status: full code.  Family Communication: family at bedside.  Disposition:   Status is:  Inpatient  Remains inpatient appropriate because: blood transfusion, further work up by GI.        Consultants:   Gastroenterology.  Oncology Palliative care consult.   Procedures: EGD   Antimicrobials:  Antibiotics Given (last 72 hours)     None         Subjective: Feeling better. No new complaints.   Objective: Vitals:   09/17/21 1410 09/17/21 1420 09/17/21 1430 09/17/21 1432  BP: 122/74 (!) 114/54  (!) 130/59  Pulse: 80 75  72  Resp: 19 14 13 18   Temp:      TempSrc:      SpO2: 99% 99%  99%  Weight:      Height:        Intake/Output Summary (Last 24 hours) at 09/17/2021 1832 Last data filed at 09/17/2021 0800 Gross per 24 hour  Intake 4491.96 ml  Output 100 ml  Net 4391.96 ml    Filed Weights   09/15/21 1935  Weight: 61 kg    Examination:  General exam: Appears calm and comfortable  Respiratory system: Clear to auscultation. Respiratory effort normal. Cardiovascular system: S1 & S2 heard, RRR. No JVD, No pedal edema. Gastrointestinal system: Abdomen is nondistended, soft and nontender. . Normal bowel sounds heard. Central nervous system: Alert and oriented. No focal neurological deficits. Extremities: Symmetric 5 x 5 power. Skin: No rashes, lesions or ulcers Psychiatry: Mood & affect appropriate.      Data Reviewed: I have personally reviewed following labs and imaging studies  CBC: Recent Labs  Lab 09/15/21 1036 09/15/21 1335 09/16/21 0435 09/16/21 1455 09/17/21 0456  WBC 10.4 13.1* 5.7 5.8 5.7  NEUTROABS 8.8*  --   --  4.5 4.4  HGB 6.6* 7.2* 5.8* 6.7* 7.0*  HCT 20.6* 22.9* 18.3* 21.1* 21.4*  MCV 95.4 99.6 93.4 90.9 91.8  PLT 47* 53* 39* 40* 64*     Basic Metabolic Panel: Recent Labs  Lab 09/15/21 1341 09/16/21 0435 09/17/21 0456  NA 133* 134* 138  K 4.2 3.8 3.4*  CL 109 111 113*  CO2 19* 17* 19*  GLUCOSE 246* 114* 98  BUN 31* 25* 26*  CREATININE 1.22 1.08 1.20  CALCIUM 8.0* 7.4* 7.6*     GFR: Estimated  Creatinine Clearance: 47.3 mL/min (by C-G formula based on SCr of 1.2 mg/dL).  Liver Function Tests: Recent Labs  Lab 09/15/21 1341  AST 35  ALT 61*  ALKPHOS 286*  BILITOT 1.1  PROT 5.8*  ALBUMIN 3.2*     CBG: Recent Labs  Lab 09/16/21 1721 09/16/21 2117 09/17/21 0739 09/17/21 1148 09/17/21 1659  GLUCAP 87 105* 93 86 119*      Recent Results (from the past 240 hour(s))  Resp Panel by RT-PCR (Flu A&B, Covid) Nasopharyngeal Swab     Status: None   Collection Time: 09/15/21  8:03 PM   Specimen: Nasopharyngeal Swab; Nasopharyngeal(NP) swabs in vial transport medium  Result Value Ref Range Status   SARS Coronavirus 2 by RT PCR NEGATIVE NEGATIVE Final    Comment: (NOTE) SARS-CoV-2 target nucleic acids are NOT DETECTED.  The SARS-CoV-2 RNA is generally detectable in upper respiratory specimens during the acute phase of infection. The lowest concentration of SARS-CoV-2 viral copies this assay can detect is 138 copies/mL. A negative result does not preclude SARS-Cov-2 infection and should not be used as the sole basis for treatment or other patient management decisions. A negative result may occur with  improper specimen collection/handling, submission of specimen other than nasopharyngeal swab, presence of viral mutation(s) within the areas targeted by this assay, and inadequate number of viral copies(<138 copies/mL). A negative result must be combined with clinical observations, patient history, and epidemiological information. The expected result is Negative.  Fact Sheet for Patients:  EntrepreneurPulse.com.au  Fact Sheet for Healthcare Providers:  IncredibleEmployment.be  This test is no t yet approved or cleared by the [place] FDA and  has been authorized for detection and/or diagnosis of SARS-CoV-2 by FDA under an Emergency Use Authorization (EUA). This EUA will remain  in effect (meaning this test can be used) for the  duration of the COVID-19 declaration under Section 564(b)(1) of the Act, 21 U.S.C.section 360bbb-3(b)(1), unless the authorization is terminated  or revoked sooner.       Influenza A by PCR NEGATIVE NEGATIVE Final   Influenza B by PCR NEGATIVE NEGATIVE Final    Comment: (NOTE) The Xpert Xpress SARS-CoV-2/FLU/RSV plus assay is intended as an aid in the diagnosis of influenza from Nasopharyngeal swab specimens and should not be used as a sole basis for treatment. Nasal washings and aspirates are unacceptable for Xpert Xpress SARS-CoV-2/FLU/RSV testing.  Fact Sheet for Patients: EntrepreneurPulse.com.au  Fact Sheet for Healthcare Providers: IncredibleEmployment.be  This test is not yet approved or cleared by the [place] FDA and has been authorized for detection and/or diagnosis of SARS-CoV-2 by FDA under an Emergency Use Authorization (EUA). This EUA will remain in effect (meaning this test can be used) for the duration of the COVID-19 declaration under Section 564(b)(1) of the Act, 21 U.S.C. section 360bbb-3(b)(1), unless the authorization is terminated or revoked.  Performed at [facility], [place] [address] [place], [place] [address]           Radiology Studies: [facility] Chest Port 1 View  Result Date: 09/15/2021 CLINICAL DATA:  Evaluate for pulmonary edema. EXAM: PORTABLE CHEST 1 VIEW COMPARISON:  Chest CT dated 08/26/2021. FINDINGS: Left-sided Port-A-Cath with tip at the cavoatrial junction. No focal consolidation, pleural effusion, or pneumothorax. Faint nodular densities likely corresponding to the density seen on the CT. The cardiac silhouette is within normal limits. No acute osseous pathology. IMPRESSION: No acute cardiopulmonary process. Electronically Signed   By: Anner Crete M.D.   On: 09/15/2021 19:13        Scheduled Meds:  sodium chloride   Intravenous Once   Chlorhexidine Gluconate Cloth  6 each  Topical Daily   insulin aspart  0-9 Units Subcutaneous TID AC & HS   latanoprost  1 drop Both Eyes QHS   lipase/protease/amylase  72,000 Units Oral TID WC   magnesium oxide  400 mg Oral BID   [START ON 09/19/2021] pantoprazole  40 mg Intravenous Q12H   potassium chloride SA  20 mEq Oral Daily   Continuous Infusions:  sodium chloride 100 mL/hr at 09/17/21 1340   cefTRIAXone (ROCEPHIN)  IV     octreotide  (SANDOSTATIN)    IV infusion 50 mcg/hr (09/17/21 1340)   pantoprazole 8 mg/hr (09/17/21 1340)     LOS: 2 days        Hosie Poisson, MD [facility]   To contact the attending provider between 7A-7P or the covering provider during after hours 7P-7A, please log into the web site [email or web address] and access using universal [place] password for that web site. If you do not have the password, please call the hospital operator.  09/17/2021, 6:32 PM

## 2021-09-17 NOTE — Anesthesia Postprocedure Evaluation (Signed)
Anesthesia Post Note  Patient: Aaron Johnston  Procedure(s) Performed: ESOPHAGOGASTRODUODENOSCOPY (EGD) WITH PROPOFOL     Patient location during evaluation: Endoscopy Anesthesia Type: MAC Level of consciousness: awake and alert Pain management: pain level controlled Vital Signs Assessment: post-procedure vital signs reviewed and stable Respiratory status: spontaneous breathing, nonlabored ventilation and respiratory function stable Cardiovascular status: stable and blood pressure returned to baseline Postop Assessment: no apparent nausea or vomiting Anesthetic complications: no   No notable events documented.  Last Vitals:  Vitals:   09/17/21 1321 09/17/21 1406  BP: 132/64 128/71  Pulse: 71 84  Resp: 10 20  Temp: 36.7 C   SpO2: 100% 98%    Last Pain:  Vitals:   09/17/21 1321  TempSrc: Oral  PainSc: 0-No pain                 Gadge Hermiz,W. EDMOND

## 2021-09-18 ENCOUNTER — Encounter (HOSPITAL_COMMUNITY): Payer: Self-pay | Admitting: Gastroenterology

## 2021-09-18 LAB — CBC
HCT: 26.1 % — ABNORMAL LOW (ref 39.0–52.0)
Hemoglobin: 8.5 g/dL — ABNORMAL LOW (ref 13.0–17.0)
MCH: 30 pg (ref 26.0–34.0)
MCHC: 32.6 g/dL (ref 30.0–36.0)
MCV: 92.2 fL (ref 80.0–100.0)
Platelets: 69 10*3/uL — ABNORMAL LOW (ref 150–400)
RBC: 2.83 MIL/uL — ABNORMAL LOW (ref 4.22–5.81)
RDW: 17.7 % — ABNORMAL HIGH (ref 11.5–15.5)
WBC: 6 10*3/uL (ref 4.0–10.5)
nRBC: 0 % (ref 0.0–0.2)

## 2021-09-18 LAB — BASIC METABOLIC PANEL
Anion gap: 10 (ref 5–15)
BUN: 19 mg/dL (ref 8–23)
CO2: 17 mmol/L — ABNORMAL LOW (ref 22–32)
Calcium: 7.8 mg/dL — ABNORMAL LOW (ref 8.9–10.3)
Chloride: 113 mmol/L — ABNORMAL HIGH (ref 98–111)
Creatinine, Ser: 1.19 mg/dL (ref 0.61–1.24)
GFR, Estimated: >60 mL/min (ref >60)
Glucose, Bld: 108 mg/dL — ABNORMAL HIGH (ref 70–99)
Potassium: 3.6 mmol/L (ref 3.5–5.1)
Sodium: 140 mmol/L (ref 135–145)

## 2021-09-18 LAB — GLUCOSE, CAPILLARY
Glucose-Capillary: 99 mg/dL (ref 70–99)
Glucose-Capillary: 144 mg/dL — ABNORMAL HIGH (ref 70–99)
Glucose-Capillary: 127 mg/dL — ABNORMAL HIGH (ref 70–99)

## 2021-09-18 MED ORDER — PROPRANOLOL HCL 10 MG PO TABS: 10.0000 mg | ORAL_TABLET | Freq: Two times a day (BID) | ORAL | 0 refills | Status: DC

## 2021-09-18 MED ORDER — ADULT MULTIVITAMIN W/MINERALS CH: 1.0000 | ORAL_TABLET | Freq: Every day | ORAL | Status: DC

## 2021-09-18 MED ORDER — BOOST PLUS PO LIQD
237.0000 mL | Freq: Three times a day (TID) | ORAL | 2 refills | Status: AC
Start: 2021-09-18 — End: 2021-12-17

## 2021-09-18 MED ORDER — MAGNESIUM HYDROXIDE 400 MG/5ML PO SUSP: 30.0000 mL | Freq: Every day | ORAL | 0 refills | Status: DC | PRN

## 2021-09-18 MED ORDER — BOOST PLUS PO LIQD
237.0000 mL | Freq: Three times a day (TID) | ORAL | Status: DC
  Filled 2021-09-18 (×2): qty 237

## 2021-09-18 MED ORDER — ADULT MULTIVITAMIN W/MINERALS CH
1.0000 | ORAL_TABLET | Freq: Every day | ORAL | Status: DC
  Administered 2021-09-18: 1 via ORAL
  Filled 2021-09-18: qty 1

## 2021-09-18 MED ORDER — HEPARIN SOD (PORK) LOCK FLUSH 100 UNIT/ML IV SOLN
500.0000 [IU] | Freq: Once | INTRAVENOUS | Status: AC
Start: 2021-09-18 — End: 2021-09-18
  Administered 2021-09-18: 500 [IU] via INTRAVENOUS

## 2021-09-18 NOTE — Progress Notes (Signed)
Patient was discharged to home, avs reviewed and all questions answered. IV's removed, port was heparin locked and deaccessed, tele box returned. Wife provided transportation

## 2021-09-18 NOTE — Care Management Important Message (Signed)
Important Message  Patient Details IM Letter given to the Patient. Name: Aaron Johnston MRN: [id number] Date of Birth: 1945-12-16   Medicare Important Message Given:  Yes     Kerin Salen 09/18/2021, 11:47 AM

## 2021-09-18 NOTE — Progress Notes (Signed)
[facility] Gastroenterology Progress Note  Aaron Johnston 75 y.o. 09/10/1946  CC: GI bleed   Subjective: Patient seen and examined at bedside.  Feeling much better today.  Denies any bleeding episodes.  Hemoglobin stable.  ROS : Afebrile, negative for chest pain   Objective: Vital signs in last 24 hours: Vitals:   09/18/21 0315 09/18/21 0506  BP: 125/62 121/72  Pulse: 77 79  Resp: 16 17  Temp: 99.8 F (37.7 C) 99.3 F (37.4 C)  SpO2:  100%    Physical Exam:  General:  Alert, cooperative, no distress, appears stated age  Head:  Normocephalic, without obvious abnormality, atraumatic  Eyes:  , EOM's intact,   Lungs:   No visible respiratory distress  Heart:  Regular rate and rhythm, S1, S2 normal  Abdomen:   Soft, non-tender, nondistended, bowel sounds present.          Lab Results: Recent Labs    09/17/21 0456 09/18/21 0544  NA 138 140  K 3.4* 3.6  CL 113* 113*  CO2 19* 17*  GLUCOSE 98 108*  BUN 26* 19  CREATININE 1.20 1.19  CALCIUM 7.6* 7.8*   Recent Labs    09/15/21 1341  AST 35  ALT 61*  ALKPHOS 286*  BILITOT 1.1  PROT 5.8*  ALBUMIN 3.2*   Recent Labs    09/16/21 1455 09/17/21 0456 09/18/21 0544  WBC 5.8 5.7 6.0  NEUTROABS 4.5 4.4  --   HGB 6.7* 7.0* 8.5*  HCT 21.1* 21.4* 26.1*  MCV 90.9 91.8 92.2  PLT 40* 64* 69*   Recent Labs    09/15/21 1341 09/17/21 0522  LABPROT 15.3* 16.1*  INR 1.2 1.3*      Assessment/Plan: -Hematemesis .  EGD yesterday showed large esophageal varices.  3 bands placed.  Also showed gastric varices. -History of thrombocytopenia -Metastatic pancreatic adenocarcinoma -Acute blood loss anemia   Recommendations -------------------------- -Patient's hemoglobin is stable.  No further bleeding episodes. -He has been on octreotide since admission. -Start soft diet -Okay to discharge home on Protonix 40 mg once a day and propranolol 10 mg twice daily -May benefit from repeat EGD with band ligation for esophageal  varices but it would not change isolated gastric varices. May need IR consult if recurrent bleeding for evaluation of the BRTO/TIPS  depending on overall clinical prognosis -GI will sign off.  Call us back if needed -Follow-up in GI clinic in 4 to 6 weeks after discharge   Otis Brace MD, FACP 09/18/2021, 10:29 AM  Contact #  [phone]

## 2021-09-18 NOTE — Consult Note (Signed)
Consultation Note Date: 09/18/2021   Patient Name: Aaron Johnston  DOB: Sep 13, 1946  MRN: [id number]  Age / Sex: 75 y.o., male  PCP: [name], [name], MD Referring Physician: Hosie Poisson, MD  Reason for Consultation: Establishing goals of care  HPI/Patient Profile: 75 y.o. male  with past medical history of metastatic pancreatic cancer, thrombocytopenia, GERD, chronic pain, type 2 diabetes mellitus admitted on 09/15/2021 with maroon-colored stool and symptomatic anemia.  He was also recently admitted with generalized weakness and intermittent bloody stools and endoscopy was performed that admission.  Palliative consulted for goals of care.  Clinical Assessment and Goals of Care: Palliative care consult received.  Chart reviewed including personal review of pertinent labs and imaging.  I saw and examined Aaron Johnston today.  He was lying in bed in no distress.  His wife is present at the bedside as well.  We discussed that things most important to Aaron Johnston which includes his faith, his family, and his business.  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  We discussed his understanding of his situation.  He reports that the doctors have been doing a good job explaining things to him, however, he feels as though sometimes he gets a "more rosy picture" than the reality of his situation.  We discussed the fact he has incurable cancer and the goal of continued therapy is to add time and quality to his life.  He expressed understanding this.  We also discussed the importance of his nutrition, cognition, and functional status and his overall progression of disease and how these can be road signs as to how he is doing overall.  He does endorse having changes in  these domains over the past several months and was appreciative of frank conversation regarding the fact that these will continue to decline over time.  We discussed advanced directives including surrogate decision making, CODE STATUS, and how to make decisions status much time and quality to his life as possible in light of his incurable disease.  SUMMARY OF RECOMMENDATIONS   - Full code/full scope -Heading down for endoscopy to look for source of bleeding. - Plan to continue with trial of new line of chemotherapy.  He will continue conversations regarding goals of care with Dr. Benay Spice based upon his clinical course. -I dropped off a copy of hard choices for loving people for him to review at home.  Code Status/Advance Care Planning: Full code  Prognosis:  Unable to determine  Discharge Planning: To Be Determined      Primary Diagnoses: Present on Admission:  GI bleeding   I have reviewed the medical record, interviewed the patient and family, and examined the patient. The following aspects are pertinent.  Past Medical History:  Diagnosis Date   Diabetes mellitus without complication ([place])    Family history of breast cancer    Family  history of colon cancer    Family history of pancreatic cancer    Family history of stomach cancer    Social History   Socioeconomic History   Marital status: Married    Spouse name: Not on file   Number of children: Not on file   Years of education: Not on file   Highest education level: Not on file  Occupational History   Not on file  Tobacco Use   Smoking status: Former    Types: Cigarettes    Quit date: 08/17/1980    Years since quitting: 41.1   Smokeless tobacco: Never  Vaping Use   Vaping Use: Never used  Substance and Sexual Activity   Alcohol use: Not Currently   Drug use: Never   Sexual activity: Not on file  Other Topics Concern   Not on file  Social History Narrative   Not on file   Social Determinants of Health    Financial Resource Strain: Not on file  Food Insecurity: Not on file  Transportation Needs: Not on file  Physical Activity: Not on file  Stress: Not on file  Social Connections: Not on file   Family History  Problem Relation Age of Onset   Diabetes Mother    Pancreatic cancer Father        d. 33   Breast cancer Sister 52   Diabetes Sister    Diabetes Brother    Stomach cancer Paternal Aunt        2 pat aunts with stomach cancer   Colon cancer Paternal Uncle    Cancer Paternal Grandfather        NOS   Diabetes Sister    Diabetes Brother    Brain cancer Paternal Aunt    Cancer Paternal Aunt        eye   Diabetes Son    Scheduled Meds:  sodium chloride   Intravenous Once   Chlorhexidine Gluconate Cloth  6 each Topical Daily   insulin aspart  0-9 Units Subcutaneous TID AC & HS   lactose free nutrition  237 mL Oral TID WC   latanoprost  1 drop Both Eyes QHS   lipase/protease/amylase  72,000 Units Oral TID WC   magnesium oxide  400 mg Oral BID   multivitamin with minerals  1 tablet Oral Daily   [START ON 09/19/2021] pantoprazole  40 mg Intravenous Q12H   potassium chloride SA  20 mEq Oral Daily   Continuous Infusions:  sodium chloride 100 mL/hr at 09/17/21 1936   cefTRIAXone (ROCEPHIN)  IV 2 g (09/17/21 1940)   octreotide  (SANDOSTATIN)    IV infusion 50 mcg/hr (09/18/21 0931)   pantoprazole 8 mg/hr (09/17/21 1340)   PRN Meds:.acetaminophen **OR** acetaminophen, diphenhydrAMINE, diphenoxylate-atropine, famotidine, HYDROcodone-acetaminophen, lidocaine-prilocaine, loperamide, loratadine, magnesium hydroxide, ondansetron **OR** ondansetron (ZOFRAN) IV, phenol, prochlorperazine, propranolol, traZODone Medications Prior to Admission:  Prior to Admission medications   Medication Sig Start Date End Date Taking? Authorizing Provider  diphenhydrAMINE (BENADRYL) 50 MG capsule Take 50 mg by mouth daily as needed for itching (post-treatment).   Yes [provider]   diphenoxylate-atropine (LOMOTIL) 2.5-0.025 MG tablet Take 1-2 tablets by mouth 4 (four) times daily as needed for diarrhea or loose stools. 09/08/21  Yes Owens Shark, NP  famotidine (PEPCID) 10 MG tablet Take 10 mg by mouth daily as needed (for 1 week after each treatment, for itching).   Yes [provider]  HYDROcodone-acetaminophen (NORCO/VICODIN) 5-325 MG tablet Take 1-2 tablets by mouth every  4 (four) hours as needed. Patient taking differently: Take 1-2 tablets by mouth every 4 (four) hours as needed (for pain). 03/12/21  Yes Owens Shark, NP  KLOR-CON M20 20 MEQ tablet TAKE 1 TABLET BY MOUTH EVERY DAY Patient taking differently: Take 20 mEq by mouth daily. 09/02/21  Yes Ladell Pier, MD  latanoprost (XALATAN) 0.005 % ophthalmic solution Place 1 drop into both eyes at bedtime.   Yes [provider]  lidocaine-prilocaine (EMLA) cream Apply 1 application topically as directed. Apply to port site 1 hour prior to stick and cover with plastic wrap 07/23/20  Yes Ladell Pier, MD  lipase/protease/amylase (CREON) 36000 UNITS CPEP capsule Take 2 capsules (72,000 Units total) by mouth 3 (three) times daily with meals. May also take 1 capsule (36,000 Units total) as needed. Patient taking differently: Take 2 capsules (72,000 Units total) by mouth 3 (three) times daily with meals. May also take 1 capsule (36,000 Units total) as needed for diarrhea 08/06/21  Yes Owens Shark, NP  loperamide (IMODIUM) 2 MG capsule Take 2-4 mg by mouth 3 (three) times daily as needed for diarrhea or loose stools.   Yes [provider]  loratadine (CLARITIN) 10 MG tablet Take 10 mg by mouth daily as needed for itching (post-treatment).   Yes [provider]  magnesium oxide (MAG-OX) 400 MG tablet Take 1 tablet (400 mg total) by mouth 2 (two) times daily. 06/26/21  Yes Owens Shark, NP  ondansetron (ZOFRAN) 8 MG tablet Take 1 tablet (8 mg total) by mouth every 8 (eight) hours as  needed for nausea or vomiting. 07/23/20  Yes Ladell Pier, MD  pantoprazole (PROTONIX) 40 MG tablet Take 1 tablet (40 mg total) by mouth 2 (two) times daily. 09/01/21  Yes Patrecia Pour, MD  pioglitazone (ACTOS) 45 MG tablet Take 45 mg by mouth daily. 12/28/13  Yes [provider]  prochlorperazine (COMPAZINE) 10 MG tablet Take 1 tablet (10 mg total) by mouth every 6 (six) hours as needed for nausea. 07/23/20  Yes Ladell Pier, MD  Accu-Chek FastClix Lancets MISC Apply topically. 08/30/20   [provider]  ACCU-CHEK GUIDE test strip  08/31/20   [provider]  Blood Glucose Monitoring Suppl (ACCU-CHEK GUIDE) w/Device KIT See admin instructions. 08/03/20   [provider]  lactose free nutrition (BOOST PLUS) LIQD Take 237 mLs by mouth 3 (three) times daily with meals. 09/18/21 12/17/21  Hosie Poisson, MD  magnesium hydroxide (MILK OF MAGNESIA) 400 MG/5ML suspension Take 30 mLs by mouth daily as needed for mild constipation. 09/18/21   Hosie Poisson, MD  Multiple Vitamin (MULTIVITAMIN WITH MINERALS) TABS tablet Take 1 tablet by mouth daily. 09/18/21   Hosie Poisson, MD  propranolol (INDERAL) 10 MG tablet Take 1 tablet (10 mg total) by mouth 2 (two) times daily. 09/18/21   Hosie Poisson, MD  tadalafil (CIALIS) 20 MG tablet Take 20 mg by mouth daily as needed for erectile dysfunction. 06/11/21   [provider]   No Known Allergies Review of Systems  Constitutional:  Positive for fatigue.  Gastrointestinal:  Positive for abdominal pain and anal bleeding.  Neurological:  Positive for weakness.   Physical Exam General: Alert, awake, in no acute distress.   HEENT: No bruits, no goiter, no JVD Heart: Regular rate and rhythm. No murmur appreciated. Lungs: Good air movement, clear Abdomen: Soft, nontender, nondistended, positive bowel sounds.   Ext: No significant edema Skin: Warm and dry Neuro: Grossly intact,  nonfocal.   Vital Signs: BP (!) 123/101 (BP  Location: Left Leg)   Pulse 76   Temp 98 F (36.7 C)   Resp 16   Ht _0  (1.727 m)   Wt 61 kg   SpO2 95%   BMI 20.45 kg/m  Pain Scale: 0-10   Pain Score: 0-No pain   SpO2: SpO2: 95 % O2 Device:SpO2: 95 % O2 Flow Rate: .   IO: Intake/output summary:  Intake/Output Summary (Last 24 hours) at 09/18/2021 1747 Last data filed at 09/18/2021 1300 Gross per 24 hour  Intake 1992 ml  Output --  Net 1992 ml    LBM: Last BM Date: 09/18/21 Baseline Weight: Weight: 61 kg Most recent weight: Weight: 61 kg     Palliative Assessment/Data:   Flowsheet Rows    Flowsheet Row Most Recent Value  Intake Tab   Referral Department Hospitalist  Unit at Time of Referral Med/Surg Unit  Palliative Care Primary Diagnosis Cancer  Date Notified 09/15/21  Clinical Assessment   Psychosocial & Spiritual Assessment   Palliative Care Outcomes       Start 12:15 End: 6720  Time Total: 60 minutes  Greater than 50%  of this time was spent counseling and coordinating care related to the above assessment and plan.  Signed by: Micheline Rough, MD   Please contact Palliative Medicine Team phone at [phone] for questions and concerns.  For individual provider: See Shea Evans

## 2021-09-18 NOTE — Discharge Summary (Signed)
Physician Discharge Summary  Aaron Johnston:[id number] DOB: October 23, 1946 DOA: 09/15/2021  PCP: [name], [name], MD  Admit date: 09/15/2021 Discharge date: 09/18/2021  Admitted From: Home Disposition:  Home.   Recommendations for Outpatient Follow-up:  Follow up with PCP in 1-2 weeks Please obtain BMP/CBC in one week Please follow up with gastroenterology in 4 to 6 weeks for a repeat EGD.  Please follow up with oncology as recommended next week.   Discharge Condition:stable.  CODE STATUS:full code. Diet recommendation: Heart Healthy   Brief/Interim Summary:  Aaron Johnston is a 75 y.o. African-American male with medical history significant for metastatic pancreatic cancer, thrombocytopenia, GERD, chronic pain and type 2 diabetes mellitus, who presented to the emergency room with acute onset of abnormal labs with symptomatic anemia. The patient has been having maroon-colored stools and admitted to blood-tinged vomitus over the weekend.  He continues to have generalized weakness and admitted to intermittent episodes of bloody stools with bright red bleeding per rectum as well as occasional hematemesis.  Discharge Diagnoses:  Active Problems:   GI bleeding  GI bleeding/thrombocytopenia Symptomatic acute blood loss anemia S/p total of 3 units of prbc transfusion and 2 units of platelets ,  GI consulted. And he underwent EGD showing large esophageal varices.  Advanced diet and pt denies any more bleeding.  Pt will benefit  from repeat EGD with band ligation for esophageal varices but it would not change isolated gastric varices. May need IR consult if recurrent bleeding for evaluation of the BRTO/TIPS  depending on overall clinical prognosis Recommend out patient follow up with GI in 4 to 6 weeks.       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     Type 2  diabetes mellitus Non insulin dependent.        History of esophageal varices Resume propranolol 10 mg bid on discharge.      Thrombocytopenia Unclear etiology 2 units of platelets ordered by GI. Platelets have improved to 69,000.   GERD On PPI therapy at home, continue the same.    Hypokalemia:  Replaced.      Hyponatremia:  Normalized.   Discharge Instructions   Allergies as of 09/18/2021   No Known Allergies      Medication List     TAKE these medications    Accu-Chek FastClix Lancets Misc Apply topically.   Accu-Chek Guide test strip Generic drug: glucose blood   Accu-Chek Guide w/Device Kit See admin instructions.   diphenhydrAMINE 50 MG capsule Commonly known as: BENADRYL Take 50 mg by mouth daily as needed for itching (post-treatment).   diphenoxylate-atropine 2.5-0.025 MG tablet Commonly known as: LOMOTIL Take 1-2 tablets by mouth 4 (four) times daily as needed for diarrhea or loose stools.   famotidine 10 MG tablet Commonly known as: PEPCID Take 10 mg by mouth daily as needed (for 1 week after each treatment, for itching).   HYDROcodone-acetaminophen 5-325 MG tablet Commonly known as: NORCO/VICODIN Take 1-2 tablets by mouth every 4 (four) hours as needed. What changed: reasons to take this   Klor-Con M20 20 MEQ tablet Generic drug: potassium chloride SA TAKE 1 TABLET BY MOUTH EVERY DAY What changed: how much to take   lactose free nutrition Liqd Take 237 mLs by mouth 3 (three) times daily with meals.   latanoprost 0.005 % ophthalmic solution Commonly known as: XALATAN Place 1 drop into both eyes at bedtime.   lidocaine-prilocaine cream  Commonly known as: EMLA Apply 1 application topically as directed. Apply to port site 1 hour prior to stick and cover with plastic wrap   lipase/protease/amylase 36000 UNITS Cpep capsule Commonly known as: Creon Take 2 capsules (72,000 Units total) by mouth 3 (three) times daily with meals. May  also take 1 capsule (36,000 Units total) as needed. What changed: See the new instructions.   loperamide 2 MG capsule Commonly known as: IMODIUM Take 2-4 mg by mouth 3 (three) times daily as needed for diarrhea or loose stools.   loratadine 10 MG tablet Commonly known as: CLARITIN Take 10 mg by mouth daily as needed for itching (post-treatment).   magnesium hydroxide 400 MG/5ML suspension Commonly known as: MILK OF MAGNESIA Take 30 mLs by mouth daily as needed for mild constipation.   magnesium oxide 400 MG tablet Commonly known as: MAG-OX Take 1 tablet (400 mg total) by mouth 2 (two) times daily.   multivitamin with minerals Tabs tablet Take 1 tablet by mouth daily.   ondansetron 8 MG tablet Commonly known as: ZOFRAN Take 1 tablet (8 mg total) by mouth every 8 (eight) hours as needed for nausea or vomiting.   pantoprazole 40 MG tablet Commonly known as: PROTONIX Take 1 tablet (40 mg total) by mouth 2 (two) times daily.   pioglitazone 45 MG tablet Commonly known as: ACTOS Take 45 mg by mouth daily.   prochlorperazine 10 MG tablet Commonly known as: COMPAZINE Take 1 tablet (10 mg total) by mouth every 6 (six) hours as needed for nausea.   propranolol 10 MG tablet Commonly known as: INDERAL Take 1 tablet (10 mg total) by mouth 2 (two) times daily. What changed:  when to take this reasons to take this   tadalafil 20 MG tablet Commonly known as: CIALIS Take 20 mg by mouth daily as needed for erectile dysfunction.        Follow-up Information     G[facility], [place] Follow up in 6 week(s).   Why: Follow-up for metastatic pancreatic cancer, cirrhosis, esophageal varices with bleeding Contact information: [address] [place] [address] [phone]                No Known Allergies  Consultations: Gastroenterology.    Procedures/Studies: CT Chest Wo Contrast  Result Date: 08/27/2021 CLINICAL DATA:  Pancreatic cancer, staging.  EXAM: CT CHEST WITHOUT CONTRAST TECHNIQUE: Multidetector CT imaging of the chest was performed following the standard protocol without IV contrast. COMPARISON:  An abdominal CT of 11/06/2020.  No correlate chest CT. FINDINGS: Cardiovascular: Left Port-A-Cath tip at mid right atrium. Aortic atherosclerosis. Normal heart size, without pericardial effusion. Left main and 3 vessel coronary artery calcification. Mediastinum/Nodes: No mediastinal or definite hilar adenopathy, given limitations of unenhanced CT. Mild distal esophageal wall thickening. Lungs/Pleura: No pleural fluid. Diffuse, peripheral predominant areas of nodularity and nodular consolidation. Compared to 11/06/2020 imaged lung bases, this is increased. An index left upper lobe pleural based nodular consolidation at 1.8 cm anteriorly on 65/4. Pleural-based right lower lobe 6 mm pulmonary nodule on 79/4. Upper Abdomen: Caudate lobe enlargement. Progressive hepatic metastasis including in segment 4 B at 2.0 cm on 138/2 (compared to 11/06/2020). Normal imaged portions of the spleen, adrenal glands, left kidney. Upper pole right renal cyst. Pancreatic primary is poorly evaluated. Musculoskeletal: Sclerotic lesion within the T2 vertebral body at 1.2 cm on 91/6. T7 mild to moderate compression deformity without ventral canal encroachment. IMPRESSION: 1. Since the abdominal CT of 11/06/2020 (no prior  chest CTs for comparison) progression of diffuse pulmonary nodularity and nodular consolidation which is favored to represent metastatic disease. Given distribution and morphology, an atypical infectious process is possible but felt less likely. 2. Hepatic metastasis, better evaluated on recent abdominal MRI. 3. No thoracic adenopathy. 4. Sclerotic lesion in the T3 vertebral body, suspicious for osseous metastasis. 5. T7 compression deformity. 6. Distal esophageal wall thickening, suspicious for esophagitis. 7. Coronary artery atherosclerosis. Aortic Atherosclerosis  (ICD10-I70.0). Electronically Signed   By: Abigail Miyamoto M.D.   On: 08/27/2021 11:44   DG Chest Port 1 View  Result Date: 09/15/2021 CLINICAL DATA:  Evaluate for pulmonary edema. EXAM: PORTABLE CHEST 1 VIEW COMPARISON:  Chest CT dated 08/26/2021. FINDINGS: Left-sided Port-A-Cath with tip at the cavoatrial junction. No focal consolidation, pleural effusion, or pneumothorax. Faint nodular densities likely corresponding to the density seen on the CT. The cardiac silhouette is within normal limits. No acute osseous pathology. IMPRESSION: No acute cardiopulmonary process. Electronically Signed   By: Anner Crete M.D.   On: 09/15/2021 19:13   CT Angio Abd/Pel w/ and/or w/o  Result Date: 08/27/2021 CLINICAL DATA:  Acute, nonlocalized abdominal pain blood in stool yesterday EXAM: CT ANGIOGRAPHY ABDOMEN TECHNIQUE: Multidetector CT imaging of the abdomen was performed using the standard protocol during bolus administration of intravenous contrast. Multiplanar reconstructed images and MIPs were obtained and reviewed to evaluate the vascular anatomy. CONTRAST:  170m OMNIPAQUE IOHEXOL 350 MG/ML SOLN COMPARISON:  MR of the abdomen from 8 days ago FINDINGS: VASCULAR Aorta: Diffuse atheromatous wall thickening of the aorta. Negative for aneurysm or dissection Celiac: Pancreatic mass with stable irregular and beaded appearance to the left gastric, splenic, and proximal hepatic arteries. No acute finding SMA: No acute finding or aneurysm. No evidence of angio dysplasia. No stenosis. Renals: Accessory right lower pole artery. No acute vascular finding. 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 Multiple varices in the upper abdomen including periesophageal and gastric. Splenomegaly. No acute venous occlusion is noted. Portal vein remains occluded  beginning at the level of the infiltrative pancreas mass. Review of the MIP images confirms the above findings. NON-VASCULAR Lower chest: Extensive reticulonodular opacity at the bases. Staging chest CT was performed yesterday. Hepatobiliary: Low-density liver masses attributed to metastatic disease given change from 2021, recently evaluated by MRI. The largest is in the lower central liver on 11:24 at 26 mm. Pancreas: Pancreas mass with background pancreas atrophy, mass difficult to measure given diffuse infiltrative appearance in the retroperitoneum. Spleen: Enlarged in the setting of portal hypertension. Adrenals/Urinary Tract: No hydronephrosis or adrenal mass. Stomach/Bowel: Moderate distension of the stomach by partially high-density semi-solid material. No active intraluminal bleeding is seen into stomach. There is submucosal low-density expansion of the ileum and proximal colon. Negative for bowel obstruction. Additional area of thickening at the rectum where there are internal hemorrhoids associated with the portal hypertension. Lymphatic: No discrete and measurable lymphadenopathy. Other: No ascites or pneumoperitoneum Musculoskeletal: No acute or aggressive finding IMPRESSION: 1. The stomach is moderately distended, possible intraluminal clot if no recent meal. No active intraluminal hemorrhage detected. 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 2. Ileal and proximal colonic wall edema which could be colitis or portal congestion. 3. Infiltrating pancreas mass, reference recent staging MRI of the abdomen Electronically Signed   By: JGilford SilviusD.  On: 08/27/2021 04:32      Subjective: Looking forward to going home. No new complaints.   Discharge Exam: Vitals:   09/18/21 1348 09/18/21 1404  BP: 114/69 (!) 123/101  Pulse: 73 76  Resp: 16 16  Temp: 98.5 F (36.9 C) 98 F (36.7 C)  SpO2: 100% 95%   Vitals:   09/18/21 0315  09/18/21 0506 09/18/21 1348 09/18/21 1404  BP: 125/62 121/72 114/69 (!) 123/101  Pulse: 77 79 73 76  Resp: '16 17 16 16  ' Temp: 99.8 F (37.7 C) 99.3 F (37.4 C) 98.5 F (36.9 C) 98 F (36.7 C)  TempSrc: Oral Oral Oral   SpO2:  100% 100% 95%  Weight:      Height:        General: Pt is alert, awake, not in acute distress Cardiovascular: RRR, S1/S2 +, no rubs, no gallops Respiratory: CTA bilaterally, no wheezing, no rhonchi Abdominal: Soft, NT, ND, bowel sounds + Extremities: no edema, no cyanosis    The results of significant diagnostics from this hospitalization (including imaging, microbiology, ancillary and laboratory) are listed below for reference.     Microbiology: Recent Results (from the past 240 hour(s))  Resp Panel by RT-PCR (Flu A&B, Covid) Nasopharyngeal Swab     Status: None   Collection Time: 09/15/21  8:03 PM   Specimen: Nasopharyngeal Swab; Nasopharyngeal(NP) swabs in vial transport medium  Result Value Ref Range Status   SARS Coronavirus 2 by RT PCR NEGATIVE NEGATIVE Final    Comment: (NOTE) SARS-CoV-2 target nucleic acids are NOT DETECTED.  The SARS-CoV-2 RNA is generally detectable in upper respiratory specimens during the acute phase of infection. The lowest concentration of SARS-CoV-2 viral copies this assay can detect is 138 copies/mL. A negative result does not preclude SARS-Cov-2 infection and should not be used as the sole basis for treatment or other patient management decisions. A negative result may occur with  improper specimen collection/handling, submission of specimen other than nasopharyngeal swab, presence of viral mutation(s) within the areas targeted by this assay, and inadequate number of viral copies(<138 copies/mL). A negative result must be combined with clinical observations, patient history, and epidemiological information. The expected result is Negative.  Fact Sheet for Patients:   EntrepreneurPulse.com.au  Fact Sheet for Healthcare Providers:  IncredibleEmployment.be  This test is no t yet approved or cleared by the [place] FDA and  has been authorized for detection and/or diagnosis of SARS-CoV-2 by FDA under an Emergency Use Authorization (EUA). This EUA will remain  in effect (meaning this test can be used) for the duration of the COVID-19 declaration under Section 564(b)(1) of the Act, 21 U.S.C.section 360bbb-3(b)(1), unless the authorization is terminated  or revoked sooner.       Influenza A by PCR NEGATIVE NEGATIVE Final   Influenza B by PCR NEGATIVE NEGATIVE Final    Comment: (NOTE) The Xpert Xpress SARS-CoV-2/FLU/RSV plus assay is intended as an aid in the diagnosis of influenza from Nasopharyngeal swab specimens and should not be used as a sole basis for treatment. Nasal washings and aspirates are unacceptable for Xpert Xpress SARS-CoV-2/FLU/RSV testing.  Fact Sheet for Patients: EntrepreneurPulse.com.au  Fact Sheet for Healthcare Providers: IncredibleEmployment.be  This test is not yet approved or cleared by the [place] FDA and has been authorized for detection and/or diagnosis of SARS-CoV-2 by FDA under an Emergency Use Authorization (EUA). This EUA will remain in effect (meaning this test can be used) for the duration of the COVID-19 declaration under Section 564(b)(1)  of the Act, 21 U.S.C. section 360bbb-3(b)(1), unless the authorization is terminated or revoked.  Performed at [facility], [place] [address] [place], [place] [address]      Labs: BNP (last 3 results) No results for input(s): BNP in the last 8760 hours. Basic Metabolic Panel: Recent Labs  Lab 09/15/21 1341 09/16/21 0435 09/17/21 0456 09/18/21 0544  NA 133* 134* 138 140  K 4.2 3.8 3.4* 3.6  CL 109 111 113* 113*  CO2 19* 17* 19* 17*  GLUCOSE 246* 114* 98 108*   BUN 31* 25* 26* 19  CREATININE 1.22 1.08 1.20 1.19  CALCIUM 8.0* 7.4* 7.6* 7.8*   Liver Function Tests: Recent Labs  Lab 09/15/21 1341  AST 35  ALT 61*  ALKPHOS 286*  BILITOT 1.1  PROT 5.8*  ALBUMIN 3.2*   No results for input(s): LIPASE, AMYLASE in the last 168 hours. No results for input(s): AMMONIA in the last 168 hours. CBC: Recent Labs  Lab 09/15/21 1036 09/15/21 1335 09/16/21 0435 09/16/21 1455 09/17/21 0456 09/18/21 0544  WBC 10.4 13.1* 5.7 5.8 5.7 6.0  NEUTROABS 8.8*  --   --  4.5 4.4  --   HGB 6.6* 7.2* 5.8* 6.7* 7.0* 8.5*  HCT 20.6* 22.9* 18.3* 21.1* 21.4* 26.1*  MCV 95.4 99.6 93.4 90.9 91.8 92.2  PLT 47* 53* 39* 40* 64* 69*   Cardiac Enzymes: No results for input(s): CKTOTAL, CKMB, CKMBINDEX, TROPONINI in the last 168 hours. BNP: Invalid input(s): POCBNP CBG: Recent Labs  Lab 09/17/21 1659 09/17/21 2150 09/18/21 0755 09/18/21 1158 09/18/21 1652  GLUCAP 119* 143* 99 144* 127*   D-Dimer No results for input(s): DDIMER in the last 72 hours. Hgb A1c No results for input(s): HGBA1C in the last 72 hours. Lipid Profile No results for input(s): CHOL, HDL, LDLCALC, TRIG, CHOLHDL, LDLDIRECT in the last 72 hours. Thyroid function studies No results for input(s): TSH, T4TOTAL, T3FREE, THYROIDAB in the last 72 hours.  Invalid input(s): FREET3 Anemia work up No results for input(s): VITAMINB12, FOLATE, FERRITIN, TIBC, IRON, RETICCTPCT in the last 72 hours. Urinalysis    Component Value Date/Time   COLORURINE YELLOW 09/16/2021 [place] 09/16/2021 0356   LABSPEC 1.015 09/16/2021 0356   PHURINE 5.0 09/16/2021 0356   GLUCOSEU NEGATIVE 09/16/2021 0356   HGBUR NEGATIVE 09/16/2021 0356   BILIRUBINUR NEGATIVE 09/16/2021 0356   KETONESUR NEGATIVE 09/16/2021 0356   PROTEINUR NEGATIVE 09/16/2021 0356   NITRITE NEGATIVE 09/16/2021 0356   LEUKOCYTESUR NEGATIVE 09/16/2021 0356   Sepsis Labs Invalid input(s): PROCALCITONIN,  WBC,   LACTICIDVEN Microbiology Recent Results (from the past 240 hour(s))  Resp Panel by RT-PCR (Flu A&B, Covid) Nasopharyngeal Swab     Status: None   Collection Time: 09/15/21  8:03 PM   Specimen: Nasopharyngeal Swab; Nasopharyngeal(NP) swabs in vial transport medium  Result Value Ref Range Status   SARS Coronavirus 2 by RT PCR NEGATIVE NEGATIVE Final    Comment: (NOTE) SARS-CoV-2 target nucleic acids are NOT DETECTED.  The SARS-CoV-2 RNA is generally detectable in upper respiratory specimens during the acute phase of infection. The lowest concentration of SARS-CoV-2 viral copies this assay can detect is 138 copies/mL. A negative result does not preclude SARS-Cov-2 infection and should not be used as the sole basis for treatment or other patient management decisions. A negative result may occur with  improper specimen collection/handling, submission of specimen other than nasopharyngeal swab, presence of viral mutation(s) within the areas targeted by this assay, and inadequate  number of viral copies(<138 copies/mL). A negative result must be combined with clinical observations, patient history, and epidemiological information. The expected result is Negative.  Fact Sheet for Patients:  EntrepreneurPulse.com.au  Fact Sheet for Healthcare Providers:  IncredibleEmployment.be  This test is no t yet approved or cleared by the [place] FDA and  has been authorized for detection and/or diagnosis of SARS-CoV-2 by FDA under an Emergency Use Authorization (EUA). This EUA will remain  in effect (meaning this test can be used) for the duration of the COVID-19 declaration under Section 564(b)(1) of the Act, 21 U.S.C.section 360bbb-3(b)(1), unless the authorization is terminated  or revoked sooner.       Influenza A by PCR NEGATIVE NEGATIVE Final   Influenza B by PCR NEGATIVE NEGATIVE Final    Comment: (NOTE) The Xpert Xpress SARS-CoV-2/FLU/RSV plus  assay is intended as an aid in the diagnosis of influenza from Nasopharyngeal swab specimens and should not be used as a sole basis for treatment. Nasal washings and aspirates are unacceptable for Xpert Xpress SARS-CoV-2/FLU/RSV testing.  Fact Sheet for Patients: EntrepreneurPulse.com.au  Fact Sheet for Healthcare Providers: IncredibleEmployment.be  This test is not yet approved or cleared by the [place] FDA and has been authorized for detection and/or diagnosis of SARS-CoV-2 by FDA under an Emergency Use Authorization (EUA). This EUA will remain in effect (meaning this test can be used) for the duration of the COVID-19 declaration under Section 564(b)(1) of the Act, 21 U.S.C. section 360bbb-3(b)(1), unless the authorization is terminated or revoked.  Performed at [facility], [place] [address] [place], [place] [address]      Time coordinating discharge: 36 minutes  SIGNED:   Hosie Poisson, MD  [facility]

## 2021-09-18 NOTE — Progress Notes (Signed)
Initial Nutrition Assessment  DOCUMENTATION CODES:   Severe malnutrition in context of chronic illness  INTERVENTION:   -Boost Plus chocolate- Each supplement provides 360kcal and 14g protein.     -Multivitamin with minerals daily  -Reviewed iron-rich foods with patient  NUTRITION DIAGNOSIS:   Severe Malnutrition related to chronic illness, cancer and cancer related treatments as evidenced by severe fat depletion, severe muscle depletion.  GOAL:   Patient will meet greater than or equal to 90% of their needs  MONITOR:   PO intake, Supplement acceptance, Labs, Weight trends, I & O's  REASON FOR ASSESSMENT:   Malnutrition Screening Tool    ASSESSMENT:   75 y.o. African-American male with medical history significant for metastatic pancreatic cancer, thrombocytopenia, GERD, chronic pain and type 2 diabetes mellitus, who presented to the emergency room with acute onset of abnormal labs with symptomatic anemia. The patient has been having maroon-colored stools and admitted to blood-tinged vomitus over the weekend.  He continues to have generalized weakness and admitted to intermittent episodes of bloody stools with bright red bleeding per rectum as well as occasional hematemesis.  11/3: s/p EGD: esophageal banding  Patient in room with wife at bedside. Pt reports eating well but states that was never his issue. Pt states he has had continued diarrhea but receiving Creon. No longer having vomiting.  Diet was just advanced from fulls to soft. Looking forward to eating solid food.  Pt ate cream of wheat, some soup and OJ for breakfast. Pt states he drinks Boost at home, will order. Denies issues with taste, chewing or swallowing.  Per weight records, pt has lost 17 lbs since 1/17 (11% wt loss x 9.5 months, insignificant for time frame).  Medications: Creon, MAG-OX, KLOR-CON, Octreotide  Labs reviewed:  CBGs: 99-144  NUTRITION - FOCUSED PHYSICAL EXAM:  Flowsheet Row Most  Recent Value  Orbital Region Moderate depletion  Upper Arm Region Severe depletion  Thoracic and Lumbar Region Severe depletion  Buccal Region Moderate depletion  Temple Region Moderate depletion  Clavicle Bone Region Severe depletion  Clavicle and Acromion Bone Region Severe depletion  Scapular Bone Region Severe depletion  Dorsal Hand Severe depletion  Patellar Region Moderate depletion  Anterior Thigh Region Moderate depletion  Posterior Calf Region Severe depletion  Edema (RD Assessment) None  Hair Unable to assess  [no hair]  Eyes Reviewed  Mouth Reviewed  Skin Reviewed       Diet Order:   Diet Order             DIET SOFT Room service appropriate? Yes; Fluid consistency: Thin  Diet effective now                   EDUCATION NEEDS:   Education needs have been addressed  Skin:  Skin Assessment: Reviewed RN Assessment  Last BM:  11/4  Height:   Ht Readings from Last 1 Encounters:  09/15/21 5\' 8"  (1.727 m)    Weight:   Wt Readings from Last 1 Encounters:  09/15/21 61 kg    BMI:  Body mass index is 20.45 kg/m.  Estimated Nutritional Needs:   Kcal:  2150-2350  Protein:  100-120g  Fluid:  2.3L/day  Clayton Bibles, MS, RD, LDN Inpatient Clinical Dietitian Contact information available via Amion

## 2021-09-20 ENCOUNTER — Other Ambulatory Visit: Payer: Self-pay | Admitting: Oncology

## 2021-09-21 LAB — BPAM RBC
Blood Product Expiration Date: 202211282359
Blood Product Expiration Date: 202211282359
Blood Product Expiration Date: 202211282359
Blood Product Expiration Date: 202211282359
ISSUE DATE / TIME: 202211011729
ISSUE DATE / TIME: 202211020611
ISSUE DATE / TIME: 202211022031
ISSUE DATE / TIME: 202211040035
Unit Type and Rh: 6200
Unit Type and Rh: 6200
Unit Type and Rh: 6200
Unit Type and Rh: 6200

## 2021-09-21 LAB — TYPE AND SCREEN
ABO/RH(D): A POS
Antibody Screen: NEGATIVE
Unit division: 0
Unit division: 0
Unit division: 0
Unit division: 0

## 2021-09-22 ENCOUNTER — Inpatient Hospital Stay: Payer: Medicare Other

## 2021-09-22 ENCOUNTER — Inpatient Hospital Stay (HOSPITAL_BASED_OUTPATIENT_CLINIC_OR_DEPARTMENT_OTHER): Payer: Medicare Other | Admitting: Oncology

## 2021-09-22 ENCOUNTER — Other Ambulatory Visit: Payer: Self-pay

## 2021-09-22 ENCOUNTER — Other Ambulatory Visit: Payer: Self-pay | Admitting: Gastroenterology

## 2021-09-22 VITALS — BP 111/72 | HR 74 | Temp 98.1°F | Resp 20 | Ht 68.0 in | Wt 136.4 lb

## 2021-09-22 DIAGNOSIS — R059 Cough, unspecified: Secondary | ICD-10-CM | POA: Diagnosis not present

## 2021-09-22 DIAGNOSIS — K922 Gastrointestinal hemorrhage, unspecified: Secondary | ICD-10-CM | POA: Diagnosis not present

## 2021-09-22 DIAGNOSIS — E119 Type 2 diabetes mellitus without complications: Secondary | ICD-10-CM | POA: Diagnosis not present

## 2021-09-22 DIAGNOSIS — R000 Tachycardia, unspecified: Secondary | ICD-10-CM | POA: Diagnosis not present

## 2021-09-22 DIAGNOSIS — C787 Secondary malignant neoplasm of liver and intrahepatic bile duct: Secondary | ICD-10-CM | POA: Diagnosis present

## 2021-09-22 DIAGNOSIS — R0981 Nasal congestion: Secondary | ICD-10-CM | POA: Diagnosis not present

## 2021-09-22 DIAGNOSIS — D649 Anemia, unspecified: Secondary | ICD-10-CM | POA: Diagnosis not present

## 2021-09-22 DIAGNOSIS — G629 Polyneuropathy, unspecified: Secondary | ICD-10-CM | POA: Diagnosis not present

## 2021-09-22 DIAGNOSIS — C259 Malignant neoplasm of pancreas, unspecified: Secondary | ICD-10-CM

## 2021-09-22 DIAGNOSIS — D702 Other drug-induced agranulocytosis: Secondary | ICD-10-CM | POA: Diagnosis not present

## 2021-09-22 DIAGNOSIS — Z5111 Encounter for antineoplastic chemotherapy: Secondary | ICD-10-CM | POA: Diagnosis present

## 2021-09-22 DIAGNOSIS — M549 Dorsalgia, unspecified: Secondary | ICD-10-CM | POA: Diagnosis not present

## 2021-09-22 DIAGNOSIS — C250 Malignant neoplasm of head of pancreas: Secondary | ICD-10-CM

## 2021-09-22 DIAGNOSIS — Z95828 Presence of other vascular implants and grafts: Secondary | ICD-10-CM

## 2021-09-22 DIAGNOSIS — I10 Essential (primary) hypertension: Secondary | ICD-10-CM | POA: Diagnosis not present

## 2021-09-22 DIAGNOSIS — R0602 Shortness of breath: Secondary | ICD-10-CM | POA: Diagnosis not present

## 2021-09-22 DIAGNOSIS — D696 Thrombocytopenia, unspecified: Secondary | ICD-10-CM | POA: Diagnosis not present

## 2021-09-22 LAB — CBC WITH DIFFERENTIAL (CANCER CENTER ONLY)
Abs Immature Granulocytes: 0.05 10*3/uL (ref 0.00–0.07)
Basophils Absolute: 0 10*3/uL (ref 0.0–0.1)
Basophils Relative: 1 %
Eosinophils Absolute: 0.2 10*3/uL (ref 0.0–0.5)
Eosinophils Relative: 3 %
HCT: 31.3 % — ABNORMAL LOW (ref 39.0–52.0)
Hemoglobin: 9.7 g/dL — ABNORMAL LOW (ref 13.0–17.0)
Immature Granulocytes: 1 %
Lymphocytes Relative: 11 %
Lymphs Abs: 0.8 10*3/uL (ref 0.7–4.0)
MCH: 29.5 pg (ref 26.0–34.0)
MCHC: 31 g/dL (ref 30.0–36.0)
MCV: 95.1 fL (ref 80.0–100.0)
Monocytes Absolute: 0.3 10*3/uL (ref 0.1–1.0)
Monocytes Relative: 4 %
Neutro Abs: 6 10*3/uL (ref 1.7–7.7)
Neutrophils Relative %: 80 %
Platelet Count: 101 10*3/uL — ABNORMAL LOW (ref 150–400)
RBC: 3.29 MIL/uL — ABNORMAL LOW (ref 4.22–5.81)
RDW: 18.9 % — ABNORMAL HIGH (ref 11.5–15.5)
WBC Count: 7.4 10*3/uL (ref 4.0–10.5)
nRBC: 0 % (ref 0.0–0.2)

## 2021-09-22 LAB — CMP (CANCER CENTER ONLY)
ALT: 31 U/L (ref 0–44)
AST: 23 U/L (ref 15–41)
Albumin: 3.5 g/dL (ref 3.5–5.0)
Alkaline Phosphatase: 241 U/L — ABNORMAL HIGH (ref 38–126)
Anion gap: 8 (ref 5–15)
BUN: 11 mg/dL (ref 8–23)
CO2: 17 mmol/L — ABNORMAL LOW (ref 22–32)
Calcium: 8.2 mg/dL — ABNORMAL LOW (ref 8.9–10.3)
Chloride: 116 mmol/L — ABNORMAL HIGH (ref 98–111)
Creatinine: 1.23 mg/dL (ref 0.61–1.24)
GFR, Estimated: >60 mL/min (ref >60)
Glucose, Bld: 180 mg/dL — ABNORMAL HIGH (ref 70–99)
Potassium: 4 mmol/L (ref 3.5–5.1)
Sodium: 141 mmol/L (ref 135–145)
Total Bilirubin: 0.9 mg/dL (ref 0.3–1.2)
Total Protein: 5.9 g/dL — ABNORMAL LOW (ref 6.5–8.1)

## 2021-09-22 MED ORDER — SODIUM CHLORIDE 0.9 % IV SOLN
1800.0000 mg/m2 | INTRAVENOUS | Status: DC
  Administered 2021-09-22: 3050 mg via INTRAVENOUS
  Filled 2021-09-22: qty 61

## 2021-09-22 MED ORDER — SODIUM CHLORIDE 0.9 % IV SOLN
40.0000 mg/m2 | Freq: Once | INTRAVENOUS | Status: AC
  Administered 2021-09-22: 68.8 mg via INTRAVENOUS
  Filled 2021-09-22: qty 16

## 2021-09-22 MED ORDER — SODIUM CHLORIDE 0.9 % IV SOLN
300.0000 mg/m2 | Freq: Once | INTRAVENOUS | Status: AC
  Administered 2021-09-22: 508 mg via INTRAVENOUS
  Filled 2021-09-22: qty 25.4

## 2021-09-22 MED ORDER — PALONOSETRON HCL INJECTION 0.25 MG/5ML
0.2500 mg | Freq: Once | INTRAVENOUS | Status: AC
  Administered 2021-09-22: 0.25 mg via INTRAVENOUS
  Filled 2021-09-22: qty 5

## 2021-09-22 MED ORDER — SODIUM CHLORIDE 0.9 % IV SOLN
10.0000 mg | Freq: Once | INTRAVENOUS | Status: AC
  Administered 2021-09-22: 10 mg via INTRAVENOUS
  Filled 2021-09-22: qty 1

## 2021-09-22 MED ORDER — SODIUM CHLORIDE 0.9 % IV SOLN: Freq: Once | INTRAVENOUS | Status: AC

## 2021-09-22 MED ORDER — ROMIPLOSTIM 125 MCG ~~LOC~~ SOLR
2.0000 ug/kg | Freq: Once | SUBCUTANEOUS | Status: AC
  Administered 2021-09-22: 125 ug via SUBCUTANEOUS
  Filled 2021-09-22: qty 0.25

## 2021-09-22 NOTE — Patient Instructions (Addendum)
Aaron Johnston   Discharge Instructions: Thank you for choosing [facility] to provide your oncology and hematology care.   If you have a lab appointment with the [facility], please go directly to the [facility] and check in at the registration area.   Wear comfortable clothing and clothing appropriate for easy access to any Portacath or PICC line.   We strive to give you quality time with your provider. You may need to reschedule your appointment if you arrive late (15 or more minutes).  Arriving late affects you and other patients whose appointments are after yours.  Also, if you miss three or more appointments without notifying the office, you may be dismissed from the clinic at the provider's discretion.      For prescription refill requests, have your pharmacy contact our office and allow 72 hours for refills to be completed.    Today you received the following chemotherapy and/or immunotherapy agents Irinotecan liposome, leucovorin, fluorouracil   N PLATE   To help prevent nausea and vomiting after your treatment, we encourage you to take your nausea medication as directed.  BELOW ARE SYMPTOMS THAT SHOULD BE REPORTED IMMEDIATELY: *FEVER GREATER THAN 100.4 F (38 C) OR HIGHER *CHILLS OR SWEATING *NAUSEA AND VOMITING THAT IS NOT CONTROLLED WITH YOUR NAUSEA MEDICATION *UNUSUAL SHORTNESS OF BREATH *UNUSUAL BRUISING OR BLEEDING *URINARY PROBLEMS (pain or burning when urinating, or frequent urination) *BOWEL PROBLEMS (unusual diarrhea, constipation, pain near the anus) TENDERNESS IN MOUTH AND THROAT WITH OR WITHOUT PRESENCE OF ULCERS (sore throat, sores in mouth, or a toothache) UNUSUAL RASH, SWELLING OR PAIN  UNUSUAL VAGINAL DISCHARGE OR ITCHING   Items with * indicate a potential emergency and should be followed up as soon as possible or go to the Emergency Department if any problems should occur.  Please show the CHEMOTHERAPY ALERT CARD or  IMMUNOTHERAPY ALERT CARD at check-in to the Emergency Department and triage nurse.  Should you have questions after your visit or need to cancel or reschedule your appointment, please contact [facility]  Dept: [phone]  and follow the prompts.  Office hours are 8:00 a.m. to 4:30 p.m. Monday - Friday. Please note that voicemails left after 4:00 p.m. may not be returned until the following business day.  We are closed weekends and major holidays. You have access to a nurse at all times for urgent questions. Please call the main number to the clinic Dept: [phone] and follow the prompts.   For any non-urgent questions, you may also contact your provider using MyChart. We now offer e-Visits for anyone 16 and older to request care online for non-urgent symptoms. For details visit [email or web address].   Also download the MyChart app! Go to the app store, search "MyChart", open the app, select [facility], and log in with your MyChart username and password.  Due to Covid, a mask is required upon entering the hospital/clinic. If you do not have a mask, one will be given to you upon arrival. For doctor visits, patients may have 1 support person aged 59 or older with them. For treatment visits, patients cannot have anyone with them due to current Covid guidelines and our immunocompromised population.   Irinotecan Liposomal injection What is this medication? IRINOTECAN LIPOSOME (eye ri noe TEE kan LIP oh som) is a chemotherapy drug. It is used to treat pancreatic cancer. This medicine may be used for other purposes; ask your health care provider or pharmacist if  you have questions. COMMON BRAND NAME(S): ONIVYDE What should I tell my care team before I take this medication? They need to know if you have any of these conditions: bleeding disorders dehydration history of blood diseases, like sickle cell anemia or leukemia history of low levels of calcium, magnesium, or  potassium in the blood infection (especially a virus infection such as chickenpox, cold sores, or herpes) liver disease low blood counts, like low white cell, platelet, or red cell counts lung or breathing disease, like asthma recent or ongoing radiation therapy an unusual or allergic reaction to irinotecan liposome, other medicines, foods, dyes, or preservatives pregnant or trying to get pregnant breast-feeding How should I use this medication? This drug is given as an infusion into a vein. It is administered in a hospital or clinic by a specially trained health care professional. Talk to your pediatrician regarding the use of this medicine in children. Special care may be needed. Overdosage: If you think you have taken too much of this medicine contact a poison control center or emergency room at once. NOTE: This medicine is only for you. Do not share this medicine with others. What if I miss a dose? It is important not to miss your dose. Call your doctor or health care professional if you are unable to keep an appointment. What may interact with this medication? This medicine may interact with the following medications: antiviral medicines for HIV or AIDS certain medications for fungal infections like ketoconazole, itraconazole, voriconazole certain medications for seizures like carbamazepine, fosphenytoin, phenytoin clarithromycin gemfibrozil mephobarbital nefazodone phenobarbital primidone rifabutin rifampin rifapentine St. John's Wort telaprevir This list may not describe all possible interactions. Give your health care provider a list of all the medicines, herbs, non-prescription drugs, or dietary supplements you use. Also tell them if you smoke, drink alcohol, or use illegal drugs. Some items may interact with your medicine. What should I watch for while using this medication? Check with your doctor or health care professional if you get an attack of severe diarrhea, nausea  and vomiting, or if you sweat a lot. The loss of too much body fluid can make it dangerous for you to take this medicine. This medicine may interfere with the ability to have a child. You should talk with your doctor or health care professional if you are concerned about your fertility. Do not become pregnant while taking this medicine or for 1 month after the last dose; males with male partners should use condoms during treatment and for 4 months after the last dose. Women should inform their doctor if they wish to become pregnant or think they might be pregnant. There is a potential for serious side effects to an unborn child. Talk to your health care professional or pharmacist for more information. Do not breast-feed an infant while taking this medicine or for 1 month after the last dose. Avoid taking products that contain aspirin, acetaminophen, ibuprofen, naproxen, or ketoprofen unless instructed by your doctor. These medicines may hide a fever. Be careful brushing and flossing your teeth or using a toothpick because you may get an infection or bleed more easily. If you have any dental work done, tell your dentist you are receiving this medicine. Call your doctor or health care professional for advice if you get a fever, chills or sore throat, or other symptoms of a cold or flu. Do not treat yourself. This drug decreases your body's ability to fight infections. Try to avoid being around people who are sick. This  medicine may increase your risk to bruise or bleed. Call your doctor or health care professional if you notice any unusual bleeding. This drug may make you feel generally unwell. This is not uncommon, as chemotherapy can affect healthy cells as well as cancer cells. Report any side effects. Continue your course of treatment even though you feel ill unless your doctor tells you to stop. What side effects may I notice from receiving this medication? Side effects that you should report to your  doctor or health care professional as soon as possible: allergic reactions like skin rash, itching or hives, swelling of the face, lips, or tongue breathing problems cough diarrhea low blood counts - this medicine may decrease the number of white blood cells, red blood cells and platelets. You may be at increased risk for infections and bleeding nausea, vomiting signs and symptoms of bleeding such as bloody or black, tarry stools; red or dark-brown urine; spitting up blood or brown material that looks like coffee grounds; red spots on the skin; unusual bruising or bleeding from the eye, gums, or nose signs of decreased red blood cells - unusually weak or tired, feeling faint or lightheaded, falls signs and symptoms of infection like fever or chills; cough; sore throat; pain or trouble passing urine signs and symptoms of liver injury like dark yellow or brown urine; general ill feeling or flu-like symptoms; light-colored stools; loss of appetite; nausea; right upper belly pain; unusually weak or tired; yellowing of the eyes or skin stomach pain Side effects that usually do not require medical attention (report to your doctor or health care professional if they continue or are bothersome): hair loss loss of appetite mouth sores upset stomach This list may not describe all possible side effects. Call your doctor for medical advice about side effects. You may report side effects to FDA at 1-800-FDA-1088. Where should I keep my medication? This drug is given in a hospital or clinic and will not be stored at home. NOTE: This sheet is a summary. It may not cover all possible information. If you have questions about this medicine, talk to your doctor, pharmacist, or health care provider.  2022 Elsevier/Gold Standard (2015-12-04 00:00:00)  Leucovorin injection What is this medication? LEUCOVORIN (loo koe VOR in) is used to prevent or treat the harmful effects of some medicines. This medicine is used  to treat anemia caused by a low amount of folic acid in the body. It is also used with 5-fluorouracil (5-FU) to treat colon cancer. This medicine may be used for other purposes; ask your health care provider or pharmacist if you have questions. What should I tell my care team before I take this medication? They need to know if you have any of these conditions: anemia from low levels of vitamin B-12 in the blood an unusual or allergic reaction to leucovorin, folic acid, other medicines, foods, dyes, or preservatives pregnant or trying to get pregnant breast-feeding How should I use this medication? This medicine is for injection into a muscle or into a vein. It is given by a health care professional in a hospital or clinic setting. Talk to your pediatrician regarding the use of this medicine in children. Special care may be needed. Overdosage: If you think you have taken too much of this medicine contact a poison control center or emergency room at once. NOTE: This medicine is only for you. Do not share this medicine with others. What if I miss a dose? This does not apply. What may  interact with this medication? capecitabine fluorouracil phenobarbital phenytoin primidone trimethoprim-sulfamethoxazole This list may not describe all possible interactions. Give your health care provider a list of all the medicines, herbs, non-prescription drugs, or dietary supplements you use. Also tell them if you smoke, drink alcohol, or use illegal drugs. Some items may interact with your medicine. What should I watch for while using this medication? Your condition will be monitored carefully while you are receiving this medicine. This medicine may increase the side effects of 5-fluorouracil, 5-FU. Tell your doctor or health care professional if you have diarrhea or mouth sores that do not get better or that get worse. What side effects may I notice from receiving this medication? Side effects that you  should report to your doctor or health care professional as soon as possible: allergic reactions like skin rash, itching or hives, swelling of the face, lips, or tongue breathing problems fever, infection mouth sores unusual bleeding or bruising unusually weak or tired Side effects that usually do not require medical attention (report to your doctor or health care professional if they continue or are bothersome): constipation or diarrhea loss of appetite nausea, vomiting This list may not describe all possible side effects. Call your doctor for medical advice about side effects. You may report side effects to FDA at 1-800-FDA-1088. Where should I keep my medication? This drug is given in a hospital or clinic and will not be stored at home. NOTE: This sheet is a summary. It may not cover all possible information. If you have questions about this medicine, talk to your doctor, pharmacist, or health care provider.  2022 Elsevier/Gold Standard (2008-05-09 00:00:00)  Fluorouracil, 5-FU injection What is this medication? FLUOROURACIL, 5-FU (flure oh YOOR a sil) is a chemotherapy drug. It slows the growth of cancer cells. This medicine is used to treat many types of cancer like breast cancer, colon or rectal cancer, pancreatic cancer, and stomach cancer. This medicine may be used for other purposes; ask your health care provider or pharmacist if you have questions. COMMON BRAND NAME(S): Adrucil What should I tell my care team before I take this medication? They need to know if you have any of these conditions: blood disorders dihydropyrimidine dehydrogenase (DPD) deficiency infection (especially a virus infection such as chickenpox, cold sores, or herpes) kidney disease liver disease malnourished, poor nutrition recent or ongoing radiation therapy an unusual or allergic reaction to fluorouracil, other chemotherapy, other medicines, foods, dyes, or preservatives pregnant or trying to get  pregnant breast-feeding How should I use this medication? This drug is given as an infusion or injection into a vein. It is administered in a hospital or clinic by a specially trained health care professional. Talk to your pediatrician regarding the use of this medicine in children. Special care may be needed. Overdosage: If you think you have taken too much of this medicine contact a poison control center or emergency room at once. NOTE: This medicine is only for you. Do not share this medicine with others. What if I miss a dose? It is important not to miss your dose. Call your doctor or health care professional if you are unable to keep an appointment. What may interact with this medication? Do not take this medicine with any of the following medications: live virus vaccines This medicine may also interact with the following medications: medicines that treat or prevent blood clots like warfarin, enoxaparin, and dalteparin This list may not describe all possible interactions. Give your health care provider a list  of all the medicines, herbs, non-prescription drugs, or dietary supplements you use. Also tell them if you smoke, drink alcohol, or use illegal drugs. Some items may interact with your medicine. What should I watch for while using this medication? Visit your doctor for checks on your progress. This drug may make you feel generally unwell. This is not uncommon, as chemotherapy can affect healthy cells as well as cancer cells. Report any side effects. Continue your course of treatment even though you feel ill unless your doctor tells you to stop. In some cases, you may be given additional medicines to help with side effects. Follow all directions for their use. Call your doctor or health care professional for advice if you get a fever, chills or sore throat, or other symptoms of a cold or flu. Do not treat yourself. This drug decreases your body's ability to fight infections. Try to avoid  being around people who are sick. This medicine may increase your risk to bruise or bleed. Call your doctor or health care professional if you notice any unusual bleeding. Be careful brushing and flossing your teeth or using a toothpick because you may get an infection or bleed more easily. If you have any dental work done, tell your dentist you are receiving this medicine. Avoid taking products that contain aspirin, acetaminophen, ibuprofen, naproxen, or ketoprofen unless instructed by your doctor. These medicines may hide a fever. Do not become pregnant while taking this medicine. Women should inform their doctor if they wish to become pregnant or think they might be pregnant. There is a potential for serious side effects to an unborn child. Talk to your health care professional or pharmacist for more information. Do not breast-feed an infant while taking this medicine. Men should inform their doctor if they wish to father a child. This medicine may lower sperm counts. Do not treat diarrhea with over the counter products. Contact your doctor if you have diarrhea that lasts more than 2 days or if it is severe and watery. This medicine can make you more sensitive to the sun. Keep out of the sun. If you cannot avoid being in the sun, wear protective clothing and use sunscreen. Do not use sun lamps or tanning beds/booths. What side effects may I notice from receiving this medication? Side effects that you should report to your doctor or health care professional as soon as possible: allergic reactions like skin rash, itching or hives, swelling of the face, lips, or tongue low blood counts - this medicine may decrease the number of white blood cells, red blood cells and platelets. You may be at increased risk for infections and bleeding. signs of infection - fever or chills, cough, sore throat, pain or difficulty passing urine signs of decreased platelets or bleeding - bruising, pinpoint red spots on the  skin, black, tarry stools, blood in the urine signs of decreased red blood cells - unusually weak or tired, fainting spells, lightheadedness breathing problems changes in vision chest pain mouth sores nausea and vomiting pain, swelling, redness at site where injected pain, tingling, numbness in the hands or feet redness, swelling, or sores on hands or feet stomach pain unusual bleeding Side effects that usually do not require medical attention (report to your doctor or health care professional if they continue or are bothersome): changes in finger or toe nails diarrhea dry or itchy skin hair loss headache loss of appetite sensitivity of eyes to the light stomach upset unusually teary eyes This list may not describe  all possible side effects. Call your doctor for medical advice about side effects. You may report side effects to FDA at 1-800-FDA-1088. Where should I keep my medication? This drug is given in a hospital or clinic and will not be stored at home. NOTE: This sheet is a summary. It may not cover all possible information. If you have questions about this medicine, talk to your doctor, pharmacist, or health care provider.  2022 Elsevier/Gold Standard (2021-07-21 00:00:00)  Romiplostim injection What is this medication? ROMIPLOSTIM (roe mi PLOE stim) helps your body make more platelets. This medicine is used to treat low platelets caused by chronic idiopathic thrombocytopenic purpura (ITP) or a bone marrow syndrome caused by radiation sickness. This medicine may be used for other purposes; ask your health care provider or pharmacist if you have questions. COMMON BRAND NAME(S): Nplate What should I tell my care team before I take this medication? They need to know if you have any of these conditions: blood clots myelodysplastic syndrome an unusual or allergic reaction to romiplostim, mannitol, other medicines, foods, dyes, or preservatives pregnant or trying to get  pregnant breast-feeding How should I use this medication? This medicine is injected under the skin. It is given by a health care provider in a hospital or clinic setting. A special MedGuide will be given to you before each treatment. Be sure to read this information carefully each time. Talk to your health care provider about the use of this medicine in children. While it may be prescribed for children as young as newborns for selected conditions, precautions do apply. Overdosage: If you think you have taken too much of this medicine contact a poison control center or emergency room at once. NOTE: This medicine is only for you. Do not share this medicine with others. What if I miss a dose? Keep appointments for follow-up doses. It is important not to miss your dose. Call your health care provider if you are unable to keep an appointment. What may interact with this medication? Interactions are not expected. This list may not describe all possible interactions. Give your health care provider a list of all the medicines, herbs, non-prescription drugs, or dietary supplements you use. Also tell them if you smoke, drink alcohol, or use illegal drugs. Some items may interact with your medicine. What should I watch for while using this medication? Visit your health care provider for regular checks on your progress. You may need blood work done while you are taking this medicine. Your condition will be monitored carefully while you are receiving this medicine. It is important not to miss any appointments. What side effects may I notice from receiving this medication? Side effects that you should report to your doctor or health care professional as soon as possible: allergic reactions (skin rash, itching or hives; swelling of the face, lips, or tongue) bleeding (bloody or black, tarry stools; red or dark brown urine; spitting up blood or brown material that looks like coffee grounds; red spots on the skin;  unusual bruising or bleeding from the eyes, gums, or nose) blood clot (chest pain; shortness of breath; pain, swelling, or warmth in the leg) stroke (changes in vision; confusion; trouble speaking or understanding; severe headaches; sudden numbness or weakness of the face, arm or leg; trouble walking; dizziness; loss of balance or coordination) Side effects that usually do not require medical attention (report to your doctor or health care professional if they continue or are bothersome): diarrhea dizziness headache joint pain muscle pain stomach  pain trouble sleeping This list may not describe all possible side effects. Call your doctor for medical advice about side effects. You may report side effects to FDA at 1-800-FDA-1088. Where should I keep my medication? This medicine is given in a hospital or clinic. It will not be stored at home. NOTE: This sheet is a summary. It may not cover all possible information. If you have questions about this medicine, talk to your doctor, pharmacist, or health care provider.  2022 Elsevier/Gold Standard (2021-07-21 00:00:00)

## 2021-09-22 NOTE — Progress Notes (Signed)
[name] OFFICE PROGRESS NOTE   Diagnosis: Pancreas cancer  INTERVAL HISTORY:   Aaron Johnston was admitted with GI bleeding 09/15/2021.  He underwent an upper endoscopy on 09/17/2021 and bleeding esophageal varices were noted.  The varices were banded.  He denies recurrent bleeding.  He was discharged to the hospital 09/18/2021. He continues to have intermittent diarrhea.  No other complaint.  Objective:  Vital signs in last 24 hours:  Blood pressure 111/72, pulse 74, temperature 98.1 F (36.7 C), resp. rate 20, height _0  (1.727 m), weight 136 lb 6.4 oz (61.9 kg), SpO2 100 %.    HEENT: No thrush or ulcers Resp: Lungs clear bilaterally Cardio: Regular rate and rhythm GI: No hepatosplenomegaly, no mass, nontender Vascular: No leg edema  Portacath/PICC-without erythema  Lab Results:  Lab Results  Component Value Date   WBC 7.4 09/22/2021   HGB 9.7 (L) 09/22/2021   HCT 31.3 (L) 09/22/2021   MCV 95.1 09/22/2021   PLT 101 (L) 09/22/2021   NEUTROABS 6.0 09/22/2021    CMP  Lab Results  Component Value Date   NA 141 09/22/2021   K 4.0 09/22/2021   CL 116 (H) 09/22/2021   CO2 17 (L) 09/22/2021   GLUCOSE 180 (H) 09/22/2021   BUN 11 09/22/2021   CREATININE 1.23 09/22/2021   CALCIUM 8.2 (L) 09/22/2021   PROT 5.9 (L) 09/22/2021   ALBUMIN 3.5 09/22/2021   AST 23 09/22/2021   ALT 31 09/22/2021   ALKPHOS 241 (H) 09/22/2021   BILITOT 0.9 09/22/2021   GFRNONAA >60 09/22/2021   GFRAA NOT CALCULATED 02/23/2021    Lab Results  Component Value Date   STM196 3,411 (H) 08/20/2021     Medications: I have reviewed the patient's current medications.   Assessment/Plan: Pancreas cancer CT urology center 03/04/2020-haziness of the fat adjacent to the celiac axis and SMA with focal narrowing of the SMV, splenic vein, and splenoportal confluence CT 07/07/2020-infiltrative retroperitoneal mass with encasement of the celiac axis and SMA, high-grade narrowing of the  proximal portal vein with cavernous transformation, nonocclusive thrombus within the main portal vein, mild biliary ductal dilatation, diffuse hypodense/hypoenhancing areas within the liver (edema versus infiltrating neoplasm), 2 x 1.7 cm area of focal prominence in the pancreas EUS 07/10/2020-no pancreas mass identified, 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, 1.5 cm aortocaval node biopsy-scant lymphoid material, no malignancy Diagnostic laparoscopy 07/17/2020-segment 3 and 4 liver nodules, adenocarcinoma, positive for pankeratin, CK7 and CK20, partially positive for TTF-1, negative for CDX2 Foundation 1-microsatellite stable, tumor mutation burden 4, K-ras G12D, subclonal RB1 alteration Elevated CA 19-9 Cycle 1 FOLFOX 07/28/2020 Cycle 2 FOLFOX 08/18/2020, oxaliplatin dose reduced due to thrombocytopenia Cycle 3 FOLFOX 09/08/2020 Cycle 4 FOLFOX 09/29/2020  Cycle 5 FOLFOX 10/20/2020 CTs 11/06/2020-grossly stable infiltrative mass within the pancreatic head and porta hepatis.  New small low-density liver lesions.  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 Cycle 6 FOLFOX 11/10/2021  Cycle 7 FOLFOX 12/01/2020 MRI liver 12/04/2020-no significant change in posttreatment appearance of the pancreatic head.  Numerous intrinsically low signal hypoenhancing lesions of the liver parenchyma predominantly observed in the right lobe of the liver and liver dome, measuring no greater than 8 mm and as seen on recent prior CT of the abdomen/pelvis. Cycle 8 FOLFOX 12/22/2020 Cycle 9 FOLFOX 01/12/2021 Cycle 10 FOLFOX 02/02/2021 MRI liver 02/19/2021-no change in pancreas head mass, multiple hypoenhancing  liver lesions are new and increased in size, effacement of portal vein by pancreas head mass with cavernous transformation, no change in mild splenomegaly Cycle 1 gemcitabine/Abraxane  03/12/2021 Cycle 2 gemcitabine/Abraxane 03/27/2021- dose reductions secondary to neutropenia Cycle 3 gemcitabine/Abraxane 04/17/2021-dose reductions due to thrombocytopenia, white cell growth factor support added Cycle 4 gemcitabine/Abraxane 05/01/2021, Ziextenzo Cycle 5 gemcitabine/Abraxane 05/15/2021, Ziextenzo MRI abdomen 05/26/2021-mild enlargement of dominant hepatic metastases, appearance of infiltrative pancreas mass extending to the celiac trunk and SMA, splenic vein occlusion, cavernous transformation of the portal vein with upper abdomen collaterals, splenomegaly, mild upper abdominal ascites and mesenteric edema, minimal subpleural nodularity left lower lobe Cycle 6 gemcitabine/Abraxane 05/28/2021 Cycle 7 gemcitabine/Abraxane 06/12/2021 Cycle 8 gemcitabine/Abraxane 06/26/2021 Cycle 9 gemcitabine/Abraxane 07/10/2021 Cycle 10 gemcitabine/Abraxane 07/23/2021 Cycle 11 gemcitabine/Abraxane 08/06/2021 MRI abdomen 08/20/2021-multifocal nodularity in the lower chest with a new discrete right lower lobe nodule, enlarging subsegment 7/8 liver lesion, other small lesions appear stable, gastric varices, review of images with radiology-enlargement of at least 2 liver lesions, suspicious nodule n the right lower lung Cycle 1  5-FU/liposomal irinotecan 09/08/2021 Cycle 2  5-FU/liposomal irinotecan 09/22/2021   Chronic thrombocytopenia- weekly Nplate beginning 6/43/8377 Diabetes Hypertension Abdomen/back pain 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   Admission 08/27/2021 with GI bleeding Upper endoscopy 08/27/2021-grade 1 esophageal varices, gastric varices without bleeding CT angiogram abdomen/pelvis 08/27/2021-extensive paraesophageal and gastric varices with chronic portal venous occlusion Colonoscopy 08/29/2021- inflamed, friable colonic mucosa with contact  bleeding 9.   Admission with recurrent GI bleeding and severe anemia 09/15/2021 Upper endoscopy 09/17/2021-esophageal varices with stigmata of recent bleeding, banded.  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  The hemoglobin is higher following discharge from the hospital.  He will complete a second cycle of 5-FU/liposomal irinotecan today.  He will receive Nplate and G-CSF support following chemotherapy.  Aaron Johnston will continue weekly Nplate.  He will return for an office visit in the next cycle of chemotherapy on 10/13/2021.  Betsy Coder, MD  09/22/2021  9:22 AM

## 2021-09-22 NOTE — Progress Notes (Signed)
Patient seen by Dr. Benay Spice today  Vitals are within treatment parameters.  Labs reviewed by Dr. Benay Spice and are within treatment parameters.  Per physician team, patient is ready for treatment and there are NO modifications to the treatment plan.   Provider aware of lab results no changes voiced ok for treatment

## 2021-09-22 NOTE — Patient Instructions (Signed)

## 2021-09-22 NOTE — Progress Notes (Signed)
Patient presents for treatment. RN assessment completed along with the following:  Labs/vitals reviewed - Yes, and within treatment parameters.   Weight within 10% of previous measurement - Yes Oncology Treatment Attestation completed for current therapy- Yes, on date 09/01/21 Informed consent completed and reflects current therapy/intent - Yes, on date 09/08/21             Provider progress note reviewed - Today's provider note is not yet available. I reviewed the most recent oncology provider progress note in chart dated 09/16/21. Treatment/Antibody/Supportive plan reviewed - Yes, and there are no adjustments needed for today's treatment. S&H and other orders reviewed - Yes, and there are no additional orders identified. Previous treatment date reviewed - Yes, and the appropriate amount of time has elapsed between treatments. Clinic Hand Off Received from - none  Patient to proceed with treatment.

## 2021-09-24 ENCOUNTER — Other Ambulatory Visit: Payer: Self-pay

## 2021-09-24 ENCOUNTER — Inpatient Hospital Stay: Payer: Medicare Other

## 2021-09-24 VITALS — BP 111/59 | HR 72 | Temp 97.7°F | Resp 18

## 2021-09-24 DIAGNOSIS — C250 Malignant neoplasm of head of pancreas: Secondary | ICD-10-CM

## 2021-09-24 MED ORDER — PEGFILGRASTIM-BMEZ 6 MG/0.6ML ~~LOC~~ SOSY
6.0000 mg | PREFILLED_SYRINGE | Freq: Once | SUBCUTANEOUS | Status: AC
  Administered 2021-09-24: 6 mg via SUBCUTANEOUS

## 2021-09-24 MED ORDER — HEPARIN SOD (PORK) LOCK FLUSH 100 UNIT/ML IV SOLN
500.0000 [IU] | Freq: Once | INTRAVENOUS | Status: AC | PRN
  Administered 2021-09-24: 500 [IU]

## 2021-09-24 MED ORDER — SODIUM CHLORIDE 0.9% FLUSH
10.0000 mL | INTRAVENOUS | Status: DC | PRN
  Administered 2021-09-24: 10 mL

## 2021-09-24 NOTE — Patient Instructions (Signed)

## 2021-09-29 ENCOUNTER — Encounter: Payer: Self-pay | Admitting: Oncology

## 2021-09-29 ENCOUNTER — Inpatient Hospital Stay: Payer: Medicare Other

## 2021-09-29 ENCOUNTER — Other Ambulatory Visit: Payer: Self-pay

## 2021-09-29 ENCOUNTER — Encounter: Payer: Self-pay | Admitting: Nurse Practitioner

## 2021-09-29 VITALS — BP 96/60 | HR 69 | Temp 98.2°F | Resp 18

## 2021-09-29 DIAGNOSIS — C250 Malignant neoplasm of head of pancreas: Secondary | ICD-10-CM

## 2021-09-29 DIAGNOSIS — D696 Thrombocytopenia, unspecified: Secondary | ICD-10-CM

## 2021-09-29 DIAGNOSIS — Z95828 Presence of other vascular implants and grafts: Secondary | ICD-10-CM

## 2021-09-29 LAB — CBC WITH DIFFERENTIAL (CANCER CENTER ONLY)
Abs Immature Granulocytes: 0.41 10*3/uL — ABNORMAL HIGH (ref 0.00–0.07)
Basophils Absolute: 0.1 10*3/uL (ref 0.0–0.1)
Basophils Relative: 1 %
Eosinophils Absolute: 0.1 10*3/uL (ref 0.0–0.5)
Eosinophils Relative: 1 %
HCT: 25.3 % — ABNORMAL LOW (ref 39.0–52.0)
Hemoglobin: 7.9 g/dL — ABNORMAL LOW (ref 13.0–17.0)
Immature Granulocytes: 6 %
Lymphocytes Relative: 6 %
Lymphs Abs: 0.4 10*3/uL — ABNORMAL LOW (ref 0.7–4.0)
MCH: 29.4 pg (ref 26.0–34.0)
MCHC: 31.2 g/dL (ref 30.0–36.0)
MCV: 94.1 fL (ref 80.0–100.0)
Monocytes Absolute: 0.2 10*3/uL (ref 0.1–1.0)
Monocytes Relative: 3 %
Neutro Abs: 5.2 10*3/uL (ref 1.7–7.7)
Neutrophils Relative %: 83 %
Platelet Count: 121 10*3/uL — ABNORMAL LOW (ref 150–400)
RBC: 2.69 MIL/uL — ABNORMAL LOW (ref 4.22–5.81)
RDW: 18.2 % — ABNORMAL HIGH (ref 11.5–15.5)
WBC Count: 6.4 10*3/uL (ref 4.0–10.5)
nRBC: 0 % (ref 0.0–0.2)

## 2021-09-29 LAB — PREPARE RBC (CROSSMATCH)

## 2021-09-29 MED ORDER — ROMIPLOSTIM 125 MCG ~~LOC~~ SOLR
2.0000 ug/kg | Freq: Once | SUBCUTANEOUS | Status: AC
  Administered 2021-09-29: 125 ug via SUBCUTANEOUS
  Filled 2021-09-29: qty 0.25

## 2021-09-29 NOTE — Patient Instructions (Signed)
Romiplostim injection ?What is this medication? ?ROMIPLOSTIM (roe mi PLOE stim) helps your body make more platelets. This medicine is used to treat low platelets caused by chronic idiopathic thrombocytopenic purpura (ITP) or a bone marrow syndrome caused by radiation sickness. ?This medicine may be used for other purposes; ask your health care provider or pharmacist if you have questions. ?COMMON BRAND NAME(S): Nplate ?What should I tell my care team before I take this medication? ?They need to know if you have any of these conditions: ?blood clots ?myelodysplastic syndrome ?an unusual or allergic reaction to romiplostim, mannitol, other medicines, foods, dyes, or preservatives ?pregnant or trying to get pregnant ?breast-feeding ?How should I use this medication? ?This medicine is injected under the skin. It is given by a health care provider in a hospital or clinic setting. ?A special MedGuide will be given to you before each treatment. Be sure to read this information carefully each time. ?Talk to your health care provider about the use of this medicine in children. While it may be prescribed for children as young as newborns for selected conditions, precautions do apply. ?Overdosage: If you think you have taken too much of this medicine contact a poison control center or emergency room at once. ?NOTE: This medicine is only for you. Do not share this medicine with others. ?What if I miss a dose? ?Keep appointments for follow-up doses. It is important not to miss your dose. Call your health care provider if you are unable to keep an appointment. ?What may interact with this medication? ?Interactions are not expected. ?This list may not describe all possible interactions. Give your health care provider a list of all the medicines, herbs, non-prescription drugs, or dietary supplements you use. Also tell them if you smoke, drink alcohol, or use illegal drugs. Some items may interact with your medicine. ?What should I  watch for while using this medication? ?Visit your health care provider for regular checks on your progress. You may need blood work done while you are taking this medicine. Your condition will be monitored carefully while you are receiving this medicine. It is important not to miss any appointments. ?What side effects may I notice from receiving this medication? ?Side effects that you should report to your doctor or health care professional as soon as possible: ?allergic reactions (skin rash, itching or hives; swelling of the face, lips, or tongue) ?bleeding (bloody or black, tarry stools; red or dark brown urine; spitting up blood or brown material that looks like coffee grounds; red spots on the skin; unusual bruising or bleeding from the eyes, gums, or nose) ?blood clot (chest pain; shortness of breath; pain, swelling, or warmth in the leg) ?stroke (changes in vision; confusion; trouble speaking or understanding; severe headaches; sudden numbness or weakness of the face, arm or leg; trouble walking; dizziness; loss of balance or coordination) ?Side effects that usually do not require medical attention (report to your doctor or health care professional if they continue or are bothersome): ?diarrhea ?dizziness ?headache ?joint pain ?muscle pain ?stomach pain ?trouble sleeping ?This list may not describe all possible side effects. Call your doctor for medical advice about side effects. You may report side effects to FDA at 1-800-FDA-1088. ?Where should I keep my medication? ?This medicine is given in a hospital or clinic. It will not be stored at home. ?NOTE: This sheet is a summary. It may not cover all possible information. If you have questions about this medicine, talk to your doctor, pharmacist, or health care provider. ??   2022 Elsevier/Gold Standard (2021-07-21 00:00:00) ? ?

## 2021-09-30 ENCOUNTER — Telehealth: Payer: Self-pay

## 2021-09-30 ENCOUNTER — Other Ambulatory Visit: Payer: Self-pay

## 2021-09-30 DIAGNOSIS — D696 Thrombocytopenia, unspecified: Secondary | ICD-10-CM

## 2021-09-30 NOTE — Telephone Encounter (Signed)
Pt scheduled for two units of blood 10/01/21

## 2021-09-30 NOTE — Telephone Encounter (Signed)
-----   Message from Ladell Pier, MD sent at 09/29/2021  6:40 PM EST ----- Please call patient, hb is lower, does he have bleeding?, symptoms of anemia, f/u 1 week for cbc as scheduled

## 2021-10-01 ENCOUNTER — Inpatient Hospital Stay: Payer: Medicare Other

## 2021-10-01 ENCOUNTER — Other Ambulatory Visit: Payer: Self-pay

## 2021-10-01 DIAGNOSIS — C250 Malignant neoplasm of head of pancreas: Secondary | ICD-10-CM

## 2021-10-01 DIAGNOSIS — Z95828 Presence of other vascular implants and grafts: Secondary | ICD-10-CM

## 2021-10-01 MED ORDER — SODIUM CHLORIDE 0.9% FLUSH
10.0000 mL | INTRAVENOUS | Status: AC | PRN
  Administered 2021-10-01: 13:00:00 10 mL

## 2021-10-01 MED ORDER — HEPARIN SOD (PORK) LOCK FLUSH 100 UNIT/ML IV SOLN
500.0000 [IU] | Freq: Every day | INTRAVENOUS | Status: AC | PRN
  Administered 2021-10-01: 13:00:00 500 [IU]

## 2021-10-01 MED ORDER — SODIUM CHLORIDE 0.9% IV SOLUTION
250.0000 mL | Freq: Once | INTRAVENOUS | Status: AC
  Administered 2021-10-01: 08:00:00 250 mL via INTRAVENOUS

## 2021-10-01 NOTE — Patient Instructions (Signed)
[facility]   Discharge Instructions: Thank you for choosing [facility] to provide your oncology and hematology care.   If you have a lab appointment with the Cancer Center, please go directly to the Cancer Center and check in at the registration area.   Wear comfortable clothing and clothing appropriate for easy access to any Portacath or PICC line.   We strive to give you quality time with your provider. You may need to reschedule your appointment if you arrive late (15 or more minutes).  Arriving late affects you and other patients whose appointments are after yours.  Also, if you miss three or more appointments without notifying the office, you may be dismissed from the clinic at the provider's discretion.      For prescription refill requests, have your pharmacy contact our office and allow 72 hours for refills to be completed.    Today you received the following Blood Transfusion   To help prevent nausea and vomiting after your treatment, we encourage you to take your nausea medication as directed.  BELOW ARE SYMPTOMS THAT SHOULD BE REPORTED IMMEDIATELY: *FEVER GREATER THAN 100.4 F (38 C) OR HIGHER *CHILLS OR SWEATING *NAUSEA AND VOMITING THAT IS NOT CONTROLLED WITH YOUR NAUSEA MEDICATION *UNUSUAL SHORTNESS OF BREATH *UNUSUAL BRUISING OR BLEEDING *URINARY PROBLEMS (pain or burning when urinating, or frequent urination) *BOWEL PROBLEMS (unusual diarrhea, constipation, pain near the anus) TENDERNESS IN MOUTH AND THROAT WITH OR WITHOUT PRESENCE OF ULCERS (sore throat, sores in mouth, or a toothache) UNUSUAL RASH, SWELLING OR PAIN  UNUSUAL VAGINAL DISCHARGE OR ITCHING   Items with * indicate a potential emergency and should be followed up as soon as possible or go to the Emergency Department if any problems should occur.  Please show the CHEMOTHERAPY ALERT CARD or IMMUNOTHERAPY ALERT CARD at check-in to the Emergency Department and triage  nurse.  Should you have questions after your visit or need to cancel or reschedule your appointment, please contact [facility]  Dept: [phone]  and follow the prompts.  Office hours are 8:00 a.m. to 4:30 p.m. Monday - Friday. Please note that voicemails left after 4:00 p.m. may not be returned until the following business day.  We are closed weekends and major holidays. You have access to a nurse at all times for urgent questions. Please call the main number to the clinic Dept: [phone] and follow the prompts.   For any non-urgent questions, you may also contact your provider using MyChart. We now offer e-Visits for anyone 18 and older to request care online for non-urgent symptoms. For details visit [email or web address].   Also download the MyChart app! Go to the app store, search "MyChart", open the app, select [facility], and log in with your MyChart username and password.  Due to Covid, a mask is required upon entering the hospital/clinic. If you do not have a mask, one will be given to you upon arrival. For doctor visits, patients may have 1 support person aged 18 or older with them. For treatment visits, patients cannot have anyone with them due to current Covid guidelines and our immunocompromised population.   Blood Transfusion, Adult, Care After This sheet gives you information about how to care for yourself after your procedure. Your doctor may also give you more specific instructions. If you have problems or questions, contact your doctor. What can I expect after the procedure? After the procedure, it is common to have: Bruising and   soreness at the IV site. A headache. Follow these instructions at home: Insertion site care   Follow instructions from your doctor about how to take care of your insertion site. This is where an IV tube was put into your vein. Make sure you: Wash your hands with soap and water before and after you change your bandage  (dressing). If you cannot use soap and water, use hand sanitizer. Change your bandage as told by your doctor. Check your insertion site every day for signs of infection. Check for: Redness, swelling, or pain. Bleeding from the site. Warmth. Pus or a bad smell. General instructions Take over-the-counter and prescription medicines only as told by your doctor. Rest as told by your doctor. Go back to your normal activities as told by your doctor. Keep all follow-up visits as told by your doctor. This is important. Contact a doctor if: You have itching or red, swollen areas of skin (hives). You feel worried or nervous (anxious). You feel weak after doing your normal activities. You have redness, swelling, warmth, or pain around the insertion site. You have blood coming from the insertion site, and the blood does not stop with pressure. You have pus or a bad smell coming from the insertion site. Get help right away if: You have signs of a serious reaction. This may be coming from an allergy or the body's defense system (immune system). Signs include: Trouble breathing or shortness of breath. Swelling of the face or feeling warm (flushed). Fever or chills. Head, chest, or back pain. Dark pee (urine) or blood in the pee. Widespread rash. Fast heartbeat. Feeling dizzy or light-headed. You may receive your blood transfusion in an outpatient setting. If so, you will be told whom to contact to report any reactions. These symptoms may be an emergency. Do not wait to see if the symptoms will go away. Get medical help right away. Call your local emergency services (911 in the [place] Do not drive yourself to the hospital. Summary Bruising and soreness at the IV site are common. Check your insertion site every day for signs of infection. Rest as told by your doctor. Go back to your normal activities as told by your doctor. Get help right away if you have signs of a serious reaction. This  information is not intended to replace advice given to you by your health care provider. Make sure you discuss any questions you have with your health care provider. Document Revised: 02/26/2021 Document Reviewed: 04/26/2019 Elsevier Patient Education  2022 [employer]  

## 2021-10-02 LAB — TYPE AND SCREEN
ABO/RH(D): A POS
Antibody Screen: NEGATIVE
Unit division: 0
Unit division: 0

## 2021-10-02 LAB — BPAM RBC
Blood Product Expiration Date: 202212062359
Blood Product Expiration Date: 202212062359
ISSUE DATE / TIME: 202211170732
ISSUE DATE / TIME: 202211170732
Unit Type and Rh: 6200
Unit Type and Rh: 6200

## 2021-10-06 ENCOUNTER — Inpatient Hospital Stay: Payer: Medicare Other

## 2021-10-06 ENCOUNTER — Other Ambulatory Visit: Payer: Self-pay

## 2021-10-06 VITALS — BP 111/67 | HR 67 | Temp 97.7°F | Resp 18 | Wt 126.6 lb

## 2021-10-06 DIAGNOSIS — C250 Malignant neoplasm of head of pancreas: Secondary | ICD-10-CM

## 2021-10-06 DIAGNOSIS — D696 Thrombocytopenia, unspecified: Secondary | ICD-10-CM

## 2021-10-06 DIAGNOSIS — Z95828 Presence of other vascular implants and grafts: Secondary | ICD-10-CM

## 2021-10-06 LAB — CBC WITH DIFFERENTIAL (CANCER CENTER ONLY)
Abs Immature Granulocytes: 0.08 10*3/uL — ABNORMAL HIGH (ref 0.00–0.07)
Basophils Absolute: 0.1 10*3/uL (ref 0.0–0.1)
Basophils Relative: 1 %
Eosinophils Absolute: 0.1 10*3/uL (ref 0.0–0.5)
Eosinophils Relative: 1 %
HCT: 32.8 % — ABNORMAL LOW (ref 39.0–52.0)
Hemoglobin: 10.6 g/dL — ABNORMAL LOW (ref 13.0–17.0)
Immature Granulocytes: 1 %
Lymphocytes Relative: 8 %
Lymphs Abs: 0.7 10*3/uL (ref 0.7–4.0)
MCH: 30 pg (ref 26.0–34.0)
MCHC: 32.3 g/dL (ref 30.0–36.0)
MCV: 92.9 fL (ref 80.0–100.0)
Monocytes Absolute: 0.3 10*3/uL (ref 0.1–1.0)
Monocytes Relative: 4 %
Neutro Abs: 7.5 10*3/uL (ref 1.7–7.7)
Neutrophils Relative %: 85 %
Platelet Count: 142 10*3/uL — ABNORMAL LOW (ref 150–400)
RBC: 3.53 MIL/uL — ABNORMAL LOW (ref 4.22–5.81)
RDW: 18.7 % — ABNORMAL HIGH (ref 11.5–15.5)
WBC Count: 8.7 10*3/uL (ref 4.0–10.5)
nRBC: 0 % (ref 0.0–0.2)

## 2021-10-06 LAB — SAMPLE TO BLOOD BANK

## 2021-10-06 MED ORDER — ROMIPLOSTIM 125 MCG ~~LOC~~ SOLR
2.0000 ug/kg | Freq: Once | SUBCUTANEOUS | Status: AC
  Administered 2021-10-06: 115 ug via SUBCUTANEOUS
  Filled 2021-10-06: qty 0.23

## 2021-10-06 MED ORDER — HEPARIN SOD (PORK) LOCK FLUSH 100 UNIT/ML IV SOLN
500.0000 [IU] | Freq: Once | INTRAVENOUS | Status: AC
  Administered 2021-10-06: 500 [IU] via INTRAVENOUS

## 2021-10-06 MED ORDER — SODIUM CHLORIDE 0.9% FLUSH
10.0000 mL | Freq: Once | INTRAVENOUS | Status: AC
  Administered 2021-10-06: 10 mL via INTRAVENOUS

## 2021-10-06 NOTE — Patient Instructions (Signed)
[facility]  Discharge Instructions: Thank you for choosing [facility] to provide your oncology and hematology care.   If you have a lab appointment with the [facility], please go directly to the [facility] and check in at the registration area.   Wear comfortable clothing and clothing appropriate for easy access to any Portacath or PICC line.   We strive to give you quality time with your provider. You may need to reschedule your appointment if you arrive late (15 or more minutes).  Arriving late affects you and other patients whose appointments are after yours.  Also, if you miss three or more appointments without notifying the office, you may be dismissed from the clinic at the provider's discretion.      For prescription refill requests, have your pharmacy contact our office and allow 72 hours for refills to be completed.    Today you received the following chemotherapy and/or immunotherapy agents n-Plate      To help prevent nausea and vomiting after your treatment, we encourage you to take your nausea medication as directed.  BELOW ARE SYMPTOMS THAT SHOULD BE REPORTED IMMEDIATELY: *FEVER GREATER THAN 100.4 F (38 C) OR HIGHER *CHILLS OR SWEATING *NAUSEA AND VOMITING THAT IS NOT CONTROLLED WITH YOUR NAUSEA MEDICATION *UNUSUAL SHORTNESS OF BREATH *UNUSUAL BRUISING OR BLEEDING *URINARY PROBLEMS (pain or burning when urinating, or frequent urination) *BOWEL PROBLEMS (unusual diarrhea, constipation, pain near the anus) TENDERNESS IN MOUTH AND THROAT WITH OR WITHOUT PRESENCE OF ULCERS (sore throat, sores in mouth, or a toothache) UNUSUAL RASH, SWELLING OR PAIN  UNUSUAL VAGINAL DISCHARGE OR ITCHING   Items with * indicate a potential emergency and should be followed up as soon as possible or go to the Emergency Department if any problems should occur.  Please show the CHEMOTHERAPY ALERT CARD or IMMUNOTHERAPY ALERT CARD at check-in to the  Emergency Department and triage nurse.  Should you have questions after your visit or need to cancel or reschedule your appointment, please contact [facility]  Dept: [phone]  and follow the prompts.  Office hours are 8:00 a.m. to 4:30 p.m. Monday - Friday. Please note that voicemails left after 4:00 p.m. may not be returned until the following business day.  We are closed weekends and major holidays. You have access to a nurse at all times for urgent questions. Please call the main number to the clinic Dept: [phone] and follow the prompts.   For any non-urgent questions, you may also contact your provider using MyChart. We now offer e-Visits for anyone 37 and older to request care online for non-urgent symptoms. For details visit [email or web address].   Also download the MyChart app! Go to the app store, search "MyChart", open the app, select [facility], and log in with your MyChart username and password.  Due to Covid, a mask is required upon entering the hospital/clinic. If you do not have a mask, one will be given to you upon arrival. For doctor visits, patients may have 1 support person aged 70 or older with them. For treatment visits, patients cannot have anyone with them due to current Covid guidelines and our immunocompromised population.   Romiplostim injection What is this medication? ROMIPLOSTIM (roe mi PLOE stim) helps your body make more platelets. This medicine is used to treat low platelets caused by chronic idiopathic thrombocytopenic purpura (ITP) or a bone marrow syndrome caused by radiation sickness. This medicine may be used for other purposes;  ask your health care provider or pharmacist if you have questions. COMMON BRAND NAME(S): Nplate What should I tell my care team before I take this medication? They need to know if you have any of these conditions: blood clots myelodysplastic syndrome an unusual or allergic reaction to  romiplostim, mannitol, other medicines, foods, dyes, or preservatives pregnant or trying to get pregnant breast-feeding How should I use this medication? This medicine is injected under the skin. It is given by a health care provider in a hospital or clinic setting. A special MedGuide will be given to you before each treatment. Be sure to read this information carefully each time. Talk to your health care provider about the use of this medicine in children. While it may be prescribed for children as young as newborns for selected conditions, precautions do apply. Overdosage: If you think you have taken too much of this medicine contact a poison control center or emergency room at once. NOTE: This medicine is only for you. Do not share this medicine with others. What if I miss a dose? Keep appointments for follow-up doses. It is important not to miss your dose. Call your health care provider if you are unable to keep an appointment. What may interact with this medication? Interactions are not expected. This list may not describe all possible interactions. Give your health care provider a list of all the medicines, herbs, non-prescription drugs, or dietary supplements you use. Also tell them if you smoke, drink alcohol, or use illegal drugs. Some items may interact with your medicine. What should I watch for while using this medication? Visit your health care provider for regular checks on your progress. You may need blood work done while you are taking this medicine. Your condition will be monitored carefully while you are receiving this medicine. It is important not to miss any appointments. What side effects may I notice from receiving this medication? Side effects that you should report to your doctor or health care professional as soon as possible: allergic reactions (skin rash, itching or hives; swelling of the face, lips, or tongue) bleeding (bloody or black, tarry stools; red or dark brown  urine; spitting up blood or brown material that looks like coffee grounds; red spots on the skin; unusual bruising or bleeding from the eyes, gums, or nose) blood clot (chest pain; shortness of breath; pain, swelling, or warmth in the leg) stroke (changes in vision; confusion; trouble speaking or understanding; severe headaches; sudden numbness or weakness of the face, arm or leg; trouble walking; dizziness; loss of balance or coordination) Side effects that usually do not require medical attention (report to your doctor or health care professional if they continue or are bothersome): diarrhea dizziness headache joint pain muscle pain stomach pain trouble sleeping This list may not describe all possible side effects. Call your doctor for medical advice about side effects. You may report side effects to FDA at 1-800-FDA-1088. Where should I keep my medication? This medicine is given in a hospital or clinic. It will not be stored at home. NOTE: This sheet is a summary. It may not cover all possible information. If you have questions about this medicine, talk to your doctor, pharmacist, or health care provider.  2022 Elsevier/Gold Standard (2021-07-21 00:00:00)

## 2021-10-07 ENCOUNTER — Inpatient Hospital Stay: Payer: Medicare Other | Admitting: Nutrition

## 2021-10-07 ENCOUNTER — Other Ambulatory Visit: Payer: Self-pay | Admitting: Oncology

## 2021-10-07 DIAGNOSIS — C250 Malignant neoplasm of head of pancreas: Secondary | ICD-10-CM

## 2021-10-07 NOTE — Progress Notes (Signed)
Nutrition follow up completed with patient receiving 5-FU/liposomal irinotecan for pancreas cancer beginning on 09/08/21.  Weight documented as 126.4 pounds today, decreased from 136.4 pounds on Nov 8. This is 7% wt loss in 2 weeks.  Labs reviewed.  Patient reports a good appetite. States food and drinks "go through" him within 10-30 min after eating or drinking. He describes these as watery stools 2-3 times daily. This is not new with new chemo regimen. He takes Creon after he eats. He has not really enjoyed nutrition supplements, many are too sweet for him. Reports occasional nausea improved with antiemetics. Reports he thought Enterade helped him but someone told him it wasn't worth the money. Nutrition focused physical exam reveals severe muscle loss and fat loss. Patient complains of fatigue.  Nutrition Diagnosis: Severe Malnutrition in the context of chronic illness related to % wt loss and severe depletion of muscle and fat on physical exam.  Intervention: Educated on importance of low fiber diet. Provided nutrition fact sheet. Encouraged white rice TID with meals. Educated on taking Creon with first bite of food. Add banana to lactaid milk or Fairlife milk and make a smoothie. Questions answered and teach back used. Contact information given.  Monitoring, Evaluation, Goals: Patient will increase calories and protein to promote repletion.  Next Visit: Patient prefers to contact RD for questions.

## 2021-10-11 ENCOUNTER — Other Ambulatory Visit: Payer: Self-pay | Admitting: Oncology

## 2021-10-13 ENCOUNTER — Emergency Department (HOSPITAL_BASED_OUTPATIENT_CLINIC_OR_DEPARTMENT_OTHER): Payer: Medicare Other

## 2021-10-13 ENCOUNTER — Encounter (HOSPITAL_BASED_OUTPATIENT_CLINIC_OR_DEPARTMENT_OTHER): Payer: Self-pay | Admitting: Emergency Medicine

## 2021-10-13 ENCOUNTER — Emergency Department (HOSPITAL_BASED_OUTPATIENT_CLINIC_OR_DEPARTMENT_OTHER)
Admission: EM | Admit: 2021-10-13 | Discharge: 2021-10-13 | Disposition: A | Discharge status: Home / Self Care | Payer: Medicare Other | Source: Home / Self Care | Attending: Emergency Medicine | Admitting: Emergency Medicine

## 2021-10-13 ENCOUNTER — Inpatient Hospital Stay: Payer: Medicare Other

## 2021-10-13 ENCOUNTER — Telehealth: Payer: Self-pay | Admitting: Surgery

## 2021-10-13 ENCOUNTER — Other Ambulatory Visit: Payer: Self-pay

## 2021-10-13 ENCOUNTER — Inpatient Hospital Stay (HOSPITAL_BASED_OUTPATIENT_CLINIC_OR_DEPARTMENT_OTHER): Payer: Medicare Other | Admitting: Nurse Practitioner

## 2021-10-13 ENCOUNTER — Encounter: Payer: Self-pay | Admitting: Nurse Practitioner

## 2021-10-13 VITALS — BP 124/65 | HR 100 | Temp 100.2°F | Resp 18 | Ht 68.0 in | Wt 121.4 lb

## 2021-10-13 DIAGNOSIS — K922 Gastrointestinal hemorrhage, unspecified: Secondary | ICD-10-CM | POA: Insufficient documentation

## 2021-10-13 DIAGNOSIS — Z87891 Personal history of nicotine dependence: Secondary | ICD-10-CM | POA: Insufficient documentation

## 2021-10-13 DIAGNOSIS — J101 Influenza due to other identified influenza virus with other respiratory manifestations: Secondary | ICD-10-CM | POA: Insufficient documentation

## 2021-10-13 DIAGNOSIS — E119 Type 2 diabetes mellitus without complications: Secondary | ICD-10-CM | POA: Insufficient documentation

## 2021-10-13 DIAGNOSIS — Z20822 Contact with and (suspected) exposure to covid-19: Secondary | ICD-10-CM | POA: Insufficient documentation

## 2021-10-13 DIAGNOSIS — C250 Malignant neoplasm of head of pancreas: Secondary | ICD-10-CM | POA: Diagnosis not present

## 2021-10-13 LAB — CMP (CANCER CENTER ONLY)
ALT: 49 U/L — ABNORMAL HIGH (ref 0–44)
AST: 40 U/L (ref 15–41)
Albumin: 4 g/dL (ref 3.5–5.0)
Alkaline Phosphatase: 291 U/L — ABNORMAL HIGH (ref 38–126)
Anion gap: 11 (ref 5–15)
BUN: 16 mg/dL (ref 8–23)
CO2: 15 mmol/L — ABNORMAL LOW (ref 22–32)
Calcium: 9.2 mg/dL (ref 8.9–10.3)
Chloride: 109 mmol/L (ref 98–111)
Creatinine: 1.29 mg/dL — ABNORMAL HIGH (ref 0.61–1.24)
GFR, Estimated: 59 mL/min — ABNORMAL LOW (ref >60)
Glucose, Bld: 216 mg/dL — ABNORMAL HIGH (ref 70–99)
Potassium: 4.4 mmol/L (ref 3.5–5.1)
Sodium: 135 mmol/L (ref 135–145)
Total Bilirubin: 0.9 mg/dL (ref 0.3–1.2)
Total Protein: 6.7 g/dL (ref 6.5–8.1)

## 2021-10-13 LAB — CBC WITH DIFFERENTIAL (CANCER CENTER ONLY)
Abs Immature Granulocytes: 0.12 10*3/uL — ABNORMAL HIGH (ref 0.00–0.07)
Basophils Absolute: 0.1 10*3/uL (ref 0.0–0.1)
Basophils Relative: 0 %
Eosinophils Absolute: 0 10*3/uL (ref 0.0–0.5)
Eosinophils Relative: 0 %
HCT: 35.4 % — ABNORMAL LOW (ref 39.0–52.0)
Hemoglobin: 11.5 g/dL — ABNORMAL LOW (ref 13.0–17.0)
Immature Granulocytes: 1 %
Lymphocytes Relative: 4 %
Lymphs Abs: 0.7 10*3/uL (ref 0.7–4.0)
MCH: 29.9 pg (ref 26.0–34.0)
MCHC: 32.5 g/dL (ref 30.0–36.0)
MCV: 91.9 fL (ref 80.0–100.0)
Monocytes Absolute: 0.7 10*3/uL (ref 0.1–1.0)
Monocytes Relative: 4 %
Neutro Abs: 17.6 10*3/uL — ABNORMAL HIGH (ref 1.7–7.7)
Neutrophils Relative %: 91 %
Platelet Count: 191 10*3/uL (ref 150–400)
RBC: 3.85 MIL/uL — ABNORMAL LOW (ref 4.22–5.81)
RDW: 18.6 % — ABNORMAL HIGH (ref 11.5–15.5)
WBC Count: 19.2 10*3/uL — ABNORMAL HIGH (ref 4.0–10.5)
nRBC: 0 % (ref 0.0–0.2)

## 2021-10-13 LAB — RESP PANEL BY RT-PCR (FLU A&B, COVID) ARPGX2
Influenza A by PCR: POSITIVE — AB
Influenza B by PCR: NEGATIVE
SARS Coronavirus 2 by RT PCR: NEGATIVE

## 2021-10-13 LAB — PROTIME-INR
INR: 1.2 (ref 0.8–1.2)
Prothrombin Time: 15.2 seconds (ref 11.4–15.2)

## 2021-10-13 LAB — LACTIC ACID, PLASMA
Lactic Acid, Venous: 1.9 mmol/L (ref 0.5–1.9)
Lactic Acid, Venous: 1.7 mmol/L (ref 0.5–1.9)

## 2021-10-13 LAB — SAMPLE TO BLOOD BANK

## 2021-10-13 MED ORDER — ACETAMINOPHEN 325 MG PO TABS
650.0000 mg | ORAL_TABLET | Freq: Once | ORAL | Status: AC
  Administered 2021-10-13: 650 mg via ORAL

## 2021-10-13 MED ORDER — OSELTAMIVIR PHOSPHATE 30 MG PO CAPS: 30.0000 mg | ORAL_CAPSULE | Freq: Two times a day (BID) | ORAL | 0 refills | Status: DC

## 2021-10-13 MED ORDER — HEPARIN SOD (PORK) LOCK FLUSH 100 UNIT/ML IV SOLN
500.0000 [IU] | Freq: Once | INTRAVENOUS | Status: AC
  Administered 2021-10-13: 500 [IU]

## 2021-10-13 MED ORDER — LACTATED RINGERS IV BOLUS (SEPSIS)
1000.0000 mL | Freq: Once | INTRAVENOUS | Status: AC
  Administered 2021-10-13: 1000 mL via INTRAVENOUS

## 2021-10-13 MED ORDER — OSELTAMIVIR PHOSPHATE 75 MG PO CAPS: 75.0000 mg | ORAL_CAPSULE | Freq: Two times a day (BID) | ORAL | 0 refills | Status: DC

## 2021-10-13 NOTE — ED Provider Notes (Signed)
[facility] EMERGENCY DEPT Provider Note   CSN: [id number] Arrival date & time: 10/13/21  7829     History Chief Complaint  Patient presents with   Cough    Aaron Johnston is a 75 y.o. male.   Cough Associated symptoms: chills, fever and rhinorrhea   Associated symptoms: no chest pain, no ear pain, no headaches, no myalgias, no rash, no shortness of breath, no sore throat and no wheezing   Patient presents for cough.  Onset was 3 days ago.  It is productive of clear mucus.  Today, he had a temperature of 100.2 degrees.  He endorses sore throat and shortness of breath with coughing spells.  Medical history is notable for metastatic pancreatic cancer.  He has completed cycle 2 of his chemotherapy (5-FU/irinotecan) on 11/8.  Prior to that, he was undergoing cycles of other chemotherapeutic agents.  He has a history of upper GI varices s/p banding, chronic portal venous occlusion, and chronic thrombocytopenia.  He was scheduled to undergo third round of chemotherapy today but was transferred to the ED for evaluation of his recent URI symptoms.    Past Medical History:  Diagnosis Date   Diabetes mellitus without complication ([place])    Family history of breast cancer    Family history of colon cancer    Family history of pancreatic cancer    Family history of stomach cancer     Patient Active Problem List   Diagnosis Date Noted   GI bleeding 09/15/2021   Thrombocytopenia ([place]) 09/08/2021   Protein-calorie malnutrition, severe 08/29/2021   GI bleed 08/27/2021   Genetic testing 08/28/2020   Family history of breast cancer    Family history of pancreatic cancer    Family history of colon cancer    Family history of stomach cancer    Port-A-Cath in place 08/11/2020   Primary cancer of head of pancreas ([place]) 07/22/2020   Goals of care, counseling/discussion 07/22/2020    Past Surgical History:  Procedure Laterality Date   BIOPSY  08/29/2021   Procedure: BIOPSY;   Surgeon: Otis Brace, MD;  Location: [facility] END[facility];  Service: Gastroenterology;;   COLONOSCOPY WITH PROPOFOL N/A 08/29/2021   Procedure: COLONOSCOPY WITH PROPOFOL;  Surgeon: Otis Brace, MD;  Location: [facility];  Service: Gastroenterology;  Laterality: N/A;   ESOPHAGEAL BANDING  09/17/2021   Procedure: ESOPHAGEAL BANDING;  Surgeon: Otis Brace, MD;  Location: [facility];  Service: Gastroenterology;;   ESOPHAGOGASTRODUODENOSCOPY (EGD) WITH PROPOFOL N/A 08/27/2021   Procedure: ESOPHAGOGASTRODUODENOSCOPY (EGD) WITH PROPOFOL;  Surgeon: Clarene Essex, MD;  Location: [facility];  Service: Endoscopy;  Laterality: N/A;   ESOPHAGOGASTRODUODENOSCOPY (EGD) WITH PROPOFOL N/A 09/17/2021   Procedure: ESOPHAGOGASTRODUODENOSCOPY (EGD) WITH PROPOFOL;  Surgeon: Otis Brace, MD;  Location: [facility];  Service: Gastroenterology;  Laterality: N/A;       Family History  Problem Relation Age of Onset   Diabetes Mother    Pancreatic cancer Father        d. 22   Breast cancer Sister 49   Diabetes Sister    Diabetes Brother    Stomach cancer Paternal Aunt        2 pat aunts with stomach cancer   Colon cancer Paternal Uncle    Cancer Paternal Grandfather        NOS   Diabetes Sister    Diabetes Brother    Brain cancer Paternal Aunt    Cancer Paternal Aunt        eye   Diabetes Son  Social History   Tobacco Use   Smoking status: Former    Types: Cigarettes    Quit date: 08/17/1980    Years since quitting: 41.1   Smokeless tobacco: Never  Vaping Use   Vaping Use: Never used  Substance Use Topics   Alcohol use: Not Currently   Drug use: Never    Home Medications Prior to Admission medications   Medication Sig Start Date End Date Taking? Authorizing Provider  oseltamivir (TAMIFLU) 75 MG capsule Take 1 capsule (75 mg total) by mouth every 12 (twelve) hours. 10/13/21  Yes Godfrey Pick, MD  Accu-Chek FastClix Lancets MISC Apply topically. 08/30/20   [provider]  ACCU-CHEK GUIDE test strip  08/31/20   [provider]  Blood Glucose Monitoring Suppl (ACCU-CHEK GUIDE) w/Device KIT See admin instructions. 08/03/20   [provider]  diphenhydrAMINE (BENADRYL) 50 MG capsule Take 50 mg by mouth daily as needed for itching (post-treatment).    [provider]  diphenoxylate-atropine (LOMOTIL) 2.5-0.025 MG tablet Take 1-2 tablets by mouth 4 (four) times daily as needed for diarrhea or loose stools. 09/08/21   Owens Shark, NP  famotidine (PEPCID) 10 MG tablet Take 10 mg by mouth daily as needed (for 1 week after each treatment, for itching).    [provider]  HYDROcodone-acetaminophen (NORCO/VICODIN) 5-325 MG tablet Take 1-2 tablets by mouth every 4 (four) hours as needed. Patient taking differently: Take 1-2 tablets by mouth every 4 (four) hours as needed (for pain). 03/12/21   Owens Shark, NP  KLOR-CON M20 20 MEQ tablet TAKE 1 TABLET BY MOUTH EVERY DAY 10/07/21   Ladell Pier, MD  lactose free nutrition (BOOST PLUS) LIQD Take 237 mLs by mouth 3 (three) times daily with meals. 09/18/21 12/17/21  Hosie Poisson, MD  latanoprost (XALATAN) 0.005 % ophthalmic solution Place 1 drop into both eyes at bedtime.    [provider]  lidocaine-prilocaine (EMLA) cream Apply 1 application topically as directed. Apply to port site 1 hour prior to stick and cover with plastic wrap 07/23/20   Ladell Pier, MD  lipase/protease/amylase (CREON) 36000 UNITS CPEP capsule Take 2 capsules (72,000 Units total) by mouth 3 (three) times daily with meals. May also take 1 capsule (36,000 Units total) as needed. Patient taking differently: Take 2 capsules (72,000 Units total) by mouth 3 (three) times daily with meals. May also take 1 capsule (36,000 Units total) as needed for diarrhea 08/06/21   Owens Shark, NP  loperamide (IMODIUM) 2 MG capsule Take 2-4 mg by mouth 3 (three) times daily as needed for diarrhea or loose stools.     [provider]  loratadine (CLARITIN) 10 MG tablet Take 10 mg by mouth daily as needed for itching (post-treatment).    [provider]  magnesium hydroxide (MILK OF MAGNESIA) 400 MG/5ML suspension Take 30 mLs by mouth daily as needed for mild constipation. 09/18/21   Hosie Poisson, MD  magnesium oxide (MAG-OX) 400 MG tablet Take 1 tablet (400 mg total) by mouth 2 (two) times daily. 06/26/21   Owens Shark, NP  Multiple Vitamin (MULTIVITAMIN WITH MINERALS) TABS tablet Take 1 tablet by mouth daily. 09/18/21   Hosie Poisson, MD  ondansetron (ZOFRAN) 8 MG tablet Take 1 tablet (8 mg total) by mouth every 8 (eight) hours as needed for nausea or vomiting. 07/23/20   Ladell Pier, MD  pantoprazole (PROTONIX) 40 MG tablet Take 1 tablet (40 mg total) by mouth 2 (two)  times daily. 09/01/21   Patrecia Pour, MD  pioglitazone (ACTOS) 45 MG tablet Take 45 mg by mouth daily. 12/28/13   [provider]  prochlorperazine (COMPAZINE) 10 MG tablet Take 1 tablet (10 mg total) by mouth every 6 (six) hours as needed for nausea. 07/23/20   Ladell Pier, MD  propranolol (INDERAL) 10 MG tablet Take 1 tablet (10 mg total) by mouth 2 (two) times daily. 09/18/21   Hosie Poisson, MD  tadalafil (CIALIS) 20 MG tablet Take 20 mg by mouth daily as needed for erectile dysfunction. 06/11/21   [provider]    Allergies    Patient has no known allergies.  Review of Systems   Review of Systems  Constitutional:  Positive for chills and fever. Negative for activity change and appetite change.  HENT:  Positive for congestion and rhinorrhea. Negative for ear pain, facial swelling, sore throat, trouble swallowing and voice change.   Eyes:  Negative for pain and visual disturbance.  Respiratory:  Positive for cough. Negative for shortness of breath and wheezing.   Cardiovascular:  Negative for chest pain and palpitations.  Gastrointestinal:  Negative for abdominal pain, blood in stool,  diarrhea, nausea and vomiting.  Genitourinary:  Negative for dysuria, flank pain and hematuria.  Musculoskeletal:  Negative for arthralgias, back pain, myalgias and neck pain.  Skin:  Negative for color change and rash.  Neurological:  Negative for dizziness, seizures, syncope, weakness, numbness and headaches.  Psychiatric/Behavioral:  Negative for confusion and decreased concentration.   All other systems reviewed and are negative.  Physical Exam Updated Vital Signs BP (!) 95/56 (BP Location: Right Arm)   Pulse 70   Temp 100.1 F (37.8 C) (Rectal)   Resp 18   SpO2 100%   Physical Exam Vitals and nursing note reviewed.  Constitutional:      General: He is not in acute distress.    Appearance: He is well-developed. He is ill-appearing (Chronically). He is not toxic-appearing or diaphoretic.  HENT:     Head: Normocephalic and atraumatic.     Right Ear: Tympanic membrane, ear canal and external ear normal.     Left Ear: Tympanic membrane, ear canal and external ear normal.     Nose: Congestion present.     Mouth/Throat:     Mouth: Mucous membranes are moist.     Pharynx: Oropharynx is clear. No oropharyngeal exudate.  Eyes:     General: No scleral icterus.    Extraocular Movements: Extraocular movements intact.     Conjunctiva/sclera: Conjunctivae normal.  Cardiovascular:     Rate and Rhythm: Normal rate and regular rhythm.     Heart sounds: No murmur heard. Pulmonary:     Effort: Pulmonary effort is normal. No respiratory distress.     Breath sounds: Normal breath sounds. No wheezing or rales.  Chest:     Chest wall: No tenderness.  Abdominal:     Palpations: Abdomen is soft.     Tenderness: There is no abdominal tenderness.  Musculoskeletal:        General: No swelling.     Cervical back: Normal range of motion and neck supple. No rigidity.     Right lower leg: No edema.     Left lower leg: No edema.  Skin:    General: Skin is warm and dry.     Capillary Refill:  Capillary refill takes less than 2 seconds.     Coloration: Skin is not jaundiced or pale.  Neurological:  General: No focal deficit present.     Mental Status: He is alert and oriented to person, place, and time.     Cranial Nerves: No cranial nerve deficit.     Sensory: No sensory deficit.     Motor: No weakness.     Coordination: Coordination normal.  Psychiatric:        Mood and Affect: Mood normal.        Behavior: Behavior normal.        Thought Content: Thought content normal.        Judgment: Judgment normal.    ED Results / Procedures / Treatments   Labs (all labs ordered are listed, but only abnormal results are displayed) Labs Reviewed  RESP PANEL BY RT-PCR (FLU A&B, COVID) ARPGX2 - Abnormal; Notable for the following components:      Result Value   Influenza A by PCR POSITIVE (*)    All other components within normal limits  CULTURE, BLOOD (ROUTINE X 2)  CULTURE, BLOOD (ROUTINE X 2)  LACTIC ACID, PLASMA  LACTIC ACID, PLASMA  PROTIME-INR    EKG EKG Interpretation  Date/Time:  Tuesday October 13 2021 10:09:36 EST Ventricular Rate:  87 PR Interval:  160 QRS Duration: 84 QT Interval:  316 QTC Calculation: 381 R Axis:   -3 Text Interpretation: Sinus rhythm Abnormal R-wave progression, early transition Borderline T wave abnormalities Confirmed by Godfrey Pick [phone]) on 10/13/2021 12:48:59 PM  Radiology DG Chest Port 1 View  Result Date: 10/13/2021 CLINICAL DATA:  Questionable sepsis.  Evaluate for abnormality. EXAM: PORTABLE CHEST 1 VIEW COMPARISON:  09/15/2021 FINDINGS: Left chest wall port a catheter noted with tip in the cavoatrial junction. Heart size and mediastinal contours are unremarkable. No pleural effusion or edema. No airspace densities. The visualized osseous structures appear intact. IMPRESSION: No acute cardiopulmonary abnormalities. Electronically Signed   By: Kerby Moors M.D.   On: 10/13/2021 10:55    Procedures Procedures   Medications  Ordered in ED Medications  lactated ringers bolus 1,000 mL (1,000 mLs Intravenous New Bag/Given 10/13/21 1053)  acetaminophen (TYLENOL) tablet 650 mg (650 mg Oral Given 10/13/21 1050)  heparin lock flush 100 unit/mL (500 Units Intracatheter Given 10/13/21 1456)    ED Course  I have reviewed the triage vital signs and the nursing notes.  Pertinent labs & imaging results that were available during my care of the patient were reviewed by me and considered in my medical decision making (see chart for details).    MDM Rules/Calculators/A&P                          Patient is a 75 year old male with metastatic pancreatic cancer, presenting for URI symptoms.  He was due to get his third cycle of a chemotherapy regimen that he is currently on.  At the office, he was found to have a low-grade fever.  He endorses recent symptoms of cough, chest congestion, and clear mucus production.  He was sent to the ED for further evaluation.  Prior to coming to the ED, labs were obtained at the cancer center.  Results were notable for leukocytosis.  He did receive Neupogen on 11/9.  On arrival, patient's oral temperature was normal.  He arrives with his wife at bedside.  Patient had a recent hospitalization for GI bleeding.  He did undergo gastric variceal banding.  He denies any recent dark stools.  Hemoglobin today was 11.5, greater than baseline.  Given his recent fever,  work-up was initiated to identify source.  Patient was very clear on arrival in the ED that he would like to avoid hospitalization if possible.  Bolus of IV fluids were ordered.   Patient has a history of thrombocytopenia and does avoid NSAIDs when possible.  Although today, platelet count was normal at 191,000.  On exam, although he had a normal oral temperature, patient did feel warm to the touch.  Rectal temperature was obtained and found to be elevated.  Tylenol was given with fever improvement.  Results of work-up showed influenza A infection.  No  pneumonia is identified on chest x-ray.  Patient and wife were informed of results.  Given his chronic illness, patient was offered hospital admission.  Patient states that he feels well enough to go home.  Lactic acid was normal.  His blood pressures are soft but his wife states that, at baseline, blood pressures are normally in the range of 90s over 50s-60s.  Patient and wife do seem reliable and state that they will return for any worsening of symptoms.  Patient was prescribed Tamiflu.  Patient and wife were encouraged to return to the ED at any time if they do change your mind about hospital admission.  Patient was discharged in stable condition.  Final Clinical Impression(s) / ED Diagnoses Final diagnoses:  Influenza A    Rx / DC Orders ED Discharge Orders          Ordered    oseltamivir (TAMIFLU) 30 MG capsule  2 times daily,   Status:  Discontinued        10/13/21 1449    oseltamivir (TAMIFLU) 75 MG capsule  Every 12 hours        10/13/21 1502             Godfrey Pick, MD 10/14/21 [phone]

## 2021-10-13 NOTE — Telephone Encounter (Signed)
Per [name], NP, report was called to [facility] in the ER due to the pt's symptoms of coughing, SOB, and fever.  Pt was taken to room 16 in the ER to be evaluated.

## 2021-10-13 NOTE — ED Triage Notes (Signed)
Pt arrives to ED from cancer center. Pt reports that he developed as cough that started on 11/26. The cough is productive, arising from his chest. He states his phlegm is clear. He reports fever today of 100.2. Associated symptoms sore throat and clear rhinorrhea. He does report SOB when he coughs. He has hx of metastatic pancreatic cancer and was due for his chemotherapy today.

## 2021-10-13 NOTE — Progress Notes (Signed)
Aaron Johnston   Diagnosis: Pancreas cancer  INTERVAL HISTORY:   Aaron Johnston returns as scheduled.  He completed cycle two 5-FU/liposomal irinotecan 09/22/2021.  He reports developing nasal congestion and a cough around 10/10/2021.  He reports the cough has "moved" to his chest.  He is expectorating clear phlegm.  He feels short of breath.  He has a sore throat.  Temperature 100.2 in the office today.  He did not check his temperature at home.  He has not aware of any sick contacts.  He has not COVID tested or sought evaluation for his symptoms prior to today.  Objective:  Vital signs in last 24 hours:  Blood pressure 124/65, pulse 100, temperature 100.2 F (37.9 C), temperature source Oral, resp. rate 18, height _0  (1.727 m), weight 121 lb 6.4 oz (55.1 kg), SpO2 100 %.   Chronically ill-appearing. HEENT: Posterior pharynx is without erythema or exudate.  No ulcers. Resp: Initially fine rales at the left lung base, cleared with coughing.  No respiratory distress. Cardio: Regular, mild tachycardia. GI: No hepatosplenomegaly. Vascular: No leg edema. Neuro: Alert and oriented. Skin: Decreased skin turgor. Port-A-Cath without erythema.  Lab Results:  Lab Results  Component Value Date   WBC 19.2 (H) 10/13/2021   HGB 11.5 (L) 10/13/2021   HCT 35.4 (L) 10/13/2021   MCV 91.9 10/13/2021   PLT 191 10/13/2021   NEUTROABS 17.6 (H) 10/13/2021    Imaging:  No results found.  Medications: I have reviewed the patient's current medications.  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,  mild biliary ductal dilatation, diffuse hypodense/hypoenhancing areas within the liver (edema versus infiltrating neoplasm), 2 x 1.7 cm area of focal prominence in the pancreas EUS 07/10/2020-no pancreas mass identified, 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, 1.5 cm aortocaval node biopsy-scant lymphoid material, no malignancy Diagnostic laparoscopy 07/17/2020-segment 3 and 4 liver nodules, adenocarcinoma, positive for pankeratin, CK7 and CK20, partially positive for TTF-1, negative for CDX2 Foundation 1-microsatellite stable, tumor mutation burden 4, K-ras G12D, subclonal RB1 alteration Elevated CA 19-9 Cycle 1 FOLFOX 07/28/2020 Cycle 2 FOLFOX 08/18/2020, oxaliplatin dose reduced due to thrombocytopenia Cycle 3 FOLFOX 09/08/2020 Cycle 4 FOLFOX 09/29/2020  Cycle 5 FOLFOX 10/20/2020 CTs 11/06/2020-grossly stable infiltrative mass within the pancreatic head and porta hepatis.  New small low-density liver lesions.  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 Cycle 6 FOLFOX 11/10/2021  Cycle 7 FOLFOX 12/01/2020 MRI liver 12/04/2020-no significant change in posttreatment appearance of the pancreatic head.  Numerous intrinsically low signal hypoenhancing lesions of the liver parenchyma predominantly observed in the right lobe of the liver and liver dome, measuring no greater than 8 mm and as seen on recent prior CT of the abdomen/pelvis. Cycle 8 FOLFOX 12/22/2020 Cycle 9 FOLFOX 01/12/2021 Cycle 10 FOLFOX 02/02/2021 MRI liver 02/19/2021-no change in pancreas head mass, multiple hypoenhancing liver lesions are new and increased in size, effacement of portal vein by pancreas head mass with cavernous transformation, no change in mild splenomegaly Cycle 1 gemcitabine/Abraxane 03/12/2021 Cycle 2 gemcitabine/Abraxane 03/27/2021- dose reductions secondary to neutropenia Cycle 3  gemcitabine/Abraxane 04/17/2021-dose reductions due to thrombocytopenia, white cell growth factor support added Cycle 4 gemcitabine/Abraxane 05/01/2021, Ziextenzo Cycle 5 gemcitabine/Abraxane 05/15/2021, Ziextenzo MRI abdomen 05/26/2021-mild enlargement of dominant hepatic metastases, appearance of infiltrative pancreas mass extending to the celiac trunk and SMA, splenic vein occlusion, cavernous transformation of the portal vein with upper abdomen collaterals, splenomegaly, mild upper abdominal ascites and mesenteric edema, minimal subpleural nodularity left lower lobe Cycle 6 gemcitabine/Abraxane 05/28/2021 Cycle 7 gemcitabine/Abraxane 06/12/2021 Cycle 8 gemcitabine/Abraxane 06/26/2021 Cycle 9 gemcitabine/Abraxane 07/10/2021 Cycle 10 gemcitabine/Abraxane 07/23/2021 Cycle 11 gemcitabine/Abraxane 08/06/2021 MRI abdomen 08/20/2021-multifocal nodularity in the lower chest with a new discrete right lower lobe nodule, enlarging subsegment 7/8 liver lesion, other small lesions appear stable, gastric varices, review of images with radiology-enlargement of at least 2 liver lesions, suspicious nodule n the right lower lung Cycle 1  5-FU/liposomal irinotecan 09/08/2021 Cycle 2  5-FU/liposomal irinotecan 09/22/2021   Chronic thrombocytopenia- weekly Nplate beginning 5/88/5027 Diabetes Hypertension Abdomen/back pain 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   Admission with recurrent GI bleeding and severe anemia 09/15/2021 Upper endoscopy  09/17/2021-esophageal varices with stigmata of recent bleeding, banded.  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  He completed cycle two 5-FU/liposomal irinotecan 09/22/2021.  He presents today prior to proceeding with cycle 3.  He is accompanied by his wife.  They report an approximate 3-day history of nasal congestion, sore throat, cough and more recently shortness of breath.  He has not completed a home COVID test.  The white count is elevated, he has a low-grade fever and mild tachycardia.  He received white cell growth factor support 09/23/2021.  We are transporting him to the emergency department for evaluation.  We will decide on rescheduling his chemotherapy pending ED evaluation.  Plan reviewed with Dr. Benay Spice.   [name] ANP/GNP-BC   10/13/2021  9:11 AM

## 2021-10-14 ENCOUNTER — Telehealth: Payer: Self-pay | Admitting: Surgery

## 2021-10-14 LAB — CANCER ANTIGEN 19-9: CA 19-9: 8807 U/mL — ABNORMAL HIGH (ref 0–35)

## 2021-10-14 NOTE — Telephone Encounter (Signed)
I called the pt to check on him today, as well as to let him know the dates for his rescheduled appointments.  I left a message for him to call our office back.

## 2021-10-15 ENCOUNTER — Other Ambulatory Visit: Payer: Self-pay | Admitting: *Deleted

## 2021-10-15 ENCOUNTER — Inpatient Hospital Stay: Payer: Medicare Other

## 2021-10-15 DIAGNOSIS — C259 Malignant neoplasm of pancreas, unspecified: Secondary | ICD-10-CM

## 2021-10-18 LAB — CULTURE, BLOOD (ROUTINE X 2)
Culture: NO GROWTH
Culture: NO GROWTH
Special Requests: ADEQUATE

## 2021-10-19 ENCOUNTER — Inpatient Hospital Stay: Payer: Medicare Other | Attending: Genetic Counselor | Admitting: Nurse Practitioner

## 2021-10-19 ENCOUNTER — Inpatient Hospital Stay: Payer: Medicare Other

## 2021-10-19 ENCOUNTER — Encounter: Payer: Self-pay | Admitting: Nurse Practitioner

## 2021-10-19 ENCOUNTER — Other Ambulatory Visit: Payer: Self-pay

## 2021-10-19 VITALS — BP 107/60 | HR 64 | Temp 98.7°F | Resp 20 | Ht 68.0 in | Wt 123.6 lb

## 2021-10-19 DIAGNOSIS — G629 Polyneuropathy, unspecified: Secondary | ICD-10-CM | POA: Diagnosis not present

## 2021-10-19 DIAGNOSIS — C250 Malignant neoplasm of head of pancreas: Secondary | ICD-10-CM | POA: Diagnosis present

## 2021-10-19 DIAGNOSIS — D696 Thrombocytopenia, unspecified: Secondary | ICD-10-CM | POA: Diagnosis not present

## 2021-10-19 DIAGNOSIS — C259 Malignant neoplasm of pancreas, unspecified: Secondary | ICD-10-CM

## 2021-10-19 DIAGNOSIS — C787 Secondary malignant neoplasm of liver and intrahepatic bile duct: Secondary | ICD-10-CM | POA: Insufficient documentation

## 2021-10-19 DIAGNOSIS — Z5189 Encounter for other specified aftercare: Secondary | ICD-10-CM | POA: Insufficient documentation

## 2021-10-19 DIAGNOSIS — E119 Type 2 diabetes mellitus without complications: Secondary | ICD-10-CM | POA: Insufficient documentation

## 2021-10-19 DIAGNOSIS — I10 Essential (primary) hypertension: Secondary | ICD-10-CM | POA: Insufficient documentation

## 2021-10-19 DIAGNOSIS — D709 Neutropenia, unspecified: Secondary | ICD-10-CM | POA: Diagnosis not present

## 2021-10-19 DIAGNOSIS — Z5111 Encounter for antineoplastic chemotherapy: Secondary | ICD-10-CM | POA: Diagnosis not present

## 2021-10-19 DIAGNOSIS — Z95828 Presence of other vascular implants and grafts: Secondary | ICD-10-CM

## 2021-10-19 LAB — CMP (CANCER CENTER ONLY)
ALT: 51 U/L — ABNORMAL HIGH (ref 0–44)
AST: 40 U/L (ref 15–41)
Albumin: 3.6 g/dL (ref 3.5–5.0)
Alkaline Phosphatase: 271 U/L — ABNORMAL HIGH (ref 38–126)
Anion gap: 7 (ref 5–15)
BUN: 12 mg/dL (ref 8–23)
CO2: 20 mmol/L — ABNORMAL LOW (ref 22–32)
Calcium: 8.5 mg/dL — ABNORMAL LOW (ref 8.9–10.3)
Chloride: 110 mmol/L (ref 98–111)
Creatinine: 1.05 mg/dL (ref 0.61–1.24)
GFR, Estimated: >60 mL/min (ref >60)
Glucose, Bld: 197 mg/dL — ABNORMAL HIGH (ref 70–99)
Potassium: 4 mmol/L (ref 3.5–5.1)
Sodium: 137 mmol/L (ref 135–145)
Total Bilirubin: 0.7 mg/dL (ref 0.3–1.2)
Total Protein: 6 g/dL — ABNORMAL LOW (ref 6.5–8.1)

## 2021-10-19 LAB — CBC WITH DIFFERENTIAL (CANCER CENTER ONLY)
Abs Immature Granulocytes: 0.02 10*3/uL (ref 0.00–0.07)
Basophils Absolute: 0 10*3/uL (ref 0.0–0.1)
Basophils Relative: 1 %
Eosinophils Absolute: 0.1 10*3/uL (ref 0.0–0.5)
Eosinophils Relative: 1 %
HCT: 29.8 % — ABNORMAL LOW (ref 39.0–52.0)
Hemoglobin: 9.5 g/dL — ABNORMAL LOW (ref 13.0–17.0)
Immature Granulocytes: 0 %
Lymphocytes Relative: 11 %
Lymphs Abs: 0.7 10*3/uL (ref 0.7–4.0)
MCH: 30.1 pg (ref 26.0–34.0)
MCHC: 31.9 g/dL (ref 30.0–36.0)
MCV: 94.3 fL (ref 80.0–100.0)
Monocytes Absolute: 0.3 10*3/uL (ref 0.1–1.0)
Monocytes Relative: 5 %
Neutro Abs: 5 10*3/uL (ref 1.7–7.7)
Neutrophils Relative %: 82 %
Platelet Count: 135 10*3/uL — ABNORMAL LOW (ref 150–400)
RBC: 3.16 MIL/uL — ABNORMAL LOW (ref 4.22–5.81)
RDW: 18.4 % — ABNORMAL HIGH (ref 11.5–15.5)
WBC Count: 6.1 10*3/uL (ref 4.0–10.5)
nRBC: 0 % (ref 0.0–0.2)

## 2021-10-19 LAB — SAMPLE TO BLOOD BANK

## 2021-10-19 MED ORDER — LIDOCAINE-PRILOCAINE 2.5-2.5 % EX CREA: 1.0000 "application " | TOPICAL_CREAM | CUTANEOUS | 2 refills | Status: AC

## 2021-10-19 NOTE — Progress Notes (Signed)
[place] OFFICE PROGRESS NOTE   Diagnosis: Pancreas cancer  INTERVAL HISTORY:   [name] returns for follow-up.  He has completed 2 cycles of 5-FU/liposomal irinotecan.  When he was here 10/13/2021 for cycle 3 he reported fever, sore throat, shortness of breath.  Treatment was held.  He was evaluated in the emergency department and diagnosed with influenza A.  He was prescribed Tamiflu.  He is feeling better.  He has completed the course of Tamiflu.  No fever.  No sore throat.  No shortness of breath.  No change in baseline bowel habits of loose stools.  He has a good appetite.  Objective:  Vital signs in last 24 hours:  Blood pressure 107/60, pulse 64, temperature 98.7 F (37.1 C), temperature source Oral, resp. rate 20, height '5\' 8"'  (1.727 m), weight 123 lb 9.6 oz (56.1 kg), SpO2 100 %.    HEENT: No thrush or ulcers. Resp: Lungs clear bilaterally. Cardio: Regular rate and rhythm. GI: No hepatosplenomegaly. Vascular: No leg edema. Port-A-Cath without erythema.  Lab Results:  Lab Results  Component Value Date   WBC 6.1 10/19/2021   HGB 9.5 (L) 10/19/2021   HCT 29.8 (L) 10/19/2021   MCV 94.3 10/19/2021   PLT 135 (L) 10/19/2021   NEUTROABS 5.0 10/19/2021    Imaging:  No results found.  Medications: I have reviewed the patient's current medications.  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, mild biliary ductal dilatation, diffuse hypodense/hypoenhancing areas within the liver (edema versus infiltrating neoplasm), 2 x 1.7 cm area of focal prominence in the pancreas EUS 07/10/2020-no  pancreas mass identified, 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, 1.5 cm aortocaval node biopsy-scant lymphoid material, no malignancy Diagnostic laparoscopy 07/17/2020-segment 3 and 4 liver nodules, adenocarcinoma, positive for pankeratin, CK7 and CK20, partially positive for TTF-1, negative for CDX2 Foundation 1-microsatellite stable, tumor mutation burden 4, K-ras G12D, subclonal RB1 alteration Elevated CA 19-9 Cycle 1 FOLFOX 07/28/2020 Cycle 2 FOLFOX 08/18/2020, oxaliplatin dose reduced due to thrombocytopenia Cycle 3 FOLFOX 09/08/2020 Cycle 4 FOLFOX 09/29/2020  Cycle 5 FOLFOX 10/20/2020 CTs 11/06/2020-grossly stable infiltrative mass within the pancreatic head and porta hepatis.  New small low-density liver lesions.  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 Cycle 6 FOLFOX 11/10/2021  Cycle 7 FOLFOX 12/01/2020 MRI liver 12/04/2020-no significant change in posttreatment appearance of the pancreatic head.  Numerous intrinsically low signal hypoenhancing lesions of the liver parenchyma predominantly observed in the right lobe of the liver and liver dome, measuring no greater than 8 mm and as seen on recent prior CT of the abdomen/pelvis. Cycle 8 FOLFOX 12/22/2020 Cycle 9 FOLFOX 01/12/2021 Cycle 10 FOLFOX 02/02/2021 MRI liver 02/19/2021-no change in pancreas head mass, multiple hypoenhancing liver lesions are new and increased in size, effacement of portal vein by pancreas head mass with cavernous transformation, no change in mild splenomegaly Cycle 1 gemcitabine/Abraxane 03/12/2021 Cycle 2 gemcitabine/Abraxane 03/27/2021- dose reductions secondary to neutropenia Cycle 3 gemcitabine/Abraxane 04/17/2021-dose reductions due to thrombocytopenia, white cell growth factor support added Cycle 4 gemcitabine/Abraxane 05/01/2021, Ziextenzo Cycle 5 gemcitabine/Abraxane 05/15/2021,  Ziextenzo MRI abdomen 05/26/2021-mild  enlargement of dominant hepatic metastases, appearance of infiltrative pancreas mass extending to the celiac trunk and SMA, splenic vein occlusion, cavernous transformation of the portal vein with upper abdomen collaterals, splenomegaly, mild upper abdominal ascites and mesenteric edema, minimal subpleural nodularity left lower lobe Cycle 6 gemcitabine/Abraxane 05/28/2021 Cycle 7 gemcitabine/Abraxane 06/12/2021 Cycle 8 gemcitabine/Abraxane 06/26/2021 Cycle 9 gemcitabine/Abraxane 07/10/2021 Cycle 10 gemcitabine/Abraxane 07/23/2021 Cycle 11 gemcitabine/Abraxane 08/06/2021 MRI abdomen 08/20/2021-multifocal nodularity in the lower chest with a new discrete right lower lobe nodule, enlarging subsegment 7/8 liver lesion, other small lesions appear stable, gastric varices, review of images with radiology-enlargement of at least 2 liver lesions, suspicious nodule n the right lower lung Cycle 1  5-FU/liposomal irinotecan 09/08/2021 Cycle 2  5-FU/liposomal irinotecan 09/22/2021 Cycle 3  5-FU/liposomal irinotecan 10/20/2021   Chronic thrombocytopenia- weekly Nplate beginning 07/21/8933 Diabetes Hypertension Abdomen/back pain 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   Admission with recurrent GI bleeding and severe anemia 09/15/2021 Upper endoscopy 09/17/2021-esophageal varices with stigmata of recent bleeding, banded.  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   Influenza A 10/13/2021      Disposition: [name] appears stable.  He was diagnosed with influenza A last week.  He seems to have adequately recovered to resume chemotherapy.  He would like to proceed with treatment tomorrow as scheduled.  Plan for cycle 3 5-FU/liposomal irinotecan 10/20/2021.  CBC and chemistry panel reviewed.  Labs adequate to proceed as above.  Plan to continue weekly Nplate.  He will return for follow-up in approximately 2 weeks.  We are available to see him sooner if needed.  Plan reviewed with Dr. Benay Spice.    [name] ANP/GNP-BC   10/19/2021  2:48 PM

## 2021-10-20 ENCOUNTER — Inpatient Hospital Stay: Payer: Medicare Other

## 2021-10-20 ENCOUNTER — Encounter: Payer: Self-pay | Admitting: Nurse Practitioner

## 2021-10-20 ENCOUNTER — Other Ambulatory Visit: Payer: Self-pay | Admitting: *Deleted

## 2021-10-20 ENCOUNTER — Encounter: Payer: Self-pay | Admitting: Oncology

## 2021-10-20 VITALS — BP 119/69 | HR 77 | Temp 98.0°F | Resp 20

## 2021-10-20 DIAGNOSIS — Z95828 Presence of other vascular implants and grafts: Secondary | ICD-10-CM

## 2021-10-20 DIAGNOSIS — C250 Malignant neoplasm of head of pancreas: Secondary | ICD-10-CM

## 2021-10-20 DIAGNOSIS — Z5111 Encounter for antineoplastic chemotherapy: Secondary | ICD-10-CM | POA: Diagnosis not present

## 2021-10-20 DIAGNOSIS — D696 Thrombocytopenia, unspecified: Secondary | ICD-10-CM

## 2021-10-20 MED ORDER — SODIUM CHLORIDE 0.9 % IV SOLN
300.0000 mg/m2 | Freq: Once | INTRAVENOUS | Status: AC
  Administered 2021-10-20: 508 mg via INTRAVENOUS
  Filled 2021-10-20: qty 25.4

## 2021-10-20 MED ORDER — SODIUM CHLORIDE 0.9 % IV SOLN
40.0000 mg/m2 | Freq: Once | INTRAVENOUS | Status: AC
  Administered 2021-10-20: 68.8 mg via INTRAVENOUS
  Filled 2021-10-20: qty 16

## 2021-10-20 MED ORDER — PALONOSETRON HCL INJECTION 0.25 MG/5ML
0.2500 mg | Freq: Once | INTRAVENOUS | Status: AC
  Administered 2021-10-20: 0.25 mg via INTRAVENOUS
  Filled 2021-10-20: qty 5

## 2021-10-20 MED ORDER — SODIUM CHLORIDE 0.9 % IV SOLN
10.0000 mg | Freq: Once | INTRAVENOUS | Status: AC
  Administered 2021-10-20: 10 mg via INTRAVENOUS
  Filled 2021-10-20: qty 1

## 2021-10-20 MED ORDER — SODIUM CHLORIDE 0.9 % IV SOLN
1800.0000 mg/m2 | INTRAVENOUS | Status: DC
  Administered 2021-10-20: 3050 mg via INTRAVENOUS
  Filled 2021-10-20: qty 61

## 2021-10-20 MED ORDER — ROMIPLOSTIM 125 MCG ~~LOC~~ SOLR
2.0000 ug/kg | Freq: Once | SUBCUTANEOUS | Status: AC
  Administered 2021-10-20: 110 ug via SUBCUTANEOUS
  Filled 2021-10-20: qty 0.22

## 2021-10-20 MED ORDER — SODIUM CHLORIDE 0.9 % IV SOLN: Freq: Once | INTRAVENOUS | Status: AC

## 2021-10-20 NOTE — Patient Instructions (Signed)
[facility]   Discharge Instructions: Thank you for choosing [facility] to provide your oncology and hematology care.   If you have a lab appointment with the [facility], please go directly to the [facility] and check in at the registration area.   Wear comfortable clothing and clothing appropriate for easy access to any Portacath or PICC line.   We strive to give you quality time with your provider. You may need to reschedule your appointment if you arrive late (15 or more minutes).  Arriving late affects you and other patients whose appointments are after yours.  Also, if you miss three or more appointments without notifying the office, you may be dismissed from the clinic at the provider's discretion.      For prescription refill requests, have your pharmacy contact our office and allow 72 hours for refills to be completed.    Today you received the following chemotherapy and/or immunotherapy agents Irinotecan Liposome (ONIVYDE), Leucovorin & Flourouracil (ADRUCIL).      To help prevent nausea and vomiting after your treatment, we encourage you to take your nausea medication as directed.  BELOW ARE SYMPTOMS THAT SHOULD BE REPORTED IMMEDIATELY: *FEVER GREATER THAN 100.4 F (38 C) OR HIGHER *CHILLS OR SWEATING *NAUSEA AND VOMITING THAT IS NOT CONTROLLED WITH YOUR NAUSEA MEDICATION *UNUSUAL SHORTNESS OF BREATH *UNUSUAL BRUISING OR BLEEDING *URINARY PROBLEMS (pain or burning when urinating, or frequent urination) *BOWEL PROBLEMS (unusual diarrhea, constipation, pain near the anus) TENDERNESS IN MOUTH AND THROAT WITH OR WITHOUT PRESENCE OF ULCERS (sore throat, sores in mouth, or a toothache) UNUSUAL RASH, SWELLING OR PAIN  UNUSUAL VAGINAL DISCHARGE OR ITCHING   Items with * indicate a potential emergency and should be followed up as soon as possible or go to the Emergency Department if any problems should occur.  Please show the  CHEMOTHERAPY ALERT CARD or IMMUNOTHERAPY ALERT CARD at check-in to the Emergency Department and triage nurse.  Should you have questions after your visit or need to cancel or reschedule your appointment, please contact [facility]  Dept: [phone]  and follow the prompts.  Office hours are 8:00 a.m. to 4:30 p.m. Monday - Friday. Please note that voicemails left after 4:00 p.m. may not be returned until the following business day.  We are closed weekends and major holidays. You have access to a nurse at all times for urgent questions. Please call the main number to the clinic Dept: [phone] and follow the prompts.   For any non-urgent questions, you may also contact your provider using MyChart. We now offer e-Visits for anyone 52 and older to request care online for non-urgent symptoms. For details visit [email or web address].   Also download the MyChart app! Go to the app store, search "MyChart", open the app, select [facility], and log in with your MyChart username and password.  Due to Covid, a mask is required upon entering the hospital/clinic. If you do not have a mask, one will be given to you upon arrival. For doctor visits, patients may have 1 support person aged 46 or older with them. For treatment visits, patients cannot have anyone with them due to current Covid guidelines and our immunocompromised population.   Irinotecan Liposomal injection What is this medication? IRINOTECAN LIPOSOME (eye ri noe TEE kan LIP oh som) is a chemotherapy drug. It is used to treat pancreatic cancer. This medicine may be used for other purposes; ask your health care provider or  pharmacist if you have questions. COMMON BRAND NAME(S): ONIVYDE What should I tell my care team before I take this medication? They need to know if you have any of these conditions: bleeding disorders dehydration history of blood diseases, like sickle cell anemia or leukemia history of low levels  of calcium, magnesium, or potassium in the blood infection (especially a virus infection such as chickenpox, cold sores, or herpes) liver disease low blood counts, like low white cell, platelet, or red cell counts lung or breathing disease, like asthma recent or ongoing radiation therapy an unusual or allergic reaction to irinotecan liposome, other medicines, foods, dyes, or preservatives pregnant or trying to get pregnant breast-feeding How should I use this medication? This drug is given as an infusion into a vein. It is administered in a hospital or clinic by a specially trained health care professional. Talk to your pediatrician regarding the use of this medicine in children. Special care may be needed. Overdosage: If you think you have taken too much of this medicine contact a poison control center or emergency room at once. NOTE: This medicine is only for you. Do not share this medicine with others. What if I miss a dose? It is important not to miss your dose. Call your doctor or health care professional if you are unable to keep an appointment. What may interact with this medication? This medicine may interact with the following medications: antiviral medicines for HIV or AIDS certain medications for fungal infections like ketoconazole, itraconazole, voriconazole certain medications for seizures like carbamazepine, fosphenytoin, phenytoin clarithromycin gemfibrozil mephobarbital nefazodone phenobarbital primidone rifabutin rifampin rifapentine [facility]. John's Wort telaprevir This list may not describe all possible interactions. Give your health care provider a list of all the medicines, herbs, non-prescription drugs, or dietary supplements you use. Also tell them if you smoke, drink alcohol, or use illegal drugs. Some items may interact with your medicine. What should I watch for while using this medication? Check with your doctor or health care professional if you get an attack  of severe diarrhea, nausea and vomiting, or if you sweat a lot. The loss of too much body fluid can make it dangerous for you to take this medicine. This medicine may interfere with the ability to have a child. You should talk with your doctor or health care professional if you are concerned about your fertility. Do not become pregnant while taking this medicine or for 1 month after the last dose; males with male partners should use condoms during treatment and for 4 months after the last dose. Women should inform their doctor if they wish to become pregnant or think they might be pregnant. There is a potential for serious side effects to an unborn child. Talk to your health care professional or pharmacist for more information. Do not breast-feed an infant while taking this medicine or for 1 month after the last dose. Avoid taking products that contain aspirin, acetaminophen, ibuprofen, naproxen, or ketoprofen unless instructed by your doctor. These medicines may hide a fever. Be careful brushing and flossing your teeth or using a toothpick because you may get an infection or bleed more easily. If you have any dental work done, tell your dentist you are receiving this medicine. Call your doctor or health care professional for advice if you get a fever, chills or sore throat, or other symptoms of a cold or flu. Do not treat yourself. This drug decreases your body's ability to fight infections. Try to avoid being around people who are  sick. This medicine may increase your risk to bruise or bleed. Call your doctor or health care professional if you notice any unusual bleeding. This drug may make you feel generally unwell. This is not uncommon, as chemotherapy can affect healthy cells as well as cancer cells. Report any side effects. Continue your course of treatment even though you feel ill unless your doctor tells you to stop. What side effects may I notice from receiving this medication? Side effects that  you should report to your doctor or health care professional as soon as possible: allergic reactions like skin rash, itching or hives, swelling of the face, lips, or tongue breathing problems cough diarrhea low blood counts - this medicine may decrease the number of white blood cells, red blood cells and platelets. You may be at increased risk for infections and bleeding nausea, vomiting signs and symptoms of bleeding such as bloody or black, tarry stools; red or dark-brown urine; spitting up blood or brown material that looks like coffee grounds; red spots on the skin; unusual bruising or bleeding from the eye, gums, or nose signs of decreased red blood cells - unusually weak or tired, feeling faint or lightheaded, falls signs and symptoms of infection like fever or chills; cough; sore throat; pain or trouble passing urine signs and symptoms of liver injury like dark yellow or brown urine; general ill feeling or flu-like symptoms; light-colored stools; loss of appetite; nausea; right upper belly pain; unusually weak or tired; yellowing of the eyes or skin stomach pain Side effects that usually do not require medical attention (report to your doctor or health care professional if they continue or are bothersome): hair loss loss of appetite mouth sores upset stomach This list may not describe all possible side effects. Call your doctor for medical advice about side effects. You may report side effects to FDA at 1-800-FDA-1088. Where should I keep my medication? This drug is given in a hospital or clinic and will not be stored at home. NOTE: This sheet is a summary. It may not cover all possible information. If you have questions about this medicine, talk to your doctor, pharmacist, or health care provider.  2022 Elsevier/Gold Standard (2015-12-04 00:00:00)  Leucovorin injection What is this medication? LEUCOVORIN (loo koe VOR in) is used to prevent or treat the harmful effects of some  medicines. This medicine is used to treat anemia caused by a low amount of folic acid in the body. It is also used with 5-fluorouracil (5-FU) to treat colon cancer. This medicine may be used for other purposes; ask your health care provider or pharmacist if you have questions. What should I tell my care team before I take this medication? They need to know if you have any of these conditions: anemia from low levels of vitamin B-12 in the blood an unusual or allergic reaction to leucovorin, folic acid, other medicines, foods, dyes, or preservatives pregnant or trying to get pregnant breast-feeding How should I use this medication? This medicine is for injection into a muscle or into a vein. It is given by a health care professional in a hospital or clinic setting. Talk to your pediatrician regarding the use of this medicine in children. Special care may be needed. Overdosage: If you think you have taken too much of this medicine contact a poison control center or emergency room at once. NOTE: This medicine is only for you. Do not share this medicine with others. What if I miss a dose? This does not apply.  What may interact with this medication? capecitabine fluorouracil phenobarbital phenytoin primidone trimethoprim-sulfamethoxazole This list may not describe all possible interactions. Give your health care provider a list of all the medicines, herbs, non-prescription drugs, or dietary supplements you use. Also tell them if you smoke, drink alcohol, or use illegal drugs. Some items may interact with your medicine. What should I watch for while using this medication? Your condition will be monitored carefully while you are receiving this medicine. This medicine may increase the side effects of 5-fluorouracil, 5-FU. Tell your doctor or health care professional if you have diarrhea or mouth sores that do not get better or that get worse. What side effects may I notice from receiving this  medication? Side effects that you should report to your doctor or health care professional as soon as possible: allergic reactions like skin rash, itching or hives, swelling of the face, lips, or tongue breathing problems fever, infection mouth sores unusual bleeding or bruising unusually weak or tired Side effects that usually do not require medical attention (report to your doctor or health care professional if they continue or are bothersome): constipation or diarrhea loss of appetite nausea, vomiting This list may not describe all possible side effects. Call your doctor for medical advice about side effects. You may report side effects to FDA at 1-800-FDA-1088. Where should I keep my medication? This drug is given in a hospital or clinic and will not be stored at home. NOTE: This sheet is a summary. It may not cover all possible information. If you have questions about this medicine, talk to your doctor, pharmacist, or health care provider.  2022 Elsevier/Gold Standard (2008-05-09 00:00:00)  Fluorouracil, 5-FU injection What is this medication? FLUOROURACIL, 5-FU (flure oh YOOR a sil) is a chemotherapy drug. It slows the growth of cancer cells. This medicine is used to treat many types of cancer like breast cancer, colon or rectal cancer, pancreatic cancer, and stomach cancer. This medicine may be used for other purposes; ask your health care provider or pharmacist if you have questions. COMMON BRAND NAME(S): Adrucil What should I tell my care team before I take this medication? They need to know if you have any of these conditions: blood disorders dihydropyrimidine dehydrogenase (DPD) deficiency infection (especially a virus infection such as chickenpox, cold sores, or herpes) kidney disease liver disease malnourished, poor nutrition recent or ongoing radiation therapy an unusual or allergic reaction to fluorouracil, other chemotherapy, other medicines, foods, dyes, or  preservatives pregnant or trying to get pregnant breast-feeding How should I use this medication? This drug is given as an infusion or injection into a vein. It is administered in a hospital or clinic by a specially trained health care professional. Talk to your pediatrician regarding the use of this medicine in children. Special care may be needed. Overdosage: If you think you have taken too much of this medicine contact a poison control center or emergency room at once. NOTE: This medicine is only for you. Do not share this medicine with others. What if I miss a dose? It is important not to miss your dose. Call your doctor or health care professional if you are unable to keep an appointment. What may interact with this medication? Do not take this medicine with any of the following medications: live virus vaccines This medicine may also interact with the following medications: medicines that treat or prevent blood clots like warfarin, enoxaparin, and dalteparin This list may not describe all possible interactions. Give your health care provider  a list of all the medicines, herbs, non-prescription drugs, or dietary supplements you use. Also tell them if you smoke, drink alcohol, or use illegal drugs. Some items may interact with your medicine. What should I watch for while using this medication? Visit your doctor for checks on your progress. This drug may make you feel generally unwell. This is not uncommon, as chemotherapy can affect healthy cells as well as cancer cells. Report any side effects. Continue your course of treatment even though you feel ill unless your doctor tells you to stop. In some cases, you may be given additional medicines to help with side effects. Follow all directions for their use. Call your doctor or health care professional for advice if you get a fever, chills or sore throat, or other symptoms of a cold or flu. Do not treat yourself. This drug decreases your body's  ability to fight infections. Try to avoid being around people who are sick. This medicine may increase your risk to bruise or bleed. Call your doctor or health care professional if you notice any unusual bleeding. Be careful brushing and flossing your teeth or using a toothpick because you may get an infection or bleed more easily. If you have any dental work done, tell your dentist you are receiving this medicine. Avoid taking products that contain aspirin, acetaminophen, ibuprofen, naproxen, or ketoprofen unless instructed by your doctor. These medicines may hide a fever. Do not become pregnant while taking this medicine. Women should inform their doctor if they wish to become pregnant or think they might be pregnant. There is a potential for serious side effects to an unborn child. Talk to your health care professional or pharmacist for more information. Do not breast-feed an infant while taking this medicine. Men should inform their doctor if they wish to father a child. This medicine may lower sperm counts. Do not treat diarrhea with over the counter products. Contact your doctor if you have diarrhea that lasts more than 2 days or if it is severe and watery. This medicine can make you more sensitive to the sun. Keep out of the sun. If you cannot avoid being in the sun, wear protective clothing and use sunscreen. Do not use sun lamps or tanning beds/booths. What side effects may I notice from receiving this medication? Side effects that you should report to your doctor or health care professional as soon as possible: allergic reactions like skin rash, itching or hives, swelling of the face, lips, or tongue low blood counts - this medicine may decrease the number of white blood cells, red blood cells and platelets. You may be at increased risk for infections and bleeding. signs of infection - fever or chills, cough, sore throat, pain or difficulty passing urine signs of decreased platelets or  bleeding - bruising, pinpoint red spots on the skin, black, tarry stools, blood in the urine signs of decreased red blood cells - unusually weak or tired, fainting spells, lightheadedness breathing problems changes in vision chest pain mouth sores nausea and vomiting pain, swelling, redness at site where injected pain, tingling, numbness in the hands or feet redness, swelling, or sores on hands or feet stomach pain unusual bleeding Side effects that usually do not require medical attention (report to your doctor or health care professional if they continue or are bothersome): changes in finger or toe nails diarrhea dry or itchy skin hair loss headache loss of appetite sensitivity of eyes to the light stomach upset unusually teary eyes This list may  not describe all possible side effects. Call your doctor for medical advice about side effects. You may report side effects to FDA at 1-800-FDA-1088. Where should I keep my medication? This drug is given in a hospital or clinic and will not be stored at home. NOTE: This sheet is a summary. It may not cover all possible information. If you have questions about this medicine, talk to your doctor, pharmacist, or health care provider.  2022 Elsevier/Gold Standard (2021-07-21 00:00:00)  The chemotherapy medication bag should finish at 46 hours, 96 hours, or 7 days. For example, if your pump is scheduled for 46 hours and it was put on at 4:00 p.m., it should finish at 2:00 p.m. the day it is scheduled to come off regardless of your appointment time.     Estimated time to finish at 10:45 a.m. on Thursday 10/22/2021.   If the display on your pump reads "Low Volume" and it is beeping, take the batteries out of the pump and come to the cancer center for it to be taken off.   If the pump alarms go off prior to the pump reading "Low Volume" then call [phone] and someone can assist you.  If the plunger comes out and the chemotherapy  medication is leaking out, please use your home chemo spill kit to clean up the spill. Do NOT use paper towels or other household products.  If you have problems or questions regarding your pump, please call either [phone] (24 hours a day) or the cancer center Monday-Friday 8:00 a.m.- 4:30 p.m. at the clinic number and we will assist you. If you are unable to get assistance, then go to the nearest Emergency Department and ask the staff to contact the IV team for assistance.   Romiplostim injection What is this medication? ROMIPLOSTIM (roe mi PLOE stim) helps your body make more platelets. This medicine is used to treat low platelets caused by chronic idiopathic thrombocytopenic purpura (ITP) or a bone marrow syndrome caused by radiation sickness. This medicine may be used for other purposes; ask your health care provider or pharmacist if you have questions. COMMON BRAND NAME(S): Nplate What should I tell my care team before I take this medication? They need to know if you have any of these conditions: blood clots myelodysplastic syndrome an unusual or allergic reaction to romiplostim, mannitol, other medicines, foods, dyes, or preservatives pregnant or trying to get pregnant breast-feeding How should I use this medication? This medicine is injected under the skin. It is given by a health care provider in a hospital or clinic setting. A special MedGuide will be given to you before each treatment. Be sure to read this information carefully each time. Talk to your health care provider about the use of this medicine in children. While it may be prescribed for children as young as newborns for selected conditions, precautions do apply. Overdosage: If you think you have taken too much of this medicine contact a poison control center or emergency room at once. NOTE: This medicine is only for you. Do not share this medicine with others. What if I miss a dose? Keep appointments for follow-up  doses. It is important not to miss your dose. Call your health care provider if you are unable to keep an appointment. What may interact with this medication? Interactions are not expected. This list may not describe all possible interactions. Give your health care provider a list of all the medicines, herbs, non-prescription drugs, or dietary supplements you use. Also tell  them if you smoke, drink alcohol, or use illegal drugs. Some items may interact with your medicine. What should I watch for while using this medication? Visit your health care provider for regular checks on your progress. You may need blood work done while you are taking this medicine. Your condition will be monitored carefully while you are receiving this medicine. It is important not to miss any appointments. What side effects may I notice from receiving this medication? Side effects that you should report to your doctor or health care professional as soon as possible: allergic reactions (skin rash, itching or hives; swelling of the face, lips, or tongue) bleeding (bloody or black, tarry stools; red or dark brown urine; spitting up blood or brown material that looks like coffee grounds; red spots on the skin; unusual bruising or bleeding from the eyes, gums, or nose) blood clot (chest pain; shortness of breath; pain, swelling, or warmth in the leg) stroke (changes in vision; confusion; trouble speaking or understanding; severe headaches; sudden numbness or weakness of the face, arm or leg; trouble walking; dizziness; loss of balance or coordination) Side effects that usually do not require medical attention (report to your doctor or health care professional if they continue or are bothersome): diarrhea dizziness headache joint pain muscle pain stomach pain trouble sleeping This list may not describe all possible side effects. Call your doctor for medical advice about side effects. You may report side effects to FDA at  1-800-FDA-1088. Where should I keep my medication? This medicine is given in a hospital or clinic. It will not be stored at home. NOTE: This sheet is a summary. It may not cover all possible information. If you have questions about this medicine, talk to your doctor, pharmacist, or health care provider.  2022 Elsevier/Gold Standard (2021-07-21 00:00:00)

## 2021-10-20 NOTE — Progress Notes (Signed)
Patient presents for treatment. RN assessment completed along with the following:  Labs/vitals reviewed - Yes, and within treatment parameters.   Weight within 10% of previous measurement - Yes Oncology Treatment Attestation completed for current therapy- Yes, on date 09/01/2021 Informed consent completed and reflects current therapy/intent - Yes, on date 09/08/2021             Provider progress note reviewed - Patient not seen by provider today. Most recent note dated 10/19/2021 reviewed. Treatment/Antibody/Supportive plan reviewed - Yes, and there are no adjustments needed for today's treatment. S&H and other orders reviewed - Yes, and there are no additional orders identified. Previous treatment date reviewed - Yes, and the appropriate amount of time has elapsed between treatments. Clinic Hand Off Received from - No.  Patient to proceed with treatment.

## 2021-10-22 ENCOUNTER — Inpatient Hospital Stay: Payer: Medicare Other

## 2021-10-22 ENCOUNTER — Other Ambulatory Visit: Payer: Self-pay

## 2021-10-22 VITALS — BP 98/63 | HR 66 | Temp 97.6°F | Resp 18

## 2021-10-22 DIAGNOSIS — Z5111 Encounter for antineoplastic chemotherapy: Secondary | ICD-10-CM | POA: Diagnosis not present

## 2021-10-22 DIAGNOSIS — C250 Malignant neoplasm of head of pancreas: Secondary | ICD-10-CM

## 2021-10-22 MED ORDER — PEGFILGRASTIM-BMEZ 6 MG/0.6ML ~~LOC~~ SOSY
6.0000 mg | PREFILLED_SYRINGE | Freq: Once | SUBCUTANEOUS | Status: AC
  Administered 2021-10-22: 6 mg via SUBCUTANEOUS

## 2021-10-22 MED ORDER — HEPARIN SOD (PORK) LOCK FLUSH 100 UNIT/ML IV SOLN
500.0000 [IU] | Freq: Once | INTRAVENOUS | Status: AC | PRN
  Administered 2021-10-22: 500 [IU]

## 2021-10-22 MED ORDER — SODIUM CHLORIDE 0.9% FLUSH
10.0000 mL | INTRAVENOUS | Status: DC | PRN
  Administered 2021-10-22: 10 mL

## 2021-10-22 NOTE — Patient Instructions (Signed)

## 2021-10-22 NOTE — Progress Notes (Signed)
Attempted to obtain medical history via telephone, unable to reach at this time. I left a voicemail to return pre surgical testing department's phone call.  

## 2021-10-27 ENCOUNTER — Inpatient Hospital Stay: Payer: Medicare Other

## 2021-10-27 ENCOUNTER — Other Ambulatory Visit: Payer: Self-pay

## 2021-10-27 VITALS — BP 90/50 | HR 68 | Temp 97.7°F | Resp 18 | Wt 126.0 lb

## 2021-10-27 DIAGNOSIS — C250 Malignant neoplasm of head of pancreas: Secondary | ICD-10-CM

## 2021-10-27 DIAGNOSIS — C259 Malignant neoplasm of pancreas, unspecified: Secondary | ICD-10-CM

## 2021-10-27 DIAGNOSIS — Z95828 Presence of other vascular implants and grafts: Secondary | ICD-10-CM

## 2021-10-27 DIAGNOSIS — Z5111 Encounter for antineoplastic chemotherapy: Secondary | ICD-10-CM | POA: Diagnosis not present

## 2021-10-27 DIAGNOSIS — D696 Thrombocytopenia, unspecified: Secondary | ICD-10-CM

## 2021-10-27 LAB — CBC WITH DIFFERENTIAL (CANCER CENTER ONLY)
Abs Immature Granulocytes: 0.32 10*3/uL — ABNORMAL HIGH (ref 0.00–0.07)
Basophils Absolute: 0.1 10*3/uL (ref 0.0–0.1)
Basophils Relative: 1 %
Eosinophils Absolute: 0.2 10*3/uL (ref 0.0–0.5)
Eosinophils Relative: 2 %
HCT: 28.8 % — ABNORMAL LOW (ref 39.0–52.0)
Hemoglobin: 9.1 g/dL — ABNORMAL LOW (ref 13.0–17.0)
Immature Granulocytes: 4 %
Lymphocytes Relative: 7 %
Lymphs Abs: 0.6 10*3/uL — ABNORMAL LOW (ref 0.7–4.0)
MCH: 29.8 pg (ref 26.0–34.0)
MCHC: 31.6 g/dL (ref 30.0–36.0)
MCV: 94.4 fL (ref 80.0–100.0)
Monocytes Absolute: 0.2 10*3/uL (ref 0.1–1.0)
Monocytes Relative: 3 %
Neutro Abs: 6.6 10*3/uL (ref 1.7–7.7)
Neutrophils Relative %: 83 %
Platelet Count: 76 10*3/uL — ABNORMAL LOW (ref 150–400)
RBC: 3.05 MIL/uL — ABNORMAL LOW (ref 4.22–5.81)
RDW: 18.4 % — ABNORMAL HIGH (ref 11.5–15.5)
WBC Count: 8 10*3/uL (ref 4.0–10.5)
nRBC: 0 % (ref 0.0–0.2)

## 2021-10-27 LAB — SAMPLE TO BLOOD BANK

## 2021-10-27 MED ORDER — HEPARIN SOD (PORK) LOCK FLUSH 100 UNIT/ML IV SOLN
500.0000 [IU] | Freq: Once | INTRAVENOUS | Status: AC | PRN
  Administered 2021-10-27: 500 [IU]

## 2021-10-27 MED ORDER — ROMIPLOSTIM 125 MCG ~~LOC~~ SOLR
2.0000 ug/kg | Freq: Once | SUBCUTANEOUS | Status: AC
  Administered 2021-10-27: 115 ug via SUBCUTANEOUS
  Filled 2021-10-27: qty 0.23

## 2021-10-27 MED ORDER — SODIUM CHLORIDE 0.9% FLUSH
10.0000 mL | INTRAVENOUS | Status: DC | PRN
  Administered 2021-10-27: 10 mL

## 2021-10-27 NOTE — Patient Instructions (Signed)
Romiplostim injection ?What is this medication? ?ROMIPLOSTIM (roe mi PLOE stim) helps your body make more platelets. This medicine is used to treat low platelets caused by chronic idiopathic thrombocytopenic purpura (ITP) or a bone marrow syndrome caused by radiation sickness. ?This medicine may be used for other purposes; ask your health care provider or pharmacist if you have questions. ?COMMON BRAND NAME(S): Nplate ?What should I tell my care team before I take this medication? ?They need to know if you have any of these conditions: ?blood clots ?myelodysplastic syndrome ?an unusual or allergic reaction to romiplostim, mannitol, other medicines, foods, dyes, or preservatives ?pregnant or trying to get pregnant ?breast-feeding ?How should I use this medication? ?This medicine is injected under the skin. It is given by a health care provider in a hospital or clinic setting. ?A special MedGuide will be given to you before each treatment. Be sure to read this information carefully each time. ?Talk to your health care provider about the use of this medicine in children. While it may be prescribed for children as young as newborns for selected conditions, precautions do apply. ?Overdosage: If you think you have taken too much of this medicine contact a poison control center or emergency room at once. ?NOTE: This medicine is only for you. Do not share this medicine with others. ?What if I miss a dose? ?Keep appointments for follow-up doses. It is important not to miss your dose. Call your health care provider if you are unable to keep an appointment. ?What may interact with this medication? ?Interactions are not expected. ?This list may not describe all possible interactions. Give your health care provider a list of all the medicines, herbs, non-prescription drugs, or dietary supplements you use. Also tell them if you smoke, drink alcohol, or use illegal drugs. Some items may interact with your medicine. ?What should I  watch for while using this medication? ?Visit your health care provider for regular checks on your progress. You may need blood work done while you are taking this medicine. Your condition will be monitored carefully while you are receiving this medicine. It is important not to miss any appointments. ?What side effects may I notice from receiving this medication? ?Side effects that you should report to your doctor or health care professional as soon as possible: ?allergic reactions (skin rash, itching or hives; swelling of the face, lips, or tongue) ?bleeding (bloody or black, tarry stools; red or dark brown urine; spitting up blood or brown material that looks like coffee grounds; red spots on the skin; unusual bruising or bleeding from the eyes, gums, or nose) ?blood clot (chest pain; shortness of breath; pain, swelling, or warmth in the leg) ?stroke (changes in vision; confusion; trouble speaking or understanding; severe headaches; sudden numbness or weakness of the face, arm or leg; trouble walking; dizziness; loss of balance or coordination) ?Side effects that usually do not require medical attention (report to your doctor or health care professional if they continue or are bothersome): ?diarrhea ?dizziness ?headache ?joint pain ?muscle pain ?stomach pain ?trouble sleeping ?This list may not describe all possible side effects. Call your doctor for medical advice about side effects. You may report side effects to FDA at 1-800-FDA-1088. ?Where should I keep my medication? ?This medicine is given in a hospital or clinic. It will not be stored at home. ?NOTE: This sheet is a summary. It may not cover all possible information. If you have questions about this medicine, talk to your doctor, pharmacist, or health care provider. ??   2022 Elsevier/Gold Standard (2021-07-21 00:00:00) ? ?

## 2021-11-01 ENCOUNTER — Other Ambulatory Visit: Payer: Self-pay | Admitting: Oncology

## 2021-11-03 ENCOUNTER — Other Ambulatory Visit: Payer: Self-pay

## 2021-11-03 ENCOUNTER — Ambulatory Visit (HOSPITAL_COMMUNITY): Payer: Medicare Other | Admitting: Certified Registered Nurse Anesthetist

## 2021-11-03 ENCOUNTER — Ambulatory Visit (HOSPITAL_COMMUNITY)
Admission: RE | Admit: 2021-11-03 | Discharge: 2021-11-03 | Disposition: A | Discharge status: Home / Self Care | Payer: Medicare Other | Attending: Gastroenterology | Admitting: Gastroenterology

## 2021-11-03 ENCOUNTER — Encounter (HOSPITAL_COMMUNITY)
Admission: RE | Disposition: A | Discharge status: Home / Self Care | Payer: Self-pay | Source: Home / Self Care | Attending: Gastroenterology

## 2021-11-03 ENCOUNTER — Encounter (HOSPITAL_COMMUNITY): Payer: Self-pay | Admitting: Gastroenterology

## 2021-11-03 DIAGNOSIS — Z87891 Personal history of nicotine dependence: Secondary | ICD-10-CM | POA: Insufficient documentation

## 2021-11-03 DIAGNOSIS — I864 Gastric varices: Secondary | ICD-10-CM | POA: Insufficient documentation

## 2021-11-03 DIAGNOSIS — I8500 Esophageal varices without bleeding: Secondary | ICD-10-CM | POA: Diagnosis not present

## 2021-11-03 DIAGNOSIS — Z8507 Personal history of malignant neoplasm of pancreas: Secondary | ICD-10-CM | POA: Diagnosis not present

## 2021-11-03 DIAGNOSIS — Z79899 Other long term (current) drug therapy: Secondary | ICD-10-CM | POA: Insufficient documentation

## 2021-11-03 DIAGNOSIS — K766 Portal hypertension: Secondary | ICD-10-CM | POA: Insufficient documentation

## 2021-11-03 DIAGNOSIS — Z7984 Long term (current) use of oral hypoglycemic drugs: Secondary | ICD-10-CM | POA: Insufficient documentation

## 2021-11-03 DIAGNOSIS — E119 Type 2 diabetes mellitus without complications: Secondary | ICD-10-CM | POA: Diagnosis not present

## 2021-11-03 DIAGNOSIS — K3189 Other diseases of stomach and duodenum: Secondary | ICD-10-CM | POA: Diagnosis not present

## 2021-11-03 HISTORY — PX: ESOPHAGOGASTRODUODENOSCOPY (EGD) WITH PROPOFOL: SHX5813

## 2021-11-03 HISTORY — PX: ESOPHAGEAL BANDING: SHX5518

## 2021-11-03 LAB — GLUCOSE, CAPILLARY: Glucose-Capillary: 85 mg/dL (ref 70–99)

## 2021-11-03 SURGERY — ESOPHAGOGASTRODUODENOSCOPY (EGD) WITH PROPOFOL
Anesthesia: Monitor Anesthesia Care

## 2021-11-03 MED ORDER — PROPOFOL 500 MG/50ML IV EMUL
INTRAVENOUS | Status: DC | PRN
  Administered 2021-11-03: 130 ug/kg/min via INTRAVENOUS

## 2021-11-03 MED ORDER — PROPOFOL 10 MG/ML IV BOLUS
INTRAVENOUS | Status: DC | PRN
  Administered 2021-11-03: 30 mg via INTRAVENOUS

## 2021-11-03 MED ORDER — LIDOCAINE 2% (20 MG/ML) 5 ML SYRINGE
INTRAMUSCULAR | Status: DC | PRN
Start: 2021-11-03 — End: 2021-11-03
  Administered 2021-11-03: 50 mg via INTRAVENOUS

## 2021-11-03 MED ORDER — SODIUM CHLORIDE 0.9 % IV SOLN: INTRAVENOUS | Status: DC

## 2021-11-03 MED ORDER — LACTATED RINGERS IV SOLN: INTRAVENOUS | Status: DC

## 2021-11-03 MED ORDER — PROPOFOL 10 MG/ML IV BOLUS
INTRAVENOUS | Status: AC
  Filled 2021-11-03: qty 20

## 2021-11-03 SURGICAL SUPPLY — 15 items

## 2021-11-03 NOTE — Transfer of Care (Signed)
Immediate Anesthesia Transfer of Care Note  Patient: Aaron Johnston  Procedure(s) Performed: ESOPHAGOGASTRODUODENOSCOPY (EGD) WITH PROPOFOL ESOPHAGEAL BANDING  Patient Location: PACU and Endoscopy Unit  Anesthesia Type:MAC  Level of Consciousness: awake, alert  and patient cooperative  Airway & Oxygen Therapy: Patient Spontanous Breathing and Patient connected to face mask oxygen  Post-op Assessment: Report given to RN and Post -op Vital signs reviewed and stable  Post vital signs: Reviewed and stable  Last Vitals:  Vitals Value Taken Time  BP 128/74 11/03/21 1120  Temp    Pulse 84 11/03/21 1121  Resp 22 11/03/21 1121  SpO2 97 % 11/03/21 1121  Vitals shown include unvalidated device data.  Last Pain:  Vitals:   11/03/21 1042  TempSrc: Oral  PainSc: 0-No pain         Complications: No notable events documented.

## 2021-11-03 NOTE — Op Note (Signed)
[facility] Patient Name: Aaron Johnston Procedure Date: 11/03/2021 MRN: [id number] Attending MD: Otis Brace , MD Date of Birth: Sep 17, 1946 CSN: [id number] Age: 75 Admit Type: Outpatient Procedure:                Upper GI endoscopy Indications:              For therapy of esophageal varices Providers:                Otis Brace, MD, Doristine Johns, RN, Lodema Hong Technician, Technician Referring MD:              Medicines:                Sedation Administered by an Anesthesia Professional Complications:            No immediate complications. Estimated Blood Loss:     Estimated blood loss was minimal. Procedure:                Pre-Anesthesia Assessment:                           - Prior to the procedure, a History and Physical                            was performed, and patient medications and                            allergies were reviewed. The patient's tolerance of                            previous anesthesia was also reviewed. The risks                            and benefits of the procedure and the sedation                            options and risks were discussed with the patient.                            All questions were answered, and informed consent                            was obtained. Prior Anticoagulants: The patient has                            taken no previous anticoagulant or antiplatelet                            agents. ASA Grade Assessment: III - A patient with                            severe systemic disease. After reviewing the risks  and benefits, the patient was deemed in                            satisfactory condition to undergo the procedure.                           After obtaining informed consent, the endoscope was                            passed under direct vision. Throughout the                            procedure, the patient's blood pressure,  pulse, and                            oxygen saturations were monitored continuously. The                            [id number]-H190 ([id number]) Olympus endoscope was introduced                            through the mouth, and advanced to the second part                            of duodenum. The upper GI endoscopy was                            accomplished without difficulty. The patient                            tolerated the procedure well. Scope In: Scope Out: Findings:      Three columns of large (> 5 mm) varices with no bleeding and no stigmata       of recent bleeding were found in the mid esophagus and in the distal       esophagus,. Red wale signs were present. Stigmata of prior treatment       were evident. Scarring from prior treatment was visible. The varices       appeared smaller than they were at prior exam. Three bands were       successfully placed with complete eradication, resulting in deflation of       varices. There was no bleeding at the end of the procedure.      Type 1 isolated gastric varices (IGV1, varices located in the fundus)       with no bleeding were found in the gastric fundus. There were no       stigmata of recent bleeding. They were small in largest diameter.      Mild portal hypertensive gastropathy was found in the entire examined       stomach.      The duodenal bulb, first portion of the duodenum and second portion of       the duodenum were normal. 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                            Completely eradicated. Banded.                           - Type 1 isolated gastric varices (IGV1, varices                            located in the fundus), without bleeding.                           - Portal hypertensive gastropathy.                           - Normal duodenal bulb, first portion of the                             duodenum and second portion of the duodenum.                           - No specimens collected. Moderate Sedation:      Moderate (conscious) sedation was personally administered by an       anesthesia professional. The following parameters were monitored: oxygen       saturation, heart rate, blood pressure, and response to care. Recommendation:           - Patient has a contact number available for                            emergencies. The signs and symptoms of potential                            delayed complications were discussed with the                            patient. Return to normal activities tomorrow.                            Written discharge instructions were provided to the                            patient.                           - Resume previous diet.                           - Continue present medications.                           - Repeat upper endoscopy in 3 months for                            retreatment.                           -  Return to my office PRN. Procedure Code(s):        --- Professional ---                           [phone], Esophagogastroduodenoscopy, flexible,                            transoral; with band ligation of esophageal/gastric                            varices Diagnosis Code(s):        --- Professional ---                           I85.00, Esophageal varices without bleeding                           I86.4, Gastric varices                           K76.6, Portal hypertension                           K31.89, Other diseases of stomach and duodenum CPT copyright 2019 [employer]. All rights reserved. The codes documented in this report are preliminary and upon coder review may  be revised to meet current compliance requirements. Otis Brace, MD Otis Brace, MD 11/03/2021 11:18:37 AM Number of Addenda: 0

## 2021-11-03 NOTE — Anesthesia Postprocedure Evaluation (Signed)
Anesthesia Post Note  Patient: Aaron Johnston  Procedure(s) Performed: ESOPHAGOGASTRODUODENOSCOPY (EGD) WITH PROPOFOL ESOPHAGEAL BANDING     Patient location during evaluation: PACU Anesthesia Type: MAC Level of consciousness: awake and alert and oriented Pain management: pain level controlled Vital Signs Assessment: post-procedure vital signs reviewed and stable Respiratory status: spontaneous breathing, nonlabored ventilation and respiratory function stable Cardiovascular status: blood pressure returned to baseline Postop Assessment: no apparent nausea or vomiting Anesthetic complications: no   No notable events documented.  Last Vitals:  Vitals:   11/03/21 1139 11/03/21 1142  BP:  (!) 111/49  Pulse: 75 74  Resp: (!) 21 20  Temp:    SpO2: 97% 97%    Last Pain:  Vitals:   11/03/21 1130  TempSrc: Oral  PainSc: 0-No pain                 Marthenia Rolling

## 2021-11-03 NOTE — Anesthesia Procedure Notes (Signed)
Procedure Name: MAC Date/Time: 11/03/2021 11:00 AM Performed by: West Pugh, CRNA Pre-anesthesia Checklist: Patient identified, Emergency Drugs available, Suction available, Patient being monitored and Timeout performed Patient Re-evaluated:Patient Re-evaluated prior to induction Oxygen Delivery Method: Simple face mask Placement Confirmation: positive ETCO2 Dental Injury: Teeth and Oropharynx as per pre-operative assessment

## 2021-11-03 NOTE — H&P (Signed)
Primary Care Physician:  [name], [name], MD Primary Gastroenterologist:    Reason for Visit : Outpatient EGD for esophageal varices  HPI: Aaron Johnston is a 75 y.o. male with past medical history of metastatic pancreatic adenocarcinoma is here today for outpatient EGD for band ligation.  Patient was admitted to the hospital in November 2022 with hematemesis.  EGD at that time showed large esophageal varices and 3 bands were placed.  EGD also showed gastric varices at that time.  Patient denies any further bleeding since last procedure.  He denies abdominal pain.  Complaining of intermittent nausea and vomiting.  Denies fresh blood in the stool.  Labs reviewed.  Blood work on October 27, 2021 showed hemoglobin of 9.1  Past Medical History:  Diagnosis Date   Diabetes mellitus without complication ([place])    Family history of breast cancer    Family history of colon cancer    Family history of pancreatic cancer    Family history of stomach cancer     Past Surgical History:  Procedure Laterality Date   BIOPSY  08/29/2021   Procedure: BIOPSY;  Surgeon: Otis Brace, MD;  Location: [facility];  Service: Gastroenterology;;   COLONOSCOPY WITH PROPOFOL N/A 08/29/2021   Procedure: COLONOSCOPY WITH PROPOFOL;  Surgeon: Otis Brace, MD;  Location: [facility];  Service: Gastroenterology;  Laterality: N/A;   ESOPHAGEAL BANDING  09/17/2021   Procedure: ESOPHAGEAL BANDING;  Surgeon: Otis Brace, MD;  Location: [facility];  Service: Gastroenterology;;   ESOPHAGOGASTRODUODENOSCOPY (EGD) WITH PROPOFOL N/A 08/27/2021   Procedure: ESOPHAGOGASTRODUODENOSCOPY (EGD) WITH PROPOFOL;  Surgeon: Clarene Essex, MD;  Location: [facility];  Service: Endoscopy;  Laterality: N/A;   ESOPHAGOGASTRODUODENOSCOPY (EGD) WITH PROPOFOL N/A 09/17/2021   Procedure: ESOPHAGOGASTRODUODENOSCOPY (EGD) WITH PROPOFOL;  Surgeon: Otis Brace, MD;  Location: [facility];  Service: Gastroenterology;   Laterality: N/A;    Prior to Admission medications   Medication Sig Start Date End Date Taking? Authorizing Provider  Accu-Chek FastClix Lancets MISC Apply topically. 08/30/20  Yes [provider]  ACCU-CHEK GUIDE test strip  08/31/20  Yes [provider]  Blood Glucose Monitoring Suppl (ACCU-CHEK GUIDE) w/Device KIT See admin instructions. 08/03/20  Yes [provider]  diphenhydrAMINE (BENADRYL) 50 MG capsule Take 50 mg by mouth daily as needed for itching (post-treatment).   Yes [provider]  diphenoxylate-atropine (LOMOTIL) 2.5-0.025 MG tablet Take 1-2 tablets by mouth 4 (four) times daily as needed for diarrhea or loose stools. 09/08/21  Yes Owens Shark, NP  famotidine (PEPCID) 10 MG tablet Take 10 mg by mouth daily as needed (for 1 week after each treatment, for itching).   Yes [provider]  HYDROcodone-acetaminophen (NORCO/VICODIN) 5-325 MG tablet Take 1-2 tablets by mouth every 4 (four) hours as needed. 03/12/21  Yes Owens Shark, NP  KLOR-CON M20 20 MEQ tablet TAKE 1 TABLET BY MOUTH EVERY DAY 10/07/21  Yes Ladell Pier, MD  lactose free nutrition (BOOST PLUS) LIQD Take 237 mLs by mouth 3 (three) times daily with meals. 09/18/21 12/17/21 Yes Hosie Poisson, MD  latanoprost (XALATAN) 0.005 % ophthalmic solution Place 1 drop into both eyes at bedtime.   Yes [provider]  lidocaine-prilocaine (EMLA) cream Apply 1 application topically as directed. Apply to port site 1 hour prior to stick and cover with plastic wrap 10/19/21  Yes Owens Shark, NP  lipase/protease/amylase (CREON) 36000 UNITS CPEP capsule Take 2 capsules (72,000 Units total) by mouth 3 (three) times daily with meals. May also take 1  capsule (36,000 Units total) as needed. Patient taking differently: Take 2 capsules (72,000 Units total) by mouth 3 (three) times daily with meals. May also take 1 capsule (36,000 Units total) as needed for diarrhea 08/06/21  Yes Owens Shark, NP  loperamide (IMODIUM) 2 MG capsule Take 2-4 mg by mouth 3 (three) times daily as needed for diarrhea or loose stools.   Yes [provider]  loratadine (CLARITIN) 10 MG tablet Take 10 mg by mouth daily as needed for itching (post-treatment).   Yes [provider]  magnesium hydroxide (MILK OF MAGNESIA) 400 MG/5ML suspension Take 30 mLs by mouth daily as needed for mild constipation. 09/18/21  Yes Hosie Poisson, MD  magnesium oxide (MAG-OX) 400 MG tablet Take 1 tablet (400 mg total) by mouth 2 (two) times daily. 06/26/21  Yes Owens Shark, NP  Multiple Vitamin (MULTIVITAMIN WITH MINERALS) TABS tablet Take 1 tablet by mouth daily. 09/18/21  Yes Hosie Poisson, MD  ondansetron (ZOFRAN) 8 MG tablet Take 1 tablet (8 mg total) by mouth every 8 (eight) hours as needed for nausea or vomiting. 07/23/20  Yes Ladell Pier, MD  pantoprazole (PROTONIX) 40 MG tablet Take 1 tablet (40 mg total) by mouth 2 (two) times daily. 09/01/21  Yes Patrecia Pour, MD  pioglitazone (ACTOS) 45 MG tablet Take 45 mg by mouth daily. 12/28/13  Yes [provider]  prochlorperazine (COMPAZINE) 10 MG tablet Take 1 tablet (10 mg total) by mouth every 6 (six) hours as needed for nausea. 07/23/20  Yes Ladell Pier, MD  propranolol (INDERAL) 10 MG tablet Take 1 tablet (10 mg total) by mouth 2 (two) times daily. 09/18/21  Yes Hosie Poisson, MD  tadalafil (CIALIS) 20 MG tablet Take 20 mg by mouth daily as needed for erectile dysfunction. 06/11/21  Yes [provider]  oseltamivir (TAMIFLU) 75 MG capsule Take 1 capsule (75 mg total) by mouth every 12 (twelve) hours. Patient not taking: Reported on 10/19/2021 10/13/21   Godfrey Pick, MD    Scheduled Meds: Continuous Infusions:  sodium chloride     lactated ringers     PRN Meds:.  Allergies as of 09/22/2021   (No Known Allergies)    Family History  Problem Relation Age of Onset   Diabetes Mother    Pancreatic cancer Father        d. 87    Breast cancer Sister 46   Diabetes Sister    Diabetes Brother    Stomach cancer Paternal Aunt        2 pat aunts with stomach cancer   Colon cancer Paternal Uncle    Cancer Paternal Grandfather        NOS   Diabetes Sister    Diabetes Brother    Brain cancer Paternal Aunt    Cancer Paternal Aunt        eye   Diabetes Son     Social History   Socioeconomic History   Marital status: Married    Spouse name: Not on file   Number of children: Not on file   Years of education: Not on file   Highest education level: Not on file  Occupational History   Not on file  Tobacco Use   Smoking status: Former    Types: Cigarettes    Quit date: 08/17/1980    Years since quitting: 41.2   Smokeless tobacco: Never  Vaping Use   Vaping Use: Never used  Substance and Sexual Activity  Alcohol use: Not Currently   Drug use: Never   Sexual activity: Not on file  Other Topics Concern   Not on file  Social History Narrative   Not on file   Social Determinants of Health   Financial Resource Strain: Not on file  Food Insecurity: Not on file  Transportation Needs: Not on file  Physical Activity: Not on file  Stress: Not on file  Social Connections: Not on file  Intimate Partner Violence: Not on file    Review of Systems: All negative except as stated above in HPI.  Physical Exam: Vital signs: Vitals:   11/03/21 1042  BP: 110/62  Pulse: 68  Resp: 14  Temp: 98 F (36.7 C)  SpO2: 100%     General: Thin appearing patient, not in acute distress Lungs: No visible respiratory distress rhonchi.  Heart:  Regular rate and rhythm; no murmurs, clicks, rubs,  or gallops. Abdomen: Soft, nontender, nondistended, bowel sounds present Rectal:  Deferred  GI:  Lab Results: No results for input(s): WBC, HGB, HCT, PLT in the last 72 hours. BMET No results for input(s): NA, K, CL, CO2, GLUCOSE, BUN, CREATININE, CALCIUM in the last 72 hours. LFT No results for input(s): PROT, ALBUMIN,  AST, ALT, ALKPHOS, BILITOT, BILIDIR, IBILI in the last 72 hours. PT/INR No results for input(s): LABPROT, INR in the last 72 hours.   Studies/Results: No results found.  Impression/Plan: -Esophageal varices. -Metastatic pancreatic adenocarcinoma  Recommendations ----------------------- -Proceed with EGD with possible band ligation today.  Risks (bleeding, infection, bowel perforation that could require surgery, sedation-related changes in cardiopulmonary systems), benefits (identification and possible treatment of source of symptoms, exclusion of certain causes of symptoms), and alternatives (watchful waiting, radiographic imaging studies, empiric medical treatment)  were explained to patient/family in detail and patient wishes to proceed.     LOS: 0 days   Otis Brace  MD, FACP 11/03/2021, 10:49 AM  Contact #  [phone]

## 2021-11-03 NOTE — Anesthesia Preprocedure Evaluation (Addendum)
Anesthesia Evaluation  Patient identified by MRN, date of birth, ID band Patient awake    Reviewed: Allergy & Precautions, NPO status , Patient's Chart, lab work & pertinent test results  History of Anesthesia Complications Negative for: history of anesthetic complications  Airway Mallampati: II  TM Distance: >3 FB Neck ROM: Full    Dental  (+) Missing,    Pulmonary former smoker,    Pulmonary exam normal        Cardiovascular negative cardio ROS Normal cardiovascular exam     Neuro/Psych negative neurological ROS  negative psych ROS   GI/Hepatic GERD  Medicated and Controlled,(+) Cirrhosis   Esophageal Varices    ,   Endo/Other  diabetes, Type 2, Oral Hypoglycemic Agents  Renal/GU negative Renal ROS  negative genitourinary   Musculoskeletal negative musculoskeletal ROS (+)   Abdominal   Peds  Hematology negative hematology ROS (+)   Anesthesia Other Findings Day of surgery medications reviewed with patient.  Reproductive/Obstetrics negative OB ROS                            Anesthesia Physical Anesthesia Plan  ASA: 3  Anesthesia Plan: MAC   Post-op Pain Management: Minimal or no pain anticipated   Induction:   PONV Risk Score and Plan: Treatment may vary due to age or medical condition and Propofol infusion  Airway Management Planned: Natural Airway and Nasal Cannula  Additional Equipment: None  Intra-op Plan:   Post-operative Plan:   Informed Consent: I have reviewed the patients History and Physical, chart, labs and discussed the procedure including the risks, benefits and alternatives for the proposed anesthesia with the patient or authorized representative who has indicated his/her understanding and acceptance.       Plan Discussed with: CRNA  Anesthesia Plan Comments:        Anesthesia Quick Evaluation

## 2021-11-04 ENCOUNTER — Inpatient Hospital Stay (HOSPITAL_BASED_OUTPATIENT_CLINIC_OR_DEPARTMENT_OTHER): Payer: Medicare Other | Admitting: Oncology

## 2021-11-04 ENCOUNTER — Other Ambulatory Visit: Payer: Self-pay | Admitting: Oncology

## 2021-11-04 ENCOUNTER — Inpatient Hospital Stay: Payer: Medicare Other | Admitting: Nutrition

## 2021-11-04 ENCOUNTER — Telehealth: Payer: Self-pay

## 2021-11-04 ENCOUNTER — Other Ambulatory Visit: Payer: Self-pay

## 2021-11-04 ENCOUNTER — Inpatient Hospital Stay: Payer: Medicare Other

## 2021-11-04 ENCOUNTER — Encounter (HOSPITAL_COMMUNITY): Payer: Self-pay | Admitting: Gastroenterology

## 2021-11-04 VITALS — BP 101/61 | HR 76 | Temp 98.3°F | Resp 18

## 2021-11-04 VITALS — BP 116/59 | HR 78 | Temp 98.1°F | Resp 20 | Ht 69.0 in | Wt 127.6 lb

## 2021-11-04 DIAGNOSIS — D696 Thrombocytopenia, unspecified: Secondary | ICD-10-CM

## 2021-11-04 DIAGNOSIS — C250 Malignant neoplasm of head of pancreas: Secondary | ICD-10-CM

## 2021-11-04 DIAGNOSIS — Z5111 Encounter for antineoplastic chemotherapy: Secondary | ICD-10-CM | POA: Diagnosis not present

## 2021-11-04 DIAGNOSIS — Z95828 Presence of other vascular implants and grafts: Secondary | ICD-10-CM

## 2021-11-04 DIAGNOSIS — C259 Malignant neoplasm of pancreas, unspecified: Secondary | ICD-10-CM

## 2021-11-04 LAB — CMP (CANCER CENTER ONLY)
ALT: 44 U/L (ref 0–44)
AST: 27 U/L (ref 15–41)
Albumin: 3.7 g/dL (ref 3.5–5.0)
Alkaline Phosphatase: 271 U/L — ABNORMAL HIGH (ref 38–126)
Anion gap: 9 (ref 5–15)
BUN: 11 mg/dL (ref 8–23)
CO2: 20 mmol/L — ABNORMAL LOW (ref 22–32)
Calcium: 8.9 mg/dL (ref 8.9–10.3)
Chloride: 109 mmol/L (ref 98–111)
Creatinine: 1.15 mg/dL (ref 0.61–1.24)
GFR, Estimated: >60 mL/min (ref >60)
Glucose, Bld: 128 mg/dL — ABNORMAL HIGH (ref 70–99)
Potassium: 3.8 mmol/L (ref 3.5–5.1)
Sodium: 138 mmol/L (ref 135–145)
Total Bilirubin: 1.2 mg/dL (ref 0.3–1.2)
Total Protein: 6.4 g/dL — ABNORMAL LOW (ref 6.5–8.1)

## 2021-11-04 LAB — CBC WITH DIFFERENTIAL (CANCER CENTER ONLY)
Abs Immature Granulocytes: 0.09 10*3/uL — ABNORMAL HIGH (ref 0.00–0.07)
Basophils Absolute: 0 10*3/uL (ref 0.0–0.1)
Basophils Relative: 0 %
Eosinophils Absolute: 0.1 10*3/uL (ref 0.0–0.5)
Eosinophils Relative: 1 %
HCT: 29.1 % — ABNORMAL LOW (ref 39.0–52.0)
Hemoglobin: 9.4 g/dL — ABNORMAL LOW (ref 13.0–17.0)
Immature Granulocytes: 1 %
Lymphocytes Relative: 8 %
Lymphs Abs: 0.6 10*3/uL — ABNORMAL LOW (ref 0.7–4.0)
MCH: 30.2 pg (ref 26.0–34.0)
MCHC: 32.3 g/dL (ref 30.0–36.0)
MCV: 93.6 fL (ref 80.0–100.0)
Monocytes Absolute: 0.4 10*3/uL (ref 0.1–1.0)
Monocytes Relative: 5 %
Neutro Abs: 6.7 10*3/uL (ref 1.7–7.7)
Neutrophils Relative %: 85 %
Platelet Count: 110 10*3/uL — ABNORMAL LOW (ref 150–400)
RBC: 3.11 MIL/uL — ABNORMAL LOW (ref 4.22–5.81)
RDW: 18.8 % — ABNORMAL HIGH (ref 11.5–15.5)
WBC Count: 7.9 10*3/uL (ref 4.0–10.5)
nRBC: 0 % (ref 0.0–0.2)

## 2021-11-04 LAB — SAMPLE TO BLOOD BANK

## 2021-11-04 MED ORDER — ROMIPLOSTIM 125 MCG ~~LOC~~ SOLR
2.0000 ug/kg | Freq: Once | SUBCUTANEOUS | Status: AC
  Administered 2021-11-04: 10:00:00 115 ug via SUBCUTANEOUS
  Filled 2021-11-04: qty 0.23

## 2021-11-04 MED ORDER — SODIUM CHLORIDE 0.9 % IV SOLN: Freq: Once | INTRAVENOUS | Status: AC

## 2021-11-04 MED ORDER — PANTOPRAZOLE SODIUM 40 MG PO TBEC: 40.0000 mg | DELAYED_RELEASE_TABLET | Freq: Two times a day (BID) | ORAL | 0 refills | Status: DC

## 2021-11-04 MED ORDER — PROPRANOLOL HCL 10 MG PO TABS: 10.0000 mg | ORAL_TABLET | Freq: Two times a day (BID) | ORAL | 0 refills | Status: DC

## 2021-11-04 MED ORDER — SODIUM CHLORIDE 0.9 % IV SOLN
1800.0000 mg/m2 | INTRAVENOUS | Status: DC
  Administered 2021-11-04: 13:00:00 3050 mg via INTRAVENOUS
  Filled 2021-11-04: qty 61

## 2021-11-04 MED ORDER — MAGNESIUM OXIDE 400 MG PO TABS: 1.0000 | ORAL_TABLET | Freq: Two times a day (BID) | ORAL | 0 refills | Status: DC

## 2021-11-04 MED ORDER — PALONOSETRON HCL INJECTION 0.25 MG/5ML
0.2500 mg | Freq: Once | INTRAVENOUS | Status: AC
  Administered 2021-11-04: 09:00:00 0.25 mg via INTRAVENOUS
  Filled 2021-11-04: qty 5

## 2021-11-04 MED ORDER — SODIUM CHLORIDE 0.9 % IV SOLN
10.0000 mg | Freq: Once | INTRAVENOUS | Status: AC
  Administered 2021-11-04: 10:00:00 10 mg via INTRAVENOUS
  Filled 2021-11-04: qty 1

## 2021-11-04 MED ORDER — SODIUM CHLORIDE 0.9 % IV SOLN
300.0000 mg/m2 | Freq: Once | INTRAVENOUS | Status: AC
  Administered 2021-11-04: 12:00:00 508 mg via INTRAVENOUS
  Filled 2021-11-04: qty 25.4

## 2021-11-04 MED ORDER — SODIUM CHLORIDE 0.9 % IV SOLN
40.0000 mg/m2 | Freq: Once | INTRAVENOUS | Status: AC
  Administered 2021-11-04: 10:00:00 68.8 mg via INTRAVENOUS
  Filled 2021-11-04: qty 16

## 2021-11-04 NOTE — Telephone Encounter (Signed)
Patient seen by Dr. Sherrill today ? ?Vitals are within treatment parameters. ? ?Labs reviewed by Dr. Sherrill and are within treatment parameters. ? ?Per physician team, patient is ready for treatment and there are NO modifications to the treatment plan.  ?

## 2021-11-04 NOTE — Patient Instructions (Addendum)
[facility]   Discharge Instructions: Thank you for choosing [facility] to provide your oncology and hematology care.   If you have a lab appointment with the [facility], please go directly to the [facility] and check in at the registration area.   Wear comfortable clothing and clothing appropriate for easy access to any Portacath or PICC line.   We strive to give you quality time with your provider. You may need to reschedule your appointment if you arrive late (15 or more minutes).  Arriving late affects you and other patients whose appointments are after yours.  Also, if you miss three or more appointments without notifying the office, you may be dismissed from the clinic at the providers discretion.      For prescription refill requests, have your pharmacy contact our office and allow 72 hours for refills to be completed.    Today you received the following chemotherapy and/or immunotherapy agents Irinotecan Liposome (ONIVYDE), Leucovorin & Flourouracil (ADRUCIL).      To help prevent nausea and vomiting after your treatment, we encourage you to take your nausea medication as directed.  BELOW ARE SYMPTOMS THAT SHOULD BE REPORTED IMMEDIATELY: *FEVER GREATER THAN 100.4 F (38 C) OR HIGHER *CHILLS OR SWEATING *NAUSEA AND VOMITING THAT IS NOT CONTROLLED WITH YOUR NAUSEA MEDICATION *UNUSUAL SHORTNESS OF BREATH *UNUSUAL BRUISING OR BLEEDING *URINARY PROBLEMS (pain or burning when urinating, or frequent urination) *BOWEL PROBLEMS (unusual diarrhea, constipation, pain near the anus) TENDERNESS IN MOUTH AND THROAT WITH OR WITHOUT PRESENCE OF ULCERS (sore throat, sores in mouth, or a toothache) UNUSUAL RASH, SWELLING OR PAIN  UNUSUAL VAGINAL DISCHARGE OR ITCHING   Items with * indicate a potential emergency and should be followed up as soon as possible or go to the Emergency Department if any problems should occur.  Please show the  CHEMOTHERAPY ALERT CARD or IMMUNOTHERAPY ALERT CARD at check-in to the Emergency Department and triage nurse.  Should you have questions after your visit or need to cancel or reschedule your appointment, please contact [facility]  Dept: [phone]  and follow the prompts.  Office hours are 8:00 a.m. to 4:30 p.m. Monday - Friday. Please note that voicemails left after 4:00 p.m. may not be returned until the following business day.  We are closed weekends and major holidays. You have access to a nurse at all times for urgent questions. Please call the main number to the clinic Dept: [phone] and follow the prompts.   For any non-urgent questions, you may also contact your provider using MyChart. We now offer e-Visits for anyone 10 and older to request care online for non-urgent symptoms. For details visit [email or web address].   Also download the MyChart app! Go to the app store, search "MyChart", open the app, select [facility], and log in with your MyChart username and password.  Due to Covid, a mask is required upon entering the hospital/clinic. If you do not have a mask, one will be given to you upon arrival. For doctor visits, patients may have 1 support person aged 19 or older with them. For treatment visits, patients cannot have anyone with them due to current Covid guidelines and our immunocompromised population.   Irinotecan Liposomal injection What is this medication? IRINOTECAN LIPOSOME (eye ri noe TEE kan LIP oh som) is a chemotherapy drug. It is used to treat pancreatic cancer. This medicine may be used for other purposes; ask your health care provider or  pharmacist if you have questions. COMMON BRAND NAME(S): ONIVYDE What should I tell my care team before I take this medication? They need to know if you have any of these conditions: bleeding disorders dehydration history of blood diseases, like sickle cell anemia or leukemia history of low levels  of calcium, magnesium, or potassium in the blood infection (especially a virus infection such as chickenpox, cold sores, or herpes) liver disease low blood counts, like low white cell, platelet, or red cell counts lung or breathing disease, like asthma recent or ongoing radiation therapy an unusual or allergic reaction to irinotecan liposome, other medicines, foods, dyes, or preservatives pregnant or trying to get pregnant breast-feeding How should I use this medication? This drug is given as an infusion into a vein. It is administered in a hospital or clinic by a specially trained health care professional. Talk to your pediatrician regarding the use of this medicine in children. Special care may be needed. Overdosage: If you think you have taken too much of this medicine contact a poison control center or emergency room at once. NOTE: This medicine is only for you. Do not share this medicine with others. What if I miss a dose? It is important not to miss your dose. Call your doctor or health care professional if you are unable to keep an appointment. What may interact with this medication? This medicine may interact with the following medications: antiviral medicines for HIV or AIDS certain medications for fungal infections like ketoconazole, itraconazole, voriconazole certain medications for seizures like carbamazepine, fosphenytoin, phenytoin clarithromycin gemfibrozil mephobarbital nefazodone phenobarbital primidone rifabutin rifampin rifapentine [facility]. John's Wort telaprevir This list may not describe all possible interactions. Give your health care provider a list of all the medicines, herbs, non-prescription drugs, or dietary supplements you use. Also tell them if you smoke, drink alcohol, or use illegal drugs. Some items may interact with your medicine. What should I watch for while using this medication? Check with your doctor or health care professional if you get an attack  of severe diarrhea, nausea and vomiting, or if you sweat a lot. The loss of too much body fluid can make it dangerous for you to take this medicine. This medicine may interfere with the ability to have a child. You should talk with your doctor or health care professional if you are concerned about your fertility. Do not become pregnant while taking this medicine or for 1 month after the last dose; males with male partners should use condoms during treatment and for 4 months after the last dose. Women should inform their doctor if they wish to become pregnant or think they might be pregnant. There is a potential for serious side effects to an unborn child. Talk to your health care professional or pharmacist for more information. Do not breast-feed an infant while taking this medicine or for 1 month after the last dose. Avoid taking products that contain aspirin, acetaminophen, ibuprofen, naproxen, or ketoprofen unless instructed by your doctor. These medicines may hide a fever. Be careful brushing and flossing your teeth or using a toothpick because you may get an infection or bleed more easily. If you have any dental work done, tell your dentist you are receiving this medicine. Call your doctor or health care professional for advice if you get a fever, chills or sore throat, or other symptoms of a cold or flu. Do not treat yourself. This drug decreases your body's ability to fight infections. Try to avoid being around people who are  sick. This medicine may increase your risk to bruise or bleed. Call your doctor or health care professional if you notice any unusual bleeding. This drug may make you feel generally unwell. This is not uncommon, as chemotherapy can affect healthy cells as well as cancer cells. Report any side effects. Continue your course of treatment even though you feel ill unless your doctor tells you to stop. What side effects may I notice from receiving this medication? Side effects that  you should report to your doctor or health care professional as soon as possible: allergic reactions like skin rash, itching or hives, swelling of the face, lips, or tongue breathing problems cough diarrhea low blood counts - this medicine may decrease the number of white blood cells, red blood cells and platelets. You may be at increased risk for infections and bleeding nausea, vomiting signs and symptoms of bleeding such as bloody or black, tarry stools; red or dark-brown urine; spitting up blood or brown material that looks like coffee grounds; red spots on the skin; unusual bruising or bleeding from the eye, gums, or nose signs of decreased red blood cells - unusually weak or tired, feeling faint or lightheaded, falls signs and symptoms of infection like fever or chills; cough; sore throat; pain or trouble passing urine signs and symptoms of liver injury like dark yellow or brown urine; general ill feeling or flu-like symptoms; light-colored stools; loss of appetite; nausea; right upper belly pain; unusually weak or tired; yellowing of the eyes or skin stomach pain Side effects that usually do not require medical attention (report to your doctor or health care professional if they continue or are bothersome): hair loss loss of appetite mouth sores upset stomach This list may not describe all possible side effects. Call your doctor for medical advice about side effects. You may report side effects to FDA at 1-800-FDA-1088. Where should I keep my medication? This drug is given in a hospital or clinic and will not be stored at home. NOTE: This sheet is a summary. It may not cover all possible information. If you have questions about this medicine, talk to your doctor, pharmacist, or health care provider.  2022 Elsevier/Gold Standard (2015-12-04 00:00:00)  Leucovorin injection What is this medication? LEUCOVORIN (loo koe VOR in) is used to prevent or treat the harmful effects of some  medicines. This medicine is used to treat anemia caused by a low amount of folic acid in the body. It is also used with 5-fluorouracil (5-FU) to treat colon cancer. This medicine may be used for other purposes; ask your health care provider or pharmacist if you have questions. What should I tell my care team before I take this medication? They need to know if you have any of these conditions: anemia from low levels of vitamin B-12 in the blood an unusual or allergic reaction to leucovorin, folic acid, other medicines, foods, dyes, or preservatives pregnant or trying to get pregnant breast-feeding How should I use this medication? This medicine is for injection into a muscle or into a vein. It is given by a health care professional in a hospital or clinic setting. Talk to your pediatrician regarding the use of this medicine in children. Special care may be needed. Overdosage: If you think you have taken too much of this medicine contact a poison control center or emergency room at once. NOTE: This medicine is only for you. Do not share this medicine with others. What if I miss a dose? This does not apply.  What may interact with this medication? capecitabine fluorouracil phenobarbital phenytoin primidone trimethoprim-sulfamethoxazole This list may not describe all possible interactions. Give your health care provider a list of all the medicines, herbs, non-prescription drugs, or dietary supplements you use. Also tell them if you smoke, drink alcohol, or use illegal drugs. Some items may interact with your medicine. What should I watch for while using this medication? Your condition will be monitored carefully while you are receiving this medicine. This medicine may increase the side effects of 5-fluorouracil, 5-FU. Tell your doctor or health care professional if you have diarrhea or mouth sores that do not get better or that get worse. What side effects may I notice from receiving this  medication? Side effects that you should report to your doctor or health care professional as soon as possible: allergic reactions like skin rash, itching or hives, swelling of the face, lips, or tongue breathing problems fever, infection mouth sores unusual bleeding or bruising unusually weak or tired Side effects that usually do not require medical attention (report to your doctor or health care professional if they continue or are bothersome): constipation or diarrhea loss of appetite nausea, vomiting This list may not describe all possible side effects. Call your doctor for medical advice about side effects. You may report side effects to FDA at 1-800-FDA-1088. Where should I keep my medication? This drug is given in a hospital or clinic and will not be stored at home. NOTE: This sheet is a summary. It may not cover all possible information. If you have questions about this medicine, talk to your doctor, pharmacist, or health care provider.  2022 Elsevier/Gold Standard (2008-05-09 00:00:00)  Fluorouracil, 5-FU injection What is this medication? FLUOROURACIL, 5-FU (flure oh YOOR a sil) is a chemotherapy drug. It slows the growth of cancer cells. This medicine is used to treat many types of cancer like breast cancer, colon or rectal cancer, pancreatic cancer, and stomach cancer. This medicine may be used for other purposes; ask your health care provider or pharmacist if you have questions. COMMON BRAND NAME(S): Adrucil What should I tell my care team before I take this medication? They need to know if you have any of these conditions: blood disorders dihydropyrimidine dehydrogenase (DPD) deficiency infection (especially a virus infection such as chickenpox, cold sores, or herpes) kidney disease liver disease malnourished, poor nutrition recent or ongoing radiation therapy an unusual or allergic reaction to fluorouracil, other chemotherapy, other medicines, foods, dyes, or  preservatives pregnant or trying to get pregnant breast-feeding How should I use this medication? This drug is given as an infusion or injection into a vein. It is administered in a hospital or clinic by a specially trained health care professional. Talk to your pediatrician regarding the use of this medicine in children. Special care may be needed. Overdosage: If you think you have taken too much of this medicine contact a poison control center or emergency room at once. NOTE: This medicine is only for you. Do not share this medicine with others. What if I miss a dose? It is important not to miss your dose. Call your doctor or health care professional if you are unable to keep an appointment. What may interact with this medication? Do not take this medicine with any of the following medications: live virus vaccines This medicine may also interact with the following medications: medicines that treat or prevent blood clots like warfarin, enoxaparin, and dalteparin This list may not describe all possible interactions. Give your health care provider  a list of all the medicines, herbs, non-prescription drugs, or dietary supplements you use. Also tell them if you smoke, drink alcohol, or use illegal drugs. Some items may interact with your medicine. What should I watch for while using this medication? Visit your doctor for checks on your progress. This drug may make you feel generally unwell. This is not uncommon, as chemotherapy can affect healthy cells as well as cancer cells. Report any side effects. Continue your course of treatment even though you feel ill unless your doctor tells you to stop. In some cases, you may be given additional medicines to help with side effects. Follow all directions for their use. Call your doctor or health care professional for advice if you get a fever, chills or sore throat, or other symptoms of a cold or flu. Do not treat yourself. This drug decreases your body's  ability to fight infections. Try to avoid being around people who are sick. This medicine may increase your risk to bruise or bleed. Call your doctor or health care professional if you notice any unusual bleeding. Be careful brushing and flossing your teeth or using a toothpick because you may get an infection or bleed more easily. If you have any dental work done, tell your dentist you are receiving this medicine. Avoid taking products that contain aspirin, acetaminophen, ibuprofen, naproxen, or ketoprofen unless instructed by your doctor. These medicines may hide a fever. Do not become pregnant while taking this medicine. Women should inform their doctor if they wish to become pregnant or think they might be pregnant. There is a potential for serious side effects to an unborn child. Talk to your health care professional or pharmacist for more information. Do not breast-feed an infant while taking this medicine. Men should inform their doctor if they wish to father a child. This medicine may lower sperm counts. Do not treat diarrhea with over the counter products. Contact your doctor if you have diarrhea that lasts more than 2 days or if it is severe and watery. This medicine can make you more sensitive to the sun. Keep out of the sun. If you cannot avoid being in the sun, wear protective clothing and use sunscreen. Do not use sun lamps or tanning beds/booths. What side effects may I notice from receiving this medication? Side effects that you should report to your doctor or health care professional as soon as possible: allergic reactions like skin rash, itching or hives, swelling of the face, lips, or tongue low blood counts - this medicine may decrease the number of white blood cells, red blood cells and platelets. You may be at increased risk for infections and bleeding. signs of infection - fever or chills, cough, sore throat, pain or difficulty passing urine signs of decreased platelets or  bleeding - bruising, pinpoint red spots on the skin, black, tarry stools, blood in the urine signs of decreased red blood cells - unusually weak or tired, fainting spells, lightheadedness breathing problems changes in vision chest pain mouth sores nausea and vomiting pain, swelling, redness at site where injected pain, tingling, numbness in the hands or feet redness, swelling, or sores on hands or feet stomach pain unusual bleeding Side effects that usually do not require medical attention (report to your doctor or health care professional if they continue or are bothersome): changes in finger or toe nails diarrhea dry or itchy skin hair loss headache loss of appetite sensitivity of eyes to the light stomach upset unusually teary eyes This list may  not describe all possible side effects. Call your doctor for medical advice about side effects. You may report side effects to FDA at 1-800-FDA-1088. Where should I keep my medication? This drug is given in a hospital or clinic and will not be stored at home. NOTE: This sheet is a summary. It may not cover all possible information. If you have questions about this medicine, talk to your doctor, pharmacist, or health care provider.  2022 Elsevier/Gold Standard (2021-07-21 00:00:00)  The chemotherapy medication bag should finish at 46 hours, 96 hours, or 7 days. For example, if your pump is scheduled for 46 hours and it was put on at 4:00 p.m., it should finish at 2:00 p.m. the day it is scheduled to come off regardless of your appointment time.     Estimated time to finish at 10:30 a.m. on Friday 11/06/21.   If the display on your pump reads "Low Volume" and it is beeping, take the batteries out of the pump and come to the cancer center for it to be taken off.   If the pump alarms go off prior to the pump reading "Low Volume" then call [phone] and someone can assist you.  If the plunger comes out and the chemotherapy medication  is leaking out, please use your home chemo spill kit to clean up the spill. Do NOT use paper towels or other household products.  If you have problems or questions regarding your pump, please call either [phone] (24 hours a day) or the cancer center Monday-Friday 8:00 a.m.- 4:30 p.m. at the clinic number and we will assist you. If you are unable to get assistance, then go to the nearest Emergency Department and ask the staff to contact the IV team for assistance.   Romiplostim injection What is this medication? ROMIPLOSTIM (roe mi PLOE stim) helps your body make more platelets. This medicine is used to treat low platelets caused by chronic idiopathic thrombocytopenic purpura (ITP) or a bone marrow syndrome caused by radiation sickness. This medicine may be used for other purposes; ask your health care provider or pharmacist if you have questions. COMMON BRAND NAME(S): Nplate What should I tell my care team before I take this medication? They need to know if you have any of these conditions: blood clots myelodysplastic syndrome an unusual or allergic reaction to romiplostim, mannitol, other medicines, foods, dyes, or preservatives pregnant or trying to get pregnant breast-feeding How should I use this medication? This medicine is injected under the skin. It is given by a health care provider in a hospital or clinic setting. A special MedGuide will be given to you before each treatment. Be sure to read this information carefully each time. Talk to your health care provider about the use of this medicine in children. While it may be prescribed for children as young as newborns for selected conditions, precautions do apply. Overdosage: If you think you have taken too much of this medicine contact a poison control center or emergency room at once. NOTE: This medicine is only for you. Do not share this medicine with others. What if I miss a dose? Keep appointments for follow-up doses. It is  important not to miss your dose. Call your health care provider if you are unable to keep an appointment. What may interact with this medication? Interactions are not expected. This list may not describe all possible interactions. Give your health care provider a list of all the medicines, herbs, non-prescription drugs, or dietary supplements you use. Also tell  them if you smoke, drink alcohol, or use illegal drugs. Some items may interact with your medicine. What should I watch for while using this medication? Visit your health care provider for regular checks on your progress. You may need blood work done while you are taking this medicine. Your condition will be monitored carefully while you are receiving this medicine. It is important not to miss any appointments. What side effects may I notice from receiving this medication? Side effects that you should report to your doctor or health care professional as soon as possible: allergic reactions (skin rash, itching or hives; swelling of the face, lips, or tongue) bleeding (bloody or black, tarry stools; red or dark brown urine; spitting up blood or brown material that looks like coffee grounds; red spots on the skin; unusual bruising or bleeding from the eyes, gums, or nose) blood clot (chest pain; shortness of breath; pain, swelling, or warmth in the leg) stroke (changes in vision; confusion; trouble speaking or understanding; severe headaches; sudden numbness or weakness of the face, arm or leg; trouble walking; dizziness; loss of balance or coordination) Side effects that usually do not require medical attention (report to your doctor or health care professional if they continue or are bothersome): diarrhea dizziness headache joint pain muscle pain stomach pain trouble sleeping This list may not describe all possible side effects. Call your doctor for medical advice about side effects. You may report side effects to FDA at  1-800-FDA-1088. Where should I keep my medication? This medicine is given in a hospital or clinic. It will not be stored at home. NOTE: This sheet is a summary. It may not cover all possible information. If you have questions about this medicine, talk to your doctor, pharmacist, or health care provider.  2022 Elsevier/Gold Standard (2021-07-21 00:00:00)

## 2021-11-04 NOTE — Patient Instructions (Signed)

## 2021-11-04 NOTE — Progress Notes (Signed)
[facility] OFFICE PROGRESS NOTE   Diagnosis: Pancreas cancer  INTERVAL HISTORY:   Mr. Capell completed another cycle of 5-FU and liposomal irinotecan on 10/20/2021.  He received G-CSF following chemotherapy.  He continues weekly Nplate.  No bleeding.  No pain.  He reports dyspnea requiring him to breathe through his mouth when he works outside.  He underwent an upper endoscopy yesterday.  Varices with no bleeding or stigmata of recent bleeding were found in the mid and distal esophagus.  The varices appears smaller.  Bands were placed and the varices were eradicated.  There were small gastric varices and mild portal hypertensive gastropathy.  Objective:  Vital signs in last 24 hours:  Blood pressure (!) 116/59, pulse 78, temperature 98.1 F (36.7 C), temperature source Oral, resp. rate 20, height _0  (1.753 m), weight 127 lb 9.6 oz (57.9 kg), SpO2 100 %.    HEENT: No thrush or ulcers Resp: Lungs clear bilaterally Cardio: Regular rate and rhythm GI: Nontender, no mass, no hepatosplenomegaly Vascular: No leg edema    Portacath/PICC-without erythema  Lab Results:  Lab Results  Component Value Date   WBC 7.9 11/04/2021   HGB 9.4 (L) 11/04/2021   HCT 29.1 (L) 11/04/2021   MCV 93.6 11/04/2021   PLT 110 (L) 11/04/2021   NEUTROABS 6.7 11/04/2021    CMP  Lab Results  Component Value Date   NA 138 11/04/2021   K 3.8 11/04/2021   CL 109 11/04/2021   CO2 20 (L) 11/04/2021   GLUCOSE 128 (H) 11/04/2021   BUN 11 11/04/2021   CREATININE 1.15 11/04/2021   CALCIUM 8.9 11/04/2021   PROT 6.4 (L) 11/04/2021   ALBUMIN 3.7 11/04/2021   AST 27 11/04/2021   ALT 44 11/04/2021   ALKPHOS 271 (H) 11/04/2021   BILITOT 1.2 11/04/2021   GFRNONAA >60 11/04/2021   GFRAA NOT CALCULATED 02/23/2021    Lab Results  Component Value Date   KTG256 8,807 (H) 10/13/2021     Medications: I have reviewed the patient's current medications.   Assessment/Plan: Pancreas  cancer CT urology center 03/04/2020-haziness of the fat adjacent to the celiac axis and SMA with focal narrowing of the SMV, splenic vein, and splenoportal confluence CT 07/07/2020-infiltrative retroperitoneal mass with encasement of the celiac axis and SMA, high-grade narrowing of the proximal portal vein with cavernous transformation, nonocclusive thrombus within the main portal vein, mild biliary ductal dilatation, diffuse hypodense/hypoenhancing areas within the liver (edema versus infiltrating neoplasm), 2 x 1.7 cm area of focal prominence in the pancreas EUS 07/10/2020-no pancreas mass identified, 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, 1.5 cm aortocaval node biopsy-scant lymphoid material, no malignancy Diagnostic laparoscopy 07/17/2020-segment 3 and 4 liver nodules, adenocarcinoma, positive for pankeratin, CK7 and CK20, partially positive for TTF-1, negative for CDX2 Foundation 1-microsatellite stable, tumor mutation burden 4, K-ras G12D, subclonal RB1 alteration Elevated CA 19-9 Cycle 1 FOLFOX 07/28/2020 Cycle 2 FOLFOX 08/18/2020, oxaliplatin dose reduced due to thrombocytopenia Cycle 3 FOLFOX 09/08/2020 Cycle 4 FOLFOX 09/29/2020  Cycle 5 FOLFOX 10/20/2020 CTs 11/06/2020-grossly stable infiltrative mass within the pancreatic head and porta hepatis.  New small low-density liver lesions.  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 Cycle 6 FOLFOX 11/10/2021  Cycle 7 FOLFOX 12/01/2020 MRI liver 12/04/2020-no significant change in posttreatment appearance of the pancreatic head.  Numerous intrinsically low signal hypoenhancing lesions of the liver parenchyma predominantly observed  in the right lobe of the liver and liver dome, measuring no greater than 8 mm and as seen on recent prior CT of the abdomen/pelvis. Cycle 8 FOLFOX 12/22/2020 Cycle 9 FOLFOX 01/12/2021 Cycle 10  FOLFOX 02/02/2021 MRI liver 02/19/2021-no change in pancreas head mass, multiple hypoenhancing liver lesions are new and increased in size, effacement of portal vein by pancreas head mass with cavernous transformation, no change in mild splenomegaly Cycle 1 gemcitabine/Abraxane 03/12/2021 Cycle 2 gemcitabine/Abraxane 03/27/2021- dose reductions secondary to neutropenia Cycle 3 gemcitabine/Abraxane 04/17/2021-dose reductions due to thrombocytopenia, white cell growth factor support added Cycle 4 gemcitabine/Abraxane 05/01/2021, Ziextenzo Cycle 5 gemcitabine/Abraxane 05/15/2021, Ziextenzo MRI abdomen 05/26/2021-mild enlargement of dominant hepatic metastases, appearance of infiltrative pancreas mass extending to the celiac trunk and SMA, splenic vein occlusion, cavernous transformation of the portal vein with upper abdomen collaterals, splenomegaly, mild upper abdominal ascites and mesenteric edema, minimal subpleural nodularity left lower lobe Cycle 6 gemcitabine/Abraxane 05/28/2021 Cycle 7 gemcitabine/Abraxane 06/12/2021 Cycle 8 gemcitabine/Abraxane 06/26/2021 Cycle 9 gemcitabine/Abraxane 07/10/2021 Cycle 10 gemcitabine/Abraxane 07/23/2021 Cycle 11 gemcitabine/Abraxane 08/06/2021 MRI abdomen 08/20/2021-multifocal nodularity in the lower chest with a new discrete right lower lobe nodule, enlarging subsegment 7/8 liver lesion, other small lesions appear stable, gastric varices, review of images with radiology-enlargement of at least 2 liver lesions, suspicious nodule n the right lower lung Cycle 1  5-FU/liposomal irinotecan 09/08/2021 Cycle 2  5-FU/liposomal irinotecan 09/22/2021 Cycle 3  5-FU/liposomal irinotecan 10/20/2021 Cycle 4 5-FU/liposomal irinotecan 11/04/2021   Chronic thrombocytopenia- weekly Nplate beginning 7/73/7505 Diabetes Hypertension Abdomen/back pain secondary to #1 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   Admission with recurrent GI bleeding and severe anemia 09/15/2021 Upper endoscopy 09/17/2021-esophageal varices with stigmata of recent bleeding, banded.  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  Influenza A 10/13/2021        Disposition: Aaron Johnston appears stable.  He will complete another treatment with 5-FU and liposomal irinotecan today.  He will receive G-CSF support following chemotherapy.  He continues weekly Nplate.  He will return for an office visit in the next cycle of chemotherapy in 2 weeks.  He will be referred for a restaging CT evaluation after the next cycle of chemotherapy.  We will follow-up on the CA 19-9 from today.  Betsy Coder, MD  11/04/2021  8:59 AM

## 2021-11-04 NOTE — Progress Notes (Signed)
Patient presents for treatment. RN assessment completed along with the following:  Labs/vitals reviewed - Yes, and within treatment parameters.   Weight within 10% of previous measurement - Yes Oncology Treatment Attestation completed for current therapy- Yes, on date 09/01/21 Informed consent completed and reflects current therapy/intent - Yes, on date 09/08/21             Provider progress note reviewed - Today's provider note is not yet available. I reviewed the most recent oncology provider progress note in chart dated 10/19/21. Treatment/Antibody/Supportive plan reviewed - Yes, and there are no adjustments needed for today's treatment. S&H and other orders reviewed - Yes, and there are no additional orders identified. Previous treatment date reviewed - Yes, and the appropriate amount of time has elapsed between treatments.  Patient to proceed with treatment.

## 2021-11-04 NOTE — Progress Notes (Signed)
Brief nutrition follow up completed with patient during infusion for pancreas cancer. He is followed by Dr. Benay Spice.  Current weight documented as 127 pounds which is improved from 126 pounds November 23.   Labs noted.  S/P esophageal band ligation due to esophageal varices.  Patient reports he feels much better and is able to eat. Reports diarrhea is improved.He has no questions or concerns and has no nutrition needs at this time.  Severe Malnutrition ongoing but improving.  Will continue to follow patient as needed and am available to see sooner if needed.

## 2021-11-05 LAB — CANCER ANTIGEN 19-9: CA 19-9: 5760 U/mL — ABNORMAL HIGH (ref 0–35)

## 2021-11-06 ENCOUNTER — Other Ambulatory Visit: Payer: Self-pay

## 2021-11-06 ENCOUNTER — Inpatient Hospital Stay: Payer: Medicare Other

## 2021-11-06 VITALS — BP 107/58 | HR 69 | Temp 98.1°F | Resp 20

## 2021-11-06 DIAGNOSIS — C250 Malignant neoplasm of head of pancreas: Secondary | ICD-10-CM

## 2021-11-06 DIAGNOSIS — Z5111 Encounter for antineoplastic chemotherapy: Secondary | ICD-10-CM | POA: Diagnosis not present

## 2021-11-06 MED ORDER — PEGFILGRASTIM-BMEZ 6 MG/0.6ML ~~LOC~~ SOSY
6.0000 mg | PREFILLED_SYRINGE | Freq: Once | SUBCUTANEOUS | Status: AC
  Administered 2021-11-06: 11:00:00 6 mg via SUBCUTANEOUS

## 2021-11-06 MED ORDER — SODIUM CHLORIDE 0.9% FLUSH
10.0000 mL | INTRAVENOUS | Status: DC | PRN
  Administered 2021-11-06: 11:00:00 10 mL

## 2021-11-06 MED ORDER — HEPARIN SOD (PORK) LOCK FLUSH 100 UNIT/ML IV SOLN
500.0000 [IU] | Freq: Once | INTRAVENOUS | Status: AC | PRN
  Administered 2021-11-06: 11:00:00 500 [IU]

## 2021-11-06 NOTE — Patient Instructions (Signed)
[facility]  Discharge Instructions: Thank you for choosing [facility] to provide your oncology and hematology care.   If you have a lab appointment with the [facility], please go directly to the [facility] and check in at the registration area.   Wear comfortable clothing and clothing appropriate for easy access to any Portacath or PICC line.   We strive to give you quality time with your provider. You may need to reschedule your appointment if you arrive late (15 or more minutes).  Arriving late affects you and other patients whose appointments are after yours.  Also, if you miss three or more appointments without notifying the office, you may be dismissed from the clinic at the providers discretion.      For prescription refill requests, have your pharmacy contact our office and allow 72 hours for refills to be completed.    Today you received the following chemotherapy and/or immunotherapy agents ziextenzo  Pegfilgrastim Injection What is this medication? PEGFILGRASTIM (PEG fil gra stim) lowers the risk of infection in people who are receiving chemotherapy. It works by Building control surveyor make more white blood cells, which protects your body from infection. It may also be used to help people who have been exposed to high doses of radiation. This medicine may be used for other purposes; ask your health care provider or pharmacist if you have questions. COMMON BRAND NAME(S): Rexene Edison, Ziextenzo What should I tell my care team before I take this medication? They need to know if you have any of these conditions: Kidney disease Latex allergy Ongoing radiation therapy Sickle cell disease Skin reactions to acrylic adhesives (On-Body Injector only) An unusual or allergic reaction to pegfilgrastim, filgrastim, other medications, foods, dyes, or preservatives Pregnant or trying to get pregnant Breast-feeding How should  I use this medication? This medication is for injection under the skin. If you get this medication at home, you will be taught how to prepare and give the pre-filled syringe or how to use the On-body Injector. Refer to the patient Instructions for Use for detailed instructions. Use exactly as directed. Tell your care team immediately if you suspect that the On-body Injector may not have performed as intended or if you suspect the use of the On-body Injector resulted in a missed or partial dose. It is important that you put your used needles and syringes in a special sharps container. Do not put them in a trash can. If you do not have a sharps container, call your pharmacist or care team to get one. Talk to your care team about the use of this medication in children. While this medication may be prescribed for selected conditions, precautions do apply. Overdosage: If you think you have taken too much of this medicine contact a poison control center or emergency room at once. NOTE: This medicine is only for you. Do not share this medicine with others. What if I miss a dose? It is important not to miss your dose. Call your care team if you miss your dose. If you miss a dose due to an On-body Injector failure or leakage, a new dose should be administered as soon as possible using a single prefilled syringe for manual use. What may interact with this medication? Interactions have not been studied. This list may not describe all possible interactions. Give your health care provider a list of all the medicines, herbs, non-prescription drugs, or dietary supplements you use. Also tell  them if you smoke, drink alcohol, or use illegal drugs. Some items may interact with your medicine. What should I watch for while using this medication? Your condition will be monitored carefully while you are receiving this medication. You may need blood work done while you are taking this medication. Talk to your care team about  your risk of cancer. You may be more at risk for certain types of cancer if you take this medication. If you are going to need a MRI, CT scan, or other procedure, tell your care team that you are using this medication (On-Body Injector only). What side effects may I notice from receiving this medication? Side effects that you should report to your care team as soon as possible: Allergic reactions--skin rash, itching, hives, swelling of the face, lips, tongue, or throat Capillary leak syndrome--stomach or muscle pain, unusual weakness or fatigue, feeling faint or lightheaded, decrease in the amount of urine, swelling of the ankles, hands, or feet, trouble breathing High white blood cell level--fever, fatigue, trouble breathing, night sweats, change in vision, weight loss Inflammation of the aorta--fever, fatigue, back, chest, or stomach pain, severe headache Kidney injury (glomerulonephritis)--decrease in the amount of urine, red or dark brown urine, foamy or bubbly urine, swelling of the ankles, hands, or feet Shortness of breath or trouble breathing Spleen injury--pain in upper left stomach or shoulder Unusual bruising or bleeding Side effects that usually do not require medical attention (report to your care team if they continue or are bothersome): Bone pain Pain in the hands or feet This list may not describe all possible side effects. Call your doctor for medical advice about side effects. You may report side effects to FDA at 1-800-FDA-1088. Where should I keep my medication? Keep out of the reach of children. If you are using this medication at home, you will be instructed on how to store it. Throw away any unused medication after the expiration date on the label. NOTE: This sheet is a summary. It may not cover all possible information. If you have questions about this medicine, talk to your doctor, pharmacist, or health care provider.  2022 Elsevier/Gold Standard (2021-07-21 00:00:00)        To help prevent nausea and vomiting after your treatment, we encourage you to take your nausea medication as directed.  BELOW ARE SYMPTOMS THAT SHOULD BE REPORTED IMMEDIATELY: *FEVER GREATER THAN 100.4 F (38 C) OR HIGHER *CHILLS OR SWEATING *NAUSEA AND VOMITING THAT IS NOT CONTROLLED WITH YOUR NAUSEA MEDICATION *UNUSUAL SHORTNESS OF BREATH *UNUSUAL BRUISING OR BLEEDING *URINARY PROBLEMS (pain or burning when urinating, or frequent urination) *BOWEL PROBLEMS (unusual diarrhea, constipation, pain near the anus) TENDERNESS IN MOUTH AND THROAT WITH OR WITHOUT PRESENCE OF ULCERS (sore throat, sores in mouth, or a toothache) UNUSUAL RASH, SWELLING OR PAIN  UNUSUAL VAGINAL DISCHARGE OR ITCHING   Items with * indicate a potential emergency and should be followed up as soon as possible or go to the Emergency Department if any problems should occur.  Please show the CHEMOTHERAPY ALERT CARD or IMMUNOTHERAPY ALERT CARD at check-in to the Emergency Department and triage nurse.  Should you have questions after your visit or need to cancel or reschedule your appointment, please contact [facility]  Dept: [phone]  and follow the prompts.  Office hours are 8:00 a.m. to 4:30 p.m. Monday - Friday. Please note that voicemails left after 4:00 p.m. may not be returned until the following business day.  We are closed weekends  and major holidays. You have access to a nurse at all times for urgent questions. Please call the main number to the clinic Dept: [phone] and follow the prompts.   For any non-urgent questions, you may also contact your provider using MyChart. We now offer e-Visits for anyone 26 and older to request care online for non-urgent symptoms. For details visit [email or web address].   Also download the MyChart app! Go to the app store, search "MyChart", open the app, select [facility], and log in with your MyChart username and password.  Due to Covid, a  mask is required upon entering the hospital/clinic. If you do not have a mask, one will be given to you upon arrival. For doctor visits, patients may have 1 support person aged 22 or older with them. For treatment visits, patients cannot have anyone with them due to current Covid guidelines and our immunocompromised population.

## 2021-11-10 ENCOUNTER — Inpatient Hospital Stay: Payer: Medicare Other

## 2021-11-10 ENCOUNTER — Other Ambulatory Visit: Payer: Self-pay

## 2021-11-10 VITALS — BP 115/70 | HR 72 | Temp 98.0°F | Resp 18 | Wt 124.4 lb

## 2021-11-10 DIAGNOSIS — C250 Malignant neoplasm of head of pancreas: Secondary | ICD-10-CM

## 2021-11-10 DIAGNOSIS — Z95828 Presence of other vascular implants and grafts: Secondary | ICD-10-CM

## 2021-11-10 DIAGNOSIS — D696 Thrombocytopenia, unspecified: Secondary | ICD-10-CM

## 2021-11-10 DIAGNOSIS — Z5111 Encounter for antineoplastic chemotherapy: Secondary | ICD-10-CM | POA: Diagnosis not present

## 2021-11-10 DIAGNOSIS — C259 Malignant neoplasm of pancreas, unspecified: Secondary | ICD-10-CM

## 2021-11-10 LAB — SAMPLE TO BLOOD BANK

## 2021-11-10 LAB — CBC WITH DIFFERENTIAL (CANCER CENTER ONLY)
Abs Immature Granulocytes: 0.31 10*3/uL — ABNORMAL HIGH (ref 0.00–0.07)
Basophils Absolute: 0 10*3/uL (ref 0.0–0.1)
Basophils Relative: 1 %
Eosinophils Absolute: 0.2 10*3/uL (ref 0.0–0.5)
Eosinophils Relative: 2 %
HCT: 28.1 % — ABNORMAL LOW (ref 39.0–52.0)
Hemoglobin: 8.9 g/dL — ABNORMAL LOW (ref 13.0–17.0)
Immature Granulocytes: 4 %
Lymphocytes Relative: 5 %
Lymphs Abs: 0.4 10*3/uL — ABNORMAL LOW (ref 0.7–4.0)
MCH: 30.3 pg (ref 26.0–34.0)
MCHC: 31.7 g/dL (ref 30.0–36.0)
MCV: 95.6 fL (ref 80.0–100.0)
Monocytes Absolute: 0.1 10*3/uL (ref 0.1–1.0)
Monocytes Relative: 1 %
Neutro Abs: 6.1 10*3/uL (ref 1.7–7.7)
Neutrophils Relative %: 87 %
Platelet Count: 98 10*3/uL — ABNORMAL LOW (ref 150–400)
RBC: 2.94 MIL/uL — ABNORMAL LOW (ref 4.22–5.81)
RDW: 18.8 % — ABNORMAL HIGH (ref 11.5–15.5)
WBC Count: 7 10*3/uL (ref 4.0–10.5)
nRBC: 0 % (ref 0.0–0.2)

## 2021-11-10 MED ORDER — HEPARIN SOD (PORK) LOCK FLUSH 100 UNIT/ML IV SOLN
500.0000 [IU] | Freq: Once | INTRAVENOUS | Status: AC | PRN
  Administered 2021-11-10: 09:00:00 500 [IU]

## 2021-11-10 MED ORDER — ROMIPLOSTIM 125 MCG ~~LOC~~ SOLR
2.0000 ug/kg | Freq: Once | SUBCUTANEOUS | Status: AC
  Administered 2021-11-10: 09:00:00 115 ug via SUBCUTANEOUS
  Filled 2021-11-10: qty 0.23

## 2021-11-10 MED ORDER — SODIUM CHLORIDE 0.9% FLUSH
10.0000 mL | INTRAVENOUS | Status: DC | PRN
  Administered 2021-11-10: 09:00:00 10 mL

## 2021-11-10 NOTE — Patient Instructions (Signed)
[facility]  Discharge Instructions: Thank you for choosing [facility] to provide your oncology and hematology care.   If you have a lab appointment with the [facility], please go directly to the [facility] and check in at the registration area.   Wear comfortable clothing and clothing appropriate for easy access to any Portacath or PICC line.   We strive to give you quality time with your provider. You may need to reschedule your appointment if you arrive late (15 or more minutes).  Arriving late affects you and other patients whose appointments are after yours.  Also, if you miss three or more appointments without notifying the office, you may be dismissed from the clinic at the providers discretion.      For prescription refill requests, have your pharmacy contact our office and allow 72 hours for refills to be completed.    Today you received the following chemotherapy and/or immunotherapy agents n-plate     To help prevent nausea and vomiting after your treatment, we encourage you to take your nausea medication as directed.  BELOW ARE SYMPTOMS THAT SHOULD BE REPORTED IMMEDIATELY: *FEVER GREATER THAN 100.4 F (38 C) OR HIGHER *CHILLS OR SWEATING *NAUSEA AND VOMITING THAT IS NOT CONTROLLED WITH YOUR NAUSEA MEDICATION *UNUSUAL SHORTNESS OF BREATH *UNUSUAL BRUISING OR BLEEDING *URINARY PROBLEMS (pain or burning when urinating, or frequent urination) *BOWEL PROBLEMS (unusual diarrhea, constipation, pain near the anus) TENDERNESS IN MOUTH AND THROAT WITH OR WITHOUT PRESENCE OF ULCERS (sore throat, sores in mouth, or a toothache) UNUSUAL RASH, SWELLING OR PAIN  UNUSUAL VAGINAL DISCHARGE OR ITCHING   Items with * indicate a potential emergency and should be followed up as soon as possible or go to the Emergency Department if any problems should occur.  Please show the CHEMOTHERAPY ALERT CARD or IMMUNOTHERAPY ALERT CARD at check-in to the  Emergency Department and triage nurse.  Should you have questions after your visit or need to cancel or reschedule your appointment, please contact [facility]  Dept: [phone]  and follow the prompts.  Office hours are 8:00 a.m. to 4:30 p.m. Monday - Friday. Please note that voicemails left after 4:00 p.m. may not be returned until the following business day.  We are closed weekends and major holidays. You have access to a nurse at all times for urgent questions. Please call the main number to the clinic Dept: [phone] and follow the prompts.   For any non-urgent questions, you may also contact your provider using MyChart. We now offer e-Visits for anyone 9 and older to request care online for non-urgent symptoms. For details visit [email or web address].   Also download the MyChart app! Go to the app store, search "MyChart", open the app, select [facility], and log in with your MyChart username and password.  Due to Covid, a mask is required upon entering the hospital/clinic. If you do not have a mask, one will be given to you upon arrival. For doctor visits, patients may have 1 support person aged 18 or older with them. For treatment visits, patients cannot have anyone with them due to current Covid guidelines and our immunocompromised population.   Romiplostim injection What is this medication? ROMIPLOSTIM ([place] mi PLOE stim) helps your body make more platelets. This medicine is used to treat low platelets caused by chronic idiopathic thrombocytopenic purpura (ITP) or a bone marrow syndrome caused by radiation sickness. This medicine may be used for other purposes; ask  your health care provider or pharmacist if you have questions. COMMON BRAND NAME(S): Nplate What should I tell my care team before I take this medication? They need to know if you have any of these conditions: blood clots myelodysplastic syndrome an unusual or allergic reaction to  romiplostim, mannitol, other medicines, foods, dyes, or preservatives pregnant or trying to get pregnant breast-feeding How should I use this medication? This medicine is injected under the skin. It is given by a health care provider in a hospital or clinic setting. A special MedGuide will be given to you before each treatment. Be sure to read this information carefully each time. Talk to your health care provider about the use of this medicine in children. While it may be prescribed for children as young as newborns for selected conditions, precautions do apply. Overdosage: If you think you have taken too much of this medicine contact a poison control center or emergency room at once. NOTE: This medicine is only for you. Do not share this medicine with others. What if I miss a dose? Keep appointments for follow-up doses. It is important not to miss your dose. Call your health care provider if you are unable to keep an appointment. What may interact with this medication? Interactions are not expected. This list may not describe all possible interactions. Give your health care provider a list of all the medicines, herbs, non-prescription drugs, or dietary supplements you use. Also tell them if you smoke, drink alcohol, or use illegal drugs. Some items may interact with your medicine. What should I watch for while using this medication? Visit your health care provider for regular checks on your progress. You may need blood work done while you are taking this medicine. Your condition will be monitored carefully while you are receiving this medicine. It is important not to miss any appointments. What side effects may I notice from receiving this medication? Side effects that you should report to your doctor or health care professional as soon as possible: allergic reactions (skin rash, itching or hives; swelling of the face, lips, or tongue) bleeding (bloody or black, tarry stools; red or dark brown  urine; spitting up blood or brown material that looks like coffee grounds; red spots on the skin; unusual bruising or bleeding from the eyes, gums, or nose) blood clot (chest pain; shortness of breath; pain, swelling, or warmth in the leg) stroke (changes in vision; confusion; trouble speaking or understanding; severe headaches; sudden numbness or weakness of the face, arm or leg; trouble walking; dizziness; loss of balance or coordination) Side effects that usually do not require medical attention (report to your doctor or health care professional if they continue or are bothersome): diarrhea dizziness headache joint pain muscle pain stomach pain trouble sleeping This list may not describe all possible side effects. Call your doctor for medical advice about side effects. You may report side effects to FDA at 1-800-FDA-1088. Where should I keep my medication? This medicine is given in a hospital or clinic. It will not be stored at home. NOTE: This sheet is a summary. It may not cover all possible information. If you have questions about this medicine, talk to your doctor, pharmacist, or health care provider.  2022 Elsevier/Gold Standard (2021-07-21 00:00:00)

## 2021-11-11 ENCOUNTER — Other Ambulatory Visit: Payer: Self-pay

## 2021-11-11 DIAGNOSIS — D696 Thrombocytopenia, unspecified: Secondary | ICD-10-CM

## 2021-11-16 ENCOUNTER — Other Ambulatory Visit: Payer: Self-pay | Admitting: Oncology

## 2021-11-17 ENCOUNTER — Ambulatory Visit: Payer: Self-pay | Admitting: Oncology

## 2021-11-17 ENCOUNTER — Ambulatory Visit: Payer: Self-pay

## 2021-11-17 ENCOUNTER — Telehealth: Payer: Self-pay | Admitting: *Deleted

## 2021-11-17 ENCOUNTER — Inpatient Hospital Stay: Payer: Medicare Other | Attending: Genetic Counselor

## 2021-11-17 ENCOUNTER — Inpatient Hospital Stay: Payer: Medicare Other

## 2021-11-17 ENCOUNTER — Other Ambulatory Visit: Payer: Self-pay

## 2021-11-17 ENCOUNTER — Inpatient Hospital Stay (HOSPITAL_BASED_OUTPATIENT_CLINIC_OR_DEPARTMENT_OTHER): Payer: Medicare Other | Admitting: Oncology

## 2021-11-17 VITALS — BP 80/66 | HR 66 | Temp 97.8°F | Resp 18

## 2021-11-17 VITALS — BP 99/58 | HR 74 | Temp 98.1°F | Resp 18 | Ht 69.0 in | Wt 123.0 lb

## 2021-11-17 DIAGNOSIS — I10 Essential (primary) hypertension: Secondary | ICD-10-CM | POA: Insufficient documentation

## 2021-11-17 DIAGNOSIS — Z5189 Encounter for other specified aftercare: Secondary | ICD-10-CM | POA: Diagnosis not present

## 2021-11-17 DIAGNOSIS — G620 Drug-induced polyneuropathy: Secondary | ICD-10-CM | POA: Insufficient documentation

## 2021-11-17 DIAGNOSIS — D696 Thrombocytopenia, unspecified: Secondary | ICD-10-CM | POA: Insufficient documentation

## 2021-11-17 DIAGNOSIS — R109 Unspecified abdominal pain: Secondary | ICD-10-CM | POA: Insufficient documentation

## 2021-11-17 DIAGNOSIS — Z5111 Encounter for antineoplastic chemotherapy: Secondary | ICD-10-CM | POA: Insufficient documentation

## 2021-11-17 DIAGNOSIS — M549 Dorsalgia, unspecified: Secondary | ICD-10-CM | POA: Diagnosis not present

## 2021-11-17 DIAGNOSIS — C250 Malignant neoplasm of head of pancreas: Secondary | ICD-10-CM | POA: Diagnosis present

## 2021-11-17 DIAGNOSIS — Z95828 Presence of other vascular implants and grafts: Secondary | ICD-10-CM

## 2021-11-17 DIAGNOSIS — E876 Hypokalemia: Secondary | ICD-10-CM | POA: Insufficient documentation

## 2021-11-17 DIAGNOSIS — D702 Other drug-induced agranulocytosis: Secondary | ICD-10-CM | POA: Insufficient documentation

## 2021-11-17 DIAGNOSIS — C787 Secondary malignant neoplasm of liver and intrahepatic bile duct: Secondary | ICD-10-CM | POA: Insufficient documentation

## 2021-11-17 DIAGNOSIS — E119 Type 2 diabetes mellitus without complications: Secondary | ICD-10-CM | POA: Diagnosis not present

## 2021-11-17 DIAGNOSIS — G629 Polyneuropathy, unspecified: Secondary | ICD-10-CM | POA: Insufficient documentation

## 2021-11-17 LAB — CMP (CANCER CENTER ONLY)
ALT: 31 U/L (ref 0–44)
AST: 22 U/L (ref 15–41)
Albumin: 3.6 g/dL (ref 3.5–5.0)
Alkaline Phosphatase: 277 U/L — ABNORMAL HIGH (ref 38–126)
Anion gap: 10 (ref 5–15)
BUN: 10 mg/dL (ref 8–23)
CO2: 16 mmol/L — ABNORMAL LOW (ref 22–32)
Calcium: 8.6 mg/dL — ABNORMAL LOW (ref 8.9–10.3)
Chloride: 111 mmol/L (ref 98–111)
Creatinine: 1.22 mg/dL (ref 0.61–1.24)
GFR, Estimated: >60 mL/min (ref >60)
Glucose, Bld: 128 mg/dL — ABNORMAL HIGH (ref 70–99)
Potassium: 3.6 mmol/L (ref 3.5–5.1)
Sodium: 137 mmol/L (ref 135–145)
Total Bilirubin: 0.7 mg/dL (ref 0.3–1.2)
Total Protein: 6.3 g/dL — ABNORMAL LOW (ref 6.5–8.1)

## 2021-11-17 LAB — CBC WITH DIFFERENTIAL (CANCER CENTER ONLY)
Abs Immature Granulocytes: 0.14 10*3/uL — ABNORMAL HIGH (ref 0.00–0.07)
Basophils Absolute: 0 10*3/uL (ref 0.0–0.1)
Basophils Relative: 1 %
Eosinophils Absolute: 0.1 10*3/uL (ref 0.0–0.5)
Eosinophils Relative: 2 %
HCT: 26.9 % — ABNORMAL LOW (ref 39.0–52.0)
Hemoglobin: 8.5 g/dL — ABNORMAL LOW (ref 13.0–17.0)
Immature Granulocytes: 3 %
Lymphocytes Relative: 13 %
Lymphs Abs: 0.7 10*3/uL (ref 0.7–4.0)
MCH: 30.4 pg (ref 26.0–34.0)
MCHC: 31.6 g/dL (ref 30.0–36.0)
MCV: 96.1 fL (ref 80.0–100.0)
Monocytes Absolute: 0.3 10*3/uL (ref 0.1–1.0)
Monocytes Relative: 6 %
Neutro Abs: 4 10*3/uL (ref 1.7–7.7)
Neutrophils Relative %: 75 %
Platelet Count: 114 10*3/uL — ABNORMAL LOW (ref 150–400)
RBC: 2.8 MIL/uL — ABNORMAL LOW (ref 4.22–5.81)
RDW: 18.8 % — ABNORMAL HIGH (ref 11.5–15.5)
WBC Count: 5.3 10*3/uL (ref 4.0–10.5)
nRBC: 0 % (ref 0.0–0.2)

## 2021-11-17 LAB — SAMPLE TO BLOOD BANK

## 2021-11-17 MED ORDER — SODIUM CHLORIDE 0.9 % IV SOLN: Freq: Once | INTRAVENOUS | Status: AC

## 2021-11-17 MED ORDER — SODIUM CHLORIDE 0.9 % IV SOLN
10.0000 mg | Freq: Once | INTRAVENOUS | Status: AC
  Administered 2021-11-17: 10 mg via INTRAVENOUS
  Filled 2021-11-17: qty 1

## 2021-11-17 MED ORDER — ROMIPLOSTIM 125 MCG ~~LOC~~ SOLR
2.0000 ug/kg | Freq: Once | SUBCUTANEOUS | Status: AC
  Administered 2021-11-17: 110 ug via SUBCUTANEOUS
  Filled 2021-11-17: qty 0.22

## 2021-11-17 MED ORDER — SODIUM CHLORIDE 0.9 % IV SOLN
1800.0000 mg/m2 | INTRAVENOUS | Status: DC
  Administered 2021-11-17: 3050 mg via INTRAVENOUS
  Filled 2021-11-17: qty 61

## 2021-11-17 MED ORDER — SODIUM CHLORIDE 0.9 % IV SOLN
300.0000 mg/m2 | Freq: Once | INTRAVENOUS | Status: AC
  Administered 2021-11-17: 508 mg via INTRAVENOUS
  Filled 2021-11-17: qty 25.4

## 2021-11-17 MED ORDER — SODIUM CHLORIDE 0.9 % IV SOLN
40.0000 mg/m2 | Freq: Once | INTRAVENOUS | Status: AC
  Administered 2021-11-17: 68.8 mg via INTRAVENOUS
  Filled 2021-11-17: qty 16

## 2021-11-17 MED ORDER — PALONOSETRON HCL INJECTION 0.25 MG/5ML
0.2500 mg | Freq: Once | INTRAVENOUS | Status: AC
  Administered 2021-11-17: 0.25 mg via INTRAVENOUS
  Filled 2021-11-17: qty 5

## 2021-11-17 NOTE — Telephone Encounter (Signed)
Aaron Johnston called back to confirm MRI appointment and prep.

## 2021-11-17 NOTE — Progress Notes (Signed)
Patient presents for treatment. RN assessment completed along with the following:  Labs/vitals reviewed - Yes, and within treatment parameters.   Weight within 10% of previous measurement - Yes Oncology Treatment Attestation completed for current therapy- Yes, on date 09/01/2021 Informed consent completed and reflects current therapy/intent - Yes, on date 09/08/2021             Provider progress note reviewed - Yes, today's provider note was reviewed. Treatment/Antibody/Supportive plan reviewed - Yes, and there are no adjustments needed for today's treatment. S&H and other orders reviewed - Yes, and there are no additional orders identified. Previous treatment date reviewed - Yes, and the appropriate amount of time has elapsed between treatments. Clinic Hand Off Received from - No.  Patient to proceed with treatment.

## 2021-11-17 NOTE — Telephone Encounter (Signed)
Left VM with MRI appointment at [facility] 11/27/21 at 4 pm with arrival at 3:30 in admitting department. NPO 4 hours prior. Requested she call to confirm.

## 2021-11-17 NOTE — Patient Instructions (Signed)
[facility]   Discharge Instructions: Thank you for choosing [facility] to provide your oncology and hematology care.   If you have a lab appointment with the [facility], please go directly to the [facility] and check in at the registration area.   Wear comfortable clothing and clothing appropriate for easy access to any Portacath or PICC line.   We strive to give you quality time with your provider. You may need to reschedule your appointment if you arrive late (15 or more minutes).  Arriving late affects you and other patients whose appointments are after yours.  Also, if you miss three or more appointments without notifying the office, you may be dismissed from the clinic at the providers discretion.      For prescription refill requests, have your pharmacy contact our office and allow 72 hours for refills to be completed.    Today you received the following chemotherapy and/or immunotherapy agents Irinotecan LIPOSOME (ONIVYDE), Leucovorin & Flourouracil (ADRUCIL).      To help prevent nausea and vomiting after your treatment, we encourage you to take your nausea medication as directed.  BELOW ARE SYMPTOMS THAT SHOULD BE REPORTED IMMEDIATELY: *FEVER GREATER THAN 100.4 F (38 C) OR HIGHER *CHILLS OR SWEATING *NAUSEA AND VOMITING THAT IS NOT CONTROLLED WITH YOUR NAUSEA MEDICATION *UNUSUAL SHORTNESS OF BREATH *UNUSUAL BRUISING OR BLEEDING *URINARY PROBLEMS (pain or burning when urinating, or frequent urination) *BOWEL PROBLEMS (unusual diarrhea, constipation, pain near the anus) TENDERNESS IN MOUTH AND THROAT WITH OR WITHOUT PRESENCE OF ULCERS (sore throat, sores in mouth, or a toothache) UNUSUAL RASH, SWELLING OR PAIN  UNUSUAL VAGINAL DISCHARGE OR ITCHING   Items with * indicate a potential emergency and should be followed up as soon as possible or go to the Emergency Department if any problems should occur.  Please show the  CHEMOTHERAPY ALERT CARD or IMMUNOTHERAPY ALERT CARD at check-in to the Emergency Department and triage nurse.  Should you have questions after your visit or need to cancel or reschedule your appointment, please contact [facility]  Dept: [phone]  and follow the prompts.  Office hours are 8:00 a.m. to 4:30 p.m. Monday - Friday. Please note that voicemails left after 4:00 p.m. may not be returned until the following business day.  We are closed weekends and major holidays. You have access to a nurse at all times for urgent questions. Please call the main number to the clinic Dept: [phone] and follow the prompts.   For any non-urgent questions, you may also contact your provider using MyChart. We now offer e-Visits for anyone 60 and older to request care online for non-urgent symptoms. For details visit [email or web address].   Also download the MyChart app! Go to the app store, search "MyChart", open the app, select [facility], and log in with your MyChart username and password.  Due to Covid, a mask is required upon entering the hospital/clinic. If you do not have a mask, one will be given to you upon arrival. For doctor visits, patients may have 1 support person aged 70 or older with them. For treatment visits, patients cannot have anyone with them due to current Covid guidelines and our immunocompromised population.   Irinotecan Liposomal injection What is this medication? IRINOTECAN LIPOSOME (eye ri noe TEE kan LIP oh som) is a chemotherapy drug. It is used to treat pancreatic cancer. This medicine may be used for other purposes; ask your health care provider or  pharmacist if you have questions. COMMON BRAND NAME(S): ONIVYDE What should I tell my care team before I take this medication? They need to know if you have any of these conditions: bleeding disorders dehydration history of blood diseases, like sickle cell anemia or leukemia history of low levels  of calcium, magnesium, or potassium in the blood infection (especially a virus infection such as chickenpox, cold sores, or herpes) liver disease low blood counts, like low white cell, platelet, or red cell counts lung or breathing disease, like asthma recent or ongoing radiation therapy an unusual or allergic reaction to irinotecan liposome, other medicines, foods, dyes, or preservatives pregnant or trying to get pregnant breast-feeding How should I use this medication? This drug is given as an infusion into a vein. It is administered in a hospital or clinic by a specially trained health care professional. Talk to your pediatrician regarding the use of this medicine in children. Special care may be needed. Overdosage: If you think you have taken too much of this medicine contact a poison control center or emergency room at once. NOTE: This medicine is only for you. Do not share this medicine with others. What if I miss a dose? It is important not to miss your dose. Call your doctor or health care professional if you are unable to keep an appointment. What may interact with this medication? This medicine may interact with the following medications: antiviral medicines for HIV or AIDS certain medications for fungal infections like ketoconazole, itraconazole, voriconazole certain medications for seizures like carbamazepine, fosphenytoin, phenytoin clarithromycin gemfibrozil mephobarbital nefazodone phenobarbital primidone rifabutin rifampin rifapentine [facility] John's Wort telaprevir This list may not describe all possible interactions. Give your health care provider a list of all the medicines, herbs, non-prescription drugs, or dietary supplements you use. Also tell them if you smoke, drink alcohol, or use illegal drugs. Some items may interact with your medicine. What should I watch for while using this medication? Check with your doctor or health care professional if you get an attack  of severe diarrhea, nausea and vomiting, or if you sweat a lot. The loss of too much body fluid can make it dangerous for you to take this medicine. This medicine may interfere with the ability to have a child. You should talk with your doctor or health care professional if you are concerned about your fertility. Do not become pregnant while taking this medicine or for 1 month after the last dose; males with male partners should use condoms during treatment and for 4 months after the last dose. Women should inform their doctor if they wish to become pregnant or think they might be pregnant. There is a potential for serious side effects to an unborn child. Talk to your health care professional or pharmacist for more information. Do not breast-feed an infant while taking this medicine or for 1 month after the last dose. Avoid taking products that contain aspirin, acetaminophen, ibuprofen, naproxen, or ketoprofen unless instructed by your doctor. These medicines may hide a fever. Be careful brushing and flossing your teeth or using a toothpick because you may get an infection or bleed more easily. If you have any dental work done, tell your dentist you are receiving this medicine. Call your doctor or health care professional for advice if you get a fever, chills or sore throat, or other symptoms of a cold or flu. Do not treat yourself. This drug decreases your body's ability to fight infections. Try to avoid being around people who are  sick. This medicine may increase your risk to bruise or bleed. Call your doctor or health care professional if you notice any unusual bleeding. This drug may make you feel generally unwell. This is not uncommon, as chemotherapy can affect healthy cells as well as cancer cells. Report any side effects. Continue your course of treatment even though you feel ill unless your doctor tells you to stop. What side effects may I notice from receiving this medication? Side effects that  you should report to your doctor or health care professional as soon as possible: allergic reactions like skin rash, itching or hives, swelling of the face, lips, or tongue breathing problems cough diarrhea low blood counts - this medicine may decrease the number of white blood cells, red blood cells and platelets. You may be at increased risk for infections and bleeding nausea, vomiting signs and symptoms of bleeding such as bloody or black, tarry stools; red or dark-brown urine; spitting up blood or brown material that looks like coffee grounds; red spots on the skin; unusual bruising or bleeding from the eye, gums, or nose signs of decreased red blood cells - unusually weak or tired, feeling faint or lightheaded, falls signs and symptoms of infection like fever or chills; cough; sore throat; pain or trouble passing urine signs and symptoms of liver injury like dark yellow or brown urine; general ill feeling or flu-like symptoms; light-colored stools; loss of appetite; nausea; right upper belly pain; unusually weak or tired; yellowing of the eyes or skin stomach pain Side effects that usually do not require medical attention (report to your doctor or health care professional if they continue or are bothersome): hair loss loss of appetite mouth sores upset stomach This list may not describe all possible side effects. Call your doctor for medical advice about side effects. You may report side effects to FDA at 1-800-FDA-1088. Where should I keep my medication? This drug is given in a hospital or clinic and will not be stored at home. NOTE: This sheet is a summary. It may not cover all possible information. If you have questions about this medicine, talk to your doctor, pharmacist, or health care provider.  2022 Elsevier/Gold Standard (2015-12-04 00:00:00)  Leucovorin injection What is this medication? LEUCOVORIN (loo koe VOR in) is used to prevent or treat the harmful effects of some  medicines. This medicine is used to treat anemia caused by a low amount of folic acid in the body. It is also used with 5-fluorouracil (5-FU) to treat colon cancer. This medicine may be used for other purposes; ask your health care provider or pharmacist if you have questions. What should I tell my care team before I take this medication? They need to know if you have any of these conditions: anemia from low levels of vitamin B-12 in the blood an unusual or allergic reaction to leucovorin, folic acid, other medicines, foods, dyes, or preservatives pregnant or trying to get pregnant breast-feeding How should I use this medication? This medicine is for injection into a muscle or into a vein. It is given by a health care professional in a hospital or clinic setting. Talk to your pediatrician regarding the use of this medicine in children. Special care may be needed. Overdosage: If you think you have taken too much of this medicine contact a poison control center or emergency room at once. NOTE: This medicine is only for you. Do not share this medicine with others. What if I miss a dose? This does not apply.  What may interact with this medication? capecitabine fluorouracil phenobarbital phenytoin primidone trimethoprim-sulfamethoxazole This list may not describe all possible interactions. Give your health care provider a list of all the medicines, herbs, non-prescription drugs, or dietary supplements you use. Also tell them if you smoke, drink alcohol, or use illegal drugs. Some items may interact with your medicine. What should I watch for while using this medication? Your condition will be monitored carefully while you are receiving this medicine. This medicine may increase the side effects of 5-fluorouracil, 5-FU. Tell your doctor or health care professional if you have diarrhea or mouth sores that do not get better or that get worse. What side effects may I notice from receiving this  medication? Side effects that you should report to your doctor or health care professional as soon as possible: allergic reactions like skin rash, itching or hives, swelling of the face, lips, or tongue breathing problems fever, infection mouth sores unusual bleeding or bruising unusually weak or tired Side effects that usually do not require medical attention (report to your doctor or health care professional if they continue or are bothersome): constipation or diarrhea loss of appetite nausea, vomiting This list may not describe all possible side effects. Call your doctor for medical advice about side effects. You may report side effects to FDA at 1-800-FDA-1088. Where should I keep my medication? This drug is given in a hospital or clinic and will not be stored at home. NOTE: This sheet is a summary. It may not cover all possible information. If you have questions about this medicine, talk to your doctor, pharmacist, or health care provider.  2022 Elsevier/Gold Standard (2008-05-09 00:00:00)  Fluorouracil, 5-FU injection What is this medication? FLUOROURACIL, 5-FU (flure oh YOOR a sil) is a chemotherapy drug. It slows the growth of cancer cells. This medicine is used to treat many types of cancer like breast cancer, colon or rectal cancer, pancreatic cancer, and stomach cancer. This medicine may be used for other purposes; ask your health care provider or pharmacist if you have questions. COMMON BRAND NAME(S): Adrucil What should I tell my care team before I take this medication? They need to know if you have any of these conditions: blood disorders dihydropyrimidine dehydrogenase (DPD) deficiency infection (especially a virus infection such as chickenpox, cold sores, or herpes) kidney disease liver disease malnourished, poor nutrition recent or ongoing radiation therapy an unusual or allergic reaction to fluorouracil, other chemotherapy, other medicines, foods, dyes, or  preservatives pregnant or trying to get pregnant breast-feeding How should I use this medication? This drug is given as an infusion or injection into a vein. It is administered in a hospital or clinic by a specially trained health care professional. Talk to your pediatrician regarding the use of this medicine in children. Special care may be needed. Overdosage: If you think you have taken too much of this medicine contact a poison control center or emergency room at once. NOTE: This medicine is only for you. Do not share this medicine with others. What if I miss a dose? It is important not to miss your dose. Call your doctor or health care professional if you are unable to keep an appointment. What may interact with this medication? Do not take this medicine with any of the following medications: live virus vaccines This medicine may also interact with the following medications: medicines that treat or prevent blood clots like warfarin, enoxaparin, and dalteparin This list may not describe all possible interactions. Give your health care provider  a list of all the medicines, herbs, non-prescription drugs, or dietary supplements you use. Also tell them if you smoke, drink alcohol, or use illegal drugs. Some items may interact with your medicine. What should I watch for while using this medication? Visit your doctor for checks on your progress. This drug may make you feel generally unwell. This is not uncommon, as chemotherapy can affect healthy cells as well as cancer cells. Report any side effects. Continue your course of treatment even though you feel ill unless your doctor tells you to stop. In some cases, you may be given additional medicines to help with side effects. Follow all directions for their use. Call your doctor or health care professional for advice if you get a fever, chills or sore throat, or other symptoms of a cold or flu. Do not treat yourself. This drug decreases your body's  ability to fight infections. Try to avoid being around people who are sick. This medicine may increase your risk to bruise or bleed. Call your doctor or health care professional if you notice any unusual bleeding. Be careful brushing and flossing your teeth or using a toothpick because you may get an infection or bleed more easily. If you have any dental work done, tell your dentist you are receiving this medicine. Avoid taking products that contain aspirin, acetaminophen, ibuprofen, naproxen, or ketoprofen unless instructed by your doctor. These medicines may hide a fever. Do not become pregnant while taking this medicine. Women should inform their doctor if they wish to become pregnant or think they might be pregnant. There is a potential for serious side effects to an unborn child. Talk to your health care professional or pharmacist for more information. Do not breast-feed an infant while taking this medicine. Men should inform their doctor if they wish to father a child. This medicine may lower sperm counts. Do not treat diarrhea with over the counter products. Contact your doctor if you have diarrhea that lasts more than 2 days or if it is severe and watery. This medicine can make you more sensitive to the sun. Keep out of the sun. If you cannot avoid being in the sun, wear protective clothing and use sunscreen. Do not use sun lamps or tanning beds/booths. What side effects may I notice from receiving this medication? Side effects that you should report to your doctor or health care professional as soon as possible: allergic reactions like skin rash, itching or hives, swelling of the face, lips, or tongue low blood counts - this medicine may decrease the number of white blood cells, red blood cells and platelets. You may be at increased risk for infections and bleeding. signs of infection - fever or chills, cough, sore throat, pain or difficulty passing urine signs of decreased platelets or  bleeding - bruising, pinpoint red spots on the skin, black, tarry stools, blood in the urine signs of decreased red blood cells - unusually weak or tired, fainting spells, lightheadedness breathing problems changes in vision chest pain mouth sores nausea and vomiting pain, swelling, redness at site where injected pain, tingling, numbness in the hands or feet redness, swelling, or sores on hands or feet stomach pain unusual bleeding Side effects that usually do not require medical attention (report to your doctor or health care professional if they continue or are bothersome): changes in finger or toe nails diarrhea dry or itchy skin hair loss headache loss of appetite sensitivity of eyes to the light stomach upset unusually teary eyes This list may  not describe all possible side effects. Call your doctor for medical advice about side effects. You may report side effects to FDA at 1-800-FDA-1088. Where should I keep my medication? This drug is given in a hospital or clinic and will not be stored at home. NOTE: This sheet is a summary. It may not cover all possible information. If you have questions about this medicine, talk to your doctor, pharmacist, or health care provider.  2022 Elsevier/Gold Standard (2021-07-21 00:00:00)  The chemotherapy medication bag should finish at 46 hours, 96 hours, or 7 days. For example, if your pump is scheduled for 46 hours and it was put on at 4:00 p.m., it should finish at 2:00 p.m. the day it is scheduled to come off regardless of your appointment time.     Estimated time to finish at 12:30 p.m. on Thursday 11/19/2021.   If the display on your pump reads "Low Volume" and it is beeping, take the batteries out of the pump and come to the cancer center for it to be taken off.   If the pump alarms go off prior to the pump reading "Low Volume" then call [phone] and someone can assist you.  If the plunger comes out and the chemotherapy  medication is leaking out, please use your home chemo spill kit to clean up the spill. Do NOT use paper towels or other household products.  If you have problems or questions regarding your pump, please call either [phone] (24 hours a day) or the cancer center Monday-Friday 8:00 a.m.- 4:30 p.m. at the clinic number and we will assist you. If you are unable to get assistance, then go to the nearest Emergency Department and ask the staff to contact the IV team for assistance.   Romiplostim injection What is this medication? ROMIPLOSTIM (roe mi PLOE stim) helps your body make more platelets. This medicine is used to treat low platelets caused by chronic idiopathic thrombocytopenic purpura (ITP) or a bone marrow syndrome caused by radiation sickness. This medicine may be used for other purposes; ask your health care provider or pharmacist if you have questions. COMMON BRAND NAME(S): Nplate What should I tell my care team before I take this medication? They need to know if you have any of these conditions: blood clots myelodysplastic syndrome an unusual or allergic reaction to romiplostim, mannitol, other medicines, foods, dyes, or preservatives pregnant or trying to get pregnant breast-feeding How should I use this medication? This medicine is injected under the skin. It is given by a health care provider in a hospital or clinic setting. A special MedGuide will be given to you before each treatment. Be sure to read this information carefully each time. Talk to your health care provider about the use of this medicine in children. While it may be prescribed for children as young as newborns for selected conditions, precautions do apply. Overdosage: If you think you have taken too much of this medicine contact a poison control center or emergency room at once. NOTE: This medicine is only for you. Do not share this medicine with others. What if I miss a dose? Keep appointments for follow-up  doses. It is important not to miss your dose. Call your health care provider if you are unable to keep an appointment. What may interact with this medication? Interactions are not expected. This list may not describe all possible interactions. Give your health care provider a list of all the medicines, herbs, non-prescription drugs, or dietary supplements you use. Also tell  them if you smoke, drink alcohol, or use illegal drugs. Some items may interact with your medicine. What should I watch for while using this medication? Visit your health care provider for regular checks on your progress. You may need blood work done while you are taking this medicine. Your condition will be monitored carefully while you are receiving this medicine. It is important not to miss any appointments. What side effects may I notice from receiving this medication? Side effects that you should report to your doctor or health care professional as soon as possible: allergic reactions (skin rash, itching or hives; swelling of the face, lips, or tongue) bleeding (bloody or black, tarry stools; red or dark brown urine; spitting up blood or brown material that looks like coffee grounds; red spots on the skin; unusual bruising or bleeding from the eyes, gums, or nose) blood clot (chest pain; shortness of breath; pain, swelling, or warmth in the leg) stroke (changes in vision; confusion; trouble speaking or understanding; severe headaches; sudden numbness or weakness of the face, arm or leg; trouble walking; dizziness; loss of balance or coordination) Side effects that usually do not require medical attention (report to your doctor or health care professional if they continue or are bothersome): diarrhea dizziness headache joint pain muscle pain stomach pain trouble sleeping This list may not describe all possible side effects. Call your doctor for medical advice about side effects. You may report side effects to FDA at  1-800-FDA-1088. Where should I keep my medication? This medicine is given in a hospital or clinic. It will not be stored at home. NOTE: This sheet is a summary. It may not cover all possible information. If you have questions about this medicine, talk to your doctor, pharmacist, or health care provider.  2022 Elsevier/Gold Standard (2021-07-21 00:00:00)

## 2021-11-17 NOTE — Progress Notes (Signed)
[place] OFFICE PROGRESS NOTE   Diagnosis: Pancreas cancer  INTERVAL HISTORY:   Mr. Morimoto completed on the cycle of 5-FU and liposomal irinotecan on 11/04/2021.  No nausea/vomiting or mouth sores.  He has intermittent diarrhea.  No pain.  He is taking Mucinex for "congestion ".  No dyspnea.  Objective:  Vital signs in last 24 hours:  Blood pressure (!) 99/58, pulse 74, temperature 98.1 F (36.7 C), temperature source Oral, resp. rate 18, height _0  (1.753 m), weight 123 lb (55.8 kg), SpO2 100 %.    HEENT: No thrush or ulcers Resp: Lungs clear bilaterally with decreased breath sounds at the right lower posterior chest, no respiratory distress Cardio: Regular rate and rhythm GI: No hepatosplenomegaly, no apparent ascites, no mass Vascular: No leg edema  Skin: Hyperpigmentation of the hands  Portacath/PICC-without erythema  Lab Results:  Lab Results  Component Value Date   WBC 5.3 11/17/2021   HGB 8.5 (L) 11/17/2021   HCT 26.9 (L) 11/17/2021   MCV 96.1 11/17/2021   PLT 114 (L) 11/17/2021   NEUTROABS 4.0 11/17/2021    CMP  Lab Results  Component Value Date   NA 137 11/17/2021   K 3.6 11/17/2021   CL 111 11/17/2021   CO2 16 (L) 11/17/2021   GLUCOSE 128 (H) 11/17/2021   BUN 10 11/17/2021   CREATININE 1.22 11/17/2021   CALCIUM 8.6 (L) 11/17/2021   PROT 6.3 (L) 11/17/2021   ALBUMIN 3.6 11/17/2021   AST 22 11/17/2021   ALT 31 11/17/2021   ALKPHOS 277 (H) 11/17/2021   BILITOT 0.7 11/17/2021   GFRNONAA >60 11/17/2021   GFRAA NOT CALCULATED 02/23/2021    Lab Results  Component Value Date   KWI097 5,760 (H) 11/04/2021     Medications: I have reviewed the patient's current medications.   Assessment/Plan: Pancreas cancer CT urology center 03/04/2020-haziness of the fat adjacent to the celiac axis and SMA with focal narrowing of the SMV, splenic vein, and splenoportal confluence CT 07/07/2020-infiltrative retroperitoneal mass with encasement  of the celiac axis and SMA, high-grade narrowing of the proximal portal vein with cavernous transformation, nonocclusive thrombus within the main portal vein, mild biliary ductal dilatation, diffuse hypodense/hypoenhancing areas within the liver (edema versus infiltrating neoplasm), 2 x 1.7 cm area of focal prominence in the pancreas EUS 07/10/2020-no pancreas mass identified, 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, 1.5 cm aortocaval node biopsy-scant lymphoid material, no malignancy Diagnostic laparoscopy 07/17/2020-segment 3 and 4 liver nodules, adenocarcinoma, positive for pankeratin, CK7 and CK20, partially positive for TTF-1, negative for CDX2 Foundation 1-microsatellite stable, tumor mutation burden 4, K-ras G12D, subclonal RB1 alteration Elevated CA 19-9 Cycle 1 FOLFOX 07/28/2020 Cycle 2 FOLFOX 08/18/2020, oxaliplatin dose reduced due to thrombocytopenia Cycle 3 FOLFOX 09/08/2020 Cycle 4 FOLFOX 09/29/2020  Cycle 5 FOLFOX 10/20/2020 CTs 11/06/2020-grossly stable infiltrative mass within the pancreatic head and porta hepatis.  New small low-density liver lesions.  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 Cycle 6 FOLFOX 11/10/2021  Cycle 7 FOLFOX 12/01/2020 MRI liver 12/04/2020-no significant change in posttreatment appearance of the pancreatic head.  Numerous intrinsically low signal hypoenhancing lesions of the liver parenchyma predominantly observed in the right lobe of the liver and liver dome, measuring no greater than 8 mm and as seen on recent prior CT of the abdomen/pelvis. Cycle 8 FOLFOX 12/22/2020 Cycle 9 FOLFOX 01/12/2021 Cycle 10 FOLFOX 02/02/2021  MRI liver 02/19/2021-no change in pancreas head mass, multiple hypoenhancing liver lesions are new and increased in size, effacement of portal vein by pancreas head mass with cavernous transformation, no change in mild  splenomegaly Cycle 1 gemcitabine/Abraxane 03/12/2021 Cycle 2 gemcitabine/Abraxane 03/27/2021- dose reductions secondary to neutropenia Cycle 3 gemcitabine/Abraxane 04/17/2021-dose reductions due to thrombocytopenia, white cell growth factor support added Cycle 4 gemcitabine/Abraxane 05/01/2021, Ziextenzo Cycle 5 gemcitabine/Abraxane 05/15/2021, Ziextenzo MRI abdomen 05/26/2021-mild enlargement of dominant hepatic metastases, appearance of infiltrative pancreas mass extending to the celiac trunk and SMA, splenic vein occlusion, cavernous transformation of the portal vein with upper abdomen collaterals, splenomegaly, mild upper abdominal ascites and mesenteric edema, minimal subpleural nodularity left lower lobe Cycle 6 gemcitabine/Abraxane 05/28/2021 Cycle 7 gemcitabine/Abraxane 06/12/2021 Cycle 8 gemcitabine/Abraxane 06/26/2021 Cycle 9 gemcitabine/Abraxane 07/10/2021 Cycle 10 gemcitabine/Abraxane 07/23/2021 Cycle 11 gemcitabine/Abraxane 08/06/2021 MRI abdomen 08/20/2021-multifocal nodularity in the lower chest with a new discrete right lower lobe nodule, enlarging subsegment 7/8 liver lesion, other small lesions appear stable, gastric varices, review of images with radiology-enlargement of at least 2 liver lesions, suspicious nodule n the right lower lung Cycle 1  5-FU/liposomal irinotecan 09/08/2021 Cycle 2  5-FU/liposomal irinotecan 09/22/2021 Cycle 3  5-FU/liposomal irinotecan 10/20/2021 Cycle 4 5-FU/liposomal irinotecan 11/04/2021 Cycle five 5-FU/liposomal irinotecan 11/17/2021   Chronic thrombocytopenia- weekly Nplate beginning 7/44/5146 Diabetes Hypertension Abdomen/back pain 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   Admission with recurrent GI bleeding and severe anemia 09/15/2021 Upper endoscopy 09/17/2021-esophageal varices with stigmata of recent bleeding, banded.  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  Influenza A 10/13/2021     Disposition: Aaron Johnston appears stable.  He completed another cycle of 5-FU/liposomal irinotecan today.  He will undergo a restaging MRI of the liver prior to an office visit in 2 weeks.  We will follow-up on the CA 19-9 from today.  He continues weekly Nplate.  Betsy Coder, MD  11/17/2021  9:18 AM

## 2021-11-18 LAB — CANCER ANTIGEN 19-9: CA 19-9: 4078 U/mL — ABNORMAL HIGH (ref 0–35)

## 2021-11-19 ENCOUNTER — Other Ambulatory Visit: Payer: Self-pay

## 2021-11-19 ENCOUNTER — Inpatient Hospital Stay: Payer: Medicare Other

## 2021-11-19 VITALS — BP 114/66 | HR 67 | Temp 97.8°F | Resp 18

## 2021-11-19 DIAGNOSIS — C250 Malignant neoplasm of head of pancreas: Secondary | ICD-10-CM

## 2021-11-19 DIAGNOSIS — Z5111 Encounter for antineoplastic chemotherapy: Secondary | ICD-10-CM | POA: Diagnosis not present

## 2021-11-19 MED ORDER — SODIUM CHLORIDE 0.9% FLUSH
10.0000 mL | INTRAVENOUS | Status: DC | PRN
  Administered 2021-11-19: 10 mL

## 2021-11-19 MED ORDER — PEGFILGRASTIM-BMEZ 6 MG/0.6ML ~~LOC~~ SOSY
6.0000 mg | PREFILLED_SYRINGE | Freq: Once | SUBCUTANEOUS | Status: AC
  Administered 2021-11-19: 6 mg via SUBCUTANEOUS

## 2021-11-19 MED ORDER — HEPARIN SOD (PORK) LOCK FLUSH 100 UNIT/ML IV SOLN
500.0000 [IU] | Freq: Once | INTRAVENOUS | Status: AC | PRN
  Administered 2021-11-19: 500 [IU]

## 2021-11-19 NOTE — Patient Instructions (Signed)

## 2021-11-24 ENCOUNTER — Inpatient Hospital Stay: Payer: Medicare Other

## 2021-11-24 ENCOUNTER — Other Ambulatory Visit: Payer: Self-pay

## 2021-11-24 VITALS — BP 103/54 | HR 73 | Temp 97.9°F | Resp 18 | Ht 69.0 in | Wt 121.6 lb

## 2021-11-24 DIAGNOSIS — D696 Thrombocytopenia, unspecified: Secondary | ICD-10-CM

## 2021-11-24 DIAGNOSIS — Z5111 Encounter for antineoplastic chemotherapy: Secondary | ICD-10-CM | POA: Diagnosis not present

## 2021-11-24 DIAGNOSIS — Z95828 Presence of other vascular implants and grafts: Secondary | ICD-10-CM

## 2021-11-24 DIAGNOSIS — C250 Malignant neoplasm of head of pancreas: Secondary | ICD-10-CM

## 2021-11-24 LAB — CBC WITH DIFFERENTIAL (CANCER CENTER ONLY)
Abs Immature Granulocytes: 0.2 10*3/uL — ABNORMAL HIGH (ref 0.00–0.07)
Basophils Absolute: 0 10*3/uL (ref 0.0–0.1)
Basophils Relative: 0 %
Eosinophils Absolute: 0.2 10*3/uL (ref 0.0–0.5)
Eosinophils Relative: 3 %
HCT: 25.2 % — ABNORMAL LOW (ref 39.0–52.0)
Hemoglobin: 8.1 g/dL — ABNORMAL LOW (ref 13.0–17.0)
Lymphocytes Relative: 6 %
Lymphs Abs: 0.4 10*3/uL — ABNORMAL LOW (ref 0.7–4.0)
MCH: 30.3 pg (ref 26.0–34.0)
MCHC: 32.1 g/dL (ref 30.0–36.0)
MCV: 94.4 fL (ref 80.0–100.0)
Metamyelocytes Relative: 3 %
Monocytes Absolute: 0.3 10*3/uL (ref 0.1–1.0)
Monocytes Relative: 5 %
Myelocytes: 1 %
Neutro Abs: 5.1 10*3/uL (ref 1.7–7.7)
Neutrophils Relative %: 82 %
Platelet Count: 141 10*3/uL — ABNORMAL LOW (ref 150–400)
RBC: 2.67 MIL/uL — ABNORMAL LOW (ref 4.22–5.81)
RDW: 19.3 % — ABNORMAL HIGH (ref 11.5–15.5)
WBC Count: 6.2 10*3/uL (ref 4.0–10.5)
nRBC: 0 % (ref 0.0–0.2)

## 2021-11-24 LAB — SAMPLE TO BLOOD BANK

## 2021-11-24 LAB — PREPARE RBC (CROSSMATCH)

## 2021-11-24 MED ORDER — HEPARIN SOD (PORK) LOCK FLUSH 100 UNIT/ML IV SOLN
500.0000 [IU] | Freq: Once | INTRAVENOUS | Status: AC | PRN
  Administered 2021-11-24: 500 [IU]

## 2021-11-24 MED ORDER — SODIUM CHLORIDE 0.9% FLUSH
10.0000 mL | INTRAVENOUS | Status: DC | PRN
  Administered 2021-11-24: 10 mL

## 2021-11-24 MED ORDER — ROMIPLOSTIM 125 MCG ~~LOC~~ SOLR
2.0000 ug/kg | Freq: Once | SUBCUTANEOUS | Status: AC
  Administered 2021-11-24: 110 ug via SUBCUTANEOUS
  Filled 2021-11-24: qty 0.22

## 2021-11-24 NOTE — Patient Instructions (Signed)
Romiplostim injection ?What is this medication? ?ROMIPLOSTIM (roe mi PLOE stim) helps your body make more platelets. This medicine is used to treat low platelets caused by chronic idiopathic thrombocytopenic purpura (ITP) or a bone marrow syndrome caused by radiation sickness. ?This medicine may be used for other purposes; ask your health care provider or pharmacist if you have questions. ?COMMON BRAND NAME(S): Nplate ?What should I tell my care team before I take this medication? ?They need to know if you have any of these conditions: ?blood clots ?myelodysplastic syndrome ?an unusual or allergic reaction to romiplostim, mannitol, other medicines, foods, dyes, or preservatives ?pregnant or trying to get pregnant ?breast-feeding ?How should I use this medication? ?This medicine is injected under the skin. It is given by a health care provider in a hospital or clinic setting. ?A special MedGuide will be given to you before each treatment. Be sure to read this information carefully each time. ?Talk to your health care provider about the use of this medicine in children. While it may be prescribed for children as young as newborns for selected conditions, precautions do apply. ?Overdosage: If you think you have taken too much of this medicine contact a poison control center or emergency room at once. ?NOTE: This medicine is only for you. Do not share this medicine with others. ?What if I miss a dose? ?Keep appointments for follow-up doses. It is important not to miss your dose. Call your health care provider if you are unable to keep an appointment. ?What may interact with this medication? ?Interactions are not expected. ?This list may not describe all possible interactions. Give your health care provider a list of all the medicines, herbs, non-prescription drugs, or dietary supplements you use. Also tell them if you smoke, drink alcohol, or use illegal drugs. Some items may interact with your medicine. ?What should I  watch for while using this medication? ?Visit your health care provider for regular checks on your progress. You may need blood work done while you are taking this medicine. Your condition will be monitored carefully while you are receiving this medicine. It is important not to miss any appointments. ?What side effects may I notice from receiving this medication? ?Side effects that you should report to your doctor or health care professional as soon as possible: ?allergic reactions (skin rash, itching or hives; swelling of the face, lips, or tongue) ?bleeding (bloody or black, tarry stools; red or dark brown urine; spitting up blood or brown material that looks like coffee grounds; red spots on the skin; unusual bruising or bleeding from the eyes, gums, or nose) ?blood clot (chest pain; shortness of breath; pain, swelling, or warmth in the leg) ?stroke (changes in vision; confusion; trouble speaking or understanding; severe headaches; sudden numbness or weakness of the face, arm or leg; trouble walking; dizziness; loss of balance or coordination) ?Side effects that usually do not require medical attention (report to your doctor or health care professional if they continue or are bothersome): ?diarrhea ?dizziness ?headache ?joint pain ?muscle pain ?stomach pain ?trouble sleeping ?This list may not describe all possible side effects. Call your doctor for medical advice about side effects. You may report side effects to FDA at 1-800-FDA-1088. ?Where should I keep my medication? ?This medicine is given in a hospital or clinic. It will not be stored at home. ?NOTE: This sheet is a summary. It may not cover all possible information. If you have questions about this medicine, talk to your doctor, pharmacist, or health care provider. ??   2022 Elsevier/Gold Standard (2021-07-21 00:00:00) ? ?

## 2021-11-25 ENCOUNTER — Inpatient Hospital Stay: Payer: Medicare Other

## 2021-11-25 ENCOUNTER — Telehealth: Payer: Self-pay

## 2021-11-25 DIAGNOSIS — Z5111 Encounter for antineoplastic chemotherapy: Secondary | ICD-10-CM | POA: Diagnosis not present

## 2021-11-25 DIAGNOSIS — C250 Malignant neoplasm of head of pancreas: Secondary | ICD-10-CM

## 2021-11-25 MED ORDER — HEPARIN SOD (PORK) LOCK FLUSH 100 UNIT/ML IV SOLN
500.0000 [IU] | Freq: Every day | INTRAVENOUS | Status: AC | PRN
  Administered 2021-11-25: 500 [IU]

## 2021-11-25 MED ORDER — SODIUM CHLORIDE 0.9% FLUSH
10.0000 mL | INTRAVENOUS | Status: AC | PRN
  Administered 2021-11-25: 10 mL

## 2021-11-25 MED ORDER — SODIUM CHLORIDE 0.9% IV SOLUTION
250.0000 mL | Freq: Once | INTRAVENOUS | Status: AC
  Administered 2021-11-25: 250 mL via INTRAVENOUS

## 2021-11-25 NOTE — Telephone Encounter (Signed)
Called and to check on the patient his bone tail is sore and patient stated he is in pain.

## 2021-11-25 NOTE — Patient Instructions (Signed)
[facility]   °Discharge Instructions: °Thank you for choosing [facility] to provide your oncology and hematology care.  ° °If you have a lab appointment with the Cancer Center, please go directly to the Cancer Center and check in at the registration area. °  °Wear comfortable clothing and clothing appropriate for easy access to any Portacath or PICC line.  ° °We strive to give you quality time with your provider. You may need to reschedule your appointment if you arrive late (15 or more minutes).  Arriving late affects you and other patients whose appointments are after yours.  Also, if you miss three or more appointments without notifying the office, you may be dismissed from the clinic at the provider’s discretion.    °  °For prescription refill requests, have your pharmacy contact our office and allow 72 hours for refills to be completed.   ° °Today you received the following: Blood transfusion °  °To help prevent nausea and vomiting after your treatment, we encourage you to take your nausea medication as directed. ° °BELOW ARE SYMPTOMS THAT SHOULD BE REPORTED IMMEDIATELY: °*FEVER GREATER THAN 100.4 F (38 °C) OR HIGHER °*CHILLS OR SWEATING °*NAUSEA AND VOMITING THAT IS NOT CONTROLLED WITH YOUR NAUSEA MEDICATION °*UNUSUAL SHORTNESS OF BREATH °*UNUSUAL BRUISING OR BLEEDING °*URINARY PROBLEMS (pain or burning when urinating, or frequent urination) °*BOWEL PROBLEMS (unusual diarrhea, constipation, pain near the anus) °TENDERNESS IN MOUTH AND THROAT WITH OR WITHOUT PRESENCE OF ULCERS (sore throat, sores in mouth, or a toothache) °UNUSUAL RASH, SWELLING OR PAIN  °UNUSUAL VAGINAL DISCHARGE OR ITCHING  ° °Items with * indicate a potential emergency and should be followed up as soon as possible or go to the Emergency Department if any problems should occur. ° °Please show the CHEMOTHERAPY ALERT CARD or IMMUNOTHERAPY ALERT CARD at check-in to the Emergency Department and triage  nurse. ° °Should you have questions after your visit or need to cancel or reschedule your appointment, please contact [facility]  Dept: [phone]  and follow the prompts.  Office hours are 8:00 a.m. to 4:30 p.m. Monday - Friday. Please note that voicemails left after 4:00 p.m. may not be returned until the following business day.  We are closed weekends and major holidays. You have access to a nurse at all times for urgent questions. Please call the main number to the clinic Dept: [phone] and follow the prompts. ° ° °For any non-urgent questions, you may also contact your provider using MyChart. We now offer e-Visits for anyone 18 and older to request care online for non-urgent symptoms. For details visit [email or web address]. °  °Also download the MyChart app! Go to the app store, search "MyChart", open the app, select [facility], and log in with your MyChart username and password. ° °Due to Covid, a mask is required upon entering the hospital/clinic. If you do not have a mask, one will be given to you upon arrival. For doctor visits, patients may have 1 support person aged 18 or older with them. For treatment visits, patients cannot have anyone with them due to current Covid guidelines and our immunocompromised population.  ° °Blood Transfusion, Adult, Care After °This sheet gives you information about how to care for yourself after your procedure. Your doctor may also give you more specific instructions. If you have problems or questions, contact your doctor. °What can I expect after the procedure? °After the procedure, it is common to have: °Bruising and   soreness at the IV site. °A headache. °Follow these instructions at home: °Insertion site care °  °Follow instructions from your doctor about how to take care of your insertion site. This is where an IV tube was put into your vein. Make sure you: °Wash your hands with soap and water before and after you change your bandage  (dressing). If you cannot use soap and water, use hand sanitizer. °Change your bandage as told by your doctor. °Check your insertion site every day for signs of infection. Check for: °Redness, swelling, or pain. °Bleeding from the site. °Warmth. °Pus or a bad smell. °General instructions °Take over-the-counter and prescription medicines only as told by your doctor. °Rest as told by your doctor. °Go back to your normal activities as told by your doctor. °Keep all follow-up visits as told by your doctor. This is important. °Contact a doctor if: °You have itching or red, swollen areas of skin (hives). °You feel worried or nervous (anxious). °You feel weak after doing your normal activities. °You have redness, swelling, warmth, or pain around the insertion site. °You have blood coming from the insertion site, and the blood does not stop with pressure. °You have pus or a bad smell coming from the insertion site. °Get help right away if: °You have signs of a serious reaction. This may be coming from an allergy or the body's defense system (immune system). Signs include: °Trouble breathing or shortness of breath. °Swelling of the face or feeling warm (flushed). °Fever or chills. °Head, chest, or back pain. °Dark pee (urine) or blood in the pee. °Widespread rash. °Fast heartbeat. °Feeling dizzy or light-headed. °You may receive your blood transfusion in an outpatient setting. If so, you will be told whom to contact to report any reactions. °These symptoms may be an emergency. Do not wait to see if the symptoms will go away. Get medical help right away. Call your local emergency services (911 in the [place] Do not drive yourself to the hospital. °Summary °Bruising and soreness at the IV site are common. °Check your insertion site every day for signs of infection. °Rest as told by your doctor. Go back to your normal activities as told by your doctor. °Get help right away if you have signs of a serious reaction. °This  information is not intended to replace advice given to you by your health care provider. Make sure you discuss any questions you have with your health care provider. °Document Revised: 02/26/2021 Document Reviewed: 04/26/2019 °Elsevier Patient Education © 2022 [employer] ° °

## 2021-11-26 LAB — BPAM RBC
Blood Product Expiration Date: 202301272359
ISSUE DATE / TIME: 202301110816
Unit Type and Rh: 6200

## 2021-11-26 LAB — TYPE AND SCREEN
ABO/RH(D): A POS
Antibody Screen: NEGATIVE
Unit division: 0

## 2021-11-27 ENCOUNTER — Ambulatory Visit (HOSPITAL_COMMUNITY)
Admission: RE | Admit: 2021-11-27 | Discharge: 2021-11-27 | Disposition: A | Discharge status: Home / Self Care | Payer: Medicare Other | Source: Ambulatory Visit | Attending: Oncology | Admitting: Oncology

## 2021-11-27 ENCOUNTER — Other Ambulatory Visit: Payer: Self-pay | Admitting: Oncology

## 2021-11-27 DIAGNOSIS — C250 Malignant neoplasm of head of pancreas: Secondary | ICD-10-CM | POA: Insufficient documentation

## 2021-11-27 MED ORDER — GADOBUTROL 1 MMOL/ML IV SOLN
6.0000 mL | Freq: Once | INTRAVENOUS | Status: AC | PRN
  Administered 2021-11-27: 6 mL via INTRAVENOUS

## 2021-11-28 ENCOUNTER — Other Ambulatory Visit: Payer: Self-pay | Admitting: Oncology

## 2021-11-28 DIAGNOSIS — C250 Malignant neoplasm of head of pancreas: Secondary | ICD-10-CM

## 2021-11-29 ENCOUNTER — Other Ambulatory Visit: Payer: Self-pay | Admitting: Oncology

## 2021-12-01 ENCOUNTER — Inpatient Hospital Stay: Payer: Medicare Other

## 2021-12-01 ENCOUNTER — Telehealth: Payer: Self-pay

## 2021-12-01 ENCOUNTER — Other Ambulatory Visit: Payer: Self-pay

## 2021-12-01 ENCOUNTER — Encounter: Payer: Self-pay | Admitting: Nurse Practitioner

## 2021-12-01 ENCOUNTER — Encounter: Payer: Self-pay | Admitting: *Deleted

## 2021-12-01 ENCOUNTER — Inpatient Hospital Stay (HOSPITAL_BASED_OUTPATIENT_CLINIC_OR_DEPARTMENT_OTHER): Payer: Medicare Other | Admitting: Nurse Practitioner

## 2021-12-01 VITALS — BP 101/64 | HR 63 | Temp 97.8°F | Resp 18 | Ht 69.0 in | Wt 122.6 lb

## 2021-12-01 VITALS — BP 106/59 | HR 73 | Resp 18

## 2021-12-01 DIAGNOSIS — C250 Malignant neoplasm of head of pancreas: Secondary | ICD-10-CM | POA: Diagnosis not present

## 2021-12-01 DIAGNOSIS — D696 Thrombocytopenia, unspecified: Secondary | ICD-10-CM

## 2021-12-01 DIAGNOSIS — Z95828 Presence of other vascular implants and grafts: Secondary | ICD-10-CM

## 2021-12-01 DIAGNOSIS — Z5111 Encounter for antineoplastic chemotherapy: Secondary | ICD-10-CM | POA: Diagnosis not present

## 2021-12-01 LAB — CMP (CANCER CENTER ONLY)
ALT: 38 U/L (ref 0–44)
AST: 26 U/L (ref 15–41)
Albumin: 3.9 g/dL (ref 3.5–5.0)
Alkaline Phosphatase: 280 U/L — ABNORMAL HIGH (ref 38–126)
Anion gap: 9 (ref 5–15)
BUN: 9 mg/dL (ref 8–23)
CO2: 16 mmol/L — ABNORMAL LOW (ref 22–32)
Calcium: 8.7 mg/dL — ABNORMAL LOW (ref 8.9–10.3)
Chloride: 115 mmol/L — ABNORMAL HIGH (ref 98–111)
Creatinine: 1.24 mg/dL (ref 0.61–1.24)
GFR, Estimated: >60 mL/min (ref >60)
Glucose, Bld: 130 mg/dL — ABNORMAL HIGH (ref 70–99)
Potassium: 3.3 mmol/L — ABNORMAL LOW (ref 3.5–5.1)
Sodium: 140 mmol/L (ref 135–145)
Total Bilirubin: 0.8 mg/dL (ref 0.3–1.2)
Total Protein: 6.5 g/dL (ref 6.5–8.1)

## 2021-12-01 LAB — CBC WITH DIFFERENTIAL (CANCER CENTER ONLY)
Abs Immature Granulocytes: 0.14 10*3/uL — ABNORMAL HIGH (ref 0.00–0.07)
Basophils Absolute: 0.1 10*3/uL (ref 0.0–0.1)
Basophils Relative: 1 %
Eosinophils Absolute: 0.1 10*3/uL (ref 0.0–0.5)
Eosinophils Relative: 1 %
HCT: 29.1 % — ABNORMAL LOW (ref 39.0–52.0)
Hemoglobin: 9.5 g/dL — ABNORMAL LOW (ref 13.0–17.0)
Immature Granulocytes: 2 %
Lymphocytes Relative: 8 %
Lymphs Abs: 0.7 10*3/uL (ref 0.7–4.0)
MCH: 30.8 pg (ref 26.0–34.0)
MCHC: 32.6 g/dL (ref 30.0–36.0)
MCV: 94.5 fL (ref 80.0–100.0)
Monocytes Absolute: 0.5 10*3/uL (ref 0.1–1.0)
Monocytes Relative: 5 %
Neutro Abs: 7.8 10*3/uL — ABNORMAL HIGH (ref 1.7–7.7)
Neutrophils Relative %: 83 %
Platelet Count: 149 10*3/uL — ABNORMAL LOW (ref 150–400)
RBC: 3.08 MIL/uL — ABNORMAL LOW (ref 4.22–5.81)
RDW: 19.4 % — ABNORMAL HIGH (ref 11.5–15.5)
WBC Count: 9.4 10*3/uL (ref 4.0–10.5)
nRBC: 0 % (ref 0.0–0.2)

## 2021-12-01 LAB — SAMPLE TO BLOOD BANK

## 2021-12-01 MED ORDER — SODIUM CHLORIDE 0.9% FLUSH: 10.0000 mL | INTRAVENOUS | Status: DC | PRN

## 2021-12-01 MED ORDER — SODIUM CHLORIDE 0.9 % IV SOLN
10.0000 mg | Freq: Once | INTRAVENOUS | Status: AC
  Administered 2021-12-01: 10 mg via INTRAVENOUS
  Filled 2021-12-01: qty 1

## 2021-12-01 MED ORDER — SODIUM CHLORIDE 0.9 % IV SOLN
1800.0000 mg/m2 | INTRAVENOUS | Status: DC
  Administered 2021-12-01: 3050 mg via INTRAVENOUS
  Filled 2021-12-01: qty 61

## 2021-12-01 MED ORDER — ROMIPLOSTIM 125 MCG ~~LOC~~ SOLR
2.0000 ug/kg | Freq: Once | SUBCUTANEOUS | Status: AC
  Administered 2021-12-01: 110 ug via SUBCUTANEOUS
  Filled 2021-12-01: qty 0.22

## 2021-12-01 MED ORDER — PALONOSETRON HCL INJECTION 0.25 MG/5ML
0.2500 mg | Freq: Once | INTRAVENOUS | Status: AC
  Administered 2021-12-01: 0.25 mg via INTRAVENOUS
  Filled 2021-12-01: qty 5

## 2021-12-01 MED ORDER — HEPARIN SOD (PORK) LOCK FLUSH 100 UNIT/ML IV SOLN: 500.0000 [IU] | Freq: Once | INTRAVENOUS | Status: DC | PRN

## 2021-12-01 MED ORDER — SODIUM CHLORIDE 0.9 % IV SOLN: Freq: Once | INTRAVENOUS | Status: AC

## 2021-12-01 MED ORDER — SODIUM CHLORIDE 0.9 % IV SOLN
300.0000 mg/m2 | Freq: Once | INTRAVENOUS | Status: AC
  Administered 2021-12-01: 508 mg via INTRAVENOUS
  Filled 2021-12-01: qty 25

## 2021-12-01 MED ORDER — SODIUM CHLORIDE 0.9 % IV SOLN
40.0000 mg/m2 | Freq: Once | INTRAVENOUS | Status: AC
  Administered 2021-12-01: 68.8 mg via INTRAVENOUS
  Filled 2021-12-01: qty 16

## 2021-12-01 NOTE — Patient Instructions (Signed)
[place]   Discharge Instructions: Thank you for choosing [facility] to provide your oncology and hematology care.   If you have a lab appointment with the [facility], please go directly to the [facility] and check in at the registration area.   Wear comfortable clothing and clothing appropriate for easy access to any Portacath or PICC line.   We strive to give you quality time with your provider. You may need to reschedule your appointment if you arrive late (15 or more minutes).  Arriving late affects you and other patients whose appointments are after yours.  Also, if you miss three or more appointments without notifying the office, you may be dismissed from the clinic at the providers discretion.      For prescription refill requests, have your pharmacy contact our office and allow 72 hours for refills to be completed.    Today you received the following chemotherapy and/or immunotherapy agents Irinotecan LIPOSOME (ONIVYDE), Leucovorin & Flourouracil (ADRUCIL).      To help prevent nausea and vomiting after your treatment, we encourage you to take your nausea medication as directed.  BELOW ARE SYMPTOMS THAT SHOULD BE REPORTED IMMEDIATELY: *FEVER GREATER THAN 100.4 F (38 C) OR HIGHER *CHILLS OR SWEATING *NAUSEA AND VOMITING THAT IS NOT CONTROLLED WITH YOUR NAUSEA MEDICATION *UNUSUAL SHORTNESS OF BREATH *UNUSUAL BRUISING OR BLEEDING *URINARY PROBLEMS (pain or burning when urinating, or frequent urination) *BOWEL PROBLEMS (unusual diarrhea, constipation, pain near the anus) TENDERNESS IN MOUTH AND THROAT WITH OR WITHOUT PRESENCE OF ULCERS (sore throat, sores in mouth, or a toothache) UNUSUAL RASH, SWELLING OR PAIN  UNUSUAL VAGINAL DISCHARGE OR ITCHING   Items with * indicate a potential emergency and should be followed up as soon as possible or go to the Emergency Department if any problems should occur.  Please show the  CHEMOTHERAPY ALERT CARD or IMMUNOTHERAPY ALERT CARD at check-in to the Emergency Department and triage nurse.  Should you have questions after your visit or need to cancel or reschedule your appointment, please contact [facility]  Dept: [phone]  and follow the prompts.  Office hours are 8:00 a.m. to 4:30 p.m. Monday - Friday. Please note that voicemails left after 4:00 p.m. may not be returned until the following business day.  We are closed weekends and major holidays. You have access to a nurse at all times for urgent questions. Please call the main number to the clinic Dept: [phone] and follow the prompts.   For any non-urgent questions, you may also contact your provider using MyChart. We now offer e-Visits for anyone 60 and older to request care online for non-urgent symptoms. For details visit [email or web address].   Also download the MyChart app! Go to the app store, search "MyChart", open the app, select [facility], and log in with your MyChart username and password.  Due to Covid, a mask is required upon entering the hospital/clinic. If you do not have a mask, one will be given to you upon arrival. For doctor visits, patients may have 1 support person aged 100 or older with them. For treatment visits, patients cannot have anyone with them due to current Covid guidelines and our immunocompromised population.   Irinotecan Liposomal injection What is this medication? IRINOTECAN LIPOSOME (eye ri noe TEE kan LIP oh som) is a chemotherapy drug. It is used to treat pancreatic cancer. This medicine may be used for other purposes; ask your health care provider or  pharmacist if you have questions. COMMON BRAND NAME(S): ONIVYDE What should I tell my care team before I take this medication? They need to know if you have any of these conditions: bleeding disorders dehydration history of blood diseases, like sickle cell anemia or leukemia history of low levels  of calcium, magnesium, or potassium in the blood infection (especially a virus infection such as chickenpox, cold sores, or herpes) liver disease low blood counts, like low white cell, platelet, or red cell counts lung or breathing disease, like asthma recent or ongoing radiation therapy an unusual or allergic reaction to irinotecan liposome, other medicines, foods, dyes, or preservatives pregnant or trying to get pregnant breast-feeding How should I use this medication? This drug is given as an infusion into a vein. It is administered in a hospital or clinic by a specially trained health care professional. Talk to your pediatrician regarding the use of this medicine in children. Special care may be needed. Overdosage: If you think you have taken too much of this medicine contact a poison control center or emergency room at once. NOTE: This medicine is only for you. Do not share this medicine with others. What if I miss a dose? It is important not to miss your dose. Call your doctor or health care professional if you are unable to keep an appointment. What may interact with this medication? This medicine may interact with the following medications: antiviral medicines for HIV or AIDS certain medications for fungal infections like ketoconazole, itraconazole, voriconazole certain medications for seizures like carbamazepine, fosphenytoin, phenytoin clarithromycin gemfibrozil mephobarbital nefazodone phenobarbital primidone rifabutin rifampin rifapentine [facility]. John's Wort telaprevir This list may not describe all possible interactions. Give your health care provider a list of all the medicines, herbs, non-prescription drugs, or dietary supplements you use. Also tell them if you smoke, drink alcohol, or use illegal drugs. Some items may interact with your medicine. What should I watch for while using this medication? Check with your doctor or health care professional if you get an attack  of severe diarrhea, nausea and vomiting, or if you sweat a lot. The loss of too much body fluid can make it dangerous for you to take this medicine. This medicine may interfere with the ability to have a child. You should talk with your doctor or health care professional if you are concerned about your fertility. Do not become pregnant while taking this medicine or for 1 month after the last dose; males with male partners should use condoms during treatment and for 4 months after the last dose. Women should inform their doctor if they wish to become pregnant or think they might be pregnant. There is a potential for serious side effects to an unborn child. Talk to your health care professional or pharmacist for more information. Do not breast-feed an infant while taking this medicine or for 1 month after the last dose. Avoid taking products that contain aspirin, acetaminophen, ibuprofen, naproxen, or ketoprofen unless instructed by your doctor. These medicines may hide a fever. Be careful brushing and flossing your teeth or using a toothpick because you may get an infection or bleed more easily. If you have any dental work done, tell your dentist you are receiving this medicine. Call your doctor or health care professional for advice if you get a fever, chills or sore throat, or other symptoms of a cold or flu. Do not treat yourself. This drug decreases your body's ability to fight infections. Try to avoid being around people who are  sick. This medicine may increase your risk to bruise or bleed. Call your doctor or health care professional if you notice any unusual bleeding. This drug may make you feel generally unwell. This is not uncommon, as chemotherapy can affect healthy cells as well as cancer cells. Report any side effects. Continue your course of treatment even though you feel ill unless your doctor tells you to stop. What side effects may I notice from receiving this medication? Side effects that  you should report to your doctor or health care professional as soon as possible: allergic reactions like skin rash, itching or hives, swelling of the face, lips, or tongue breathing problems cough diarrhea low blood counts - this medicine may decrease the number of white blood cells, red blood cells and platelets. You may be at increased risk for infections and bleeding nausea, vomiting signs and symptoms of bleeding such as bloody or black, tarry stools; red or dark-brown urine; spitting up blood or brown material that looks like coffee grounds; red spots on the skin; unusual bruising or bleeding from the eye, gums, or nose signs of decreased red blood cells - unusually weak or tired, feeling faint or lightheaded, falls signs and symptoms of infection like fever or chills; cough; sore throat; pain or trouble passing urine signs and symptoms of liver injury like dark yellow or brown urine; general ill feeling or flu-like symptoms; light-colored stools; loss of appetite; nausea; right upper belly pain; unusually weak or tired; yellowing of the eyes or skin stomach pain Side effects that usually do not require medical attention (report to your doctor or health care professional if they continue or are bothersome): hair loss loss of appetite mouth sores upset stomach This list may not describe all possible side effects. Call your doctor for medical advice about side effects. You may report side effects to FDA at 1-800-FDA-1088. Where should I keep my medication? This drug is given in a hospital or clinic and will not be stored at home. NOTE: This sheet is a summary. It may not cover all possible information. If you have questions about this medicine, talk to your doctor, pharmacist, or health care provider.  2022 Elsevier/Gold Standard (2015-12-04 00:00:00)  Leucovorin injection What is this medication? LEUCOVORIN (loo koe VOR in) is used to prevent or treat the harmful effects of some  medicines. This medicine is used to treat anemia caused by a low amount of folic acid in the body. It is also used with 5-fluorouracil (5-FU) to treat colon cancer. This medicine may be used for other purposes; ask your health care provider or pharmacist if you have questions. What should I tell my care team before I take this medication? They need to know if you have any of these conditions: anemia from low levels of vitamin B-12 in the blood an unusual or allergic reaction to leucovorin, folic acid, other medicines, foods, dyes, or preservatives pregnant or trying to get pregnant breast-feeding How should I use this medication? This medicine is for injection into a muscle or into a vein. It is given by a health care professional in a hospital or clinic setting. Talk to your pediatrician regarding the use of this medicine in children. Special care may be needed. Overdosage: If you think you have taken too much of this medicine contact a poison control center or emergency room at once. NOTE: This medicine is only for you. Do not share this medicine with others. What if I miss a dose? This does not apply.  What may interact with this medication? capecitabine fluorouracil phenobarbital phenytoin primidone trimethoprim-sulfamethoxazole This list may not describe all possible interactions. Give your health care provider a list of all the medicines, herbs, non-prescription drugs, or dietary supplements you use. Also tell them if you smoke, drink alcohol, or use illegal drugs. Some items may interact with your medicine. What should I watch for while using this medication? Your condition will be monitored carefully while you are receiving this medicine. This medicine may increase the side effects of 5-fluorouracil, 5-FU. Tell your doctor or health care professional if you have diarrhea or mouth sores that do not get better or that get worse. What side effects may I notice from receiving this  medication? Side effects that you should report to your doctor or health care professional as soon as possible: allergic reactions like skin rash, itching or hives, swelling of the face, lips, or tongue breathing problems fever, infection mouth sores unusual bleeding or bruising unusually weak or tired Side effects that usually do not require medical attention (report to your doctor or health care professional if they continue or are bothersome): constipation or diarrhea loss of appetite nausea, vomiting This list may not describe all possible side effects. Call your doctor for medical advice about side effects. You may report side effects to FDA at 1-800-FDA-1088. Where should I keep my medication? This drug is given in a hospital or clinic and will not be stored at home. NOTE: This sheet is a summary. It may not cover all possible information. If you have questions about this medicine, talk to your doctor, pharmacist, or health care provider.  2022 Elsevier/Gold Standard (2008-05-09 00:00:00)  Fluorouracil, 5-FU injection What is this medication? FLUOROURACIL, 5-FU (flure oh YOOR a sil) is a chemotherapy drug. It slows the growth of cancer cells. This medicine is used to treat many types of cancer like breast cancer, colon or rectal cancer, pancreatic cancer, and stomach cancer. This medicine may be used for other purposes; ask your health care provider or pharmacist if you have questions. COMMON BRAND NAME(S): Adrucil What should I tell my care team before I take this medication? They need to know if you have any of these conditions: blood disorders dihydropyrimidine dehydrogenase (DPD) deficiency infection (especially a virus infection such as chickenpox, cold sores, or herpes) kidney disease liver disease malnourished, poor nutrition recent or ongoing radiation therapy an unusual or allergic reaction to fluorouracil, other chemotherapy, other medicines, foods, dyes, or  preservatives pregnant or trying to get pregnant breast-feeding How should I use this medication? This drug is given as an infusion or injection into a vein. It is administered in a hospital or clinic by a specially trained health care professional. Talk to your pediatrician regarding the use of this medicine in children. Special care may be needed. Overdosage: If you think you have taken too much of this medicine contact a poison control center or emergency room at once. NOTE: This medicine is only for you. Do not share this medicine with others. What if I miss a dose? It is important not to miss your dose. Call your doctor or health care professional if you are unable to keep an appointment. What may interact with this medication? Do not take this medicine with any of the following medications: live virus vaccines This medicine may also interact with the following medications: medicines that treat or prevent blood clots like warfarin, enoxaparin, and dalteparin This list may not describe all possible interactions. Give your health care provider  a list of all the medicines, herbs, non-prescription drugs, or dietary supplements you use. Also tell them if you smoke, drink alcohol, or use illegal drugs. Some items may interact with your medicine. What should I watch for while using this medication? Visit your doctor for checks on your progress. This drug may make you feel generally unwell. This is not uncommon, as chemotherapy can affect healthy cells as well as cancer cells. Report any side effects. Continue your course of treatment even though you feel ill unless your doctor tells you to stop. In some cases, you may be given additional medicines to help with side effects. Follow all directions for their use. Call your doctor or health care professional for advice if you get a fever, chills or sore throat, or other symptoms of a cold or flu. Do not treat yourself. This drug decreases your body's  ability to fight infections. Try to avoid being around people who are sick. This medicine may increase your risk to bruise or bleed. Call your doctor or health care professional if you notice any unusual bleeding. Be careful brushing and flossing your teeth or using a toothpick because you may get an infection or bleed more easily. If you have any dental work done, tell your dentist you are receiving this medicine. Avoid taking products that contain aspirin, acetaminophen, ibuprofen, naproxen, or ketoprofen unless instructed by your doctor. These medicines may hide a fever. Do not become pregnant while taking this medicine. Women should inform their doctor if they wish to become pregnant or think they might be pregnant. There is a potential for serious side effects to an unborn child. Talk to your health care professional or pharmacist for more information. Do not breast-feed an infant while taking this medicine. Men should inform their doctor if they wish to father a child. This medicine may lower sperm counts. Do not treat diarrhea with over the counter products. Contact your doctor if you have diarrhea that lasts more than 2 days or if it is severe and watery. This medicine can make you more sensitive to the sun. Keep out of the sun. If you cannot avoid being in the sun, wear protective clothing and use sunscreen. Do not use sun lamps or tanning beds/booths. What side effects may I notice from receiving this medication? Side effects that you should report to your doctor or health care professional as soon as possible: allergic reactions like skin rash, itching or hives, swelling of the face, lips, or tongue low blood counts - this medicine may decrease the number of white blood cells, red blood cells and platelets. You may be at increased risk for infections and bleeding. signs of infection - fever or chills, cough, sore throat, pain or difficulty passing urine signs of decreased platelets or  bleeding - bruising, pinpoint red spots on the skin, black, tarry stools, blood in the urine signs of decreased red blood cells - unusually weak or tired, fainting spells, lightheadedness breathing problems changes in vision chest pain mouth sores nausea and vomiting pain, swelling, redness at site where injected pain, tingling, numbness in the hands or feet redness, swelling, or sores on hands or feet stomach pain unusual bleeding Side effects that usually do not require medical attention (report to your doctor or health care professional if they continue or are bothersome): changes in finger or toe nails diarrhea dry or itchy skin hair loss headache loss of appetite sensitivity of eyes to the light stomach upset unusually teary eyes This list may  not describe all possible side effects. Call your doctor for medical advice about side effects. You may report side effects to FDA at 1-800-FDA-1088. Where should I keep my medication? This drug is given in a hospital or clinic and will not be stored at home. NOTE: This sheet is a summary. It may not cover all possible information. If you have questions about this medicine, talk to your doctor, pharmacist, or health care provider.  2022 Elsevier/Gold Standard (2021-07-21 00:00:00)  The chemotherapy medication bag should finish at 46 hours, 96 hours, or 7 days. For example, if your pump is scheduled for 46 hours and it was put on at 4:00 p.m., it should finish at 2:00 p.m. the day it is scheduled to come off regardless of your appointment time.     Estimated time to finish at 1:30 p.m. on Thursday 12/01/2021.   If the display on your pump reads "Low Volume" and it is beeping, take the batteries out of the pump and come to the cancer center for it to be taken off.   If the pump alarms go off prior to the pump reading "Low Volume" then call [phone] and someone can assist you.  If the plunger comes out and the chemotherapy  medication is leaking out, please use your home chemo spill kit to clean up the spill. Do NOT use paper towels or other household products.  If you have problems or questions regarding your pump, please call either [phone] (24 hours a day) or the cancer center Monday-Friday 8:00 a.m.- 4:30 p.m. at the clinic number and we will assist you. If you are unable to get assistance, then go to the nearest Emergency Department and ask the staff to contact the IV team for assistance.   Romiplostim injection What is this medication? ROMIPLOSTIM (roe mi PLOE stim) helps your body make more platelets. This medicine is used to treat low platelets caused by chronic idiopathic thrombocytopenic purpura (ITP) or a bone marrow syndrome caused by radiation sickness. This medicine may be used for other purposes; ask your health care provider or pharmacist if you have questions. COMMON BRAND NAME(S): Nplate What should I tell my care team before I take this medication? They need to know if you have any of these conditions: blood clots myelodysplastic syndrome an unusual or allergic reaction to romiplostim, mannitol, other medicines, foods, dyes, or preservatives pregnant or trying to get pregnant breast-feeding How should I use this medication? This medicine is injected under the skin. It is given by a health care provider in a hospital or clinic setting. A special MedGuide will be given to you before each treatment. Be sure to read this information carefully each time. Talk to your health care provider about the use of this medicine in children. While it may be prescribed for children as young as newborns for selected conditions, precautions do apply. Overdosage: If you think you have taken too much of this medicine contact a poison control center or emergency room at once. NOTE: This medicine is only for you. Do not share this medicine with others. What if I miss a dose? Keep appointments for follow-up  doses. It is important not to miss your dose. Call your health care provider if you are unable to keep an appointment. What may interact with this medication? Interactions are not expected. This list may not describe all possible interactions. Give your health care provider a list of all the medicines, herbs, non-prescription drugs, or dietary supplements you use. Also tell  them if you smoke, drink alcohol, or use illegal drugs. Some items may interact with your medicine. What should I watch for while using this medication? Visit your health care provider for regular checks on your progress. You may need blood work done while you are taking this medicine. Your condition will be monitored carefully while you are receiving this medicine. It is important not to miss any appointments. What side effects may I notice from receiving this medication? Side effects that you should report to your doctor or health care professional as soon as possible: allergic reactions (skin rash, itching or hives; swelling of the face, lips, or tongue) bleeding (bloody or black, tarry stools; red or dark brown urine; spitting up blood or brown material that looks like coffee grounds; red spots on the skin; unusual bruising or bleeding from the eyes, gums, or nose) blood clot (chest pain; shortness of breath; pain, swelling, or warmth in the leg) stroke (changes in vision; confusion; trouble speaking or understanding; severe headaches; sudden numbness or weakness of the face, arm or leg; trouble walking; dizziness; loss of balance or coordination) Side effects that usually do not require medical attention (report to your doctor or health care professional if they continue or are bothersome): diarrhea dizziness headache joint pain muscle pain stomach pain trouble sleeping This list may not describe all possible side effects. Call your doctor for medical advice about side effects. You may report side effects to FDA at  1-800-FDA-1088. Where should I keep my medication? This medicine is given in a hospital or clinic. It will not be stored at home. NOTE: This sheet is a summary. It may not cover all possible information. If you have questions about this medicine, talk to your doctor, pharmacist, or health care provider.  2022 Elsevier/Gold Standard (2021-07-21 00:00:00)

## 2021-12-01 NOTE — Progress Notes (Signed)
[place] OFFICE PROGRESS NOTE   Diagnosis: Pancreas cancer  INTERVAL HISTORY:   Aaron Johnston returns for follow-up.  He completed cycle 5 5-FU/liposomal irinotecan 11/17/2021.  No nausea or vomiting.  No mouth sores.  No change in baseline bowel habits.  He complains of pain at the "tailbone" with sitting.  Objective:  Vital signs in last 24 hours:  Blood pressure 101/64, pulse 63, temperature 97.8 F (36.6 C), temperature source Oral, resp. rate 18, height '5\' 9"'  (1.753 m), weight 122 lb 9.6 oz (55.6 kg), SpO2 100 %.    HEENT: No thrush or ulcers. GI: Abdomen soft and nontender. Vascular: No leg edema. Skin: Palms without erythema. Port-A-Cath without erythema.   Lab Results:  Lab Results  Component Value Date   WBC 9.4 12/01/2021   HGB 9.5 (L) 12/01/2021   HCT 29.1 (L) 12/01/2021   MCV 94.5 12/01/2021   PLT 149 (L) 12/01/2021   NEUTROABS 7.8 (H) 12/01/2021    Imaging:  No results found.  Medications: I have reviewed the patient's current medications.  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, mild biliary ductal dilatation, diffuse hypodense/hypoenhancing areas within the liver (edema versus infiltrating neoplasm), 2 x 1.7 cm area of focal prominence in the pancreas EUS 07/10/2020-no pancreas mass identified, 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, 1.5 cm aortocaval node biopsy-scant lymphoid material, no malignancy Diagnostic laparoscopy 07/17/2020-segment 3 and 4 liver nodules, adenocarcinoma, positive for pankeratin, CK7 and CK20, partially positive for TTF-1, negative for  CDX2 Foundation 1-microsatellite stable, tumor mutation burden 4, K-ras G12D, subclonal RB1 alteration Elevated CA 19-9 Cycle 1 FOLFOX 07/28/2020 Cycle 2 FOLFOX 08/18/2020, oxaliplatin dose reduced due to thrombocytopenia Cycle 3 FOLFOX 09/08/2020 Cycle 4 FOLFOX 09/29/2020  Cycle 5 FOLFOX 10/20/2020 CTs 11/06/2020-grossly stable infiltrative mass within the pancreatic head and porta hepatis.  New small low-density liver lesions.  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 Cycle 6 FOLFOX 11/10/2021  Cycle 7 FOLFOX 12/01/2020 MRI liver 12/04/2020-no significant change in posttreatment appearance of the pancreatic head.  Numerous intrinsically low signal hypoenhancing lesions of the liver parenchyma predominantly observed in the right lobe of the liver and liver dome, measuring no greater than 8 mm and as seen on recent prior CT of the abdomen/pelvis. Cycle 8 FOLFOX 12/22/2020 Cycle 9 FOLFOX 01/12/2021 Cycle 10 FOLFOX 02/02/2021 MRI liver 02/19/2021-no change in pancreas head mass, multiple hypoenhancing liver lesions are new and increased in size, effacement of portal vein by pancreas head mass with cavernous transformation, no change in mild splenomegaly Cycle 1 gemcitabine/Abraxane 03/12/2021 Cycle 2 gemcitabine/Abraxane 03/27/2021- dose reductions secondary to neutropenia Cycle 3 gemcitabine/Abraxane 04/17/2021-dose reductions due to thrombocytopenia, white cell growth factor support added Cycle 4 gemcitabine/Abraxane 05/01/2021, Ziextenzo Cycle 5 gemcitabine/Abraxane 05/15/2021, Ziextenzo MRI abdomen 05/26/2021-mild enlargement of dominant hepatic metastases, appearance of infiltrative pancreas mass extending to the celiac trunk and SMA, splenic vein occlusion, cavernous transformation of the portal vein with upper abdomen collaterals, splenomegaly, mild upper abdominal ascites and  mesenteric edema, minimal subpleural nodularity left lower lobe Cycle 6 gemcitabine/Abraxane 05/28/2021 Cycle 7 gemcitabine/Abraxane 06/12/2021 Cycle 8 gemcitabine/Abraxane 06/26/2021  Cycle 9 gemcitabine/Abraxane 07/10/2021 Cycle 10 gemcitabine/Abraxane 07/23/2021 Cycle 11 gemcitabine/Abraxane 08/06/2021 MRI abdomen 08/20/2021-multifocal nodularity in the lower chest with a new discrete right lower lobe nodule, enlarging subsegment 7/8 liver lesion, other small lesions appear stable, gastric varices, review of images with radiology-enlargement of at least 2 liver lesions, suspicious nodule n the right lower lung Cycle 1  5-FU/liposomal irinotecan 09/08/2021 Cycle 2  5-FU/liposomal irinotecan 09/22/2021 Cycle 3  5-FU/liposomal irinotecan 10/20/2021 Cycle 4 5-FU/liposomal irinotecan 11/04/2021 Cycle 5 5-FU/liposomal irinotecan 11/17/2021 MRI abdomen 11/27/2021-generally unchanged appearance of the pancreas with a very ill-defined hypoenhancing mass centered within the pancreatic neck, again appears to encase the celiac axis and encasing occlude the superior mesenteric vein and splenic vein; interval enlargement of multiple liver lesions; numerous lung nodules in the bilateral lung bases; unchanged splenomegaly; multiple new heterogeneously and hypoenhancing subcapsular lesions of the spleen most consistent with infarctions; small volume ascites throughout the abdomen; distended, debris-filled stomach suggesting some degree of gastric outlet and/or duodenal obstruction. 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   Admission with recurrent GI bleeding and severe anemia 09/15/2021 Upper endoscopy 09/17/2021-esophageal varices with stigmata of recent bleeding, banded.  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  Influenza A 10/13/2021  Disposition: Aaron Johnston appears unchanged.  He has completed 5 cycles of 5-FU/liposomal irinotecan.  Restaging MRI of the liver shows increase in size of several liver lesions.  Dr. Benay Spice reviewed the images/report with Aaron Johnston and his wife at today's visit.  The CA 19-9 tumor marker has improved over the past month.  Dr. Benay Spice reviewed options including continuation of the current treatment since there has not been a dramatic change in the liver lesions, focus on comfort/quality of life with a hospice referral, refer for second opinion/clinical trial eligibility.  Aaron Johnston would like to continue the current treatment and be referred for a second opinion/clinical trial eligibility.  Plan to proceed with treatment today as scheduled.  We will refer him to Dr. Berneice Gandy at [facility].  CBC and chemistry panel reviewed.  Labs adequate to proceed with treatment.  He has mild hypokalemia.  He will try to take the potassium supplement on a daily basis.  Platelet count is stable.  Plan to continue weekly Nplate.  He will return for follow-up in 2 weeks.  We are available to see him sooner if needed.  Patient seen with Dr. Benay Spice.   [name] ANP/GNP-BC   12/01/2021  10:47 AM  This was a shared visit with [name].  We discussed the restaging MRI  findings and reviewed the images with Aaron Johnston and his wife.  There appears to be mild progression of disease in the liver.  His clinical status is stable.  The CA 19-9 has not changed significantly over the past few months.  There was a 3-week gap between the baseline MRI and starting the liposomal irinotecan.  Systemic options are limited.  We discussed hospice care. Mr.  Johnston would like to continue treatment.  We decided to continue the 5-FU/liposomal irinotecan.  He will be referred to Dr. Berneice Gandy for second opinion and consider eligibility for clinical trial.  I was present greater than 50% of today's visit.  I performed medical decision making.  [name], MD

## 2021-12-01 NOTE — Telephone Encounter (Signed)
Place a referral for wound care. Spoke with the patient in-person to inform him, [facility] will be reach out to him. Patient voiced understanding

## 2021-12-01 NOTE — Progress Notes (Signed)
Patient seen by Lisa Thomas NP today  Vitals are within treatment parameters.  Labs reviewed by Lisa Thomas NP and are within treatment parameters.  Per physician team, patient is ready for treatment and there are NO modifications to the treatment plan.     

## 2021-12-01 NOTE — Telephone Encounter (Signed)
Called the wound Center to close out patient wound referral.

## 2021-12-01 NOTE — Progress Notes (Signed)
Patient presents for treatment. RN assessment completed along with the following:  Labs/vitals reviewed - Yes, and Per [name], NP, ok to treat with K 3.3    Weight within 10% of previous measurement - Yes Informed consent completed and reflects current therapy/intent - Yes, on date 09/12/21             Provider progress note reviewed - Today's provider note is not yet available. I reviewed the most recent oncology provider progress note in chart dated 11/17/20. Treatment/Antibody/Supportive plan reviewed - Yes, and there are no adjustments needed for today's treatment. S&H and other orders reviewed - Yes, and there are no additional orders identified. Previous treatment date reviewed - Yes, and the appropriate amount of time has elapsed between treatments. Clinic Hand Off Received from - [name], NP and Merceda Elks, RN  Patient to proceed with treatment.

## 2021-12-01 NOTE — Patient Instructions (Signed)

## 2021-12-02 LAB — CANCER ANTIGEN 19-9: CA 19-9: 4587 U/mL — ABNORMAL HIGH (ref 0–35)

## 2021-12-03 ENCOUNTER — Encounter: Payer: Self-pay | Admitting: Nurse Practitioner

## 2021-12-03 ENCOUNTER — Inpatient Hospital Stay: Payer: Medicare Other

## 2021-12-03 ENCOUNTER — Encounter: Payer: Self-pay | Admitting: Oncology

## 2021-12-03 ENCOUNTER — Other Ambulatory Visit: Payer: Self-pay

## 2021-12-03 VITALS — BP 110/70 | HR 67 | Temp 97.8°F | Resp 19

## 2021-12-03 DIAGNOSIS — C250 Malignant neoplasm of head of pancreas: Secondary | ICD-10-CM

## 2021-12-03 DIAGNOSIS — Z5111 Encounter for antineoplastic chemotherapy: Secondary | ICD-10-CM | POA: Diagnosis not present

## 2021-12-03 MED ORDER — SODIUM CHLORIDE 0.9% FLUSH
10.0000 mL | INTRAVENOUS | Status: DC | PRN
  Administered 2021-12-03: 10 mL

## 2021-12-03 MED ORDER — HEPARIN SOD (PORK) LOCK FLUSH 100 UNIT/ML IV SOLN
500.0000 [IU] | Freq: Once | INTRAVENOUS | Status: AC | PRN
  Administered 2021-12-03: 500 [IU]

## 2021-12-03 MED ORDER — PEGFILGRASTIM-BMEZ 6 MG/0.6ML ~~LOC~~ SOSY
6.0000 mg | PREFILLED_SYRINGE | Freq: Once | SUBCUTANEOUS | Status: AC
  Administered 2021-12-03: 6 mg via SUBCUTANEOUS
  Filled 2021-12-03: qty 0.6

## 2021-12-03 NOTE — Patient Instructions (Signed)
[facility]  Discharge Instructions: Thank you for choosing [facility] to provide your oncology and hematology care.   If you have a lab appointment with the [facility], please go directly to the [facility] and check in at the registration area.   Wear comfortable clothing and clothing appropriate for easy access to any Portacath or PICC line.   We strive to give you quality time with your provider. You may need to reschedule your appointment if you arrive late (15 or more minutes).  Arriving late affects you and other patients whose appointments are after yours.  Also, if you miss three or more appointments without notifying the office, you may be dismissed from the clinic at the providers discretion.      For prescription refill requests, have your pharmacy contact our office and allow 72 hours for refills to be completed.    Today you received the following Ziextenzo   To help prevent nausea and vomiting after your treatment, we encourage you to take your nausea medication as directed.  BELOW ARE SYMPTOMS THAT SHOULD BE REPORTED IMMEDIATELY: *FEVER GREATER THAN 100.4 F (38 C) OR HIGHER *CHILLS OR SWEATING *NAUSEA AND VOMITING THAT IS NOT CONTROLLED WITH YOUR NAUSEA MEDICATION *UNUSUAL SHORTNESS OF BREATH *UNUSUAL BRUISING OR BLEEDING *URINARY PROBLEMS (pain or burning when urinating, or frequent urination) *BOWEL PROBLEMS (unusual diarrhea, constipation, pain near the anus) TENDERNESS IN MOUTH AND THROAT WITH OR WITHOUT PRESENCE OF ULCERS (sore throat, sores in mouth, or a toothache) UNUSUAL RASH, SWELLING OR PAIN  UNUSUAL VAGINAL DISCHARGE OR ITCHING   Items with * indicate a potential emergency and should be followed up as soon as possible or go to the Emergency Department if any problems should occur.  Please show the CHEMOTHERAPY ALERT CARD or IMMUNOTHERAPY ALERT CARD at check-in to the Emergency Department and triage  nurse.  Should you have questions after your visit or need to cancel or reschedule your appointment, please contact [facility]  Dept: [phone]  and follow the prompts.  Office hours are 8:00 a.m. to 4:30 p.m. Monday - Friday. Please note that voicemails left after 4:00 p.m. may not be returned until the following business day.  We are closed weekends and major holidays. You have access to a nurse at all times for urgent questions. Please call the main number to the clinic Dept: [phone] and follow the prompts.   For any non-urgent questions, you may also contact your provider using MyChart. We now offer e-Visits for anyone 57 and older to request care online for non-urgent symptoms. For details visit [email or web address].   Also download the MyChart app! Go to the app store, search "MyChart", open the app, select [facility], and log in with your MyChart username and password.  Due to Covid, a mask is required upon entering the hospital/clinic. If you do not have a mask, one will be given to you upon arrival. For doctor visits, patients may have 1 support person aged 51 or older with them. For treatment visits, patients cannot have anyone with them due to current Covid guidelines and our immunocompromised population.  Implanted [facility] Guide An implanted port is a device that is placed under the skin. It is usually placed in the chest. The device may vary based on the need. Implanted ports can be used to give IV medicine, to take blood, or to give fluids. You may have an implanted port if: You need IV  medicine that would be irritating to the small veins in your hands or arms. You need IV medicines, such as chemotherapy, for a long period of time. You need IV nutrition for a long period of time. You may have fewer limitations when using a port than you would if you used other types of long-term IVs. You will also likely be able to return to normal activities  after your incision heals. An implanted port has two main parts: Reservoir. The reservoir is the part where a needle is inserted to give medicines or draw blood. The reservoir is round. After the port is placed, it appears as a small, raised area under your skin. Catheter. The catheter is a small, thin tube that connects the reservoir to a vein. Medicine that is inserted into the reservoir goes into the catheter and then into the vein. How is my port accessed? To access your port: A numbing cream may be placed on the skin over the port site. Your health care provider will put on a mask and sterile gloves. The skin over your port will be cleaned carefully with a germ-killing soap and allowed to dry. Your health care provider will gently pinch the port and insert a needle into it. Your health care provider will check for a blood return to make sure the port is in the vein and is still working (patent). If your port needs to remain accessed to get medicine continuously (constant infusion), your health care provider will place a clear bandage (dressing) over the needle site. The dressing and needle will need to be changed every week, or as told by your health care provider. What is flushing? Flushing helps keep the port working. Follow instructions from your health care provider about how and when to flush the port. Ports are usually flushed with saline solution or a medicine called heparin. The need for flushing will depend on how the port is used: If the port is only used from time to time to give medicines or draw blood, the port may need to be flushed: Before and after medicines have been given. Before and after blood has been drawn. As part of routine maintenance. Flushing may be recommended every 4-6 weeks. If a constant infusion is running, the port may not need to be flushed. Throw away any syringes in a disposal container that is meant for sharp items (sharps container). You can buy a sharps  container from a pharmacy, or you can make one by using an empty hard plastic bottle with a cover. How long will my port stay implanted? The port can stay in for as long as your health care provider thinks it is needed. When it is time for the port to come out, a surgery will be done to remove it. The surgery will be similar to the procedure that was done to put the port in. Follow these instructions at home: Caring for your port and port site Flush your port as told by your health care provider. If you need an infusion over several days, follow instructions from your health care provider about how to take care of your port site. Make sure you: Change your dressing as told by your health care provider. Wash your hands with soap and water for at least 20 seconds before and after you change your dressing. If soap and water are not available, use alcohol-based hand sanitizer. Place any used dressings or infusion bags into a plastic bag. Throw that bag in the trash. Keep  the dressing that covers the needle clean and dry. Do not get it wet. Do not use scissors or sharp objects near the infusion tubing. Keep any external tubes clamped, unless they are being used. Check your port site every day for signs of infection. Check for: Redness, swelling, or pain. Fluid or blood. Warmth. Pus or a bad smell. Protect the skin around the port site. Avoid wearing bra straps that rub or irritate the site. Protect the skin around your port from seat belts. Place a soft pad over your chest if needed. Bathe or shower as told by your health care provider. The site may get wet as long as you are not actively receiving an infusion. General instructions  Return to your normal activities as told by your health care provider. Ask your health care provider what activities are safe for you. Carry a medical alert card or wear a medical alert bracelet at all times. This will let health care providers know that you have an  implanted port in case of an emergency. Where to find more information American Cancer Society: www.cancer.[place] of Clinical Oncology: www.cancer.net Contact a health care provider if: You have a fever or chills. You have redness, swelling, or pain at the port site. You have fluid or blood coming from your port site. Your incision feels warm to the touch. You have pus or a bad smell coming from the port site. Summary Implanted ports are usually placed in the chest for long-term IV access. Follow instructions from your health care provider about flushing the port and changing bandages (dressings). Take care of the area around your port by avoiding clothing that puts pressure on the area, and by watching for signs of infection. Protect the skin around your port from seat belts. Place a soft pad over your chest if needed. Contact a health care provider if you have a fever or you have redness, swelling, pain, fluid, or a bad smell at the port site. This information is not intended to replace advice given to you by your health care provider. Make sure you discuss any questions you have with your health care provider. Document Revised: 05/05/2021 Document Reviewed: 05/05/2021 Elsevier Patient Education  [place].  Pegfilgrastim Injection What is this medication? PEGFILGRASTIM (PEG fil gra stim) lowers the risk of infection in people who are receiving chemotherapy. It works by Building control surveyor make more white blood cells, which protects your body from infection. It may also be used to help people who have been exposed to high doses of radiation. This medicine may be used for other purposes; ask your health care provider or pharmacist if you have questions. COMMON BRAND NAME(S): Rexene Edison, Ziextenzo What should I tell my care team before I take this medication? They need to know if you have any of these conditions: Kidney disease Latex  allergy Ongoing radiation therapy Sickle cell disease Skin reactions to acrylic adhesives (On-Body Injector only) An unusual or allergic reaction to pegfilgrastim, filgrastim, other medications, foods, dyes, or preservatives Pregnant or trying to get pregnant Breast-feeding How should I use this medication? This medication is for injection under the skin. If you get this medication at home, you will be taught how to prepare and give the pre-filled syringe or how to use the On-body Injector. Refer to the patient Instructions for Use for detailed instructions. Use exactly as directed. Tell your care team immediately if you suspect that the [place] may not have performed as  intended or if you suspect the use of the On-body Injector resulted in a missed or partial dose. It is important that you put your used needles and syringes in a special sharps container. Do not put them in a trash can. If you do not have a sharps container, call your pharmacist or care team to get one. Talk to your care team about the use of this medication in children. While this medication may be prescribed for selected conditions, precautions do apply. Overdosage: If you think you have taken too much of this medicine contact a poison control center or emergency room at once. NOTE: This medicine is only for you. Do not share this medicine with others. What if I miss a dose? It is important not to miss your dose. Call your care team if you miss your dose. If you miss a dose due to an On-body Injector failure or leakage, a new dose should be administered as soon as possible using a single prefilled syringe for manual use. What may interact with this medication? Interactions have not been studied. This list may not describe all possible interactions. Give your health care provider a list of all the medicines, herbs, non-prescription drugs, or dietary supplements you use. Also tell them if you smoke, drink alcohol, or use  illegal drugs. Some items may interact with your medicine. What should I watch for while using this medication? Your condition will be monitored carefully while you are receiving this medication. You may need blood work done while you are taking this medication. Talk to your care team about your risk of cancer. You may be more at risk for certain types of cancer if you take this medication. If you are going to need a MRI, CT scan, or other procedure, tell your care team that you are using this medication (On-Body Injector only). What side effects may I notice from receiving this medication? Side effects that you should report to your care team as soon as possible: Allergic reactions--skin rash, itching, hives, swelling of the face, lips, tongue, or throat Capillary leak syndrome--stomach or muscle pain, unusual weakness or fatigue, feeling faint or lightheaded, decrease in the amount of urine, swelling of the ankles, hands, or feet, trouble breathing High white blood cell level--fever, fatigue, trouble breathing, night sweats, change in vision, weight loss Inflammation of the aorta--fever, fatigue, back, chest, or stomach pain, severe headache Kidney injury (glomerulonephritis)--decrease in the amount of urine, red or dark brown urine, foamy or bubbly urine, swelling of the ankles, hands, or feet Shortness of breath or trouble breathing Spleen injury--pain in upper left stomach or shoulder Unusual bruising or bleeding Side effects that usually do not require medical attention (report to your care team if they continue or are bothersome): Bone pain Pain in the hands or feet This list may not describe all possible side effects. Call your doctor for medical advice about side effects. You may report side effects to FDA at 1-800-FDA-1088. Where should I keep my medication? Keep out of the reach of children. If you are using this medication at home, you will be instructed on how to store it. Throw  away any unused medication after the expiration date on the label. NOTE: This sheet is a summary. It may not cover all possible information. If you have questions about this medicine, talk to your doctor, pharmacist, or health care provider.  2022 Elsevier/Gold Standard (2021-07-21 00:00:00)

## 2021-12-04 ENCOUNTER — Encounter: Payer: Self-pay | Admitting: *Deleted

## 2021-12-04 NOTE — Progress Notes (Signed)
Faxed referral order, demographics, last office note and all pathology and molecular pathology to [facility] [phone].

## 2021-12-08 ENCOUNTER — Other Ambulatory Visit: Payer: Self-pay

## 2021-12-08 ENCOUNTER — Inpatient Hospital Stay: Payer: Medicare Other

## 2021-12-08 VITALS — BP 95/71 | HR 80 | Temp 97.7°F | Resp 18 | Wt 120.4 lb

## 2021-12-08 DIAGNOSIS — Z95828 Presence of other vascular implants and grafts: Secondary | ICD-10-CM

## 2021-12-08 DIAGNOSIS — Z5111 Encounter for antineoplastic chemotherapy: Secondary | ICD-10-CM | POA: Diagnosis not present

## 2021-12-08 DIAGNOSIS — D696 Thrombocytopenia, unspecified: Secondary | ICD-10-CM

## 2021-12-08 DIAGNOSIS — C250 Malignant neoplasm of head of pancreas: Secondary | ICD-10-CM

## 2021-12-08 LAB — CBC WITH DIFFERENTIAL (CANCER CENTER ONLY)
Abs Immature Granulocytes: 0.46 10*3/uL — ABNORMAL HIGH (ref 0.00–0.07)
Basophils Absolute: 0.1 10*3/uL (ref 0.0–0.1)
Basophils Relative: 1 %
Eosinophils Absolute: 0.2 10*3/uL (ref 0.0–0.5)
Eosinophils Relative: 2 %
HCT: 27.8 % — ABNORMAL LOW (ref 39.0–52.0)
Hemoglobin: 8.9 g/dL — ABNORMAL LOW (ref 13.0–17.0)
Immature Granulocytes: 6 %
Lymphocytes Relative: 5 %
Lymphs Abs: 0.4 10*3/uL — ABNORMAL LOW (ref 0.7–4.0)
MCH: 30.8 pg (ref 26.0–34.0)
MCHC: 32 g/dL (ref 30.0–36.0)
MCV: 96.2 fL (ref 80.0–100.0)
Monocytes Absolute: 0.2 10*3/uL (ref 0.1–1.0)
Monocytes Relative: 3 %
Neutro Abs: 6.4 10*3/uL (ref 1.7–7.7)
Neutrophils Relative %: 83 %
Platelet Count: 112 10*3/uL — ABNORMAL LOW (ref 150–400)
RBC: 2.89 MIL/uL — ABNORMAL LOW (ref 4.22–5.81)
RDW: 19.3 % — ABNORMAL HIGH (ref 11.5–15.5)
WBC Count: 7.7 10*3/uL (ref 4.0–10.5)
nRBC: 0 % (ref 0.0–0.2)

## 2021-12-08 LAB — SAMPLE TO BLOOD BANK

## 2021-12-08 MED ORDER — SODIUM CHLORIDE 0.9% FLUSH
10.0000 mL | INTRAVENOUS | Status: DC | PRN
  Administered 2021-12-08: 11:00:00 10 mL

## 2021-12-08 MED ORDER — ROMIPLOSTIM 125 MCG ~~LOC~~ SOLR
2.0000 ug/kg | Freq: Once | SUBCUTANEOUS | Status: AC
  Administered 2021-12-08: 12:00:00 110 ug via SUBCUTANEOUS
  Filled 2021-12-08: qty 0.22

## 2021-12-08 MED ORDER — HEPARIN SOD (PORK) LOCK FLUSH 100 UNIT/ML IV SOLN
500.0000 [IU] | Freq: Once | INTRAVENOUS | Status: AC | PRN
  Administered 2021-12-08: 11:00:00 500 [IU]

## 2021-12-08 NOTE — Patient Instructions (Signed)
Romiplostim injection ?What is this medication? ?ROMIPLOSTIM (roe mi PLOE stim) helps your body make more platelets. This medicine is used to treat low platelets caused by chronic idiopathic thrombocytopenic purpura (ITP) or a bone marrow syndrome caused by radiation sickness. ?This medicine may be used for other purposes; ask your health care provider or pharmacist if you have questions. ?COMMON BRAND NAME(S): Nplate ?What should I tell my care team before I take this medication? ?They need to know if you have any of these conditions: ?blood clots ?myelodysplastic syndrome ?an unusual or allergic reaction to romiplostim, mannitol, other medicines, foods, dyes, or preservatives ?pregnant or trying to get pregnant ?breast-feeding ?How should I use this medication? ?This medicine is injected under the skin. It is given by a health care provider in a hospital or clinic setting. ?A special MedGuide will be given to you before each treatment. Be sure to read this information carefully each time. ?Talk to your health care provider about the use of this medicine in children. While it may be prescribed for children as young as newborns for selected conditions, precautions do apply. ?Overdosage: If you think you have taken too much of this medicine contact a poison control center or emergency room at once. ?NOTE: This medicine is only for you. Do not share this medicine with others. ?What if I miss a dose? ?Keep appointments for follow-up doses. It is important not to miss your dose. Call your health care provider if you are unable to keep an appointment. ?What may interact with this medication? ?Interactions are not expected. ?This list may not describe all possible interactions. Give your health care provider a list of all the medicines, herbs, non-prescription drugs, or dietary supplements you use. Also tell them if you smoke, drink alcohol, or use illegal drugs. Some items may interact with your medicine. ?What should I  watch for while using this medication? ?Visit your health care provider for regular checks on your progress. You may need blood work done while you are taking this medicine. Your condition will be monitored carefully while you are receiving this medicine. It is important not to miss any appointments. ?What side effects may I notice from receiving this medication? ?Side effects that you should report to your doctor or health care professional as soon as possible: ?allergic reactions (skin rash, itching or hives; swelling of the face, lips, or tongue) ?bleeding (bloody or black, tarry stools; red or dark brown urine; spitting up blood or brown material that looks like coffee grounds; red spots on the skin; unusual bruising or bleeding from the eyes, gums, or nose) ?blood clot (chest pain; shortness of breath; pain, swelling, or warmth in the leg) ?stroke (changes in vision; confusion; trouble speaking or understanding; severe headaches; sudden numbness or weakness of the face, arm or leg; trouble walking; dizziness; loss of balance or coordination) ?Side effects that usually do not require medical attention (report to your doctor or health care professional if they continue or are bothersome): ?diarrhea ?dizziness ?headache ?joint pain ?muscle pain ?stomach pain ?trouble sleeping ?This list may not describe all possible side effects. Call your doctor for medical advice about side effects. You may report side effects to FDA at 1-800-FDA-1088. ?Where should I keep my medication? ?This medicine is given in a hospital or clinic. It will not be stored at home. ?NOTE: This sheet is a summary. It may not cover all possible information. If you have questions about this medicine, talk to your doctor, pharmacist, or health care provider. ??   2022 Elsevier/Gold Standard (2021-07-21 00:00:00) ? ?

## 2021-12-13 ENCOUNTER — Other Ambulatory Visit: Payer: Self-pay | Admitting: Oncology

## 2021-12-14 ENCOUNTER — Inpatient Hospital Stay: Payer: Medicare Other

## 2021-12-14 ENCOUNTER — Encounter: Payer: Self-pay | Admitting: Nurse Practitioner

## 2021-12-14 ENCOUNTER — Other Ambulatory Visit: Payer: Self-pay

## 2021-12-14 ENCOUNTER — Inpatient Hospital Stay (HOSPITAL_BASED_OUTPATIENT_CLINIC_OR_DEPARTMENT_OTHER): Payer: Medicare Other | Admitting: Nurse Practitioner

## 2021-12-14 ENCOUNTER — Inpatient Hospital Stay: Payer: Medicare Other | Admitting: Oncology

## 2021-12-14 VITALS — BP 105/60 | HR 75 | Temp 98.1°F | Resp 20 | Ht 69.0 in | Wt 116.5 lb

## 2021-12-14 DIAGNOSIS — D696 Thrombocytopenia, unspecified: Secondary | ICD-10-CM

## 2021-12-14 DIAGNOSIS — C250 Malignant neoplasm of head of pancreas: Secondary | ICD-10-CM | POA: Diagnosis not present

## 2021-12-14 DIAGNOSIS — Z5111 Encounter for antineoplastic chemotherapy: Secondary | ICD-10-CM | POA: Diagnosis not present

## 2021-12-14 DIAGNOSIS — Z95828 Presence of other vascular implants and grafts: Secondary | ICD-10-CM | POA: Diagnosis not present

## 2021-12-14 LAB — CBC WITH DIFFERENTIAL (CANCER CENTER ONLY)
Abs Immature Granulocytes: 0.06 10*3/uL (ref 0.00–0.07)
Basophils Absolute: 0.1 10*3/uL (ref 0.0–0.1)
Basophils Relative: 1 %
Eosinophils Absolute: 0.2 10*3/uL (ref 0.0–0.5)
Eosinophils Relative: 3 %
HCT: 25.2 % — ABNORMAL LOW (ref 39.0–52.0)
Hemoglobin: 8.1 g/dL — ABNORMAL LOW (ref 13.0–17.0)
Immature Granulocytes: 1 %
Lymphocytes Relative: 8 %
Lymphs Abs: 0.4 10*3/uL — ABNORMAL LOW (ref 0.7–4.0)
MCH: 30.7 pg (ref 26.0–34.0)
MCHC: 32.1 g/dL (ref 30.0–36.0)
MCV: 95.5 fL (ref 80.0–100.0)
Monocytes Absolute: 0.3 10*3/uL (ref 0.1–1.0)
Monocytes Relative: 5 %
Neutro Abs: 4.7 10*3/uL (ref 1.7–7.7)
Neutrophils Relative %: 82 %
Platelet Count: 108 10*3/uL — ABNORMAL LOW (ref 150–400)
RBC: 2.64 MIL/uL — ABNORMAL LOW (ref 4.22–5.81)
RDW: 19 % — ABNORMAL HIGH (ref 11.5–15.5)
WBC Count: 5.7 10*3/uL (ref 4.0–10.5)
nRBC: 0 % (ref 0.0–0.2)

## 2021-12-14 LAB — CMP (CANCER CENTER ONLY)
ALT: 33 U/L (ref 0–44)
AST: 24 U/L (ref 15–41)
Albumin: 3.7 g/dL (ref 3.5–5.0)
Alkaline Phosphatase: 293 U/L — ABNORMAL HIGH (ref 38–126)
Anion gap: 10 (ref 5–15)
BUN: 13 mg/dL (ref 8–23)
CO2: 17 mmol/L — ABNORMAL LOW (ref 22–32)
Calcium: 8.4 mg/dL — ABNORMAL LOW (ref 8.9–10.3)
Chloride: 114 mmol/L — ABNORMAL HIGH (ref 98–111)
Creatinine: 1.23 mg/dL (ref 0.61–1.24)
GFR, Estimated: >60 mL/min (ref >60)
Glucose, Bld: 151 mg/dL — ABNORMAL HIGH (ref 70–99)
Potassium: 3.2 mmol/L — ABNORMAL LOW (ref 3.5–5.1)
Sodium: 141 mmol/L (ref 135–145)
Total Bilirubin: 0.7 mg/dL (ref 0.3–1.2)
Total Protein: 6.4 g/dL — ABNORMAL LOW (ref 6.5–8.1)

## 2021-12-14 LAB — SAMPLE TO BLOOD BANK

## 2021-12-14 MED ORDER — DIPHENOXYLATE-ATROPINE 2.5-0.025 MG PO TABS: 1.0000 | ORAL_TABLET | Freq: Four times a day (QID) | ORAL | 1 refills | Status: DC | PRN

## 2021-12-14 MED ORDER — HEPARIN SOD (PORK) LOCK FLUSH 100 UNIT/ML IV SOLN
500.0000 [IU] | Freq: Once | INTRAVENOUS | Status: AC
  Administered 2021-12-14: 500 [IU] via INTRAVENOUS

## 2021-12-14 MED ORDER — SODIUM CHLORIDE 0.9% FLUSH
10.0000 mL | INTRAVENOUS | Status: DC | PRN
  Administered 2021-12-14: 10 mL via INTRAVENOUS

## 2021-12-14 NOTE — Patient Instructions (Signed)
Your appointment with Dr. Berneice Gandy at [facility] is 12/22/21 at 11:00--arrive at 10:30 am. They will be calling you later today with specifics. If you do not hear from them call [phone] to follow up.

## 2021-12-14 NOTE — Progress Notes (Signed)
[facility] OFFICE PROGRESS NOTE   Diagnosis: Pancreas cancer  INTERVAL HISTORY:   Aaron Johnston returns for follow-up.  He completed another cycle of 5-FU/liposomal irinotecan 12/01/2021.  He denies nausea/vomiting.  No mouth sores.  Stools more formed, continue to be frequent.  Tailbone discomfort is better.  Objective:  Vital signs in last 24 hours:  Blood pressure 105/60, pulse 75, temperature 98.1 F (36.7 C), temperature source Oral, resp. rate 20, height '5\' 9"'  (1.753 m), weight 116 lb 8 oz (52.8 kg), SpO2 97 %.    HEENT: No thrush or ulcers. Resp: Lungs clear bilaterally. Cardio: Regular rate and rhythm. GI: Abdomen soft and nontender.  No hepatomegaly. Vascular: No leg edema. Skin: Palms without erythema. Port-A-Cath without erythema.   Lab Results:  Lab Results  Component Value Date   WBC 5.7 12/14/2021   HGB 8.1 (L) 12/14/2021   HCT 25.2 (L) 12/14/2021   MCV 95.5 12/14/2021   PLT 108 (L) 12/14/2021   NEUTROABS 4.7 12/14/2021    Imaging:  No results found.  Medications: I have reviewed the patient's current medications.  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, mild biliary ductal dilatation, diffuse hypodense/hypoenhancing areas within the liver (edema versus infiltrating neoplasm), 2 x 1.7 cm area of focal prominence in the pancreas EUS 07/10/2020-no pancreas mass identified, 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, 1.5 cm aortocaval node biopsy-scant lymphoid material, no malignancy Diagnostic laparoscopy 07/17/2020-segment 3 and 4 liver nodules, adenocarcinoma,  positive for pankeratin, CK7 and CK20, partially positive for TTF-1, negative for CDX2 Foundation 1-microsatellite stable, tumor mutation burden 4, K-ras G12D, subclonal RB1 alteration Elevated CA 19-9 Cycle 1 FOLFOX 07/28/2020 Cycle 2 FOLFOX 08/18/2020, oxaliplatin dose reduced due to thrombocytopenia Cycle 3 FOLFOX 09/08/2020 Cycle 4 FOLFOX 09/29/2020  Cycle 5 FOLFOX 10/20/2020 CTs 11/06/2020-grossly stable infiltrative mass within the pancreatic head and porta hepatis.  New small low-density liver lesions.  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 Cycle 6 FOLFOX 11/10/2021  Cycle 7 FOLFOX 12/01/2020 MRI liver 12/04/2020-no significant change in posttreatment appearance of the pancreatic head.  Numerous intrinsically low signal hypoenhancing lesions of the liver parenchyma predominantly observed in the right lobe of the liver and liver dome, measuring no greater than 8 mm and as seen on recent prior CT of the abdomen/pelvis. Cycle 8 FOLFOX 12/22/2020 Cycle 9 FOLFOX 01/12/2021 Cycle 10 FOLFOX 02/02/2021 MRI liver 02/19/2021-no change in pancreas head mass, multiple hypoenhancing liver lesions are new and increased in size, effacement of portal vein by pancreas head mass with cavernous transformation, no change in mild splenomegaly Cycle 1 gemcitabine/Abraxane 03/12/2021 Cycle 2 gemcitabine/Abraxane 03/27/2021- dose reductions secondary to neutropenia Cycle 3 gemcitabine/Abraxane 04/17/2021-dose reductions due to thrombocytopenia, white cell growth factor support added Cycle 4 gemcitabine/Abraxane 05/01/2021, Ziextenzo Cycle 5 gemcitabine/Abraxane 05/15/2021, Ziextenzo MRI abdomen 05/26/2021-mild enlargement of dominant hepatic metastases, appearance of infiltrative pancreas mass extending to the celiac trunk and SMA, splenic vein occlusion, cavernous transformation of the portal vein with  upper abdomen collaterals, splenomegaly, mild upper abdominal ascites and mesenteric edema, minimal subpleural nodularity left lower lobe Cycle 6 gemcitabine/Abraxane 05/28/2021 Cycle  7 gemcitabine/Abraxane 06/12/2021 Cycle 8 gemcitabine/Abraxane 06/26/2021 Cycle 9 gemcitabine/Abraxane 07/10/2021 Cycle 10 gemcitabine/Abraxane 07/23/2021 Cycle 11 gemcitabine/Abraxane 08/06/2021 MRI abdomen 08/20/2021-multifocal nodularity in the lower chest with a new discrete right lower lobe nodule, enlarging subsegment 7/8 liver lesion, other small lesions appear stable, gastric varices, review of images with radiology-enlargement of at least 2 liver lesions, suspicious nodule n the right lower lung Cycle 1  5-FU/liposomal irinotecan 09/08/2021 Cycle 2  5-FU/liposomal irinotecan 09/22/2021 Cycle 3  5-FU/liposomal irinotecan 10/20/2021 Cycle 4 5-FU/liposomal irinotecan 11/04/2021 Cycle 5 5-FU/liposomal irinotecan 11/17/2021 MRI abdomen 11/27/2021-generally unchanged appearance of the pancreas with a very ill-defined hypoenhancing mass centered within the pancreatic neck, again appears to encase the celiac axis and encasing occlude the superior mesenteric vein and splenic vein; interval enlargement of multiple liver lesions; numerous lung nodules in the bilateral lung bases; unchanged splenomegaly; multiple new heterogeneously and hypoenhancing subcapsular lesions of the spleen most consistent with infarctions; small volume ascites throughout the abdomen; distended, debris-filled stomach suggesting some degree of gastric outlet and/or duodenal obstruction. 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   Admission with recurrent GI bleeding and severe anemia 09/15/2021 Upper endoscopy 09/17/2021-esophageal varices with stigmata of recent bleeding, banded.  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  Influenza A 10/13/2021  Disposition: Aaron Johnston appears unchanged.  He has completed 6 cycles of 5-FU/liposomal irinotecan.  Plan to proceed with cycle 7 as scheduled 12/15/2021.  He has an appointment with Dr. Berneice Gandy at [facility] 12/22/2021.  We reviewed the CBC from today.  He has progressive anemia.  He does not feel he needs a blood transfusion this week.  Plan for follow-up CBC, possible transfusion in 1 week.  The platelet count is stable.  Plan to continue weekly Nplate.  He will return for an office visit and treatment in 2 weeks.  We are available to see him sooner if needed.    [name] ANP/GNP-BC   12/14/2021  12:06 PM

## 2021-12-15 ENCOUNTER — Encounter (HOSPITAL_COMMUNITY): Payer: Self-pay | Admitting: Emergency Medicine

## 2021-12-15 ENCOUNTER — Inpatient Hospital Stay: Payer: Medicare Other

## 2021-12-15 VITALS — BP 90/60 | HR 67 | Temp 98.0°F | Resp 18

## 2021-12-15 DIAGNOSIS — D696 Thrombocytopenia, unspecified: Secondary | ICD-10-CM

## 2021-12-15 DIAGNOSIS — Z5111 Encounter for antineoplastic chemotherapy: Secondary | ICD-10-CM | POA: Diagnosis not present

## 2021-12-15 DIAGNOSIS — Z95828 Presence of other vascular implants and grafts: Secondary | ICD-10-CM

## 2021-12-15 DIAGNOSIS — C250 Malignant neoplasm of head of pancreas: Secondary | ICD-10-CM

## 2021-12-15 LAB — CANCER ANTIGEN 19-9: CA 19-9: 3813 U/mL — ABNORMAL HIGH (ref 0–35)

## 2021-12-15 MED ORDER — ROMIPLOSTIM 125 MCG ~~LOC~~ SOLR
2.0000 ug/kg | Freq: Once | SUBCUTANEOUS | Status: AC
  Administered 2021-12-15: 105 ug via SUBCUTANEOUS
  Filled 2021-12-15: qty 0.21

## 2021-12-15 MED ORDER — SODIUM CHLORIDE 0.9 % IV SOLN
1800.0000 mg/m2 | INTRAVENOUS | Status: DC
  Administered 2021-12-15: 3050 mg via INTRAVENOUS
  Filled 2021-12-15: qty 61

## 2021-12-15 MED ORDER — SODIUM CHLORIDE 0.9 % IV SOLN
300.0000 mg/m2 | Freq: Once | INTRAVENOUS | Status: AC
  Administered 2021-12-15: 508 mg via INTRAVENOUS
  Filled 2021-12-15: qty 25.4

## 2021-12-15 MED ORDER — SODIUM CHLORIDE 0.9 % IV SOLN
10.0000 mg | Freq: Once | INTRAVENOUS | Status: AC
  Administered 2021-12-15: 10 mg via INTRAVENOUS
  Filled 2021-12-15: qty 1

## 2021-12-15 MED ORDER — SODIUM CHLORIDE 0.9 % IV SOLN: Freq: Once | INTRAVENOUS | Status: AC

## 2021-12-15 MED ORDER — SODIUM CHLORIDE 0.9 % IV SOLN
40.0000 mg/m2 | Freq: Once | INTRAVENOUS | Status: AC
  Administered 2021-12-15: 68.8 mg via INTRAVENOUS
  Filled 2021-12-15: qty 16

## 2021-12-15 MED ORDER — PALONOSETRON HCL INJECTION 0.25 MG/5ML
0.2500 mg | Freq: Once | INTRAVENOUS | Status: AC
  Administered 2021-12-15: 0.25 mg via INTRAVENOUS
  Filled 2021-12-15: qty 5

## 2021-12-15 NOTE — Progress Notes (Signed)
PA completed for [facility] for diphenoxylate-atropine 2.5-0.25 mg.  Approved through 11/14/2022.

## 2021-12-15 NOTE — Progress Notes (Signed)
Patient presents for treatment. RN assessment completed along with the following:  Labs/vitals reviewed - Yes, and within treatment parameters.   Weight within 10% of previous measurement - Yes Informed consent completed and reflects current therapy/intent - Yes, on date 09/08/2021             Provider progress note reviewed - Patient not seen by provider today. Most recent note dated 12/14/2021 reviewed. Treatment/Antibody/Supportive plan reviewed - Yes, and there are no adjustments needed for today's treatment. S&H and other orders reviewed - Yes, and there are no additional orders identified. Previous treatment date reviewed - Yes, and the appropriate amount of time has elapsed between treatments. Clinic Hand Off Received from - No  Patient to proceed with treatment.

## 2021-12-15 NOTE — Patient Instructions (Signed)
Aaron Johnston   Discharge Instructions: Thank you for choosing [facility] to provide your oncology and hematology care.   If you have a lab appointment with the [facility], please go directly to the [facility] and check in at the registration area.   Wear comfortable clothing and clothing appropriate for easy access to any Portacath or PICC line.   We strive to give you quality time with your provider. You may need to reschedule your appointment if you arrive late (15 or more minutes).  Arriving late affects you and other patients whose appointments are after yours.  Also, if you miss three or more appointments without notifying the office, you may be dismissed from the clinic at the providers discretion.      For prescription refill requests, have your pharmacy contact our office and allow 72 hours for refills to be completed.    Today you received the following chemotherapy and/or immunotherapy agents Irinotecan Liposomal (ONIVYDE), Leucovorin & Flourouracil (ADRUCIL).      To help prevent nausea and vomiting after your treatment, we encourage you to take your nausea medication as directed.  BELOW ARE SYMPTOMS THAT SHOULD BE REPORTED IMMEDIATELY: *FEVER GREATER THAN 100.4 F (38 C) OR HIGHER *CHILLS OR SWEATING *NAUSEA AND VOMITING THAT IS NOT CONTROLLED WITH YOUR NAUSEA MEDICATION *UNUSUAL SHORTNESS OF BREATH *UNUSUAL BRUISING OR BLEEDING *URINARY PROBLEMS (pain or burning when urinating, or frequent urination) *BOWEL PROBLEMS (unusual diarrhea, constipation, pain near the anus) TENDERNESS IN MOUTH AND THROAT WITH OR WITHOUT PRESENCE OF ULCERS (sore throat, sores in mouth, or a toothache) UNUSUAL RASH, SWELLING OR PAIN  UNUSUAL VAGINAL DISCHARGE OR ITCHING   Items with * indicate a potential emergency and should be followed up as soon as possible or go to the Emergency Department if any problems should occur.  Please show the  CHEMOTHERAPY ALERT CARD or IMMUNOTHERAPY ALERT CARD at check-in to the Emergency Department and triage nurse.  Should you have questions after your visit or need to cancel or reschedule your appointment, please contact [facility]  Dept: [phone]  and follow the prompts.  Office hours are 8:00 a.m. to 4:30 p.m. Monday - Friday. Please note that voicemails left after 4:00 p.m. may not be returned until the following business day.  We are closed weekends and major holidays. You have access to a nurse at all times for urgent questions. Please call the main number to the clinic Dept: [phone] and follow the prompts.   For any non-urgent questions, you may also contact your provider using MyChart. We now offer e-Visits for anyone 76 and older to request care online for non-urgent symptoms. For details visit [email or web address].   Also download the MyChart app! Go to the app store, search "MyChart", open the app, select [facility], and log in with your MyChart username and password.  Due to Covid, a mask is required upon entering the hospital/clinic. If you do not have a mask, one will be given to you upon arrival. For doctor visits, patients may have 1 support person aged 81 or older with them. For treatment visits, patients cannot have anyone with them due to current Covid guidelines and our immunocompromised population.   Irinotecan Liposomal injection What is this medication? IRINOTECAN LIPOSOME (eye ri noe TEE kan LIP oh som) is a chemotherapy drug. It is used to treat pancreatic cancer. This medicine may be used for other purposes; ask your health care provider or  pharmacist if you have questions. COMMON BRAND NAME(S): ONIVYDE What should I tell my care team before I take this medication? They need to know if you have any of these conditions: bleeding disorders dehydration history of blood diseases, like sickle cell anemia or leukemia history of low levels  of calcium, magnesium, or potassium in the blood infection (especially a virus infection such as chickenpox, cold sores, or herpes) liver disease low blood counts, like low white cell, platelet, or red cell counts lung or breathing disease, like asthma recent or ongoing radiation therapy an unusual or allergic reaction to irinotecan liposome, other medicines, foods, dyes, or preservatives pregnant or trying to get pregnant breast-feeding How should I use this medication? This drug is given as an infusion into a vein. It is administered in a hospital or clinic by a specially trained health care professional. Talk to your pediatrician regarding the use of this medicine in children. Special care may be needed. Overdosage: If you think you have taken too much of this medicine contact a poison control center or emergency room at once. NOTE: This medicine is only for you. Do not share this medicine with others. What if I miss a dose? It is important not to miss your dose. Call your doctor or health care professional if you are unable to keep an appointment. What may interact with this medication? This medicine may interact with the following medications: antiviral medicines for HIV or AIDS certain medications for fungal infections like ketoconazole, itraconazole, voriconazole certain medications for seizures like carbamazepine, fosphenytoin, phenytoin clarithromycin gemfibrozil mephobarbital nefazodone phenobarbital primidone rifabutin rifampin rifapentine [facility] John's Wort telaprevir This list may not describe all possible interactions. Give your health care provider a list of all the medicines, herbs, non-prescription drugs, or dietary supplements you use. Also tell them if you smoke, drink alcohol, or use illegal drugs. Some items may interact with your medicine. What should I watch for while using this medication? Check with your doctor or health care professional if you get an attack  of severe diarrhea, nausea and vomiting, or if you sweat a lot. The loss of too much body fluid can make it dangerous for you to take this medicine. This medicine may interfere with the ability to have a child. You should talk with your doctor or health care professional if you are concerned about your fertility. Do not become pregnant while taking this medicine or for 1 month after the last dose; males with male partners should use condoms during treatment and for 4 months after the last dose. Women should inform their doctor if they wish to become pregnant or think they might be pregnant. There is a potential for serious side effects to an unborn child. Talk to your health care professional or pharmacist for more information. Do not breast-feed an infant while taking this medicine or for 1 month after the last dose. Avoid taking products that contain aspirin, acetaminophen, ibuprofen, naproxen, or ketoprofen unless instructed by your doctor. These medicines may hide a fever. Be careful brushing and flossing your teeth or using a toothpick because you may get an infection or bleed more easily. If you have any dental work done, tell your dentist you are receiving this medicine. Call your doctor or health care professional for advice if you get a fever, chills or sore throat, or other symptoms of a cold or flu. Do not treat yourself. This drug decreases your body's ability to fight infections. Try to avoid being around people who are  sick. This medicine may increase your risk to bruise or bleed. Call your doctor or health care professional if you notice any unusual bleeding. This drug may make you feel generally unwell. This is not uncommon, as chemotherapy can affect healthy cells as well as cancer cells. Report any side effects. Continue your course of treatment even though you feel ill unless your doctor tells you to stop. What side effects may I notice from receiving this medication? Side effects that  you should report to your doctor or health care professional as soon as possible: allergic reactions like skin rash, itching or hives, swelling of the face, lips, or tongue breathing problems cough diarrhea low blood counts - this medicine may decrease the number of white blood cells, red blood cells and platelets. You may be at increased risk for infections and bleeding nausea, vomiting signs and symptoms of bleeding such as bloody or black, tarry stools; red or dark-brown urine; spitting up blood or brown material that looks like coffee grounds; red spots on the skin; unusual bruising or bleeding from the eye, gums, or nose signs of decreased red blood cells - unusually weak or tired, feeling faint or lightheaded, falls signs and symptoms of infection like fever or chills; cough; sore throat; pain or trouble passing urine signs and symptoms of liver injury like dark yellow or brown urine; general ill feeling or flu-like symptoms; light-colored stools; loss of appetite; nausea; right upper belly pain; unusually weak or tired; yellowing of the eyes or skin stomach pain Side effects that usually do not require medical attention (report to your doctor or health care professional if they continue or are bothersome): hair loss loss of appetite mouth sores upset stomach This list may not describe all possible side effects. Call your doctor for medical advice about side effects. You may report side effects to FDA at 1-800-FDA-1088. Where should I keep my medication? This drug is given in a hospital or clinic and will not be stored at home. NOTE: This sheet is a summary. It may not cover all possible information. If you have questions about this medicine, talk to your doctor, pharmacist, or health care provider.  2022 Elsevier/Gold Standard (2015-12-04 00:00:00)  Leucovorin injection What is this medication? LEUCOVORIN (loo koe VOR in) is used to prevent or treat the harmful effects of some  medicines. This medicine is used to treat anemia caused by a low amount of folic acid in the body. It is also used with 5-fluorouracil (5-FU) to treat colon cancer. This medicine may be used for other purposes; ask your health care provider or pharmacist if you have questions. What should I tell my care team before I take this medication? They need to know if you have any of these conditions: anemia from low levels of vitamin B-12 in the blood an unusual or allergic reaction to leucovorin, folic acid, other medicines, foods, dyes, or preservatives pregnant or trying to get pregnant breast-feeding How should I use this medication? This medicine is for injection into a muscle or into a vein. It is given by a health care professional in a hospital or clinic setting. Talk to your pediatrician regarding the use of this medicine in children. Special care may be needed. Overdosage: If you think you have taken too much of this medicine contact a poison control center or emergency room at once. NOTE: This medicine is only for you. Do not share this medicine with others. What if I miss a dose? This does not apply.  What may interact with this medication? capecitabine fluorouracil phenobarbital phenytoin primidone trimethoprim-sulfamethoxazole This list may not describe all possible interactions. Give your health care provider a list of all the medicines, herbs, non-prescription drugs, or dietary supplements you use. Also tell them if you smoke, drink alcohol, or use illegal drugs. Some items may interact with your medicine. What should I watch for while using this medication? Your condition will be monitored carefully while you are receiving this medicine. This medicine may increase the side effects of 5-fluorouracil, 5-FU. Tell your doctor or health care professional if you have diarrhea or mouth sores that do not get better or that get worse. What side effects may I notice from receiving this  medication? Side effects that you should report to your doctor or health care professional as soon as possible: allergic reactions like skin rash, itching or hives, swelling of the face, lips, or tongue breathing problems fever, infection mouth sores unusual bleeding or bruising unusually weak or tired Side effects that usually do not require medical attention (report to your doctor or health care professional if they continue or are bothersome): constipation or diarrhea loss of appetite nausea, vomiting This list may not describe all possible side effects. Call your doctor for medical advice about side effects. You may report side effects to FDA at 1-800-FDA-1088. Where should I keep my medication? This drug is given in a hospital or clinic and will not be stored at home. NOTE: This sheet is a summary. It may not cover all possible information. If you have questions about this medicine, talk to your doctor, pharmacist, or health care provider.  2022 Elsevier/Gold Standard (2008-05-09 00:00:00)  Fluorouracil, 5-FU injection What is this medication? FLUOROURACIL, 5-FU (flure oh YOOR a sil) is a chemotherapy drug. It slows the growth of cancer cells. This medicine is used to treat many types of cancer like breast cancer, colon or rectal cancer, pancreatic cancer, and stomach cancer. This medicine may be used for other purposes; ask your health care provider or pharmacist if you have questions. COMMON BRAND NAME(S): Adrucil What should I tell my care team before I take this medication? They need to know if you have any of these conditions: blood disorders dihydropyrimidine dehydrogenase (DPD) deficiency infection (especially a virus infection such as chickenpox, cold sores, or herpes) kidney disease liver disease malnourished, poor nutrition recent or ongoing radiation therapy an unusual or allergic reaction to fluorouracil, other chemotherapy, other medicines, foods, dyes, or  preservatives pregnant or trying to get pregnant breast-feeding How should I use this medication? This drug is given as an infusion or injection into a vein. It is administered in a hospital or clinic by a specially trained health care professional. Talk to your pediatrician regarding the use of this medicine in children. Special care may be needed. Overdosage: If you think you have taken too much of this medicine contact a poison control center or emergency room at once. NOTE: This medicine is only for you. Do not share this medicine with others. What if I miss a dose? It is important not to miss your dose. Call your doctor or health care professional if you are unable to keep an appointment. What may interact with this medication? Do not take this medicine with any of the following medications: live virus vaccines This medicine may also interact with the following medications: medicines that treat or prevent blood clots like warfarin, enoxaparin, and dalteparin This list may not describe all possible interactions. Give your health care provider  a list of all the medicines, herbs, non-prescription drugs, or dietary supplements you use. Also tell them if you smoke, drink alcohol, or use illegal drugs. Some items may interact with your medicine. What should I watch for while using this medication? Visit your doctor for checks on your progress. This drug may make you feel generally unwell. This is not uncommon, as chemotherapy can affect healthy cells as well as cancer cells. Report any side effects. Continue your course of treatment even though you feel ill unless your doctor tells you to stop. In some cases, you may be given additional medicines to help with side effects. Follow all directions for their use. Call your doctor or health care professional for advice if you get a fever, chills or sore throat, or other symptoms of a cold or flu. Do not treat yourself. This drug decreases your body's  ability to fight infections. Try to avoid being around people who are sick. This medicine may increase your risk to bruise or bleed. Call your doctor or health care professional if you notice any unusual bleeding. Be careful brushing and flossing your teeth or using a toothpick because you may get an infection or bleed more easily. If you have any dental work done, tell your dentist you are receiving this medicine. Avoid taking products that contain aspirin, acetaminophen, ibuprofen, naproxen, or ketoprofen unless instructed by your doctor. These medicines may hide a fever. Do not become pregnant while taking this medicine. Women should inform their doctor if they wish to become pregnant or think they might be pregnant. There is a potential for serious side effects to an unborn child. Talk to your health care professional or pharmacist for more information. Do not breast-feed an infant while taking this medicine. Men should inform their doctor if they wish to father a child. This medicine may lower sperm counts. Do not treat diarrhea with over the counter products. Contact your doctor if you have diarrhea that lasts more than 2 days or if it is severe and watery. This medicine can make you more sensitive to the sun. Keep out of the sun. If you cannot avoid being in the sun, wear protective clothing and use sunscreen. Do not use sun lamps or tanning beds/booths. What side effects may I notice from receiving this medication? Side effects that you should report to your doctor or health care professional as soon as possible: allergic reactions like skin rash, itching or hives, swelling of the face, lips, or tongue low blood counts - this medicine may decrease the number of white blood cells, red blood cells and platelets. You may be at increased risk for infections and bleeding. signs of infection - fever or chills, cough, sore throat, pain or difficulty passing urine signs of decreased platelets or  bleeding - bruising, pinpoint red spots on the skin, black, tarry stools, blood in the urine signs of decreased red blood cells - unusually weak or tired, fainting spells, lightheadedness breathing problems changes in vision chest pain mouth sores nausea and vomiting pain, swelling, redness at site where injected pain, tingling, numbness in the hands or feet redness, swelling, or sores on hands or feet stomach pain unusual bleeding Side effects that usually do not require medical attention (report to your doctor or health care professional if they continue or are bothersome): changes in finger or toe nails diarrhea dry or itchy skin hair loss headache loss of appetite sensitivity of eyes to the light stomach upset unusually teary eyes This list may  not describe all possible side effects. Call your doctor for medical advice about side effects. You may report side effects to FDA at 1-800-FDA-1088. Where should I keep my medication? This drug is given in a hospital or clinic and will not be stored at home. NOTE: This sheet is a summary. It may not cover all possible information. If you have questions about this medicine, talk to your doctor, pharmacist, or health care provider.  2022 Elsevier/Gold Standard (2021-07-21 00:00:00)  Romiplostim injection What is this medication? ROMIPLOSTIM (roe mi PLOE stim) helps your body make more platelets. This medicine is used to treat low platelets caused by chronic idiopathic thrombocytopenic purpura (ITP) or a bone marrow syndrome caused by radiation sickness. This medicine may be used for other purposes; ask your health care provider or pharmacist if you have questions. COMMON BRAND NAME(S): Nplate What should I tell my care team before I take this medication? They need to know if you have any of these conditions: blood clots myelodysplastic syndrome an unusual or allergic reaction to romiplostim, mannitol, other medicines, foods, dyes, or  preservatives pregnant or trying to get pregnant breast-feeding How should I use this medication? This medicine is injected under the skin. It is given by a health care provider in a hospital or clinic setting. A special MedGuide will be given to you before each treatment. Be sure to read this information carefully each time. Talk to your health care provider about the use of this medicine in children. While it may be prescribed for children as young as newborns for selected conditions, precautions do apply. Overdosage: If you think you have taken too much of this medicine contact a poison control center or emergency room at once. NOTE: This medicine is only for you. Do not share this medicine with others. What if I miss a dose? Keep appointments for follow-up doses. It is important not to miss your dose. Call your health care provider if you are unable to keep an appointment. What may interact with this medication? Interactions are not expected. This list may not describe all possible interactions. Give your health care provider a list of all the medicines, herbs, non-prescription drugs, or dietary supplements you use. Also tell them if you smoke, drink alcohol, or use illegal drugs. Some items may interact with your medicine. What should I watch for while using this medication? Visit your health care provider for regular checks on your progress. You may need blood work done while you are taking this medicine. Your condition will be monitored carefully while you are receiving this medicine. It is important not to miss any appointments. What side effects may I notice from receiving this medication? Side effects that you should report to your doctor or health care professional as soon as possible: allergic reactions (skin rash, itching or hives; swelling of the face, lips, or tongue) bleeding (bloody or black, tarry stools; red or dark brown urine; spitting up blood or brown material that looks  like coffee grounds; red spots on the skin; unusual bruising or bleeding from the eyes, gums, or nose) blood clot (chest pain; shortness of breath; pain, swelling, or warmth in the leg) stroke (changes in vision; confusion; trouble speaking or understanding; severe headaches; sudden numbness or weakness of the face, arm or leg; trouble walking; dizziness; loss of balance or coordination) Side effects that usually do not require medical attention (report to your doctor or health care professional if they continue or are bothersome): diarrhea dizziness headache joint pain muscle  pain stomach pain trouble sleeping This list may not describe all possible side effects. Call your doctor for medical advice about side effects. You may report side effects to FDA at 1-800-FDA-1088. Where should I keep my medication? This medicine is given in a hospital or clinic. It will not be stored at home. NOTE: This sheet is a summary. It may not cover all possible information. If you have questions about this medicine, talk to your doctor, pharmacist, or health care provider.  2022 Elsevier/Gold Standard (2021-07-21 00:00:00)  The chemotherapy medication bag should finish at 46 hours, 96 hours, or 7 days. For example, if your pump is scheduled for 46 hours and it was put on at 4:00 p.m., it should finish at 2:00 p.m. the day it is scheduled to come off regardless of your appointment time.     Estimated time to finish at 10:30 a.m. on Thursday 12/17/2021.   If the display on your pump reads "Low Volume" and it is beeping, take the batteries out of the pump and come to the cancer center for it to be taken off.   If the pump alarms go off prior to the pump reading "Low Volume" then call [phone] and someone can assist you.  If the plunger comes out and the chemotherapy medication is leaking out, please use your home chemo spill kit to clean up the spill. Do NOT use paper towels or other household  products.  If you have problems or questions regarding your pump, please call either [phone] (24 hours a day) or the cancer center Monday-Friday 8:00 a.m.- 4:30 p.m. at the clinic number and we will assist you. If you are unable to get assistance, then go to the nearest Emergency Department and ask the staff to contact the IV team for assistance.

## 2021-12-16 ENCOUNTER — Encounter: Payer: Self-pay | Admitting: Nurse Practitioner

## 2021-12-16 ENCOUNTER — Inpatient Hospital Stay: Payer: Medicare Other

## 2021-12-16 ENCOUNTER — Telehealth: Payer: Self-pay | Admitting: *Deleted

## 2021-12-16 NOTE — Telephone Encounter (Signed)
Notified Mr. Salls to increase his KCL to bid.

## 2021-12-17 ENCOUNTER — Other Ambulatory Visit: Payer: Self-pay

## 2021-12-17 ENCOUNTER — Inpatient Hospital Stay: Payer: Medicare Other | Attending: Genetic Counselor

## 2021-12-17 VITALS — BP 94/49 | HR 68 | Temp 97.5°F | Resp 18

## 2021-12-17 DIAGNOSIS — E119 Type 2 diabetes mellitus without complications: Secondary | ICD-10-CM | POA: Diagnosis not present

## 2021-12-17 DIAGNOSIS — C250 Malignant neoplasm of head of pancreas: Secondary | ICD-10-CM | POA: Insufficient documentation

## 2021-12-17 DIAGNOSIS — D709 Neutropenia, unspecified: Secondary | ICD-10-CM | POA: Diagnosis not present

## 2021-12-17 DIAGNOSIS — I10 Essential (primary) hypertension: Secondary | ICD-10-CM | POA: Diagnosis not present

## 2021-12-17 DIAGNOSIS — R197 Diarrhea, unspecified: Secondary | ICD-10-CM | POA: Insufficient documentation

## 2021-12-17 DIAGNOSIS — C787 Secondary malignant neoplasm of liver and intrahepatic bile duct: Secondary | ICD-10-CM | POA: Diagnosis present

## 2021-12-17 DIAGNOSIS — M549 Dorsalgia, unspecified: Secondary | ICD-10-CM | POA: Diagnosis not present

## 2021-12-17 DIAGNOSIS — Z5111 Encounter for antineoplastic chemotherapy: Secondary | ICD-10-CM | POA: Insufficient documentation

## 2021-12-17 DIAGNOSIS — N289 Disorder of kidney and ureter, unspecified: Secondary | ICD-10-CM | POA: Insufficient documentation

## 2021-12-17 DIAGNOSIS — Z5189 Encounter for other specified aftercare: Secondary | ICD-10-CM | POA: Diagnosis not present

## 2021-12-17 DIAGNOSIS — D696 Thrombocytopenia, unspecified: Secondary | ICD-10-CM | POA: Insufficient documentation

## 2021-12-17 MED ORDER — SODIUM CHLORIDE 0.9% FLUSH
10.0000 mL | INTRAVENOUS | Status: DC | PRN
  Administered 2021-12-17: 10 mL

## 2021-12-17 MED ORDER — HEPARIN SOD (PORK) LOCK FLUSH 100 UNIT/ML IV SOLN
500.0000 [IU] | Freq: Once | INTRAVENOUS | Status: AC | PRN
  Administered 2021-12-17: 500 [IU]

## 2021-12-17 MED ORDER — PEGFILGRASTIM-BMEZ 6 MG/0.6ML ~~LOC~~ SOSY
6.0000 mg | PREFILLED_SYRINGE | Freq: Once | SUBCUTANEOUS | Status: AC
  Administered 2021-12-17: 6 mg via SUBCUTANEOUS
  Filled 2021-12-17: qty 0.6

## 2021-12-17 NOTE — Patient Instructions (Signed)

## 2021-12-21 ENCOUNTER — Other Ambulatory Visit: Payer: Self-pay

## 2021-12-21 ENCOUNTER — Inpatient Hospital Stay: Payer: Medicare Other

## 2021-12-21 ENCOUNTER — Other Ambulatory Visit: Payer: Medicare Other

## 2021-12-21 VITALS — BP 101/57 | HR 64 | Temp 97.6°F | Resp 18 | Wt 118.2 lb

## 2021-12-21 DIAGNOSIS — C250 Malignant neoplasm of head of pancreas: Secondary | ICD-10-CM

## 2021-12-21 DIAGNOSIS — Z95828 Presence of other vascular implants and grafts: Secondary | ICD-10-CM

## 2021-12-21 DIAGNOSIS — D696 Thrombocytopenia, unspecified: Secondary | ICD-10-CM

## 2021-12-21 DIAGNOSIS — Z5111 Encounter for antineoplastic chemotherapy: Secondary | ICD-10-CM | POA: Diagnosis not present

## 2021-12-21 LAB — CBC WITH DIFFERENTIAL (CANCER CENTER ONLY)
Abs Immature Granulocytes: 0 10*3/uL (ref 0.00–0.07)
Basophils Absolute: 0 10*3/uL (ref 0.0–0.1)
Basophils Relative: 0 %
Eosinophils Absolute: 0.3 10*3/uL (ref 0.0–0.5)
Eosinophils Relative: 3 %
HCT: 24 % — ABNORMAL LOW (ref 39.0–52.0)
Hemoglobin: 7.7 g/dL — ABNORMAL LOW (ref 13.0–17.0)
Lymphocytes Relative: 1 %
Lymphs Abs: 0.1 10*3/uL — ABNORMAL LOW (ref 0.7–4.0)
MCH: 31.2 pg (ref 26.0–34.0)
MCHC: 32.1 g/dL (ref 30.0–36.0)
MCV: 97.2 fL (ref 80.0–100.0)
Monocytes Absolute: 0 10*3/uL — ABNORMAL LOW (ref 0.1–1.0)
Monocytes Relative: 0 %
Neutro Abs: 10.8 10*3/uL — ABNORMAL HIGH (ref 1.7–7.7)
Neutrophils Relative %: 96 %
Platelet Count: 147 10*3/uL — ABNORMAL LOW (ref 150–400)
RBC: 2.47 MIL/uL — ABNORMAL LOW (ref 4.22–5.81)
RDW: 19.4 % — ABNORMAL HIGH (ref 11.5–15.5)
WBC Count: 11.2 10*3/uL — ABNORMAL HIGH (ref 4.0–10.5)
nRBC: 0 % (ref 0.0–0.2)

## 2021-12-21 LAB — SAMPLE TO BLOOD BANK

## 2021-12-21 MED ORDER — SODIUM CHLORIDE 0.9% FLUSH
10.0000 mL | INTRAVENOUS | Status: DC | PRN
  Administered 2021-12-21: 10 mL

## 2021-12-21 MED ORDER — ROMIPLOSTIM 125 MCG ~~LOC~~ SOLR
2.0000 ug/kg | Freq: Once | SUBCUTANEOUS | Status: AC
  Administered 2021-12-21: 105 ug via SUBCUTANEOUS
  Filled 2021-12-21: qty 0.21

## 2021-12-21 MED ORDER — HEPARIN SOD (PORK) LOCK FLUSH 100 UNIT/ML IV SOLN
500.0000 [IU] | Freq: Once | INTRAVENOUS | Status: AC | PRN
  Administered 2021-12-21: 500 [IU]

## 2021-12-21 NOTE — Patient Instructions (Signed)
Romiplostim injection ?What is this medication? ?ROMIPLOSTIM (roe mi PLOE stim) helps your body make more platelets. This medicine is used to treat low platelets caused by chronic idiopathic thrombocytopenic purpura (ITP) or a bone marrow syndrome caused by radiation sickness. ?This medicine may be used for other purposes; ask your health care provider or pharmacist if you have questions. ?COMMON BRAND NAME(S): Nplate ?What should I tell my care team before I take this medication? ?They need to know if you have any of these conditions: ?blood clots ?myelodysplastic syndrome ?an unusual or allergic reaction to romiplostim, mannitol, other medicines, foods, dyes, or preservatives ?pregnant or trying to get pregnant ?breast-feeding ?How should I use this medication? ?This medicine is injected under the skin. It is given by a health care provider in a hospital or clinic setting. ?A special MedGuide will be given to you before each treatment. Be sure to read this information carefully each time. ?Talk to your health care provider about the use of this medicine in children. While it may be prescribed for children as young as newborns for selected conditions, precautions do apply. ?Overdosage: If you think you have taken too much of this medicine contact a poison control center or emergency room at once. ?NOTE: This medicine is only for you. Do not share this medicine with others. ?What if I miss a dose? ?Keep appointments for follow-up doses. It is important not to miss your dose. Call your health care provider if you are unable to keep an appointment. ?What may interact with this medication? ?Interactions are not expected. ?This list may not describe all possible interactions. Give your health care provider a list of all the medicines, herbs, non-prescription drugs, or dietary supplements you use. Also tell them if you smoke, drink alcohol, or use illegal drugs. Some items may interact with your medicine. ?What should I  watch for while using this medication? ?Visit your health care provider for regular checks on your progress. You may need blood work done while you are taking this medicine. Your condition will be monitored carefully while you are receiving this medicine. It is important not to miss any appointments. ?What side effects may I notice from receiving this medication? ?Side effects that you should report to your doctor or health care professional as soon as possible: ?allergic reactions (skin rash, itching or hives; swelling of the face, lips, or tongue) ?bleeding (bloody or black, tarry stools; red or dark brown urine; spitting up blood or brown material that looks like coffee grounds; red spots on the skin; unusual bruising or bleeding from the eyes, gums, or nose) ?blood clot (chest pain; shortness of breath; pain, swelling, or warmth in the leg) ?stroke (changes in vision; confusion; trouble speaking or understanding; severe headaches; sudden numbness or weakness of the face, arm or leg; trouble walking; dizziness; loss of balance or coordination) ?Side effects that usually do not require medical attention (report to your doctor or health care professional if they continue or are bothersome): ?diarrhea ?dizziness ?headache ?joint pain ?muscle pain ?stomach pain ?trouble sleeping ?This list may not describe all possible side effects. Call your doctor for medical advice about side effects. You may report side effects to FDA at 1-800-FDA-1088. ?Where should I keep my medication? ?This medicine is given in a hospital or clinic. It will not be stored at home. ?NOTE: This sheet is a summary. It may not cover all possible information. If you have questions about this medicine, talk to your doctor, pharmacist, or health care provider. ??   2022 Elsevier/Gold Standard (2021-07-21 00:00:00) ? ?

## 2021-12-22 ENCOUNTER — Inpatient Hospital Stay: Payer: Medicare Other

## 2021-12-22 ENCOUNTER — Other Ambulatory Visit: Payer: Self-pay

## 2021-12-22 DIAGNOSIS — C259 Malignant neoplasm of pancreas, unspecified: Secondary | ICD-10-CM

## 2021-12-22 LAB — PREPARE RBC (CROSSMATCH)

## 2021-12-23 ENCOUNTER — Inpatient Hospital Stay: Payer: Medicare Other

## 2021-12-23 ENCOUNTER — Other Ambulatory Visit: Payer: Self-pay

## 2021-12-23 VITALS — BP 109/60 | HR 71 | Temp 97.6°F | Resp 20

## 2021-12-23 DIAGNOSIS — C259 Malignant neoplasm of pancreas, unspecified: Secondary | ICD-10-CM

## 2021-12-23 DIAGNOSIS — Z95828 Presence of other vascular implants and grafts: Secondary | ICD-10-CM

## 2021-12-23 DIAGNOSIS — Z5111 Encounter for antineoplastic chemotherapy: Secondary | ICD-10-CM | POA: Diagnosis not present

## 2021-12-23 MED ORDER — SODIUM CHLORIDE 0.9% FLUSH
10.0000 mL | Freq: Once | INTRAVENOUS | Status: AC
  Administered 2021-12-23: 10 mL via INTRAVENOUS

## 2021-12-23 MED ORDER — HEPARIN SOD (PORK) LOCK FLUSH 100 UNIT/ML IV SOLN
500.0000 [IU] | Freq: Every day | INTRAVENOUS | Status: AC | PRN
  Administered 2021-12-23: 500 [IU]

## 2021-12-23 MED ORDER — SODIUM CHLORIDE 0.9% IV SOLUTION
250.0000 mL | Freq: Once | INTRAVENOUS | Status: AC
  Administered 2021-12-23: 250 mL via INTRAVENOUS

## 2021-12-23 NOTE — Patient Instructions (Signed)
Blood Transfusion, Adult, Care After This sheet gives you information about how to care for yourself after your procedure. Your doctor may also give you more specific instructions. If you have problems or questions, contact your doctor. What can I expect after the procedure? After the procedure, it is common to have: Bruising and soreness at the IV site. A headache. Follow these instructions at home: Insertion site care   Follow instructions from your doctor about how to take care of your insertion site. This is where an IV tube was put into your vein. Make sure you: Wash your hands with soap and water before and after you change your bandage (dressing). If you cannot use soap and water, use hand sanitizer. Change your bandage as told by your doctor. Check your insertion site every day for signs of infection. Check for: Redness, swelling, or pain. Bleeding from the site. Warmth. Pus or a bad smell. General instructions Take over-the-counter and prescription medicines only as told by your doctor. Rest as told by your doctor. Go back to your normal activities as told by your doctor. Keep all follow-up visits as told by your doctor. This is important. Contact a doctor if: You have itching or red, swollen areas of skin (hives). You feel worried or nervous (anxious). You feel weak after doing your normal activities. You have redness, swelling, warmth, or pain around the insertion site. You have blood coming from the insertion site, and the blood does not stop with pressure. You have pus or a bad smell coming from the insertion site. Get help right away if: You have signs of a serious reaction. This may be coming from an allergy or the body's defense system (immune system). Signs include: Trouble breathing or shortness of breath. Swelling of the face or feeling warm (flushed). Fever or chills. Head, chest, or back pain. Dark pee (urine) or blood in the pee. Widespread rash. Fast  heartbeat. Feeling dizzy or light-headed. You may receive your blood transfusion in an outpatient setting. If so, you will be told whom to contact to report any reactions. These symptoms may be an emergency. Do not wait to see if the symptoms will go away. Get medical help right away. Call your local emergency services (911 in the [place] Do not drive yourself to the hospital. Summary Bruising and soreness at the IV site are common. Check your insertion site every day for signs of infection. Rest as told by your doctor. Go back to your normal activities as told by your doctor. Get help right away if you have signs of a serious reaction. This information is not intended to replace advice given to you by your health care provider. Make sure you discuss any questions you have with your health care provider. Document Revised: 02/26/2021 Document Reviewed: 04/26/2019 Elsevier Patient Education  2022 [employer]  

## 2021-12-24 LAB — TYPE AND SCREEN
ABO/RH(D): A POS
Antibody Screen: NEGATIVE
Unit division: 0
Unit division: 0

## 2021-12-24 LAB — BPAM RBC
Blood Product Expiration Date: 202303012359
Blood Product Expiration Date: 202303012359
ISSUE DATE / TIME: 202302080738
ISSUE DATE / TIME: 202302080738
Unit Type and Rh: 6200
Unit Type and Rh: 6200

## 2021-12-25 ENCOUNTER — Other Ambulatory Visit: Payer: Self-pay | Admitting: Oncology

## 2021-12-25 DIAGNOSIS — C250 Malignant neoplasm of head of pancreas: Secondary | ICD-10-CM

## 2021-12-27 ENCOUNTER — Other Ambulatory Visit: Payer: Self-pay | Admitting: Oncology

## 2021-12-28 ENCOUNTER — Encounter: Payer: Self-pay | Admitting: Nurse Practitioner

## 2021-12-28 ENCOUNTER — Encounter: Payer: Self-pay | Admitting: Oncology

## 2021-12-29 ENCOUNTER — Inpatient Hospital Stay: Payer: Medicare Other

## 2021-12-29 ENCOUNTER — Other Ambulatory Visit: Payer: Self-pay

## 2021-12-29 ENCOUNTER — Inpatient Hospital Stay (HOSPITAL_BASED_OUTPATIENT_CLINIC_OR_DEPARTMENT_OTHER): Payer: Medicare Other | Admitting: Oncology

## 2021-12-29 ENCOUNTER — Telehealth: Payer: Self-pay

## 2021-12-29 VITALS — BP 115/59 | HR 63 | Temp 97.8°F | Resp 18 | Ht 69.0 in | Wt 111.6 lb

## 2021-12-29 DIAGNOSIS — C250 Malignant neoplasm of head of pancreas: Secondary | ICD-10-CM

## 2021-12-29 DIAGNOSIS — C259 Malignant neoplasm of pancreas, unspecified: Secondary | ICD-10-CM

## 2021-12-29 DIAGNOSIS — D696 Thrombocytopenia, unspecified: Secondary | ICD-10-CM

## 2021-12-29 DIAGNOSIS — Z5111 Encounter for antineoplastic chemotherapy: Secondary | ICD-10-CM | POA: Diagnosis not present

## 2021-12-29 LAB — CBC WITH DIFFERENTIAL (CANCER CENTER ONLY)
Abs Immature Granulocytes: 0.13 10*3/uL — ABNORMAL HIGH (ref 0.00–0.07)
Basophils Absolute: 0.1 10*3/uL (ref 0.0–0.1)
Basophils Relative: 1 %
Eosinophils Absolute: 0.2 10*3/uL (ref 0.0–0.5)
Eosinophils Relative: 3 %
HCT: 30.3 % — ABNORMAL LOW (ref 39.0–52.0)
Hemoglobin: 9.9 g/dL — ABNORMAL LOW (ref 13.0–17.0)
Immature Granulocytes: 2 %
Lymphocytes Relative: 4 %
Lymphs Abs: 0.3 10*3/uL — ABNORMAL LOW (ref 0.7–4.0)
MCH: 30.7 pg (ref 26.0–34.0)
MCHC: 32.7 g/dL (ref 30.0–36.0)
MCV: 94.1 fL (ref 80.0–100.0)
Monocytes Absolute: 0.4 10*3/uL (ref 0.1–1.0)
Monocytes Relative: 5 %
Neutro Abs: 6.7 10*3/uL (ref 1.7–7.7)
Neutrophils Relative %: 85 %
Platelet Count: 128 10*3/uL — ABNORMAL LOW (ref 150–400)
RBC: 3.22 MIL/uL — ABNORMAL LOW (ref 4.22–5.81)
RDW: 17.9 % — ABNORMAL HIGH (ref 11.5–15.5)
WBC Count: 7.8 10*3/uL (ref 4.0–10.5)
nRBC: 0 % (ref 0.0–0.2)

## 2021-12-29 LAB — MAGNESIUM: Magnesium: 1.5 mg/dL — ABNORMAL LOW (ref 1.7–2.4)

## 2021-12-29 LAB — CMP (CANCER CENTER ONLY)
ALT: 36 U/L (ref 0–44)
AST: 23 U/L (ref 15–41)
Albumin: 3.8 g/dL (ref 3.5–5.0)
Alkaline Phosphatase: 310 U/L — ABNORMAL HIGH (ref 38–126)
Anion gap: 11 (ref 5–15)
BUN: 37 mg/dL — ABNORMAL HIGH (ref 8–23)
CO2: 10 mmol/L — ABNORMAL LOW (ref 22–32)
Calcium: 8.4 mg/dL — ABNORMAL LOW (ref 8.9–10.3)
Chloride: 118 mmol/L — ABNORMAL HIGH (ref 98–111)
Creatinine: 2.49 mg/dL — ABNORMAL HIGH (ref 0.61–1.24)
GFR, Estimated: 27 mL/min — ABNORMAL LOW (ref >60)
Glucose, Bld: 146 mg/dL — ABNORMAL HIGH (ref 70–99)
Potassium: 3.3 mmol/L — ABNORMAL LOW (ref 3.5–5.1)
Sodium: 139 mmol/L (ref 135–145)
Total Bilirubin: 0.9 mg/dL (ref 0.3–1.2)
Total Protein: 6.5 g/dL (ref 6.5–8.1)

## 2021-12-29 LAB — SAMPLE TO BLOOD BANK

## 2021-12-29 MED ORDER — POTASSIUM CHLORIDE IN NACL 20-0.9 MEQ/L-% IV SOLN
Freq: Once | INTRAVENOUS | Status: AC
  Filled 2021-12-29: qty 1000

## 2021-12-29 MED ORDER — SODIUM CHLORIDE 0.9 % IV SOLN: INTRAVENOUS | Status: DC

## 2021-12-29 MED ORDER — SODIUM CHLORIDE 0.9% FLUSH
10.0000 mL | Freq: Once | INTRAVENOUS | Status: AC
  Administered 2021-12-29: 10 mL via INTRAVENOUS

## 2021-12-29 MED ORDER — HEPARIN SOD (PORK) LOCK FLUSH 100 UNIT/ML IV SOLN
500.0000 [IU] | Freq: Once | INTRAVENOUS | Status: AC
  Administered 2021-12-29: 500 [IU] via INTRAVENOUS

## 2021-12-29 NOTE — Patient Instructions (Signed)
Potassium Chloride Injection °What is this medication? °POTASSIUM CHLORIDE (poe TASS i um KLOOR ide) prevents and treats low levels of potassium in your body. Potassium plays an important role in maintaining the health of your kidneys, heart, muscles, and nervous system. °This medicine may be used for other purposes; ask your health care provider or pharmacist if you have questions. °COMMON BRAND NAME(S): PROAMP °What should I tell my care team before I take this medication? °They need to know if you have any of these conditions: °Addison disease °Dehydration °Diabetes (high blood sugar) °Heart disease °High levels of potassium in the blood °Irregular heartbeat or rhythm °Kidney disease °Large areas of burned skin °An unusual or allergic reaction to potassium, other medications, foods, dyes, or preservatives °Pregnant or trying to get pregnant °Breast-feeding °How should I use this medication? °This medication is injected into a vein. It is given in a hospital or clinic setting. °Talk to your care team about the use of this medication in children. Special care may be needed. °Overdosage: If you think you have taken too much of this medicine contact a poison control center or emergency room at once. °NOTE: This medicine is only for you. Do not share this medicine with others. °What if I miss a dose? °This does not apply. This medication is not for regular use. °What may interact with this medication? °Do not take this medication with any of the following: °Certain diuretics such as spironolactone, triamterene °Eplerenone °Sodium polystyrene sulfonate °This medication may also interact with the following: °Certain medications for blood pressure or heart disease like lisinopril, losartan, quinapril, valsartan °Medications that lower your chance of fighting infection such as cyclosporine, tacrolimus °NSAIDs, medications for pain and inflammation, like ibuprofen or naproxen °Other potassium supplements °Salt  substitutes °This list may not describe all possible interactions. Give your health care provider a list of all the medicines, herbs, non-prescription drugs, or dietary supplements you use. Also tell them if you smoke, drink alcohol, or use illegal drugs. Some items may interact with your medicine. °What should I watch for while using this medication? °Visit your care team for regular checks on your progress. Tell your care team if your symptoms do not start to get better or if they get worse. °You may need blood work while you are taking this medication. °Avoid salt substitutes unless you are told otherwise by your care team. °What side effects may I notice from receiving this medication? °Side effects that you should report to your care team as soon as possible: °Allergic reactions--skin rash, itching, hives, swelling of the face, lips, tongue, or throat °High potassium level--muscle weakness, fast or irregular heartbeat °Side effects that usually do not require medical attention (report to your care team if they continue or are bothersome): °Diarrhea °Nausea °Stomach pain °Vomiting °This list may not describe all possible side effects. Call your doctor for medical advice about side effects. You may report side effects to FDA at 1-800-FDA-1088. °Where should I keep my medication? °This medication is given in a hospital or clinic. It will not be stored at home. °NOTE: This sheet is a summary. It may not cover all possible information. If you have questions about this medicine, talk to your doctor, pharmacist, or health care provider. °© 2022 Elsevier/Gold Standard (2021-02-17 00:00:00) ° °

## 2021-12-29 NOTE — Progress Notes (Signed)
Patient seen by Dr. Benay Spice today  Vitals are within treatment parameters.  Labs reviewed by Dr. Benay Spice and are not all within treatment parameters. BUN 37, Creatinine 2.49, GFR 27, Potassium 3.3.  Per physician team, patient will not be receiving treatment today. Per MD Sherrill, no nplate today. Per MD Benay Spice, pt will receive IVF with potassium today instead.

## 2021-12-29 NOTE — Progress Notes (Signed)
[facility] OFFICE PROGRESS NOTE   Diagnosis: Pancreas cancer  INTERVAL HISTORY:   Aaron Johnston returns as scheduled.  He saw Dr. Berneice Gandy on 12/22/2021.  Dr. Berneice Gandy recommends continuing the current systemic regimen unless there is clear evidence of disease progression.  He recommends Aaron Johnston increase his calorie intake.  [name] reports nausea/vomiting and diarrhea for 2 days beginning 12/25/2021.  He is now able to eat and drink.  No pain.  Objective:  Vital signs in last 24 hours:  Blood pressure (!) 115/59, pulse 63, temperature 97.8 F (36.6 C), temperature source Oral, resp. rate 18, height '5\' 9"'  (1.753 m), weight 111 lb 9.6 oz (50.6 kg), SpO2 100 %.    HEENT: No thrush or ulcers Resp: Lungs clear bilaterally Cardio: Regular rate and rhythm GI: No apparent ascites, no mass, nontender, reducible right inguinal hernia Vascular: No leg edema, diminished skin turgor  Portacath/PICC-without erythema  Lab Results:  Lab Results  Component Value Date   WBC 7.8 12/29/2021   HGB 9.9 (L) 12/29/2021   HCT 30.3 (L) 12/29/2021   MCV 94.1 12/29/2021   PLT 128 (L) 12/29/2021   NEUTROABS 6.7 12/29/2021    CMP  Lab Results  Component Value Date   NA 139 12/29/2021   K 3.3 (L) 12/29/2021   CL 118 (H) 12/29/2021   CO2 10 (L) 12/29/2021   GLUCOSE 146 (H) 12/29/2021   BUN 37 (H) 12/29/2021   CREATININE 2.49 (H) 12/29/2021   CALCIUM 8.4 (L) 12/29/2021   PROT 6.5 12/29/2021   ALBUMIN 3.8 12/29/2021   AST 23 12/29/2021   ALT 36 12/29/2021   ALKPHOS 310 (H) 12/29/2021   BILITOT 0.9 12/29/2021   GFRNONAA 27 (L) 12/29/2021   GFRAA NOT CALCULATED 02/23/2021    Lab Results  Component Value Date   ONG295 3,813 (H) 12/14/2021    Medications: I have reviewed the patient's current medications.   Assessment/Plan: Pancreas cancer CT urology center 03/04/2020-haziness of the fat adjacent to the celiac axis and SMA with focal narrowing of the SMV, splenic vein, and  splenoportal confluence CT 07/07/2020-infiltrative retroperitoneal mass with encasement of the celiac axis and SMA, high-grade narrowing of the proximal portal vein with cavernous transformation, nonocclusive thrombus within the main portal vein, mild biliary ductal dilatation, diffuse hypodense/hypoenhancing areas within the liver (edema versus infiltrating neoplasm), 2 x 1.7 cm area of focal prominence in the pancreas EUS 07/10/2020-no pancreas mass identified, 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, 1.5 cm aortocaval node biopsy-scant lymphoid material, no malignancy Diagnostic laparoscopy 07/17/2020-segment 3 and 4 liver nodules, adenocarcinoma, positive for pankeratin, CK7 and CK20, partially positive for TTF-1, negative for CDX2 Foundation 1-microsatellite stable, tumor mutation burden 4, K-ras G12D, subclonal RB1 alteration Elevated CA 19-9 Cycle 1 FOLFOX 07/28/2020 Cycle 2 FOLFOX 08/18/2020, oxaliplatin dose reduced due to thrombocytopenia Cycle 3 FOLFOX 09/08/2020 Cycle 4 FOLFOX 09/29/2020  Cycle 5 FOLFOX 10/20/2020 CTs 11/06/2020-grossly stable infiltrative mass within the pancreatic head and porta hepatis.  New small low-density liver lesions.  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 Cycle 6 FOLFOX 11/10/2021  Cycle 7 FOLFOX 12/01/2020 MRI liver 12/04/2020-no significant change in posttreatment appearance of the pancreatic head.  Numerous intrinsically low signal hypoenhancing lesions of the liver parenchyma predominantly observed in the right lobe of the liver and liver dome, measuring no greater than 8 mm and as seen on recent prior  CT of the abdomen/pelvis. Cycle 8 FOLFOX 12/22/2020 Cycle 9 FOLFOX 01/12/2021 Cycle 10 FOLFOX 02/02/2021 MRI liver 02/19/2021-no change in pancreas head mass, multiple hypoenhancing liver lesions are new and increased in size,  effacement of portal vein by pancreas head mass with cavernous transformation, no change in mild splenomegaly Cycle 1 gemcitabine/Abraxane 03/12/2021 Cycle 2 gemcitabine/Abraxane 03/27/2021- dose reductions secondary to neutropenia Cycle 3 gemcitabine/Abraxane 04/17/2021-dose reductions due to thrombocytopenia, white cell growth factor support added Cycle 4 gemcitabine/Abraxane 05/01/2021, Ziextenzo Cycle 5 gemcitabine/Abraxane 05/15/2021, Ziextenzo MRI abdomen 05/26/2021-mild enlargement of dominant hepatic metastases, appearance of infiltrative pancreas mass extending to the celiac trunk and SMA, splenic vein occlusion, cavernous transformation of the portal vein with upper abdomen collaterals, splenomegaly, mild upper abdominal ascites and mesenteric edema, minimal subpleural nodularity left lower lobe Cycle 6 gemcitabine/Abraxane 05/28/2021 Cycle 7 gemcitabine/Abraxane 06/12/2021 Cycle 8 gemcitabine/Abraxane 06/26/2021 Cycle 9 gemcitabine/Abraxane 07/10/2021 Cycle 10 gemcitabine/Abraxane 07/23/2021 Cycle 11 gemcitabine/Abraxane 08/06/2021 MRI abdomen 08/20/2021-multifocal nodularity in the lower chest with a new discrete right lower lobe nodule, enlarging subsegment 7/8 liver lesion, other small lesions appear stable, gastric varices, review of images with radiology-enlargement of at least 2 liver lesions, suspicious nodule n the right lower lung Cycle 1  5-FU/liposomal irinotecan 09/08/2021 Cycle 2  5-FU/liposomal irinotecan 09/22/2021 Cycle 3  5-FU/liposomal irinotecan 10/20/2021 Cycle 4 5-FU/liposomal irinotecan 11/04/2021 Cycle 5 5-FU/liposomal irinotecan 11/17/2021 MRI abdomen 11/27/2021-generally unchanged appearance of the pancreas with a very ill-defined hypoenhancing mass centered within the pancreatic neck, again appears to encase the celiac axis and encasing occlude the superior mesenteric vein and splenic vein; interval enlargement of multiple liver lesions; numerous lung nodules in the bilateral lung  bases; unchanged splenomegaly; multiple new heterogeneously and hypoenhancing subcapsular lesions of the spleen most consistent with infarctions; small volume ascites throughout the abdomen; distended, debris-filled stomach suggesting some degree of gastric outlet and/or duodenal obstruction. 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   Admission with recurrent GI bleeding and severe anemia 09/15/2021 Upper endoscopy 09/17/2021-esophageal varices with stigmata of recent bleeding, banded.  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  His performance status appears to be declining.  He has lost weight over the past few weeks.  He appears dehydrated today.  I recommend holding chemotherapy today.  He will receive intravenous fluids and potassium supplementation.  We will check the magnesium level.  Mr. Wigington will return for an office visit in reassessment on 01/01/2022.  We will decide on continuing liposomal irinotecan versus comfort care based on his  clinical condition over the next few weeks.  Betsy Coder, MD  12/29/2021  11:24 AM

## 2021-12-29 NOTE — Telephone Encounter (Signed)
TC to Pt Pt's wife informed of low magnesium and informed Pt will also receive magnesium with fluids on Friday. Pt's wife verbalized understanding.

## 2021-12-29 NOTE — Telephone Encounter (Signed)
-----   Message from Ladell Pier, MD sent at 12/29/2021  2:35 PM EST ----- Please call patient, magnesium is low, and IV fluids with magnesium sulfate 2 g on 01/01/2022

## 2021-12-30 ENCOUNTER — Other Ambulatory Visit: Payer: Self-pay | Admitting: *Deleted

## 2021-12-30 DIAGNOSIS — C259 Malignant neoplasm of pancreas, unspecified: Secondary | ICD-10-CM

## 2021-12-30 LAB — CANCER ANTIGEN 19-9: CA 19-9: 3288 U/mL — ABNORMAL HIGH (ref 0–35)

## 2021-12-30 NOTE — Progress Notes (Signed)
Per Dr. Benay Spice: Only needs BMP and Mg+ on 2/17. Orders placed.

## 2021-12-31 ENCOUNTER — Inpatient Hospital Stay: Payer: Medicare Other

## 2022-01-01 ENCOUNTER — Inpatient Hospital Stay: Payer: Medicare Other

## 2022-01-01 ENCOUNTER — Other Ambulatory Visit: Payer: Medicare Other

## 2022-01-01 ENCOUNTER — Inpatient Hospital Stay (HOSPITAL_BASED_OUTPATIENT_CLINIC_OR_DEPARTMENT_OTHER): Payer: Medicare Other | Admitting: Nurse Practitioner

## 2022-01-01 ENCOUNTER — Other Ambulatory Visit: Payer: Self-pay

## 2022-01-01 ENCOUNTER — Encounter: Payer: Self-pay | Admitting: Nurse Practitioner

## 2022-01-01 VITALS — BP 104/64 | HR 71 | Temp 98.7°F | Resp 18 | Ht 69.0 in | Wt 117.2 lb

## 2022-01-01 DIAGNOSIS — C259 Malignant neoplasm of pancreas, unspecified: Secondary | ICD-10-CM

## 2022-01-01 DIAGNOSIS — C250 Malignant neoplasm of head of pancreas: Secondary | ICD-10-CM

## 2022-01-01 DIAGNOSIS — Z5111 Encounter for antineoplastic chemotherapy: Secondary | ICD-10-CM | POA: Diagnosis not present

## 2022-01-01 MED ORDER — SODIUM CHLORIDE 0.9 % IV SOLN: Freq: Once | INTRAVENOUS | Status: AC

## 2022-01-01 MED ORDER — HYDROCODONE-ACETAMINOPHEN 5-325 MG PO TABS: 1.0000 | ORAL_TABLET | ORAL | 0 refills | Status: DC | PRN

## 2022-01-01 MED ORDER — PROCHLORPERAZINE MALEATE 10 MG PO TABS: 10.0000 mg | ORAL_TABLET | Freq: Four times a day (QID) | ORAL | 1 refills | Status: AC | PRN

## 2022-01-01 MED ORDER — SODIUM CHLORIDE 0.9% FLUSH
10.0000 mL | Freq: Once | INTRAVENOUS | Status: AC
  Administered 2022-01-01: 10 mL via INTRAVENOUS

## 2022-01-01 MED ORDER — HEPARIN SOD (PORK) LOCK FLUSH 100 UNIT/ML IV SOLN
500.0000 [IU] | Freq: Once | INTRAVENOUS | Status: AC
  Administered 2022-01-01: 500 [IU] via INTRAVENOUS

## 2022-01-01 MED ORDER — SODIUM CHLORIDE 0.9 % IV SOLN: INTRAVENOUS | Status: DC

## 2022-01-01 MED ORDER — MAGNESIUM SULFATE 2 GM/50ML IV SOLN
2.0000 g | Freq: Once | INTRAVENOUS | Status: AC
  Administered 2022-01-01: 2 g via INTRAVENOUS
  Filled 2022-01-01: qty 50

## 2022-01-01 NOTE — Patient Instructions (Signed)
Rehydration, Adult °Rehydration is the replacement of body fluids, salts, and minerals (electrolytes) that are lost during dehydration. Dehydration is when there is not enough water or other fluids in the body. This happens when you lose more fluids than you take in. Common causes of dehydration include: °Not drinking enough fluids. This can occur when you are ill or doing activities that require a lot of energy, especially in hot weather. °Conditions that cause loss of water or other fluids, such as diarrhea, vomiting, sweating, or urinating a lot. °Other illnesses, such as fever or infection. °Certain medicines, such as those that remove excess fluid from the body (diuretics). °Symptoms of mild or moderate dehydration may include thirst, dry lips and mouth, and dizziness. Symptoms of severe dehydration may include increased heart rate, confusion, fainting, and not urinating. °For severe dehydration, you may need to get fluids through an IV at the hospital. For mild or moderate dehydration, you can usually rehydrate at home by drinking certain fluids as told by your health care provider. °What are the risks? °Generally, rehydration is safe. However, taking in too much fluid (overhydration) can be a problem. This is rare. Overhydration can cause an electrolyte imbalance, kidney failure, or a decrease in salt (sodium) levels in the body. °Supplies needed °You will need an oral rehydration solution (ORS) if your health care provider tells you to use one. This is a drink to treat dehydration. It can be found in pharmacies and retail stores. °How to rehydrate °Fluids °Follow instructions from your health care provider for rehydration. The kind of fluid and the amount you should drink depend on your condition. In general, you should choose drinks that you prefer. °If told by your health care provider, drink an ORS. °Make an ORS by following instructions on the package. °Start by drinking small amounts, about ½ cup (120  mL) every 5-10 minutes. °Slowly increase how much you drink until you have taken the amount recommended by your health care provider. °Drink enough clear fluids to keep your urine pale yellow. If you were told to drink an ORS, finish it first, then start slowly drinking other clear fluids. Drink fluids such as: °Water. This includes sparkling water and flavored water. Drinking only water can lead to having too little sodium in your body (hyponatremia). Follow the advice of your health care provider. °Water from ice chips you suck on. °Fruit juice with water you add to it (diluted). °Sports drinks. °Hot or cold herbal teas. °Broth-based soups. °Milk or milk products. °Food °Follow instructions from your health care provider about what to eat while you rehydrate. Your health care provider may recommend that you slowly begin eating regular foods in small amounts. °Eat foods that contain a healthy balance of electrolytes, such as bananas, oranges, potatoes, tomatoes, and spinach. °Avoid foods that are greasy or contain a lot of sugar. °In some cases, you may get nutrition through a feeding tube that is passed through your nose and into your stomach (nasogastric tube, or NG tube). This may be done if you have uncontrolled vomiting or diarrhea. °Beverages to avoid °Certain beverages may make dehydration worse. While you rehydrate, avoid drinking alcohol. °How to tell if you are recovering from dehydration °You may be recovering from dehydration if: °You are urinating more often than before you started rehydrating. °Your urine is pale yellow. °Your energy level improves. °You vomit less frequently. °You have diarrhea less frequently. °Your appetite improves or returns to normal. °You feel less dizzy or less light-headed. °Your   skin tone and color start to look more normal. Follow these instructions at home: Take over-the-counter and prescription medicines only as told by your health care provider. Do not take sodium  tablets. Doing this can lead to having too much sodium in your body (hypernatremia). Contact a health care provider if: You continue to have symptoms of mild or moderate dehydration, such as: Thirst. Dry lips. Slightly dry mouth. Dizziness. Dark urine or less urine than normal. Muscle cramps. You continue to vomit or have diarrhea. Get help right away if you: Have symptoms of dehydration that get worse. Have a fever. Have a severe headache. Have been vomiting and the following happens: Your vomiting gets worse or does not go away. Your vomit includes blood or green matter (bile). You cannot eat or drink without vomiting. Have problems with urination or bowel movements, such as: Diarrhea that gets worse or does not go away. Blood in your stool (feces). This may cause stool to look black and tarry. Not urinating, or urinating only a small amount of very dark urine, within 6-8 hours. Have trouble breathing. Have symptoms that get worse with treatment. These symptoms may represent a serious problem that is an emergency. Do not wait to see if the symptoms will go away. Get medical help right away. Call your local emergency services (911 in the [place] Do not drive yourself to the hospital. Summary Rehydration is the replacement of body fluids and minerals (electrolytes) that are lost during dehydration. Follow instructions from your health care provider for rehydration. The kind of fluid and amount you should drink depend on your condition. Slowly increase how much you drink until you have taken the amount recommended by your health care provider. Contact your health care provider if you continue to show signs of mild or moderate dehydration. This information is not intended to replace advice given to you by your health care provider. Make sure you discuss any questions you have with your health care provider. Document Revised: 01/02/2020 Document Reviewed: 11/12/2019 Elsevier Patient  Education  2022 [place].  Hypomagnesemia Hypomagnesemia is a condition in which the level of magnesium in the blood is too low. Magnesium is a mineral that is found in many foods. It is used in many different processes in the body. Hypomagnesemia can affect every organ in the body. In severe cases, it can cause life-threatening problems. What are the causes? This condition may be caused by: Not getting enough magnesium in your diet or not having enough healthy foods to eat (malnutrition). Problems with magnesium absorption in the intestines. Dehydration. Excessive use of alcohol. Vomiting. Severe or long-term (chronic) diarrhea. Some medicines, including medicines that make you urinate more often (diuretics). Certain diseases, such as kidney disease, diabetes, celiac disease, and overactive thyroid. What are the signs or symptoms? Symptoms of this condition include: Loss of appetite, nausea, and vomiting. Involuntary shaking or trembling of a body part (tremor). Muscle weakness or tingling in the arms and legs. Sudden tightening of muscles (muscle spasms). Confusion. Psychiatric issues, such as: Depression and irritability. Psychosis. A feeling of fluttering of the heart (palpitations). Seizures. These symptoms are more severe if magnesium levels drop suddenly. How is this diagnosed? This condition may be diagnosed based on: Your symptoms and medical history. A physical exam. Blood and urine tests. How is this treated? Treatment depends on the cause and the severity of the condition. It may be treated by: Taking a magnesium supplement. This can be taken in pill form. If the condition  is severe, magnesium is usually given through an IV. Making changes to your diet. You may be directed to eat foods that have a lot of magnesium, such as green leafy vegetables, peas, beans, and nuts. Not drinking alcohol. If you are struggling not to drink, ask your health care provider for  help. Follow these instructions at home: Eating and drinking   Make sure that your diet includes foods with magnesium. Foods that have a lot of magnesium in them include: Green leafy vegetables, such as spinach and broccoli. Beans and peas. Nuts and seeds, such as almonds and sunflower seeds. Whole grains, such as whole grain bread and fortified cereals. Drink fluids that contain salts and minerals (electrolytes), such as sports drinks, when you are active. Do not drink alcohol. General instructions Take over-the-counter and prescription medicines only as told by your health care provider. Take magnesium supplements as directed if your health care provider tells you to take them. Have your magnesium levels monitored as told by your health care provider. Keep all follow-up visits. This is important. Contact a health care provider if: You get worse instead of better. Your symptoms return. Get help right away if: You develop severe muscle weakness. You have trouble breathing. You feel that your heart is racing. These symptoms may represent a serious problem that is an emergency. Do not wait to see if the symptoms will go away. Get medical help right away. Call your local emergency services (911 in the [place] Do not drive yourself to the hospital. Summary Hypomagnesemia is a condition in which the level of magnesium in the blood is too low. Hypomagnesemia can affect every organ in the body. Treatment may include eating more foods that contain magnesium, taking magnesium supplements, and not drinking alcohol. Have your magnesium levels monitored as told by your health care provider. This information is not intended to replace advice given to you by your health care provider. Make sure you discuss any questions you have with your health care provider. Document Revised: 03/31/2021 Document Reviewed: 03/31/2021 Elsevier Patient Education  [place].

## 2022-01-01 NOTE — Progress Notes (Signed)
[facility] OFFICE PROGRESS NOTE   Diagnosis: Pancreas cancer  INTERVAL HISTORY:   [name] returns as scheduled.  Chemotherapy was held earlier in the week due to nausea/vomiting, overall decline in performance status.  He received IV fluids.  Overall he is feeling better.  No longer vomiting.  Periodic nausea.  Oral intake is better.  No change in baseline bowel habits.  He denies pain.  Objective:  Vital signs in last 24 hours:  Blood pressure 104/64, pulse 71, temperature 98.7 F (37.1 C), temperature source Oral, resp. rate 18, height '5\' 9"'  (1.753 m), weight 117 lb 3.2 oz (53.2 kg), SpO2 100 %.    Resp: Lungs clear bilaterally. Cardio: Regular rate and rhythm. GI: Abdomen appears mildly distended.  No apparent ascites. Vascular: No leg edema. Port-A-Cath without erythema.  Lab Results:  Lab Results  Component Value Date   WBC 7.8 12/29/2021   HGB 9.9 (L) 12/29/2021   HCT 30.3 (L) 12/29/2021   MCV 94.1 12/29/2021   PLT 128 (L) 12/29/2021   NEUTROABS 6.7 12/29/2021    Imaging:  No results found.  Medications: I have reviewed the patient's current medications.  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, mild biliary ductal dilatation, diffuse hypodense/hypoenhancing areas within the liver (edema versus infiltrating neoplasm), 2 x 1.7 cm area of focal prominence in the pancreas EUS 07/10/2020-no pancreas mass identified, 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, 1.5 cm aortocaval node biopsy-scant lymphoid material, no malignancy Diagnostic laparoscopy 07/17/2020-segment 3 and 4  liver nodules, adenocarcinoma, positive for pankeratin, CK7 and CK20, partially positive for TTF-1, negative for CDX2 Foundation 1-microsatellite stable, tumor mutation burden 4, K-ras G12D, subclonal RB1 alteration Elevated CA 19-9 Cycle 1 FOLFOX 07/28/2020 Cycle 2 FOLFOX 08/18/2020, oxaliplatin dose reduced due to thrombocytopenia Cycle 3 FOLFOX 09/08/2020 Cycle 4 FOLFOX 09/29/2020  Cycle 5 FOLFOX 10/20/2020 CTs 11/06/2020-grossly stable infiltrative mass within the pancreatic head and porta hepatis.  New small low-density liver lesions.  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 Cycle 6 FOLFOX 11/10/2021  Cycle 7 FOLFOX 12/01/2020 MRI liver 12/04/2020-no significant change in posttreatment appearance of the pancreatic head.  Numerous intrinsically low signal hypoenhancing lesions of the liver parenchyma predominantly observed in the right lobe of the liver and liver dome, measuring no greater than 8 mm and as seen on recent prior CT of the abdomen/pelvis. Cycle 8 FOLFOX 12/22/2020 Cycle 9 FOLFOX 01/12/2021 Cycle 10 FOLFOX 02/02/2021 MRI liver 02/19/2021-no change in pancreas head mass, multiple hypoenhancing liver lesions are new and increased in size, effacement of portal vein by pancreas head mass with cavernous transformation, no change in mild splenomegaly Cycle 1 gemcitabine/Abraxane 03/12/2021 Cycle 2 gemcitabine/Abraxane 03/27/2021- dose reductions secondary to neutropenia Cycle 3 gemcitabine/Abraxane 04/17/2021-dose reductions due to thrombocytopenia, white cell growth factor support added Cycle 4 gemcitabine/Abraxane 05/01/2021, Ziextenzo Cycle 5 gemcitabine/Abraxane 05/15/2021, Ziextenzo MRI abdomen 05/26/2021-mild enlargement of dominant hepatic metastases, appearance of infiltrative pancreas mass extending to the celiac trunk and SMA, splenic vein occlusion, cavernous  transformation of the portal vein with upper abdomen collaterals, splenomegaly, mild upper abdominal ascites and mesenteric edema,  minimal subpleural nodularity left lower lobe Cycle 6 gemcitabine/Abraxane 05/28/2021 Cycle 7 gemcitabine/Abraxane 06/12/2021 Cycle 8 gemcitabine/Abraxane 06/26/2021 Cycle 9 gemcitabine/Abraxane 07/10/2021 Cycle 10 gemcitabine/Abraxane 07/23/2021 Cycle 11 gemcitabine/Abraxane 08/06/2021 MRI abdomen 08/20/2021-multifocal nodularity in the lower chest with a new discrete right lower lobe nodule, enlarging subsegment 7/8 liver lesion, other small lesions appear stable, gastric varices, review of images with radiology-enlargement of at least 2 liver lesions, suspicious nodule n the right lower lung Cycle 1  5-FU/liposomal irinotecan 09/08/2021 Cycle 2  5-FU/liposomal irinotecan 09/22/2021 Cycle 3  5-FU/liposomal irinotecan 10/20/2021 Cycle 4 5-FU/liposomal irinotecan 11/04/2021 Cycle 5 5-FU/liposomal irinotecan 11/17/2021 MRI abdomen 11/27/2021-generally unchanged appearance of the pancreas with a very ill-defined hypoenhancing mass centered within the pancreatic neck, again appears to encase the celiac axis and encasing occlude the superior mesenteric vein and splenic vein; interval enlargement of multiple liver lesions; numerous lung nodules in the bilateral lung bases; unchanged splenomegaly; multiple new heterogeneously and hypoenhancing subcapsular lesions of the spleen most consistent with infarctions; small volume ascites throughout the abdomen; distended, debris-filled stomach suggesting some degree of gastric outlet and/or duodenal obstruction. 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   Admission with recurrent GI bleeding and severe anemia 09/15/2021 Upper endoscopy 09/17/2021-esophageal varices with stigmata of recent bleeding, banded.  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  Influenza A 10/13/2021      Disposition: Mr. Feasel performance status is somewhat improved.  He is no longer vomiting and has gained some weight.  He will return for lab and IV fluids 01/05/2022.  He will return for lab, follow-up, possible chemotherapy on 01/12/2022.  Patient seen with Dr. Benay Spice.   [name] ANP/GNP-BC   01/01/2022  2:17 PM  This was a shared visit with [name].  Mr. Gotay has improved performance status compared to when I saw him earlier this week.  He received intravenous fluids today.  He will return for a follow-up lab visit and IV fluids next week.  The plan is to resume chemotherapy 01/12/2022 if his performance status remains improved.  I was present for greater than 50% of today's visit.  I performed medical stage making.  [name], MD

## 2022-01-04 ENCOUNTER — Telehealth: Payer: Self-pay

## 2022-01-04 ENCOUNTER — Other Ambulatory Visit: Payer: Self-pay | Admitting: Nurse Practitioner

## 2022-01-04 ENCOUNTER — Other Ambulatory Visit (HOSPITAL_BASED_OUTPATIENT_CLINIC_OR_DEPARTMENT_OTHER): Payer: Self-pay

## 2022-01-04 DIAGNOSIS — C250 Malignant neoplasm of head of pancreas: Secondary | ICD-10-CM

## 2022-01-04 MED ORDER — HYDROCODONE-ACETAMINOPHEN 5-325 MG PO TABS
1.0000 | ORAL_TABLET | ORAL | 0 refills | Status: AC | PRN
  Filled 2022-01-04: qty 60, 5d supply, fill #0

## 2022-01-04 NOTE — Telephone Encounter (Signed)
Called [facility] and spoke with Maudie Mercury to cancel out patient Hydrocodone-acetaminophen. The provider will send the new prescription to [facility].

## 2022-01-05 ENCOUNTER — Inpatient Hospital Stay: Payer: Medicare Other

## 2022-01-05 ENCOUNTER — Other Ambulatory Visit: Payer: Self-pay | Admitting: *Deleted

## 2022-01-05 ENCOUNTER — Other Ambulatory Visit: Payer: Self-pay

## 2022-01-05 VITALS — BP 110/67 | HR 66 | Temp 98.7°F | Resp 18

## 2022-01-05 DIAGNOSIS — C250 Malignant neoplasm of head of pancreas: Secondary | ICD-10-CM

## 2022-01-05 DIAGNOSIS — Z95828 Presence of other vascular implants and grafts: Secondary | ICD-10-CM

## 2022-01-05 DIAGNOSIS — C259 Malignant neoplasm of pancreas, unspecified: Secondary | ICD-10-CM

## 2022-01-05 DIAGNOSIS — Z5111 Encounter for antineoplastic chemotherapy: Secondary | ICD-10-CM | POA: Diagnosis not present

## 2022-01-05 DIAGNOSIS — D696 Thrombocytopenia, unspecified: Secondary | ICD-10-CM

## 2022-01-05 LAB — CBC WITH DIFFERENTIAL (CANCER CENTER ONLY)
Abs Immature Granulocytes: 0.09 10*3/uL — ABNORMAL HIGH (ref 0.00–0.07)
Basophils Absolute: 0.1 10*3/uL (ref 0.0–0.1)
Basophils Relative: 1 %
Eosinophils Absolute: 0.3 10*3/uL (ref 0.0–0.5)
Eosinophils Relative: 2 %
HCT: 27.9 % — ABNORMAL LOW (ref 39.0–52.0)
Hemoglobin: 8.9 g/dL — ABNORMAL LOW (ref 13.0–17.0)
Immature Granulocytes: 1 %
Lymphocytes Relative: 5 %
Lymphs Abs: 0.7 10*3/uL (ref 0.7–4.0)
MCH: 30.4 pg (ref 26.0–34.0)
MCHC: 31.9 g/dL (ref 30.0–36.0)
MCV: 95.2 fL (ref 80.0–100.0)
Monocytes Absolute: 0.8 10*3/uL (ref 0.1–1.0)
Monocytes Relative: 6 %
Neutro Abs: 11.5 10*3/uL — ABNORMAL HIGH (ref 1.7–7.7)
Neutrophils Relative %: 85 %
Platelet Count: 179 10*3/uL (ref 150–400)
RBC: 2.93 MIL/uL — ABNORMAL LOW (ref 4.22–5.81)
RDW: 18.3 % — ABNORMAL HIGH (ref 11.5–15.5)
WBC Count: 13.5 10*3/uL — ABNORMAL HIGH (ref 4.0–10.5)
nRBC: 0 % (ref 0.0–0.2)

## 2022-01-05 LAB — BASIC METABOLIC PANEL - CANCER CENTER ONLY
Anion gap: 10 (ref 5–15)
BUN: 37 mg/dL — ABNORMAL HIGH (ref 8–23)
CO2: 10 mmol/L — ABNORMAL LOW (ref 22–32)
Calcium: 8.5 mg/dL — ABNORMAL LOW (ref 8.9–10.3)
Chloride: 115 mmol/L — ABNORMAL HIGH (ref 98–111)
Creatinine: 2.96 mg/dL — ABNORMAL HIGH (ref 0.61–1.24)
GFR, Estimated: 22 mL/min — ABNORMAL LOW (ref >60)
Glucose, Bld: 203 mg/dL — ABNORMAL HIGH (ref 70–99)
Potassium: 4.4 mmol/L (ref 3.5–5.1)
Sodium: 135 mmol/L (ref 135–145)

## 2022-01-05 LAB — SAMPLE TO BLOOD BANK

## 2022-01-05 LAB — MAGNESIUM: Magnesium: 2 mg/dL (ref 1.7–2.4)

## 2022-01-05 MED ORDER — ALTEPLASE 2 MG IJ SOLR: 2.0000 mg | Freq: Once | INTRAMUSCULAR | Status: DC | PRN

## 2022-01-05 MED ORDER — SODIUM CHLORIDE 0.9 % IV SOLN: INTRAVENOUS | Status: AC

## 2022-01-05 MED ORDER — HEPARIN SOD (PORK) LOCK FLUSH 100 UNIT/ML IV SOLN
500.0000 [IU] | Freq: Once | INTRAVENOUS | Status: AC | PRN
  Administered 2022-01-05: 500 [IU]

## 2022-01-05 MED ORDER — SODIUM CHLORIDE 0.9% FLUSH
10.0000 mL | INTRAVENOUS | Status: DC | PRN
  Administered 2022-01-05: 10 mL

## 2022-01-05 MED ORDER — ROMIPLOSTIM 125 MCG ~~LOC~~ SOLR
2.0000 ug/kg | Freq: Once | SUBCUTANEOUS | Status: AC
  Administered 2022-01-05: 105 ug via SUBCUTANEOUS
  Filled 2022-01-05: qty 0.21

## 2022-01-05 NOTE — Progress Notes (Signed)
Added Mg+ level today per NP verbal order. Receiving IVF today. K+ and Mg+ normal today. No need for electrolyte replacement.

## 2022-01-05 NOTE — Patient Instructions (Signed)
Romiplostim injection What is this medication? ROMIPLOSTIM (roe mi PLOE stim) helps your body make more platelets. This medicine is used to treat low platelets caused by chronic idiopathic thrombocytopenic purpura (ITP) or a bone marrow syndrome caused by radiation sickness. This medicine may be used for other purposes; ask your health care provider or pharmacist if you have questions. COMMON BRAND NAME(S): Nplate What should I tell my care team before I take this medication? They need to know if you have any of these conditions: blood clots myelodysplastic syndrome an unusual or allergic reaction to romiplostim, mannitol, other medicines, foods, dyes, or preservatives pregnant or trying to get pregnant breast-feeding How should I use this medication? This medicine is injected under the skin. It is given by a health care provider in a hospital or clinic setting. A special MedGuide will be given to you before each treatment. Be sure to read this information carefully each time. Talk to your health care provider about the use of this medicine in children. While it may be prescribed for children as young as newborns for selected conditions, precautions do apply. Overdosage: If you think you have taken too much of this medicine contact a poison control center or emergency room at once. NOTE: This medicine is only for you. Do not share this medicine with others. What if I miss a dose? Keep appointments for follow-up doses. It is important not to miss your dose. Call your health care provider if you are unable to keep an appointment. What may interact with this medication? Interactions are not expected. This list may not describe all possible interactions. Give your health care provider a list of all the medicines, herbs, non-prescription drugs, or dietary supplements you use. Also tell them if you smoke, drink alcohol, or use illegal drugs. Some items may interact with your medicine. What should I  watch for while using this medication? Visit your health care provider for regular checks on your progress. You may need blood work done while you are taking this medicine. Your condition will be monitored carefully while you are receiving this medicine. It is important not to miss any appointments. What side effects may I notice from receiving this medication? Side effects that you should report to your doctor or health care professional as soon as possible: allergic reactions (skin rash, itching or hives; swelling of the face, lips, or tongue) bleeding (bloody or black, tarry stools; red or dark brown urine; spitting up blood or brown material that looks like coffee grounds; red spots on the skin; unusual bruising or bleeding from the eyes, gums, or nose) blood clot (chest pain; shortness of breath; pain, swelling, or warmth in the leg) stroke (changes in vision; confusion; trouble speaking or understanding; severe headaches; sudden numbness or weakness of the face, arm or leg; trouble walking; dizziness; loss of balance or coordination) Side effects that usually do not require medical attention (report to your doctor or health care professional if they continue or are bothersome): diarrhea dizziness headache joint pain muscle pain stomach pain trouble sleeping This list may not describe all possible side effects. Call your doctor for medical advice about side effects. You may report side effects to FDA at 1-800-FDA-1088. Where should I keep my medication? This medicine is given in a hospital or clinic. It will not be stored at home. NOTE: This sheet is a summary. It may not cover all possible information. If you have questions about this medicine, talk to your doctor, pharmacist, or health care provider.  2022 Elsevier/Gold Standard (2021-07-21 00:00:00)   Rehydration, Adult Rehydration is the replacement of body fluids, salts, and minerals (electrolytes) that are lost during dehydration.  Dehydration is when there is not enough water or other fluids in the body. This happens when you lose more fluids than you take in. Common causes of dehydration include: Not drinking enough fluids. This can occur when you are ill or doing activities that require a lot of energy, especially in hot weather. Conditions that cause loss of water or other fluids, such as diarrhea, vomiting, sweating, or urinating a lot. Other illnesses, such as fever or infection. Certain medicines, such as those that remove excess fluid from the body (diuretics). Symptoms of mild or moderate dehydration may include thirst, dry lips and mouth, and dizziness. Symptoms of severe dehydration may include increased heart rate, confusion, fainting, and not urinating. For severe dehydration, you may need to get fluids through an IV at the hospital. For mild or moderate dehydration, you can usually rehydrate at home by drinking certain fluids as told by your health care provider. What are the risks? Generally, rehydration is safe. However, taking in too much fluid (overhydration) can be a problem. This is rare. Overhydration can cause an electrolyte imbalance, kidney failure, or a decrease in salt (sodium) levels in the body. Supplies needed You will need an oral rehydration solution (ORS) if your health care provider tells you to use one. This is a drink to treat dehydration. It can be found in pharmacies and retail stores. How to rehydrate Fluids Follow instructions from your health care provider for rehydration. The kind of fluid and the amount you should drink depend on your condition. In general, you should choose drinks that you prefer. If told by your health care provider, drink an ORS. Make an ORS by following instructions on the package. Start by drinking small amounts, about  cup (120 mL) every 5-10 minutes. Slowly increase how much you drink until you have taken the amount recommended by your health care  provider. Drink enough clear fluids to keep your urine pale yellow. If you were told to drink an ORS, finish it first, then start slowly drinking other clear fluids. Drink fluids such as: Water. This includes sparkling water and flavored water. Drinking only water can lead to having too little sodium in your body (hyponatremia). Follow the advice of your health care provider. Water from ice chips you suck on. Fruit juice with water you add to it (diluted). Sports drinks. Hot or cold herbal teas. Broth-based soups. Milk or milk products. Food Follow instructions from your health care provider about what to eat while you rehydrate. Your health care provider may recommend that you slowly begin eating regular foods in small amounts. Eat foods that contain a healthy balance of electrolytes, such as bananas, oranges, potatoes, tomatoes, and spinach. Avoid foods that are greasy or contain a lot of sugar. In some cases, you may get nutrition through a feeding tube that is passed through your nose and into your stomach (nasogastric tube, or NG tube). This may be done if you have uncontrolled vomiting or diarrhea. Beverages to avoid Certain beverages may make dehydration worse. While you rehydrate, avoid drinking alcohol. How to tell if you are recovering from dehydration You may be recovering from dehydration if: You are urinating more often than before you started rehydrating. Your urine is pale yellow. Your energy level improves. You vomit less frequently. You have diarrhea less frequently. Your appetite improves or returns to normal.  You feel less dizzy or less light-headed. Your skin tone and color start to look more normal. Follow these instructions at home: Take over-the-counter and prescription medicines only as told by your health care provider. Do not take sodium tablets. Doing this can lead to having too much sodium in your body (hypernatremia). Contact a health care provider if: You  continue to have symptoms of mild or moderate dehydration, such as: Thirst. Dry lips. Slightly dry mouth. Dizziness. Dark urine or less urine than normal. Muscle cramps. You continue to vomit or have diarrhea. Get help right away if you: Have symptoms of dehydration that get worse. Have a fever. Have a severe headache. Have been vomiting and the following happens: Your vomiting gets worse or does not go away. Your vomit includes blood or green matter (bile). You cannot eat or drink without vomiting. Have problems with urination or bowel movements, such as: Diarrhea that gets worse or does not go away. Blood in your stool (feces). This may cause stool to look black and tarry. Not urinating, or urinating only a small amount of very dark urine, within 6-8 hours. Have trouble breathing. Have symptoms that get worse with treatment. These symptoms may represent a serious problem that is an emergency. Do not wait to see if the symptoms will go away. Get medical help right away. Call your local emergency services (911 in the [place] Do not drive yourself to the hospital. Summary Rehydration is the replacement of body fluids and minerals (electrolytes) that are lost during dehydration. Follow instructions from your health care provider for rehydration. The kind of fluid and amount you should drink depend on your condition. Slowly increase how much you drink until you have taken the amount recommended by your health care provider. Contact your health care provider if you continue to show signs of mild or moderate dehydration. This information is not intended to replace advice given to you by your health care provider. Make sure you discuss any questions you have with your health care provider. Document Revised: 01/02/2020 Document Reviewed: 11/12/2019 Elsevier Patient Education  2022 [employer].

## 2022-01-05 NOTE — Progress Notes (Signed)
IV fluid orders placed to today.

## 2022-01-06 ENCOUNTER — Other Ambulatory Visit: Payer: Self-pay | Admitting: Nurse Practitioner

## 2022-01-06 ENCOUNTER — Telehealth: Payer: Self-pay | Admitting: Nurse Practitioner

## 2022-01-06 ENCOUNTER — Telehealth: Payer: Self-pay

## 2022-01-06 DIAGNOSIS — C250 Malignant neoplasm of head of pancreas: Secondary | ICD-10-CM

## 2022-01-06 NOTE — Telephone Encounter (Signed)
I contacted Aaron Johnston and his wife regarding the elevated creatinine on the blood work 01/05/2022.  Renal ultrasound has been ordered.  We are trying to get this scheduled for tomorrow.  Plan for repeat basic metabolic panel 02/03/2247.

## 2022-01-06 NOTE — Telephone Encounter (Signed)
-----   Message from Owens Shark, NP sent at 01/06/2022  3:40 PM EST ----- I ordered a renal ultrasound to be done tomorrow.  Please follow-up and make sure this gets scheduled.  Thanks (I called Mr. Laviolette and let him know)

## 2022-01-06 NOTE — Telephone Encounter (Signed)
Called and spoke with the patient to let him know he is schedule for 1600 for a renal ultrasound on 01/07/22. I also let the patient know to drink 32 oz water hour before his appointment. Patient voiced understanding of instruction and had no further questions or concerns at this time.

## 2022-01-07 ENCOUNTER — Other Ambulatory Visit: Payer: Self-pay

## 2022-01-07 ENCOUNTER — Encounter: Payer: Self-pay | Admitting: Oncology

## 2022-01-07 ENCOUNTER — Other Ambulatory Visit: Payer: Self-pay | Admitting: Oncology

## 2022-01-07 ENCOUNTER — Ambulatory Visit (HOSPITAL_BASED_OUTPATIENT_CLINIC_OR_DEPARTMENT_OTHER)
Admission: RE | Admit: 2022-01-07 | Discharge: 2022-01-07 | Disposition: A | Discharge status: Home / Self Care | Payer: Medicare Other | Source: Ambulatory Visit | Attending: Nurse Practitioner | Admitting: Nurse Practitioner

## 2022-01-07 ENCOUNTER — Encounter: Payer: Self-pay | Admitting: Nurse Practitioner

## 2022-01-07 DIAGNOSIS — C250 Malignant neoplasm of head of pancreas: Secondary | ICD-10-CM | POA: Insufficient documentation

## 2022-01-07 NOTE — Telephone Encounter (Signed)
Chart review.

## 2022-01-08 ENCOUNTER — Other Ambulatory Visit: Payer: Medicare Other

## 2022-01-08 ENCOUNTER — Encounter: Payer: Self-pay | Admitting: Oncology

## 2022-01-08 ENCOUNTER — Inpatient Hospital Stay: Payer: Medicare Other

## 2022-01-08 ENCOUNTER — Telehealth: Payer: Self-pay | Admitting: Nurse Practitioner

## 2022-01-08 ENCOUNTER — Encounter: Payer: Self-pay | Admitting: Nurse Practitioner

## 2022-01-08 DIAGNOSIS — C250 Malignant neoplasm of head of pancreas: Secondary | ICD-10-CM

## 2022-01-08 DIAGNOSIS — Z95828 Presence of other vascular implants and grafts: Secondary | ICD-10-CM

## 2022-01-08 DIAGNOSIS — D696 Thrombocytopenia, unspecified: Secondary | ICD-10-CM

## 2022-01-08 DIAGNOSIS — Z5111 Encounter for antineoplastic chemotherapy: Secondary | ICD-10-CM | POA: Diagnosis not present

## 2022-01-08 LAB — BASIC METABOLIC PANEL - CANCER CENTER ONLY
Anion gap: 10 (ref 5–15)
BUN: 38 mg/dL — ABNORMAL HIGH (ref 8–23)
CO2: 11 mmol/L — ABNORMAL LOW (ref 22–32)
Calcium: 8.5 mg/dL — ABNORMAL LOW (ref 8.9–10.3)
Chloride: 114 mmol/L — ABNORMAL HIGH (ref 98–111)
Creatinine: 2.81 mg/dL — ABNORMAL HIGH (ref 0.61–1.24)
GFR, Estimated: 23 mL/min — ABNORMAL LOW (ref >60)
Glucose, Bld: 149 mg/dL — ABNORMAL HIGH (ref 70–99)
Potassium: 4.7 mmol/L (ref 3.5–5.1)
Sodium: 135 mmol/L (ref 135–145)

## 2022-01-08 MED ORDER — SODIUM CHLORIDE 0.9% FLUSH
10.0000 mL | INTRAVENOUS | Status: DC | PRN
  Administered 2022-01-08: 10 mL

## 2022-01-08 MED ORDER — HEPARIN SOD (PORK) LOCK FLUSH 100 UNIT/ML IV SOLN
500.0000 [IU] | Freq: Once | INTRAVENOUS | Status: AC | PRN
  Administered 2022-01-08: 500 [IU]

## 2022-01-08 NOTE — Telephone Encounter (Signed)
I contacted Aaron Johnston regarding the renal ultrasound from yesterday and lab work from today.  There was no hydronephrosis.  Kidney function is stable.  He reports overall he is feeling well.  He will follow-up next week as scheduled.

## 2022-01-08 NOTE — Patient Instructions (Signed)

## 2022-01-11 ENCOUNTER — Other Ambulatory Visit: Payer: Self-pay | Admitting: Oncology

## 2022-01-12 ENCOUNTER — Inpatient Hospital Stay: Payer: Medicare Other

## 2022-01-12 ENCOUNTER — Other Ambulatory Visit: Payer: Self-pay

## 2022-01-12 ENCOUNTER — Inpatient Hospital Stay (HOSPITAL_BASED_OUTPATIENT_CLINIC_OR_DEPARTMENT_OTHER): Payer: Medicare Other | Admitting: Oncology

## 2022-01-12 ENCOUNTER — Encounter: Payer: Self-pay | Admitting: *Deleted

## 2022-01-12 VITALS — BP 108/67 | HR 77 | Temp 97.9°F | Resp 20 | Ht 69.0 in | Wt 119.0 lb

## 2022-01-12 VITALS — BP 116/71 | HR 79

## 2022-01-12 DIAGNOSIS — C250 Malignant neoplasm of head of pancreas: Secondary | ICD-10-CM

## 2022-01-12 DIAGNOSIS — Z95828 Presence of other vascular implants and grafts: Secondary | ICD-10-CM

## 2022-01-12 DIAGNOSIS — D696 Thrombocytopenia, unspecified: Secondary | ICD-10-CM

## 2022-01-12 DIAGNOSIS — Z5111 Encounter for antineoplastic chemotherapy: Secondary | ICD-10-CM | POA: Diagnosis not present

## 2022-01-12 LAB — CBC WITH DIFFERENTIAL (CANCER CENTER ONLY)
Abs Immature Granulocytes: 0.04 10*3/uL (ref 0.00–0.07)
Basophils Absolute: 0.1 10*3/uL (ref 0.0–0.1)
Basophils Relative: 1 %
Eosinophils Absolute: 0.2 10*3/uL (ref 0.0–0.5)
Eosinophils Relative: 2 %
HCT: 26.9 % — ABNORMAL LOW (ref 39.0–52.0)
Hemoglobin: 8.5 g/dL — ABNORMAL LOW (ref 13.0–17.0)
Immature Granulocytes: 0 %
Lymphocytes Relative: 6 %
Lymphs Abs: 0.6 10*3/uL — ABNORMAL LOW (ref 0.7–4.0)
MCH: 31 pg (ref 26.0–34.0)
MCHC: 31.6 g/dL (ref 30.0–36.0)
MCV: 98.2 fL (ref 80.0–100.0)
Monocytes Absolute: 0.7 10*3/uL (ref 0.1–1.0)
Monocytes Relative: 6 %
Neutro Abs: 9 10*3/uL — ABNORMAL HIGH (ref 1.7–7.7)
Neutrophils Relative %: 85 %
Platelet Count: 175 10*3/uL (ref 150–400)
RBC: 2.74 MIL/uL — ABNORMAL LOW (ref 4.22–5.81)
RDW: 18.3 % — ABNORMAL HIGH (ref 11.5–15.5)
WBC Count: 10.7 10*3/uL — ABNORMAL HIGH (ref 4.0–10.5)
nRBC: 0 % (ref 0.0–0.2)

## 2022-01-12 LAB — CMP (CANCER CENTER ONLY)
ALT: 47 U/L — ABNORMAL HIGH (ref 0–44)
AST: 28 U/L (ref 15–41)
Albumin: 3.5 g/dL (ref 3.5–5.0)
Alkaline Phosphatase: 415 U/L — ABNORMAL HIGH (ref 38–126)
Anion gap: 10 (ref 5–15)
BUN: 43 mg/dL — ABNORMAL HIGH (ref 8–23)
CO2: 12 mmol/L — ABNORMAL LOW (ref 22–32)
Calcium: 8.5 mg/dL — ABNORMAL LOW (ref 8.9–10.3)
Chloride: 115 mmol/L — ABNORMAL HIGH (ref 98–111)
Creatinine: 2.77 mg/dL — ABNORMAL HIGH (ref 0.61–1.24)
GFR, Estimated: 23 mL/min — ABNORMAL LOW (ref >60)
Glucose, Bld: 170 mg/dL — ABNORMAL HIGH (ref 70–99)
Potassium: 4.4 mmol/L (ref 3.5–5.1)
Sodium: 137 mmol/L (ref 135–145)
Total Bilirubin: 0.6 mg/dL (ref 0.3–1.2)
Total Protein: 6.3 g/dL — ABNORMAL LOW (ref 6.5–8.1)

## 2022-01-12 LAB — SAMPLE TO BLOOD BANK

## 2022-01-12 MED ORDER — SODIUM CHLORIDE 0.9 % IV SOLN
300.0000 mg/m2 | Freq: Once | INTRAVENOUS | Status: AC
  Administered 2022-01-12: 486 mg via INTRAVENOUS
  Filled 2022-01-12: qty 24.3

## 2022-01-12 MED ORDER — SODIUM CHLORIDE 0.9 % IV SOLN
10.0000 mg | Freq: Once | INTRAVENOUS | Status: AC
  Administered 2022-01-12: 10 mg via INTRAVENOUS
  Filled 2022-01-12: qty 1

## 2022-01-12 MED ORDER — ROMIPLOSTIM 125 MCG ~~LOC~~ SOLR: 2.0000 ug/kg | Freq: Once | SUBCUTANEOUS | Status: DC

## 2022-01-12 MED ORDER — PALONOSETRON HCL INJECTION 0.25 MG/5ML
0.2500 mg | Freq: Once | INTRAVENOUS | Status: AC
  Administered 2022-01-12: 0.25 mg via INTRAVENOUS
  Filled 2022-01-12: qty 5

## 2022-01-12 MED ORDER — SODIUM CHLORIDE 0.9 % IV SOLN: Freq: Once | INTRAVENOUS | Status: AC

## 2022-01-12 MED ORDER — SODIUM CHLORIDE 0.9 % IV SOLN
1800.0000 mg/m2 | INTRAVENOUS | Status: DC
  Administered 2022-01-12: 2900 mg via INTRAVENOUS
  Filled 2022-01-12: qty 58

## 2022-01-12 MED ORDER — SODIUM CHLORIDE 0.9 % IV SOLN
40.0000 mg/m2 | Freq: Once | INTRAVENOUS | Status: AC
  Administered 2022-01-12: 64.5 mg via INTRAVENOUS
  Filled 2022-01-12: qty 15

## 2022-01-12 NOTE — Progress Notes (Signed)
[facility] OFFICE PROGRESS NOTE   Diagnosis: Pancreas cancer  INTERVAL HISTORY:   Aaron Johnston returns as scheduled.  He is here with his wife.  He has improved nutrition intake over the past week.  No pain.  He has intermittent diarrhea.  No bleeding.  No new complaint.  Objective:  Vital signs in last 24 hours:  Blood pressure 108/67, pulse 77, temperature 97.9 F (36.6 C), resp. rate 20, height '5\' 9"'  (1.753 m), weight 119 lb (54 kg), SpO2 100 %.    HEENT: No thrush or ulcers Resp: Lungs clear bilaterally Cardio: Regular rate and rhythm GI: Soft, no mass, no hepatomegaly Vascular: No leg edema   Portacath/PICC-without erythema  Lab Results:  Lab Results  Component Value Date   WBC 10.7 (H) 01/12/2022   HGB 8.5 (L) 01/12/2022   HCT 26.9 (L) 01/12/2022   MCV 98.2 01/12/2022   PLT 175 01/12/2022   NEUTROABS 9.0 (H) 01/12/2022    CMP  Lab Results  Component Value Date   NA 137 01/12/2022   K 4.4 01/12/2022   CL 115 (H) 01/12/2022   CO2 12 (L) 01/12/2022   GLUCOSE 170 (H) 01/12/2022   BUN 43 (H) 01/12/2022   CREATININE 2.77 (H) 01/12/2022   CALCIUM 8.5 (L) 01/12/2022   PROT 6.3 (L) 01/12/2022   ALBUMIN 3.5 01/12/2022   AST 28 01/12/2022   ALT 47 (H) 01/12/2022   ALKPHOS 415 (H) 01/12/2022   BILITOT 0.6 01/12/2022   GFRNONAA 23 (L) 01/12/2022   GFRAA NOT CALCULATED 02/23/2021    Lab Results  Component Value Date   XBW620 3,288 (H) 12/29/2021      Medications: I have reviewed the patient's current medications.   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, mild biliary  ductal dilatation, diffuse hypodense/hypoenhancing areas within the liver (edema versus infiltrating neoplasm), 2 x 1.7 cm area of focal prominence in the pancreas EUS 07/10/2020-no pancreas mass identified, 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, 1.5 cm aortocaval node biopsy-scant lymphoid material, no malignancy Diagnostic laparoscopy 07/17/2020-segment 3 and 4 liver nodules, adenocarcinoma, positive for pankeratin, CK7 and CK20, partially positive for TTF-1, negative for CDX2 Foundation 1-microsatellite stable, tumor mutation burden 4, K-ras G12D, subclonal RB1 alteration Elevated CA 19-9 Cycle 1 FOLFOX 07/28/2020 Cycle 2 FOLFOX 08/18/2020, oxaliplatin dose reduced due to thrombocytopenia Cycle 3 FOLFOX 09/08/2020 Cycle 4 FOLFOX 09/29/2020  Cycle 5 FOLFOX 10/20/2020 CTs 11/06/2020-grossly stable infiltrative mass within the pancreatic head and porta hepatis.  New small low-density liver lesions.  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 Cycle 6 FOLFOX 11/10/2021  Cycle 7 FOLFOX 12/01/2020 MRI liver 12/04/2020-no significant change in posttreatment appearance of the pancreatic head.  Numerous intrinsically low signal hypoenhancing lesions of the liver parenchyma predominantly observed in the right lobe of the liver and liver dome, measuring no greater than 8 mm and as seen on recent prior CT of the abdomen/pelvis. Cycle 8 FOLFOX 12/22/2020 Cycle 9 FOLFOX 01/12/2021 Cycle 10 FOLFOX 02/02/2021 MRI liver 02/19/2021-no change in pancreas head mass, multiple hypoenhancing liver lesions are new and increased in size,  effacement of portal vein by pancreas head mass with cavernous transformation, no change in mild splenomegaly Cycle 1 gemcitabine/Abraxane 03/12/2021 Cycle 2 gemcitabine/Abraxane 03/27/2021- dose reductions secondary to neutropenia Cycle 3 gemcitabine/Abraxane  04/17/2021-dose reductions due to thrombocytopenia, white cell growth factor support added Cycle 4 gemcitabine/Abraxane 05/01/2021, Ziextenzo Cycle 5 gemcitabine/Abraxane 05/15/2021, Ziextenzo MRI abdomen 05/26/2021-mild enlargement of dominant hepatic metastases, appearance of infiltrative pancreas mass extending to the celiac trunk and SMA, splenic vein occlusion, cavernous transformation of the portal vein with upper abdomen collaterals, splenomegaly, mild upper abdominal ascites and mesenteric edema, minimal subpleural nodularity left lower lobe Cycle 6 gemcitabine/Abraxane 05/28/2021 Cycle 7 gemcitabine/Abraxane 06/12/2021 Cycle 8 gemcitabine/Abraxane 06/26/2021 Cycle 9 gemcitabine/Abraxane 07/10/2021 Cycle 10 gemcitabine/Abraxane 07/23/2021 Cycle 11 gemcitabine/Abraxane 08/06/2021 MRI abdomen 08/20/2021-multifocal nodularity in the lower chest with a new discrete right lower lobe nodule, enlarging subsegment 7/8 liver lesion, other small lesions appear stable, gastric varices, review of images with radiology-enlargement of at least 2 liver lesions, suspicious nodule n the right lower lung Cycle 1  5-FU/liposomal irinotecan 09/08/2021 Cycle 2  5-FU/liposomal irinotecan 09/22/2021 Cycle 3  5-FU/liposomal irinotecan 10/20/2021 Cycle 4 5-FU/liposomal irinotecan 11/04/2021 Cycle 5 5-FU/liposomal irinotecan 11/17/2021 MRI abdomen 11/27/2021-generally unchanged appearance of the pancreas with a very ill-defined hypoenhancing mass centered within the pancreatic neck, again appears to encase the celiac axis and encasing occlude the superior mesenteric vein and splenic vein; interval enlargement of multiple liver lesions; numerous lung nodules in the bilateral lung bases; unchanged splenomegaly; multiple new heterogeneously and hypoenhancing subcapsular lesions of the spleen most consistent with infarctions; small volume ascites throughout the abdomen; distended, debris-filled stomach suggesting some degree of gastric  outlet and/or duodenal obstruction. 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   Admission with recurrent GI bleeding and severe anemia 09/15/2021 Upper endoscopy 09/17/2021-esophageal varices with stigmata of recent bleeding, banded.  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  Influenza A 10/13/2021 11.  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  His performance status has partially improved over the past few weeks.  He has developed renal insufficiency of unclear etiology.  It is possible he had acute renal damage when he became dehydrated a few weeks ago.  No hydronephrosis on ultrasound.  We discussed comfort care versus continuing 5-FU/liposomal irinotecan.  The CA 19-9 was lower on 12/29/2021.  He would like to continue chemotherapy.  Aaron Johnston will complete another cycle of chemotherapy today.  He will return for  a lab visit in 1 week.  He will return for an office visit and another cycle of chemotherapy in 2 weeks.  We will continue following the CA 19-9 and plan for a restaging MRI in 1-2 months.  Betsy Coder, MD  01/12/2022  12:02 PM

## 2022-01-12 NOTE — Progress Notes (Addendum)
Patient seen by Dr. Benay Spice today  Vitals are within treatment parameters.  Labs reviewed by Dr. Benay Spice and are not all within treatment parameters.   Creatinine 2.77--OK to treat   Per physician team, patient is ready for treatment and there are NO modifications to the treatment plan.   Per Dr. Benay Spice: Hold Nplate this week.

## 2022-01-12 NOTE — Patient Instructions (Addendum)
[place] [facility]   The chemotherapy medication bag should finish at 46 hours, 96 hours, or 7 days. For example, if your pump is scheduled for 46 hours and it was put on at 4:00 p.m., it should finish at 2:00 p.m. the day it is scheduled to come off regardless of your appointment time.     Estimated time to finish at 12:45 Thursday, 01/14/22.   If the display on your pump reads "Low Volume" and it is beeping, take the batteries out of the pump and come to the cancer center for it to be taken off.   If the pump alarms go off prior to the pump reading "Low Volume" then call [phone] and someone can assist you.  If the plunger comes out and the chemotherapy medication is leaking out, please use your home chemo spill kit to clean up the spill. Do NOT use paper towels or other household products.  If you have problems or questions regarding your pump, please call either [phone] (24 hours a day) or the cancer center Monday-Friday 8:00 a.m.- 4:30 p.m. at the clinic number and we will assist you. If you are unable to get assistance, then go to the nearest Emergency Department and ask the staff to contact the IV team for assistance.  Discharge Instructions: Thank you for choosing [facility] to provide your oncology and hematology care.   If you have a lab appointment with the [facility], please go directly to the [facility] and check in at the registration area.   Wear comfortable clothing and clothing appropriate for easy access to any Portacath or PICC line.   We strive to give you quality time with your provider. You may need to reschedule your appointment if you arrive late (15 or more minutes).  Arriving late affects you and other patients whose appointments are after yours.  Also, if you miss three or more appointments without notifying the office, you may be dismissed from the clinic at the providers discretion.      For prescription refill  requests, have your pharmacy contact our office and allow 72 hours for refills to be completed.    Today you received the following chemotherapy and/or immunotherapy agents Irinotecan Liposome, Leucovorin, Fluorouracil      To help prevent nausea and vomiting after your treatment, we encourage you to take your nausea medication as directed.  BELOW ARE SYMPTOMS THAT SHOULD BE REPORTED IMMEDIATELY: *FEVER GREATER THAN 100.4 F (38 C) OR HIGHER *CHILLS OR SWEATING *NAUSEA AND VOMITING THAT IS NOT CONTROLLED WITH YOUR NAUSEA MEDICATION *UNUSUAL SHORTNESS OF BREATH *UNUSUAL BRUISING OR BLEEDING *URINARY PROBLEMS (pain or burning when urinating, or frequent urination) *BOWEL PROBLEMS (unusual diarrhea, constipation, pain near the anus) TENDERNESS IN MOUTH AND THROAT WITH OR WITHOUT PRESENCE OF ULCERS (sore throat, sores in mouth, or a toothache) UNUSUAL RASH, SWELLING OR PAIN  UNUSUAL VAGINAL DISCHARGE OR ITCHING   Items with * indicate a potential emergency and should be followed up as soon as possible or go to the Emergency Department if any problems should occur.  Please show the CHEMOTHERAPY ALERT CARD or IMMUNOTHERAPY ALERT CARD at check-in to the Emergency Department and triage nurse.  Should you have questions after your visit or need to cancel or reschedule your appointment, please contact [facility]  Dept: [phone]  and follow the prompts.  Office hours are 8:00 a.m. to 4:30 p.m. Monday - Friday. Please note that voicemails left  after 4:00 p.m. may not be returned until the following business day.  We are closed weekends and major holidays. You have access to a nurse at all times for urgent questions. Please call the main number to the clinic Dept: [phone] and follow the prompts.   For any non-urgent questions, you may also contact your provider using MyChart. We now offer e-Visits for anyone 59 and older to request care online for non-urgent  symptoms. For details visit [email or web address].   Also download the MyChart app! Go to the app store, search "MyChart", open the app, select [facility], and log in with your MyChart username and password.  Due to Covid, a mask is required upon entering the hospital/clinic. If you do not have a mask, one will be given to you upon arrival. For doctor visits, patients may have 1 support person aged 54 or older with them. For treatment visits, patients cannot have anyone with them due to current Covid guidelines and our immunocompromised population.   Irinotecan Liposomal injection What is this medication? IRINOTECAN LIPOSOME (eye ri noe TEE kan LIP oh som) is a chemotherapy drug. It is used to treat pancreatic cancer. This medicine may be used for other purposes; ask your health care provider or pharmacist if you have questions. COMMON BRAND NAME(S): ONIVYDE What should I tell my care team before I take this medication? They need to know if you have any of these conditions: bleeding disorders dehydration history of blood diseases, like sickle cell anemia or leukemia history of low levels of calcium, magnesium, or potassium in the blood infection (especially a virus infection such as chickenpox, cold sores, or herpes) liver disease low blood counts, like low white cell, platelet, or red cell counts lung or breathing disease, like asthma recent or ongoing radiation therapy an unusual or allergic reaction to irinotecan liposome, other medicines, foods, dyes, or preservatives pregnant or trying to get pregnant breast-feeding How should I use this medication? This drug is given as an infusion into a vein. It is administered in a hospital or clinic by a specially trained health care professional. Talk to your pediatrician regarding the use of this medicine in children. Special care may be needed. Overdosage: If you think you have taken too much of this medicine contact a poison control center  or emergency room at once. NOTE: This medicine is only for you. Do not share this medicine with others. What if I miss a dose? It is important not to miss your dose. Call your doctor or health care professional if you are unable to keep an appointment. What may interact with this medication? This medicine may interact with the following medications: antiviral medicines for HIV or AIDS certain medications for fungal infections like ketoconazole, itraconazole, voriconazole certain medications for seizures like carbamazepine, fosphenytoin, phenytoin clarithromycin gemfibrozil mephobarbital nefazodone phenobarbital primidone rifabutin rifampin rifapentine [facility]. John's Wort telaprevir This list may not describe all possible interactions. Give your health care provider a list of all the medicines, herbs, non-prescription drugs, or dietary supplements you use. Also tell them if you smoke, drink alcohol, or use illegal drugs. Some items may interact with your medicine. What should I watch for while using this medication? Check with your doctor or health care professional if you get an attack of severe diarrhea, nausea and vomiting, or if you sweat a lot. The loss of too much body fluid can make it dangerous for you to take this medicine. This medicine may interfere with the ability to have a  child. You should talk with your doctor or health care professional if you are concerned about your fertility. Do not become pregnant while taking this medicine or for 1 month after the last dose; males with male partners should use condoms during treatment and for 4 months after the last dose. Women should inform their doctor if they wish to become pregnant or think they might be pregnant. There is a potential for serious side effects to an unborn child. Talk to your health care professional or pharmacist for more information. Do not breast-feed an infant while taking this medicine or for 1 month after the last  dose. Avoid taking products that contain aspirin, acetaminophen, ibuprofen, naproxen, or ketoprofen unless instructed by your doctor. These medicines may hide a fever. Be careful brushing and flossing your teeth or using a toothpick because you may get an infection or bleed more easily. If you have any dental work done, tell your dentist you are receiving this medicine. Call your doctor or health care professional for advice if you get a fever, chills or sore throat, or other symptoms of a cold or flu. Do not treat yourself. This drug decreases your body's ability to fight infections. Try to avoid being around people who are sick. This medicine may increase your risk to bruise or bleed. Call your doctor or health care professional if you notice any unusual bleeding. This drug may make you feel generally unwell. This is not uncommon, as chemotherapy can affect healthy cells as well as cancer cells. Report any side effects. Continue your course of treatment even though you feel ill unless your doctor tells you to stop. What side effects may I notice from receiving this medication? Side effects that you should report to your doctor or health care professional as soon as possible: allergic reactions like skin rash, itching or hives, swelling of the face, lips, or tongue breathing problems cough diarrhea low blood counts - this medicine may decrease the number of white blood cells, red blood cells and platelets. You may be at increased risk for infections and bleeding nausea, vomiting signs and symptoms of bleeding such as bloody or black, tarry stools; red or dark-brown urine; spitting up blood or brown material that looks like coffee grounds; red spots on the skin; unusual bruising or bleeding from the eye, gums, or nose signs of decreased red blood cells - unusually weak or tired, feeling faint or lightheaded, falls signs and symptoms of infection like fever or chills; cough; sore throat; pain or  trouble passing urine signs and symptoms of liver injury like dark yellow or brown urine; general ill feeling or flu-like symptoms; light-colored stools; loss of appetite; nausea; right upper belly pain; unusually weak or tired; yellowing of the eyes or skin stomach pain Side effects that usually do not require medical attention (report to your doctor or health care professional if they continue or are bothersome): hair loss loss of appetite mouth sores upset stomach This list may not describe all possible side effects. Call your doctor for medical advice about side effects. You may report side effects to FDA at 1-800-FDA-1088. Where should I keep my medication? This drug is given in a hospital or clinic and will not be stored at home. NOTE: This sheet is a summary. It may not cover all possible information. If you have questions about this medicine, talk to your doctor, pharmacist, or health care provider.  2022 Elsevier/Gold Standard (2015-12-04 00:00:00)  Leucovorin injection What is this medication? LEUCOVORIN (loo  koe VOR in) is used to prevent or treat the harmful effects of some medicines. This medicine is used to treat anemia caused by a low amount of folic acid in the body. It is also used with 5-fluorouracil (5-FU) to treat colon cancer. This medicine may be used for other purposes; ask your health care provider or pharmacist if you have questions. What should I tell my care team before I take this medication? They need to know if you have any of these conditions: anemia from low levels of vitamin B-12 in the blood an unusual or allergic reaction to leucovorin, folic acid, other medicines, foods, dyes, or preservatives pregnant or trying to get pregnant breast-feeding How should I use this medication? This medicine is for injection into a muscle or into a vein. It is given by a health care professional in a hospital or clinic setting. Talk to your pediatrician regarding the use  of this medicine in children. Special care may be needed. Overdosage: If you think you have taken too much of this medicine contact a poison control center or emergency room at once. NOTE: This medicine is only for you. Do not share this medicine with others. What if I miss a dose? This does not apply. What may interact with this medication? capecitabine fluorouracil phenobarbital phenytoin primidone trimethoprim-sulfamethoxazole This list may not describe all possible interactions. Give your health care provider a list of all the medicines, herbs, non-prescription drugs, or dietary supplements you use. Also tell them if you smoke, drink alcohol, or use illegal drugs. Some items may interact with your medicine. What should I watch for while using this medication? Your condition will be monitored carefully while you are receiving this medicine. This medicine may increase the side effects of 5-fluorouracil, 5-FU. Tell your doctor or health care professional if you have diarrhea or mouth sores that do not get better or that get worse. What side effects may I notice from receiving this medication? Side effects that you should report to your doctor or health care professional as soon as possible: allergic reactions like skin rash, itching or hives, swelling of the face, lips, or tongue breathing problems fever, infection mouth sores unusual bleeding or bruising unusually weak or tired Side effects that usually do not require medical attention (report to your doctor or health care professional if they continue or are bothersome): constipation or diarrhea loss of appetite nausea, vomiting This list may not describe all possible side effects. Call your doctor for medical advice about side effects. You may report side effects to FDA at 1-800-FDA-1088. Where should I keep my medication? This drug is given in a hospital or clinic and will not be stored at home. NOTE: This sheet is a summary. It  may not cover all possible information. If you have questions about this medicine, talk to your doctor, pharmacist, or health care provider.  2022 Elsevier/Gold Standard (2008-05-09 00:00:00)  Fluorouracil, 5-FU injection What is this medication? FLUOROURACIL, 5-FU (flure oh YOOR a sil) is a chemotherapy drug. It slows the growth of cancer cells. This medicine is used to treat many types of cancer like breast cancer, colon or rectal cancer, pancreatic cancer, and stomach cancer. This medicine may be used for other purposes; ask your health care provider or pharmacist if you have questions. COMMON BRAND NAME(S): Adrucil What should I tell my care team before I take this medication? They need to know if you have any of these conditions: blood disorders dihydropyrimidine dehydrogenase (DPD) deficiency infection (especially  a virus infection such as chickenpox, cold sores, or herpes) kidney disease liver disease malnourished, poor nutrition recent or ongoing radiation therapy an unusual or allergic reaction to fluorouracil, other chemotherapy, other medicines, foods, dyes, or preservatives pregnant or trying to get pregnant breast-feeding How should I use this medication? This drug is given as an infusion or injection into a vein. It is administered in a hospital or clinic by a specially trained health care professional. Talk to your pediatrician regarding the use of this medicine in children. Special care may be needed. Overdosage: If you think you have taken too much of this medicine contact a poison control center or emergency room at once. NOTE: This medicine is only for you. Do not share this medicine with others. What if I miss a dose? It is important not to miss your dose. Call your doctor or health care professional if you are unable to keep an appointment. What may interact with this medication? Do not take this medicine with any of the following medications: live virus  vaccines This medicine may also interact with the following medications: medicines that treat or prevent blood clots like warfarin, enoxaparin, and dalteparin This list may not describe all possible interactions. Give your health care provider a list of all the medicines, herbs, non-prescription drugs, or dietary supplements you use. Also tell them if you smoke, drink alcohol, or use illegal drugs. Some items may interact with your medicine. What should I watch for while using this medication? Visit your doctor for checks on your progress. This drug may make you feel generally unwell. This is not uncommon, as chemotherapy can affect healthy cells as well as cancer cells. Report any side effects. Continue your course of treatment even though you feel ill unless your doctor tells you to stop. In some cases, you may be given additional medicines to help with side effects. Follow all directions for their use. Call your doctor or health care professional for advice if you get a fever, chills or sore throat, or other symptoms of a cold or flu. Do not treat yourself. This drug decreases your body's ability to fight infections. Try to avoid being around people who are sick. This medicine may increase your risk to bruise or bleed. Call your doctor or health care professional if you notice any unusual bleeding. Be careful brushing and flossing your teeth or using a toothpick because you may get an infection or bleed more easily. If you have any dental work done, tell your dentist you are receiving this medicine. Avoid taking products that contain aspirin, acetaminophen, ibuprofen, naproxen, or ketoprofen unless instructed by your doctor. These medicines may hide a fever. Do not become pregnant while taking this medicine. Women should inform their doctor if they wish to become pregnant or think they might be pregnant. There is a potential for serious side effects to an unborn child. Talk to your health care  professional or pharmacist for more information. Do not breast-feed an infant while taking this medicine. Men should inform their doctor if they wish to father a child. This medicine may lower sperm counts. Do not treat diarrhea with over the counter products. Contact your doctor if you have diarrhea that lasts more than 2 days or if it is severe and watery. This medicine can make you more sensitive to the sun. Keep out of the sun. If you cannot avoid being in the sun, wear protective clothing and use sunscreen. Do not use sun lamps or tanning beds/booths. What  side effects may I notice from receiving this medication? Side effects that you should report to your doctor or health care professional as soon as possible: allergic reactions like skin rash, itching or hives, swelling of the face, lips, or tongue low blood counts - this medicine may decrease the number of white blood cells, red blood cells and platelets. You may be at increased risk for infections and bleeding. signs of infection - fever or chills, cough, sore throat, pain or difficulty passing urine signs of decreased platelets or bleeding - bruising, pinpoint red spots on the skin, black, tarry stools, blood in the urine signs of decreased red blood cells - unusually weak or tired, fainting spells, lightheadedness breathing problems changes in vision chest pain mouth sores nausea and vomiting pain, swelling, redness at site where injected pain, tingling, numbness in the hands or feet redness, swelling, or sores on hands or feet stomach pain unusual bleeding Side effects that usually do not require medical attention (report to your doctor or health care professional if they continue or are bothersome): changes in finger or toe nails diarrhea dry or itchy skin hair loss headache loss of appetite sensitivity of eyes to the light stomach upset unusually teary eyes This list may not describe all possible side effects. Call your  doctor for medical advice about side effects. You may report side effects to FDA at 1-800-FDA-1088. Where should I keep my medication? This drug is given in a hospital or clinic and will not be stored at home. NOTE: This sheet is a summary. It may not cover all possible information. If you have questions about this medicine, talk to your doctor, pharmacist, or health care provider.  2022 Elsevier/Gold Standard (2021-07-21 00:00:00)

## 2022-01-12 NOTE — Progress Notes (Signed)
Patient presents for treatment. RN assessment completed along with the following:  Labs/vitals reviewed - Yes, and Ok to treat with Creat. 2.77    Weight within 10% of previous measurement - Yes Informed consent completed and reflects current therapy/intent - Yes, on date 09/08/21             Provider progress note reviewed -  2 /17/23 Treatment/Antibody/Supportive plan reviewed - Yes, and there are no adjustments needed for today's treatment. S&H and other orders reviewed - Yes, and there are no additional orders identified. Previous treatment date reviewed - Yes, and the appropriate amount of time has elapsed between treatments. Clinic Hand Off Received from - Cristy Friedlander, RN  Patient to proceed with treatment.

## 2022-01-13 LAB — CANCER ANTIGEN 19-9: CA 19-9: 4739 U/mL — ABNORMAL HIGH (ref 0–35)

## 2022-01-14 ENCOUNTER — Other Ambulatory Visit: Payer: Self-pay

## 2022-01-14 ENCOUNTER — Inpatient Hospital Stay: Payer: Medicare Other | Attending: Genetic Counselor

## 2022-01-14 VITALS — BP 115/59 | HR 66 | Temp 97.5°F | Resp 19

## 2022-01-14 DIAGNOSIS — Z5111 Encounter for antineoplastic chemotherapy: Secondary | ICD-10-CM | POA: Diagnosis present

## 2022-01-14 DIAGNOSIS — C250 Malignant neoplasm of head of pancreas: Secondary | ICD-10-CM | POA: Diagnosis present

## 2022-01-14 DIAGNOSIS — D709 Neutropenia, unspecified: Secondary | ICD-10-CM | POA: Insufficient documentation

## 2022-01-14 DIAGNOSIS — I700 Atherosclerosis of aorta: Secondary | ICD-10-CM | POA: Insufficient documentation

## 2022-01-14 DIAGNOSIS — Z5189 Encounter for other specified aftercare: Secondary | ICD-10-CM | POA: Insufficient documentation

## 2022-01-14 DIAGNOSIS — D696 Thrombocytopenia, unspecified: Secondary | ICD-10-CM | POA: Insufficient documentation

## 2022-01-14 DIAGNOSIS — C787 Secondary malignant neoplasm of liver and intrahepatic bile duct: Secondary | ICD-10-CM | POA: Diagnosis present

## 2022-01-14 MED ORDER — HEPARIN SOD (PORK) LOCK FLUSH 100 UNIT/ML IV SOLN
500.0000 [IU] | Freq: Once | INTRAVENOUS | Status: AC | PRN
  Administered 2022-01-14: 500 [IU]

## 2022-01-14 MED ORDER — SODIUM CHLORIDE 0.9% FLUSH
10.0000 mL | INTRAVENOUS | Status: DC | PRN
  Administered 2022-01-14: 10 mL

## 2022-01-14 MED ORDER — PEGFILGRASTIM-BMEZ 6 MG/0.6ML ~~LOC~~ SOSY
6.0000 mg | PREFILLED_SYRINGE | Freq: Once | SUBCUTANEOUS | Status: AC
  Administered 2022-01-14: 6 mg via SUBCUTANEOUS
  Filled 2022-01-14: qty 0.6

## 2022-01-14 NOTE — Progress Notes (Signed)
Pt returned for pump stop today. Pt reports that he cut his tubing line but it was almost empty and no chemo was leaked. Noted that line was severed about inches from port access ?

## 2022-01-14 NOTE — Patient Instructions (Signed)

## 2022-01-21 ENCOUNTER — Inpatient Hospital Stay: Payer: Medicare Other

## 2022-01-21 ENCOUNTER — Other Ambulatory Visit: Payer: Self-pay

## 2022-01-21 ENCOUNTER — Encounter: Payer: Self-pay | Admitting: *Deleted

## 2022-01-21 VITALS — BP 113/69 | HR 64 | Temp 97.5°F | Resp 18 | Wt 119.0 lb

## 2022-01-21 DIAGNOSIS — Z5111 Encounter for antineoplastic chemotherapy: Secondary | ICD-10-CM | POA: Diagnosis not present

## 2022-01-21 DIAGNOSIS — C250 Malignant neoplasm of head of pancreas: Secondary | ICD-10-CM

## 2022-01-21 DIAGNOSIS — Z95828 Presence of other vascular implants and grafts: Secondary | ICD-10-CM

## 2022-01-21 DIAGNOSIS — D696 Thrombocytopenia, unspecified: Secondary | ICD-10-CM

## 2022-01-21 LAB — CBC WITH DIFFERENTIAL (CANCER CENTER ONLY)
Abs Immature Granulocytes: 0.32 10*3/uL — ABNORMAL HIGH (ref 0.00–0.07)
Basophils Absolute: 0.1 10*3/uL (ref 0.0–0.1)
Basophils Relative: 1 %
Eosinophils Absolute: 0.2 10*3/uL (ref 0.0–0.5)
Eosinophils Relative: 2 %
HCT: 25.4 % — ABNORMAL LOW (ref 39.0–52.0)
Hemoglobin: 7.8 g/dL — ABNORMAL LOW (ref 13.0–17.0)
Immature Granulocytes: 4 %
Lymphocytes Relative: 4 %
Lymphs Abs: 0.4 10*3/uL — ABNORMAL LOW (ref 0.7–4.0)
MCH: 30.8 pg (ref 26.0–34.0)
MCHC: 30.7 g/dL (ref 30.0–36.0)
MCV: 100.4 fL — ABNORMAL HIGH (ref 80.0–100.0)
Monocytes Absolute: 0.3 10*3/uL (ref 0.1–1.0)
Monocytes Relative: 4 %
Neutro Abs: 7.4 10*3/uL (ref 1.7–7.7)
Neutrophils Relative %: 85 %
Platelet Count: 83 10*3/uL — ABNORMAL LOW (ref 150–400)
RBC: 2.53 MIL/uL — ABNORMAL LOW (ref 4.22–5.81)
RDW: 17.5 % — ABNORMAL HIGH (ref 11.5–15.5)
WBC Count: 8.7 10*3/uL (ref 4.0–10.5)
nRBC: 0 % (ref 0.0–0.2)

## 2022-01-21 LAB — BASIC METABOLIC PANEL - CANCER CENTER ONLY
Anion gap: 11 (ref 5–15)
BUN: 63 mg/dL — ABNORMAL HIGH (ref 8–23)
CO2: 9 mmol/L — ABNORMAL LOW (ref 22–32)
Calcium: 8.4 mg/dL — ABNORMAL LOW (ref 8.9–10.3)
Chloride: 118 mmol/L — ABNORMAL HIGH (ref 98–111)
Creatinine: 3.34 mg/dL (ref 0.61–1.24)
GFR, Estimated: 19 mL/min — ABNORMAL LOW (ref >60)
Glucose, Bld: 192 mg/dL — ABNORMAL HIGH (ref 70–99)
Potassium: 5.1 mmol/L (ref 3.5–5.1)
Sodium: 138 mmol/L (ref 135–145)

## 2022-01-21 LAB — SAMPLE TO BLOOD BANK

## 2022-01-21 MED ORDER — SODIUM CHLORIDE 0.9% FLUSH
10.0000 mL | INTRAVENOUS | Status: DC | PRN
  Administered 2022-01-21: 11:00:00 10 mL

## 2022-01-21 MED ORDER — ROMIPLOSTIM 125 MCG ~~LOC~~ SOLR
2.0000 ug/kg | Freq: Once | SUBCUTANEOUS | Status: AC
  Administered 2022-01-21: 12:00:00 110 ug via SUBCUTANEOUS
  Filled 2022-01-21: qty 0.22

## 2022-01-21 MED ORDER — HEPARIN SOD (PORK) LOCK FLUSH 100 UNIT/ML IV SOLN
500.0000 [IU] | Freq: Once | INTRAVENOUS | Status: AC | PRN
  Administered 2022-01-21: 11:00:00 500 [IU]

## 2022-01-21 NOTE — Progress Notes (Signed)
CRITICAL VALUE STICKER ? ?CRITICAL VALUE: Creatinine 3.34 (was 2.77 on 01/12/22) ? ?RECEIVER (on-site recipient of call):Lizzie Cokley,RN ? ?DATE & TIME NOTIFIED: 3/09/223 @ 1117 ? ?MESSENGER (representative from lab):Otila Kluver ? ?MD NOTIFIED: Dr. Benay Spice ? ?TIME OF NOTIFICATION:1125 ? ?RESPONSE:  Needs to push po fluids. ?

## 2022-01-21 NOTE — Patient Instructions (Signed)
[facility]  ?Discharge Instructions: ?Thank you for choosing [facility] to provide your oncology and hematology care.  ? ?If you have a lab appointment with the [facility], please go directly to the [facility] and check in at the registration area. ?  ?Wear comfortable clothing and clothing appropriate for easy access to any Portacath or PICC line.  ? ?We strive to give you quality time with your provider. You may need to reschedule your appointment if you arrive late (15 or more minutes).  Arriving late affects you and other patients whose appointments are after yours.  Also, if you miss three or more appointments without notifying the office, you may be dismissed from the clinic at the provider?s discretion.    ?  ?For prescription refill requests, have your pharmacy contact our office and allow 72 hours for refills to be completed.   ? ?Today you received the following chemotherapy and/or immunotherapy agents N Plate    ?  ?To help prevent nausea and vomiting after your treatment, we encourage you to take your nausea medication as directed. ? ?BELOW ARE SYMPTOMS THAT SHOULD BE REPORTED IMMEDIATELY: ?*FEVER GREATER THAN 100.4 F (38 ?C) OR HIGHER ?*CHILLS OR SWEATING ?*NAUSEA AND VOMITING THAT IS NOT CONTROLLED WITH YOUR NAUSEA MEDICATION ?*UNUSUAL SHORTNESS OF BREATH ?*UNUSUAL BRUISING OR BLEEDING ?*URINARY PROBLEMS (pain or burning when urinating, or frequent urination) ?*BOWEL PROBLEMS (unusual diarrhea, constipation, pain near the anus) ?TENDERNESS IN MOUTH AND THROAT WITH OR WITHOUT PRESENCE OF ULCERS (sore throat, sores in mouth, or a toothache) ?UNUSUAL RASH, SWELLING OR PAIN  ?UNUSUAL VAGINAL DISCHARGE OR ITCHING  ? ?Items with * indicate a potential emergency and should be followed up as soon as possible or go to the Emergency Department if any problems should occur. ? ?Please show the CHEMOTHERAPY ALERT CARD or IMMUNOTHERAPY ALERT CARD at check-in to the  Emergency Department and triage nurse. ? ?Should you have questions after your visit or need to cancel or reschedule your appointment, please contact [facility]  Dept: [phone]  and follow the prompts.  Office hours are 8:00 a.m. to 4:30 p.m. Monday - Friday. Please note that voicemails left after 4:00 p.m. may not be returned until the following business day.  We are closed weekends and major holidays. You have access to a nurse at all times for urgent questions. Please call the main number to the clinic Dept: [phone] and follow the prompts. ? ? ?For any non-urgent questions, you may also contact your provider using MyChart. We now offer e-Visits for anyone 90 and older to request care online for non-urgent symptoms. For details visit [email or web address]. ?  ?Also download the MyChart app! Go to the app store, search "MyChart", open the app, select [facility], and log in with your MyChart username and password. ? ?Due to Covid, a mask is required upon entering the hospital/clinic. If you do not have a mask, one will be given to you upon arrival. For doctor visits, patients may have 1 support person aged 42 or older with them. For treatment visits, patients cannot have anyone with them due to current Covid guidelines and our immunocompromised population.  ? ?Romiplostim injection ?What is this medication? ?ROMIPLOSTIM (roe mi PLOE stim) helps your body make more platelets. This medicine is used to treat low platelets caused by chronic idiopathic thrombocytopenic purpura (ITP) or a bone marrow syndrome caused by radiation sickness. ?This medicine may be used for other  purposes; ask your health care provider or pharmacist if you have questions. ?COMMON BRAND NAME(S): Nplate ?What should I tell my care team before I take this medication? ?They need to know if you have any of these conditions: ?blood clots ?myelodysplastic syndrome ?an unusual or allergic reaction to  romiplostim, mannitol, other medicines, foods, dyes, or preservatives ?pregnant or trying to get pregnant ?breast-feeding ?How should I use this medication? ?This medicine is injected under the skin. It is given by a health care provider in a hospital or clinic setting. ?A special MedGuide will be given to you before each treatment. Be sure to read this information carefully each time. ?Talk to your health care provider about the use of this medicine in children. While it may be prescribed for children as young as newborns for selected conditions, precautions do apply. ?Overdosage: If you think you have taken too much of this medicine contact a poison control center or emergency room at once. ?NOTE: This medicine is only for you. Do not share this medicine with others. ?What if I miss a dose? ?Keep appointments for follow-up doses. It is important not to miss your dose. Call your health care provider if you are unable to keep an appointment. ?What may interact with this medication? ?Interactions are not expected. ?This list may not describe all possible interactions. Give your health care provider a list of all the medicines, herbs, non-prescription drugs, or dietary supplements you use. Also tell them if you smoke, drink alcohol, or use illegal drugs. Some items may interact with your medicine. ?What should I watch for while using this medication? ?Visit your health care provider for regular checks on your progress. You may need blood work done while you are taking this medicine. Your condition will be monitored carefully while you are receiving this medicine. It is important not to miss any appointments. ?What side effects may I notice from receiving this medication? ?Side effects that you should report to your doctor or health care professional as soon as possible: ?allergic reactions (skin rash, itching or hives; swelling of the face, lips, or tongue) ?bleeding (bloody or black, tarry stools; red or dark brown  urine; spitting up blood or brown material that looks like coffee grounds; red spots on the skin; unusual bruising or bleeding from the eyes, gums, or nose) ?blood clot (chest pain; shortness of breath; pain, swelling, or warmth in the leg) ?stroke (changes in vision; confusion; trouble speaking or understanding; severe headaches; sudden numbness or weakness of the face, arm or leg; trouble walking; dizziness; loss of balance or coordination) ?Side effects that usually do not require medical attention (report to your doctor or health care professional if they continue or are bothersome): ?diarrhea ?dizziness ?headache ?joint pain ?muscle pain ?stomach pain ?trouble sleeping ?This list may not describe all possible side effects. Call your doctor for medical advice about side effects. You may report side effects to FDA at 1-800-FDA-1088. ?Where should I keep my medication? ?This medicine is given in a hospital or clinic. It will not be stored at home. ?NOTE: This sheet is a summary. It may not cover all possible information. If you have questions about this medicine, talk to your doctor, pharmacist, or health care provider. ?? 2022 Elsevier/Gold Standard (2021-07-21 00:00:00) ? ?

## 2022-01-22 ENCOUNTER — Encounter: Payer: Self-pay | Admitting: *Deleted

## 2022-01-22 ENCOUNTER — Inpatient Hospital Stay: Payer: Medicare Other

## 2022-01-22 VITALS — BP 135/68 | HR 71 | Temp 98.0°F | Resp 20

## 2022-01-22 DIAGNOSIS — Z5111 Encounter for antineoplastic chemotherapy: Secondary | ICD-10-CM | POA: Diagnosis not present

## 2022-01-22 DIAGNOSIS — D696 Thrombocytopenia, unspecified: Secondary | ICD-10-CM

## 2022-01-22 LAB — PREPARE RBC (CROSSMATCH)

## 2022-01-22 MED ORDER — HEPARIN SOD (PORK) LOCK FLUSH 100 UNIT/ML IV SOLN
500.0000 [IU] | Freq: Every day | INTRAVENOUS | Status: AC | PRN
  Administered 2022-01-22: 500 [IU]

## 2022-01-22 MED ORDER — HEPARIN SOD (PORK) LOCK FLUSH 100 UNIT/ML IV SOLN: 250.0000 [IU] | INTRAVENOUS | Status: DC | PRN

## 2022-01-22 MED ORDER — SODIUM CHLORIDE 0.9% FLUSH
10.0000 mL | INTRAVENOUS | Status: AC | PRN
  Administered 2022-01-22: 10 mL

## 2022-01-22 MED ORDER — SODIUM CHLORIDE 0.9% IV SOLUTION
250.0000 mL | Freq: Once | INTRAVENOUS | Status: AC
  Administered 2022-01-22: 250 mL via INTRAVENOUS

## 2022-01-22 NOTE — Progress Notes (Signed)
Referral made to [facility]. 23 pages of records sent via fax and received a "successful" message.Pt's wife notified of referral made via fax. She said thank you.  ?

## 2022-01-22 NOTE — Patient Instructions (Signed)
Blood Transfusion, Adult, Care After This sheet gives you information about how to care for yourself after your procedure. Your doctor may also give you more specific instructions. If you have problems or questions, contact your doctor. What can I expect after the procedure? After the procedure, it is common to have: Bruising and soreness at the IV site. A headache. Follow these instructions at home: Insertion site care   Follow instructions from your doctor about how to take care of your insertion site. This is where an IV tube was put into your vein. Make sure you: Wash your hands with soap and water before and after you change your bandage (dressing). If you cannot use soap and water, use hand sanitizer. Change your bandage as told by your doctor. Check your insertion site every day for signs of infection. Check for: Redness, swelling, or pain. Bleeding from the site. Warmth. Pus or a bad smell. General instructions Take over-the-counter and prescription medicines only as told by your doctor. Rest as told by your doctor. Go back to your normal activities as told by your doctor. Keep all follow-up visits as told by your doctor. This is important. Contact a doctor if: You have itching or red, swollen areas of skin (hives). You feel worried or nervous (anxious). You feel weak after doing your normal activities. You have redness, swelling, warmth, or pain around the insertion site. You have blood coming from the insertion site, and the blood does not stop with pressure. You have pus or a bad smell coming from the insertion site. Get help right away if: You have signs of a serious reaction. This may be coming from an allergy or the body's defense system (immune system). Signs include: Trouble breathing or shortness of breath. Swelling of the face or feeling warm (flushed). Fever or chills. Head, chest, or back pain. Dark pee (urine) or blood in the pee. Widespread rash. Fast  heartbeat. Feeling dizzy or light-headed. You may receive your blood transfusion in an outpatient setting. If so, you will be told whom to contact to report any reactions. These symptoms may be an emergency. Do not wait to see if the symptoms will go away. Get medical help right away. Call your local emergency services (911 in the [place] Do not drive yourself to the hospital. Summary Bruising and soreness at the IV site are common. Check your insertion site every day for signs of infection. Rest as told by your doctor. Go back to your normal activities as told by your doctor. Get help right away if you have signs of a serious reaction. This information is not intended to replace advice given to you by your health care provider. Make sure you discuss any questions you have with your health care provider. Document Revised: 02/26/2021 Document Reviewed: 04/26/2019 Elsevier Patient Education  2022 [employer]  

## 2022-01-23 ENCOUNTER — Other Ambulatory Visit: Payer: Self-pay | Admitting: Oncology

## 2022-01-23 DIAGNOSIS — C250 Malignant neoplasm of head of pancreas: Secondary | ICD-10-CM

## 2022-01-24 ENCOUNTER — Other Ambulatory Visit: Payer: Self-pay | Admitting: Oncology

## 2022-01-24 LAB — TYPE AND SCREEN
ABO/RH(D): A POS
Antibody Screen: NEGATIVE
Unit division: 0
Unit division: 0

## 2022-01-24 LAB — BPAM RBC
Blood Product Expiration Date: 202304032359
Blood Product Expiration Date: 202304032359
ISSUE DATE / TIME: 202303100734
ISSUE DATE / TIME: 202303100734
Unit Type and Rh: 6200
Unit Type and Rh: 6200

## 2022-01-25 ENCOUNTER — Encounter: Payer: Self-pay | Admitting: Nurse Practitioner

## 2022-01-25 ENCOUNTER — Telehealth: Payer: Self-pay | Admitting: *Deleted

## 2022-01-25 ENCOUNTER — Encounter: Payer: Self-pay | Admitting: Oncology

## 2022-01-25 NOTE — Telephone Encounter (Signed)
Call to referral coordinator, [place] at [facility] to f/u on referral placed on Friday. She confirms it was received and will work on it when she is able. Requested a higher priority than routine due to worsening creatinine--now at 3.34 ?

## 2022-01-26 ENCOUNTER — Inpatient Hospital Stay (HOSPITAL_BASED_OUTPATIENT_CLINIC_OR_DEPARTMENT_OTHER): Payer: Medicare Other | Admitting: Nurse Practitioner

## 2022-01-26 ENCOUNTER — Other Ambulatory Visit: Payer: Self-pay

## 2022-01-26 ENCOUNTER — Inpatient Hospital Stay: Payer: Medicare Other

## 2022-01-26 ENCOUNTER — Encounter: Payer: Self-pay | Admitting: Nurse Practitioner

## 2022-01-26 VITALS — BP 110/65 | HR 82 | Temp 98.1°F | Resp 20 | Ht 69.0 in | Wt 119.4 lb

## 2022-01-26 DIAGNOSIS — C250 Malignant neoplasm of head of pancreas: Secondary | ICD-10-CM

## 2022-01-26 DIAGNOSIS — Z5111 Encounter for antineoplastic chemotherapy: Secondary | ICD-10-CM | POA: Diagnosis not present

## 2022-01-26 LAB — CMP (CANCER CENTER ONLY)
ALT: 31 U/L (ref 0–44)
AST: 19 U/L (ref 15–41)
Albumin: 3.6 g/dL (ref 3.5–5.0)
Alkaline Phosphatase: 339 U/L — ABNORMAL HIGH (ref 38–126)
Anion gap: 11 (ref 5–15)
BUN: 45 mg/dL — ABNORMAL HIGH (ref 8–23)
CO2: 11 mmol/L — ABNORMAL LOW (ref 22–32)
Calcium: 8.9 mg/dL (ref 8.9–10.3)
Chloride: 117 mmol/L — ABNORMAL HIGH (ref 98–111)
Creatinine: 3.23 mg/dL (ref 0.61–1.24)
GFR, Estimated: 19 mL/min — ABNORMAL LOW (ref >60)
Glucose, Bld: 125 mg/dL — ABNORMAL HIGH (ref 70–99)
Potassium: 3.8 mmol/L (ref 3.5–5.1)
Sodium: 139 mmol/L (ref 135–145)
Total Bilirubin: 0.9 mg/dL (ref 0.3–1.2)
Total Protein: 6.6 g/dL (ref 6.5–8.1)

## 2022-01-26 LAB — CBC WITH DIFFERENTIAL (CANCER CENTER ONLY)
Abs Immature Granulocytes: 0.1 10*3/uL — ABNORMAL HIGH (ref 0.00–0.07)
Basophils Absolute: 0.1 10*3/uL (ref 0.0–0.1)
Basophils Relative: 1 %
Eosinophils Absolute: 0.3 10*3/uL (ref 0.0–0.5)
Eosinophils Relative: 4 %
HCT: 29.6 % — ABNORMAL LOW (ref 39.0–52.0)
Hemoglobin: 9.4 g/dL — ABNORMAL LOW (ref 13.0–17.0)
Immature Granulocytes: 1 %
Lymphocytes Relative: 8 %
Lymphs Abs: 0.6 10*3/uL — ABNORMAL LOW (ref 0.7–4.0)
MCH: 30.8 pg (ref 26.0–34.0)
MCHC: 31.8 g/dL (ref 30.0–36.0)
MCV: 97 fL (ref 80.0–100.0)
Monocytes Absolute: 0.3 10*3/uL (ref 0.1–1.0)
Monocytes Relative: 4 %
Neutro Abs: 6.6 10*3/uL (ref 1.7–7.7)
Neutrophils Relative %: 82 %
Platelet Count: 68 10*3/uL — ABNORMAL LOW (ref 150–400)
RBC: 3.05 MIL/uL — ABNORMAL LOW (ref 4.22–5.81)
RDW: 17.3 % — ABNORMAL HIGH (ref 11.5–15.5)
WBC Count: 8 10*3/uL (ref 4.0–10.5)
nRBC: 0 % (ref 0.0–0.2)

## 2022-01-26 LAB — SAMPLE TO BLOOD BANK

## 2022-01-26 MED ORDER — SODIUM CHLORIDE 0.9 % IV SOLN
300.0000 mg/m2 | Freq: Once | INTRAVENOUS | Status: AC
  Administered 2022-01-26: 486 mg via INTRAVENOUS
  Filled 2022-01-26: qty 24.3

## 2022-01-26 MED ORDER — SODIUM CHLORIDE 0.9 % IV SOLN
40.0000 mg/m2 | Freq: Once | INTRAVENOUS | Status: AC
  Administered 2022-01-26: 64.5 mg via INTRAVENOUS
  Filled 2022-01-26: qty 15

## 2022-01-26 MED ORDER — SODIUM CHLORIDE 0.9 % IV SOLN
10.0000 mg | Freq: Once | INTRAVENOUS | Status: AC
  Administered 2022-01-26: 10 mg via INTRAVENOUS
  Filled 2022-01-26: qty 1

## 2022-01-26 MED ORDER — SODIUM CHLORIDE 0.9 % IV SOLN
1800.0000 mg/m2 | INTRAVENOUS | Status: DC
  Administered 2022-01-26: 2900 mg via INTRAVENOUS
  Filled 2022-01-26: qty 58

## 2022-01-26 MED ORDER — PALONOSETRON HCL INJECTION 0.25 MG/5ML
0.2500 mg | Freq: Once | INTRAVENOUS | Status: AC
  Administered 2022-01-26: 0.25 mg via INTRAVENOUS
  Filled 2022-01-26: qty 5

## 2022-01-26 MED ORDER — SODIUM CHLORIDE 0.9 % IV SOLN: Freq: Once | INTRAVENOUS | Status: AC

## 2022-01-26 NOTE — Progress Notes (Signed)
Creatinine critical value reported today of 3.23. reported to Aaron Card NP ?

## 2022-01-26 NOTE — Patient Instructions (Signed)
[facility]   ?Discharge Instructions: ?Thank you for choosing [facility] to provide your oncology and hematology care.  ? ?If you have a lab appointment with the [facility], please go directly to the [facility] and check in at the registration area. ?  ?Wear comfortable clothing and clothing appropriate for easy access to any Portacath or PICC line.  ? ?We strive to give you quality time with your provider. You may need to reschedule your appointment if you arrive late (15 or more minutes).  Arriving late affects you and other patients whose appointments are after yours.  Also, if you miss three or more appointments without notifying the office, you may be dismissed from the clinic at the provider?s discretion.    ?  ?For prescription refill requests, have your pharmacy contact our office and allow 72 hours for refills to be completed.   ? ?Today you received the following chemotherapy and/or immunotherapy agents Irinotecan Liposome (ONIVYDE), Leucovorin & Flourouracil (ADRUCIL).    ?  ?To help prevent nausea and vomiting after your treatment, we encourage you to take your nausea medication as directed. ? ?BELOW ARE SYMPTOMS THAT SHOULD BE REPORTED IMMEDIATELY: ?*FEVER GREATER THAN 100.4 F (38 ?C) OR HIGHER ?*CHILLS OR SWEATING ?*NAUSEA AND VOMITING THAT IS NOT CONTROLLED WITH YOUR NAUSEA MEDICATION ?*UNUSUAL SHORTNESS OF BREATH ?*UNUSUAL BRUISING OR BLEEDING ?*URINARY PROBLEMS (pain or burning when urinating, or frequent urination) ?*BOWEL PROBLEMS (unusual diarrhea, constipation, pain near the anus) ?TENDERNESS IN MOUTH AND THROAT WITH OR WITHOUT PRESENCE OF ULCERS (sore throat, sores in mouth, or a toothache) ?UNUSUAL RASH, SWELLING OR PAIN  ?UNUSUAL VAGINAL DISCHARGE OR ITCHING  ? ?Items with * indicate a potential emergency and should be followed up as soon as possible or go to the Emergency Department if any problems should occur. ? ?Please show the  CHEMOTHERAPY ALERT CARD or IMMUNOTHERAPY ALERT CARD at check-in to the Emergency Department and triage nurse. ? ?Should you have questions after your visit or need to cancel or reschedule your appointment, please contact [facility]  Dept: [phone]  and follow the prompts.  Office hours are 8:00 a.m. to 4:30 p.m. Monday - Friday. Please note that voicemails left after 4:00 p.m. may not be returned until the following business day.  We are closed weekends and major holidays. You have access to a nurse at all times for urgent questions. Please call the main number to the clinic Dept: [phone] and follow the prompts. ? ? ?For any non-urgent questions, you may also contact your provider using MyChart. We now offer e-Visits for anyone 43 and older to request care online for non-urgent symptoms. For details visit [email or web address]. ?  ?Also download the MyChart app! Go to the app store, search "MyChart", open the app, select [facility], and log in with your MyChart username and password. ? ?Due to Covid, a mask is required upon entering the hospital/clinic. If you do not have a mask, one will be given to you upon arrival. For doctor visits, patients may have 1 support person aged 11 or older with them. For treatment visits, patients cannot have anyone with them due to current Covid guidelines and our immunocompromised population.  ? ?Irinotecan Liposomal injection ?What is this medication? ?IRINOTECAN LIPOSOME (eye ri noe TEE kan LIP oh som) is a chemotherapy drug. It is used to treat pancreatic cancer. ?This medicine may be used for other purposes; ask your health care provider or  pharmacist if you have questions. ?COMMON BRAND NAME(S): ONIVYDE ?What should I tell my care team before I take this medication? ?They need to know if you have any of these conditions: ?bleeding disorders ?dehydration ?history of blood diseases, like sickle cell anemia or leukemia ?history of low levels  of calcium, magnesium, or potassium in the blood ?infection (especially a virus infection such as chickenpox, cold sores, or herpes) ?liver disease ?low blood counts, like low white cell, platelet, or red cell counts ?lung or breathing disease, like asthma ?recent or ongoing radiation therapy ?an unusual or allergic reaction to irinotecan liposome, other medicines, foods, dyes, or preservatives ?pregnant or trying to get pregnant ?breast-feeding ?How should I use this medication? ?This drug is given as an infusion into a vein. It is administered in a hospital or clinic by a specially trained health care professional. ?Talk to your pediatrician regarding the use of this medicine in children. Special care may be needed. ?Overdosage: If you think you have taken too much of this medicine contact a poison control center or emergency room at once. ?NOTE: This medicine is only for you. Do not share this medicine with others. ?What if I miss a dose? ?It is important not to miss your dose. Call your doctor or health care professional if you are unable to keep an appointment. ?What may interact with this medication? ?This medicine may interact with the following medications: ?antiviral medicines for HIV or AIDS ?certain medications for fungal infections like ketoconazole, itraconazole, voriconazole ?certain medications for seizures like carbamazepine, fosphenytoin, phenytoin ?clarithromycin ?gemfibrozil ?mephobarbital ?nefazodone ?phenobarbital ?primidone ?rifabutin ?rifampin ?rifapentine ?St. John's Wort ?telaprevir ?This list may not describe all possible interactions. Give your health care provider a list of all the medicines, herbs, non-prescription drugs, or dietary supplements you use. Also tell them if you smoke, drink alcohol, or use illegal drugs. Some items may interact with your medicine. ?What should I watch for while using this medication? ?Check with your doctor or health care professional if you get an attack  of severe diarrhea, nausea and vomiting, or if you sweat a lot. The loss of too much body fluid can make it dangerous for you to take this medicine. ?This medicine may interfere with the ability to have a child. You should talk with your doctor or health care professional if you are concerned about your fertility. ?Do not become pregnant while taking this medicine or for 1 month after the last dose; males with male partners should use condoms during treatment and for 4 months after the last dose. Women should inform their doctor if they wish to become pregnant or think they might be pregnant. There is a potential for serious side effects to an unborn child. Talk to your health care professional or pharmacist for more information. Do not breast-feed an infant while taking this medicine or for 1 month after the last dose. ?Avoid taking products that contain aspirin, acetaminophen, ibuprofen, naproxen, or ketoprofen unless instructed by your doctor. These medicines may hide a fever. ?Be careful brushing and flossing your teeth or using a toothpick because you may get an infection or bleed more easily. If you have any dental work done, tell your dentist you are receiving this medicine. ?Call your doctor or health care professional for advice if you get a fever, chills or sore throat, or other symptoms of a cold or flu. Do not treat yourself. This drug decreases your body's ability to fight infections. Try to avoid being around people who are  sick. ?This medicine may increase your risk to bruise or bleed. Call your doctor or health care professional if you notice any unusual bleeding. ?This drug may make you feel generally unwell. This is not uncommon, as chemotherapy can affect healthy cells as well as cancer cells. Report any side effects. Continue your course of treatment even though you feel ill unless your doctor tells you to stop. ?What side effects may I notice from receiving this medication? ?Side effects that  you should report to your doctor or health care professional as soon as possible: ?allergic reactions like skin rash, itching or hives, swelling of the face, lips, or tongue ?breathing problems ?cough ?diarrhea

## 2022-01-26 NOTE — Progress Notes (Signed)
?[facility] ?OFFICE PROGRESS NOTE ? ? ?Diagnosis: Pancreas cancer ? ?INTERVAL HISTORY:  ? ?Aaron Johnston returns as scheduled.  He completed cycle three 5-FU/liposomal irinotecan 01/12/2022.  No significant nausea with chemotherapy.  He has recently noticed nausea/vomiting when he first gets up in the morning.  This improves during the course of the day.  No mouth sores.  No change in baseline bowel habits.  No abdominal pain. ? ?Objective: ? ?Vital signs in last 24 hours: ? ?Blood pressure 110/65, pulse 82, temperature 98.1 ?F (36.7 ?C), temperature source Oral, resp. rate 20, height '5\' 9"'  (1.753 m), weight 119 lb 6.4 oz (54.2 kg), SpO2 100 %. ?  ? ?HEENT: No thrush or ulcers. ?Resp: Lungs clear bilaterally. ?Cardio: Regular rate and rhythm. ?GI: Abdomen soft and nontender.  No hepatomegaly. ?Vascular: No leg edema. ?Skin: Palms with skin thickening, hyperpigmentation. ?Port-A-Cath without erythema. ? ? ?Lab Results: ? ?Lab Results  ?Component Value Date  ? WBC 8.0 01/26/2022  ? HGB 9.4 (L) 01/26/2022  ? HCT 29.6 (L) 01/26/2022  ? MCV 97.0 01/26/2022  ? PLT 68 (L) 01/26/2022  ? NEUTROABS 6.6 01/26/2022  ? ? ?Imaging: ? ?No results found. ? ?Medications: I have reviewed the patient's current medications. ? ?Assessment/Plan: ?Pancreas cancer ?CT urology center 03/04/2020-haziness of the fat adjacent to the celiac axis and SMA with focal narrowing of the SMV, splenic vein, and splenoportal confluence ?CT 07/07/2020-infiltrative retroperitoneal mass with encasement of the celiac axis and SMA, high-grade narrowing of the proximal portal vein with cavernous transformation, nonocclusive thrombus within the main portal vein, mild biliary ductal dilatation, diffuse hypodense/hypoenhancing areas within the liver (edema versus infiltrating neoplasm), 2 x 1.7 cm area of focal prominence in the pancreas ?EUS 07/10/2020-no pancreas mass identified, 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, 1.5 cm aortocaval  node biopsy-scant lymphoid material, no malignancy ?Diagnostic laparoscopy 07/17/2020-segment 3 and 4 liver nodules, adenocarcinoma, positive for pankeratin, CK7 and CK20, partially positive for TTF-1, negative for CDX2 ?Foundation 1-microsatellite stable, tumor mutation burden 4, K-ras G12D, subclonal RB1 alteration ?Elevated CA 19-9 ?Cycle 1 FOLFOX 07/28/2020 ?Cycle 2 FOLFOX 08/18/2020, oxaliplatin dose reduced due to thrombocytopenia ?Cycle 3 FOLFOX 09/08/2020 ?Cycle 4 FOLFOX 09/29/2020  ?Cycle 5 FOLFOX 10/20/2020 ?CTs 11/06/2020-grossly stable infiltrative mass within the pancreatic head and porta hepatis.  New small low-density liver lesions.  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 ?Cycle 6 FOLFOX 11/10/2021  ?Cycle 7 FOLFOX 12/01/2020 ?MRI liver 12/04/2020-no significant change in posttreatment appearance of the pancreatic head.  Numerous intrinsically low signal hypoenhancing lesions of the liver parenchyma predominantly observed in the right lobe of the liver and liver dome, measuring no greater than 8 mm and as seen on recent prior CT of the abdomen/pelvis. ?Cycle 8 FOLFOX 12/22/2020 ?Cycle 9 FOLFOX 01/12/2021 ?Cycle 10 FOLFOX 02/02/2021 ?MRI liver 02/19/2021-no change in pancreas head mass, multiple hypoenhancing liver lesions are new and increased in size, effacement of portal vein by pancreas head mass with cavernous transformation, no change in mild splenomegaly ?Cycle 1 gemcitabine/Abraxane 03/12/2021 ?Cycle 2 gemcitabine/Abraxane 03/27/2021- dose reductions secondary to neutropenia ?Cycle 3 gemcitabine/Abraxane 04/17/2021-dose reductions due to thrombocytopenia, white cell growth factor support added ?Cycle 4 gemcitabine/Abraxane 05/01/2021, Ziextenzo ?Cycle 5 gemcitabine/Abraxane 05/15/2021, Ziextenzo ?MRI abdomen 05/26/2021-mild enlargement of dominant hepatic metastases, appearance of  infiltrative pancreas mass extending to the celiac trunk and SMA, splenic vein occlusion, cavernous transformation of the portal  vein with upper abdomen collaterals, splenomegaly, mild upper abdominal ascites and mesenteric edema, minimal subpleural nodularity left lower lobe ?Cycle 6 gemcitabine/Abraxane 05/28/2021 ?Cycle 7 gemcitabine/Abraxane 06/12/2021 ?Cycle 8 gemcitabine/Abraxane 06/26/2021 ?Cycle 9 gemcitabine/Abraxane 07/10/2021 ?Cycle 10 gemcitabine/Abraxane 07/23/2021 ?Cycle 11 gemcitabine/Abraxane 08/06/2021 ?MRI abdomen 08/20/2021-multifocal nodularity in the lower chest with a new discrete right lower lobe nodule, enlarging subsegment 7/8 liver lesion, other small lesions appear stable, gastric varices, review of images with radiology-enlargement of at least 2 liver lesions, suspicious nodule n the right lower lung ?Cycle 1  5-FU/liposomal irinotecan 09/08/2021 ?Cycle 2  5-FU/liposomal irinotecan 09/22/2021 ?Cycle 3  5-FU/liposomal irinotecan 10/20/2021 ?Cycle 4 5-FU/liposomal irinotecan 11/04/2021 ?Cycle 5 5-FU/liposomal irinotecan 11/17/2021 ?MRI abdomen 11/27/2021-generally unchanged appearance of the pancreas with a very ill-defined hypoenhancing mass centered within the pancreatic neck, again appears to encase the celiac axis and encasing occlude the superior mesenteric vein and splenic vein; interval enlargement of multiple liver lesions; numerous lung nodules in the bilateral lung bases; unchanged splenomegaly; multiple new heterogeneously and hypoenhancing subcapsular lesions of the spleen most consistent with infarctions; small volume ascites throughout the abdomen; distended, debris-filled stomach suggesting some degree of gastric outlet and/or duodenal obstruction. ?Cycle 6 5-FU/liposomal irinotecan 12/01/2021 ?Cycle 7 5-FU/liposomal irinotecan 12/14/2021 ?Cycle 8 5-FU/liposomal irinotecan 01/12/2022 ?Cycle 9 5-FU/liposomal irinotecan 01/26/2022 ?  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   Admission with recurrent GI bleeding and severe anemia 09/15/2021 ?Upper endoscopy 09/17/2021-esophageal varices with stigmata of recent bleeding, banded.  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  Influenza A 10/13/2021 ?11.  Renal insufficiency February 2023-renal ultrasound 01/07/2022-increase renal cortical echogenicity bilaterally, mild bilateral renal pelvic fullness; referred to renal ?  ?  ? ?Disposition: Aaron Johnston appears unchanged.  He continues every 2-week 5-FU/liposomal irinotecan.  We again discussed supportive care versus continuing chemotherapy.  He would like to continue chemotherapy.  Plan to proceed with treatment today as scheduled. ? ?We reviewed the CBC and chemistry panel from today.  Labs adequate to proceed with treatment.  Platelet count returned at 68,000.  Plan to continue weekly Nplate, next injection due 01/28/2022.  He understands to contact the office with bleeding.  Creatinine stable.  Renal consult is pending. ? ?He will return for lab and Nplate 02/04/2022.  We will see him in follow-up in 2 weeks.  He will contact the office in the interim as on above or with any other problems. ? ?Plan reviewed with  Dr. Benay Spice. ? ? ? ?[name] ANP/GNP-BC  ? ?01/26/2022  ?8:47 AM ? ? ? ? ? ? ? ?

## 2022-01-26 NOTE — Progress Notes (Signed)
Patient presents for treatment. RN assessment completed along with the following: ? ?Labs/vitals reviewed - Yes, and per Lattie Haw, NP: Okay to proceed with Plt of 68 . Okay to treat patient today with current abnormal Labs per Lattie Haw, NP. ?Weight within 10% of previous measurement - Yes ?Informed consent completed and reflects current therapy/intent - Yes, on date 09/08/2021             ?Provider progress note reviewed - Yes, today's provider note was reviewed. ?Treatment/Antibody/Supportive plan reviewed - Yes, and there are no adjustments needed for today's treatment. ?S&H and other orders reviewed - Yes, and there are no additional orders identified. ?Previous treatment date reviewed - Yes, and the appropriate amount of time has elapsed between treatments. ?Clinic Hand Off Received from - Yes from [place], NP. ? ?Patient to proceed with treatment.  ? ?

## 2022-01-28 ENCOUNTER — Encounter: Payer: Self-pay | Admitting: Nurse Practitioner

## 2022-01-28 ENCOUNTER — Inpatient Hospital Stay: Payer: Medicare Other

## 2022-01-28 ENCOUNTER — Other Ambulatory Visit: Payer: Self-pay

## 2022-01-28 VITALS — BP 103/60 | HR 75 | Temp 97.6°F | Resp 18

## 2022-01-28 DIAGNOSIS — Z5111 Encounter for antineoplastic chemotherapy: Secondary | ICD-10-CM | POA: Diagnosis not present

## 2022-01-28 MED ORDER — HEPARIN SOD (PORK) LOCK FLUSH 100 UNIT/ML IV SOLN
500.0000 [IU] | Freq: Once | INTRAVENOUS | Status: AC | PRN
  Administered 2022-01-28: 500 [IU]

## 2022-01-28 MED ORDER — SODIUM CHLORIDE 0.9% FLUSH
10.0000 mL | INTRAVENOUS | Status: DC | PRN
  Administered 2022-01-28: 10 mL

## 2022-01-28 MED ORDER — ROMIPLOSTIM 250 MCG ~~LOC~~ SOLR
3.0000 ug/kg | Freq: Once | SUBCUTANEOUS | Status: AC
  Administered 2022-01-28: 165 ug via SUBCUTANEOUS
  Filled 2022-01-28: qty 0.33

## 2022-01-28 MED ORDER — PEGFILGRASTIM-BMEZ 6 MG/0.6ML ~~LOC~~ SOSY
6.0000 mg | PREFILLED_SYRINGE | Freq: Once | SUBCUTANEOUS | Status: AC
  Administered 2022-01-28: 6 mg via SUBCUTANEOUS
  Filled 2022-01-28: qty 0.6

## 2022-01-28 NOTE — Patient Instructions (Signed)
Pegfilgrastim Injection ?What is this medication? ?PEGFILGRASTIM (PEG fil gra stim) lowers the risk of infection in people who are receiving chemotherapy. It works by helping your body make more white blood cells, which protects your body from infection. It may also be used to help people who have been exposed to high doses of radiation. ?This medicine may be used for other purposes; ask your health care provider or pharmacist if you have questions. ?COMMON BRAND NAME(S): Fulphila, Neulasta, Nyvepria, UDENYCA, Ziextenzo ?What should I tell my care team before I take this medication? ?They need to know if you have any of these conditions: ?Kidney disease ?Latex allergy ?Ongoing radiation therapy ?Sickle cell disease ?Skin reactions to acrylic adhesives (On-Body Injector only) ?An unusual or allergic reaction to pegfilgrastim, filgrastim, other medications, foods, dyes, or preservatives ?Pregnant or trying to get pregnant ?Breast-feeding ?How should I use this medication? ?This medication is for injection under the skin. If you get this medication at home, you will be taught how to prepare and give the pre-filled syringe or how to use the On-body Injector. Refer to the patient Instructions for Use for detailed instructions. Use exactly as directed. Tell your care team immediately if you suspect that the On-body Injector may not have performed as intended or if you suspect the use of the On-body Injector resulted in a missed or partial dose. ?It is important that you put your used needles and syringes in a special sharps container. Do not put them in a trash can. If you do not have a sharps container, call your pharmacist or care team to get one. ?Talk to your care team about the use of this medication in children. While this medication may be prescribed for selected conditions, precautions do apply. ?Overdosage: If you think you have taken too much of this medicine contact a poison control center or emergency room at  once. ?NOTE: This medicine is only for you. Do not share this medicine with others. ?What if I miss a dose? ?It is important not to miss your dose. Call your care team if you miss your dose. If you miss a dose due to an On-body Injector failure or leakage, a new dose should be administered as soon as possible using a single prefilled syringe for manual use. ?What may interact with this medication? ?Interactions have not been studied. ?This list may not describe all possible interactions. Give your health care provider a list of all the medicines, herbs, non-prescription drugs, or dietary supplements you use. Also tell them if you smoke, drink alcohol, or use illegal drugs. Some items may interact with your medicine. ?What should I watch for while using this medication? ?Your condition will be monitored carefully while you are receiving this medication. ?You may need blood work done while you are taking this medication. ?Talk to your care team about your risk of cancer. You may be more at risk for certain types of cancer if you take this medication. ?If you are going to need a MRI, CT scan, or other procedure, tell your care team that you are using this medication (On-Body Injector only). ?What side effects may I notice from receiving this medication? ?Side effects that you should report to your care team as soon as possible: ?Allergic reactions--skin rash, itching, hives, swelling of the face, lips, tongue, or throat ?Capillary leak syndrome--stomach or muscle pain, unusual weakness or fatigue, feeling faint or lightheaded, decrease in the amount of urine, swelling of the ankles, hands, or feet, trouble breathing ?High   white blood cell level--fever, fatigue, trouble breathing, night sweats, change in vision, weight loss ?Inflammation of the aorta--fever, fatigue, back, chest, or stomach pain, severe headache ?Kidney injury (glomerulonephritis)--decrease in the amount of urine, red or dark brown urine, foamy or  bubbly urine, swelling of the ankles, hands, or feet ?Shortness of breath or trouble breathing ?Spleen injury--pain in upper left stomach or shoulder ?Unusual bruising or bleeding ?Side effects that usually do not require medical attention (report to your care team if they continue or are bothersome): ?Bone pain ?Pain in the hands or feet ?This list may not describe all possible side effects. Call your doctor for medical advice about side effects. You may report side effects to FDA at 1-800-FDA-1088. ?Where should I keep my medication? ?Keep out of the reach of children. ?If you are using this medication at home, you will be instructed on how to store it. Throw away any unused medication after the expiration date on the label. ?NOTE: This sheet is a summary. It may not cover all possible information. If you have questions about this medicine, talk to your doctor, pharmacist, or health care provider. ?? 2022 Elsevier/Gold Standard (2021-07-21 00:00:00) ? ?Romiplostim injection ?What is this medication? ?ROMIPLOSTIM (roe mi PLOE stim) helps your body make more platelets. This medicine is used to treat low platelets caused by chronic idiopathic thrombocytopenic purpura (ITP) or a bone marrow syndrome caused by radiation sickness. ?This medicine may be used for other purposes; ask your health care provider or pharmacist if you have questions. ?COMMON BRAND NAME(S): Nplate ?What should I tell my care team before I take this medication? ?They need to know if you have any of these conditions: ?blood clots ?myelodysplastic syndrome ?an unusual or allergic reaction to romiplostim, mannitol, other medicines, foods, dyes, or preservatives ?pregnant or trying to get pregnant ?breast-feeding ?How should I use this medication? ?This medicine is injected under the skin. It is given by a health care provider in a hospital or clinic setting. ?A special MedGuide will be given to you before each treatment. Be sure to read this  information carefully each time. ?Talk to your health care provider about the use of this medicine in children. While it may be prescribed for children as young as newborns for selected conditions, precautions do apply. ?Overdosage: If you think you have taken too much of this medicine contact a poison control center or emergency room at once. ?NOTE: This medicine is only for you. Do not share this medicine with others. ?What if I miss a dose? ?Keep appointments for follow-up doses. It is important not to miss your dose. Call your health care provider if you are unable to keep an appointment. ?What may interact with this medication? ?Interactions are not expected. ?This list may not describe all possible interactions. Give your health care provider a list of all the medicines, herbs, non-prescription drugs, or dietary supplements you use. Also tell them if you smoke, drink alcohol, or use illegal drugs. Some items may interact with your medicine. ?What should I watch for while using this medication? ?Visit your health care provider for regular checks on your progress. You may need blood work done while you are taking this medicine. Your condition will be monitored carefully while you are receiving this medicine. It is important not to miss any appointments. ?What side effects may I notice from receiving this medication? ?Side effects that you should report to your doctor or health care professional as soon as possible: ?allergic reactions (skin rash,  itching or hives; swelling of the face, lips, or tongue) ?bleeding (bloody or black, tarry stools; red or dark brown urine; spitting up blood or brown material that looks like coffee grounds; red spots on the skin; unusual bruising or bleeding from the eyes, gums, or nose) ?blood clot (chest pain; shortness of breath; pain, swelling, or warmth in the leg) ?stroke (changes in vision; confusion; trouble speaking or understanding; severe headaches; sudden numbness or  weakness of the face, arm or leg; trouble walking; dizziness; loss of balance or coordination) ?Side effects that usually do not require medical attention (report to your doctor or health care professional if they con

## 2022-01-29 ENCOUNTER — Telehealth: Payer: Self-pay | Admitting: *Deleted

## 2022-01-29 NOTE — Telephone Encounter (Signed)
Left VM with referral coordinator, Shonnell requesting update on status of referral. Patient needs to be seen soon for renal insufficiency. Left this RN's direct number to call. ?

## 2022-02-01 LAB — CANCER ANTIGEN 19-9: CA 19-9: 7854 U/mL — ABNORMAL HIGH (ref 0–35)

## 2022-02-04 ENCOUNTER — Encounter: Payer: Self-pay | Admitting: *Deleted

## 2022-02-04 ENCOUNTER — Inpatient Hospital Stay: Payer: Medicare Other

## 2022-02-04 ENCOUNTER — Ambulatory Visit: Payer: Medicare Other

## 2022-02-04 ENCOUNTER — Other Ambulatory Visit: Payer: Self-pay

## 2022-02-04 VITALS — BP 112/65 | HR 90 | Temp 97.6°F | Resp 20

## 2022-02-04 DIAGNOSIS — C250 Malignant neoplasm of head of pancreas: Secondary | ICD-10-CM

## 2022-02-04 DIAGNOSIS — D696 Thrombocytopenia, unspecified: Secondary | ICD-10-CM

## 2022-02-04 DIAGNOSIS — Z5111 Encounter for antineoplastic chemotherapy: Secondary | ICD-10-CM | POA: Diagnosis not present

## 2022-02-04 DIAGNOSIS — Z95828 Presence of other vascular implants and grafts: Secondary | ICD-10-CM

## 2022-02-04 LAB — BASIC METABOLIC PANEL - CANCER CENTER ONLY
Anion gap: 11 (ref 5–15)
BUN: 59 mg/dL — ABNORMAL HIGH (ref 8–23)
CO2: 9 mmol/L — ABNORMAL LOW (ref 22–32)
Calcium: 8.6 mg/dL — ABNORMAL LOW (ref 8.9–10.3)
Chloride: 118 mmol/L — ABNORMAL HIGH (ref 98–111)
Creatinine: 3.22 mg/dL (ref 0.61–1.24)
GFR, Estimated: 20 mL/min — ABNORMAL LOW (ref >60)
Glucose, Bld: 159 mg/dL — ABNORMAL HIGH (ref 70–99)
Potassium: 3.9 mmol/L (ref 3.5–5.1)
Sodium: 138 mmol/L (ref 135–145)

## 2022-02-04 LAB — CBC WITH DIFFERENTIAL (CANCER CENTER ONLY)
Abs Immature Granulocytes: 0.1 10*3/uL — ABNORMAL HIGH (ref 0.00–0.07)
Band Neutrophils: 11 %
Basophils Absolute: 0.1 10*3/uL (ref 0.0–0.1)
Basophils Relative: 2 %
Eosinophils Absolute: 0.1 10*3/uL (ref 0.0–0.5)
Eosinophils Relative: 5 %
HCT: 26.5 % — ABNORMAL LOW (ref 39.0–52.0)
Hemoglobin: 8.5 g/dL — ABNORMAL LOW (ref 13.0–17.0)
Lymphocytes Relative: 9 %
Lymphs Abs: 0.2 10*3/uL — ABNORMAL LOW (ref 0.7–4.0)
MCH: 30.9 pg (ref 26.0–34.0)
MCHC: 32.1 g/dL (ref 30.0–36.0)
MCV: 96.4 fL (ref 80.0–100.0)
Metamyelocytes Relative: 2 %
Monocytes Absolute: 0 10*3/uL — ABNORMAL LOW (ref 0.1–1.0)
Monocytes Relative: 1 %
Myelocytes: 1 %
Neutro Abs: 2 10*3/uL (ref 1.7–7.7)
Neutrophils Relative %: 69 %
Platelet Count: 67 10*3/uL — ABNORMAL LOW (ref 150–400)
RBC: 2.75 MIL/uL — ABNORMAL LOW (ref 4.22–5.81)
RDW: 16.8 % — ABNORMAL HIGH (ref 11.5–15.5)
WBC Count: 2.5 10*3/uL — ABNORMAL LOW (ref 4.0–10.5)
nRBC: 0 % (ref 0.0–0.2)

## 2022-02-04 LAB — SAMPLE TO BLOOD BANK

## 2022-02-04 MED ORDER — SODIUM CHLORIDE 0.9% FLUSH
10.0000 mL | INTRAVENOUS | Status: DC | PRN
  Administered 2022-02-04: 10 mL

## 2022-02-04 MED ORDER — HEPARIN SOD (PORK) LOCK FLUSH 100 UNIT/ML IV SOLN
500.0000 [IU] | Freq: Once | INTRAVENOUS | Status: AC | PRN
  Administered 2022-02-04: 500 [IU]

## 2022-02-04 MED ORDER — ROMIPLOSTIM 125 MCG ~~LOC~~ SOLR
2.0000 ug/kg | Freq: Once | SUBCUTANEOUS | Status: AC
  Administered 2022-02-04: 110 ug via SUBCUTANEOUS
  Filled 2022-02-04: qty 0.22

## 2022-02-04 NOTE — Progress Notes (Signed)
CRITICAL VALUE STICKER ? ?CRITICAL VALUE: Creatinine 3.22 ? ?RECEIVER (on-site recipient of call):Dajour Pierpoint,RN ? ?DATE & TIME NOTIFIED: 02/04/22 @ 1036 ? ?MESSENGER (representative from lab):Simona Huh ? ?MD NOTIFIED: [name], NP ? ?TIME OF NOTIFICATION:1040 ? ?RESPONSE:  No new orders--stable. Seeing renal on 02/08/22 ?

## 2022-02-04 NOTE — Patient Instructions (Addendum)
Romiplostim injection ?What is this medication? ?ROMIPLOSTIM (roe mi PLOE stim) helps your body make more platelets. This medicine is used to treat low platelets caused by chronic idiopathic thrombocytopenic purpura (ITP) or a bone marrow syndrome caused by radiation sickness. ?This medicine may be used for other purposes; ask your health care provider or pharmacist if you have questions. ?COMMON BRAND NAME(S): Nplate ?What should I tell my care team before I take this medication? ?They need to know if you have any of these conditions: ?blood clots ?myelodysplastic syndrome ?an unusual or allergic reaction to romiplostim, mannitol, other medicines, foods, dyes, or preservatives ?pregnant or trying to get pregnant ?breast-feeding ?How should I use this medication? ?This medicine is injected under the skin. It is given by a health care provider in a hospital or clinic setting. ?A special MedGuide will be given to you before each treatment. Be sure to read this information carefully each time. ?Talk to your health care provider about the use of this medicine in children. While it may be prescribed for children as young as newborns for selected conditions, precautions do apply. ?Overdosage: If you think you have taken too much of this medicine contact a poison control center or emergency room at once. ?NOTE: This medicine is only for you. Do not share this medicine with others. ?What if I miss a dose? ?Keep appointments for follow-up doses. It is important not to miss your dose. Call your health care provider if you are unable to keep an appointment. ?What may interact with this medication? ?Interactions are not expected. ?This list may not describe all possible interactions. Give your health care provider a list of all the medicines, herbs, non-prescription drugs, or dietary supplements you use. Also tell them if you smoke, drink alcohol, or use illegal drugs. Some items may interact with your medicine. ?What should I  watch for while using this medication? ?Visit your health care provider for regular checks on your progress. You may need blood work done while you are taking this medicine. Your condition will be monitored carefully while you are receiving this medicine. It is important not to miss any appointments. ?What side effects may I notice from receiving this medication? ?Side effects that you should report to your doctor or health care professional as soon as possible: ?allergic reactions (skin rash, itching or hives; swelling of the face, lips, or tongue) ?bleeding (bloody or black, tarry stools; red or dark brown urine; spitting up blood or brown material that looks like coffee grounds; red spots on the skin; unusual bruising or bleeding from the eyes, gums, or nose) ?blood clot (chest pain; shortness of breath; pain, swelling, or warmth in the leg) ?stroke (changes in vision; confusion; trouble speaking or understanding; severe headaches; sudden numbness or weakness of the face, arm or leg; trouble walking; dizziness; loss of balance or coordination) ?Side effects that usually do not require medical attention (report to your doctor or health care professional if they continue or are bothersome): ?diarrhea ?dizziness ?headache ?joint pain ?muscle pain ?stomach pain ?trouble sleeping ?This list may not describe all possible side effects. Call your doctor for medical advice about side effects. You may report side effects to FDA at 1-800-FDA-1088. ?Where should I keep my medication? ?This medicine is given in a hospital or clinic. It will not be stored at home. ?NOTE: This sheet is a summary. It may not cover all possible information. If you have questions about this medicine, talk to your doctor, pharmacist, or health care provider. ??   2022 Elsevier/Gold Standard (2021-07-21 00:00:00) ? ? ?Implanted [facility] Guide ?An implanted port is a device that is placed under the skin. It is usually placed in the chest. The device  may vary based on the need. Implanted ports can be used to give IV medicine, to take blood, or to give fluids. You may have an implanted port if: ?You need IV medicine that would be irritating to the small veins in your hands or arms. ?You need IV medicines, such as chemotherapy, for a long period of time. ?You need IV nutrition for a long period of time. ?You may have fewer limitations when using a port than you would if you used other types of long-term IVs. You will also likely be able to return to normal activities after your incision heals. ?An implanted port has two main parts: ?Reservoir. The reservoir is the part where a needle is inserted to give medicines or draw blood. The reservoir is round. After the port is placed, it appears as a small, raised area under your skin. ?Catheter. The catheter is a small, thin tube that connects the reservoir to a vein. Medicine that is inserted into the reservoir goes into the catheter and then into the vein. ?How is my port accessed? ?To access your port: ?A numbing cream may be placed on the skin over the port site. ?Your health care provider will put on a mask and sterile gloves. ?The skin over your port will be cleaned carefully with a germ-killing soap and allowed to dry. ?Your health care provider will gently pinch the port and insert a needle into it. ?Your health care provider will check for a blood return to make sure the port is in the vein and is still working (patent). ?If your port needs to remain accessed to get medicine continuously (constant infusion), your health care provider will place a clear bandage (dressing) over the needle site. The dressing and needle will need to be changed every week, or as told by your health care provider. ?What is flushing? ?Flushing helps keep the port working. Follow instructions from your health care provider about how and when to flush the port. Ports are usually flushed with saline solution or a medicine called heparin.  The need for flushing will depend on how the port is used: ?If the port is only used from time to time to give medicines or draw blood, the port may need to be flushed: ?Before and after medicines have been given. ?Before and after blood has been drawn. ?As part of routine maintenance. Flushing may be recommended every 4-6 weeks. ?If a constant infusion is running, the port may not need to be flushed. ?Throw away any syringes in a disposal container that is meant for sharp items (sharps container). You can buy a sharps container from a pharmacy, or you can make one by using an empty hard plastic bottle with a cover. ?How long will my port stay implanted? ?The port can stay in for as long as your health care provider thinks it is needed. When it is time for the port to come out, a surgery will be done to remove it. The surgery will be similar to the procedure that was done to put the port in. ?Follow these instructions at home: ?Caring for your port and port site ?Flush your port as told by your health care provider. ?If you need an infusion over several days, follow instructions from your health care provider about how to take care of  your port site. Make sure you: ?Change your dressing as told by your health care provider. ?Wash your hands with soap and water for at least 20 seconds before and after you change your dressing. If soap and water are not available, use alcohol-based hand sanitizer. ?Place any used dressings or infusion bags into a plastic bag. Throw that bag in the trash. ?Keep the dressing that covers the needle clean and dry. Do not get it wet. ?Do not use scissors or sharp objects near the infusion tubing. ?Keep any external tubes clamped, unless they are being used. ?Check your port site every day for signs of infection. Check for: ?Redness, swelling, or pain. ?Fluid or blood. ?Warmth. ?Pus or a bad smell. ?Protect the skin around the port site. ?Avoid wearing bra straps that rub or irritate the  site. ?Protect the skin around your port from seat belts. Place a soft pad over your chest if needed. ?Bathe or shower as told by your health care provider. The site may get wet as long as you are not ac

## 2022-02-06 ENCOUNTER — Emergency Department (HOSPITAL_BASED_OUTPATIENT_CLINIC_OR_DEPARTMENT_OTHER): Payer: Medicare Other

## 2022-02-06 ENCOUNTER — Emergency Department (HOSPITAL_BASED_OUTPATIENT_CLINIC_OR_DEPARTMENT_OTHER)
Admission: EM | Admit: 2022-02-06 | Discharge: 2022-02-06 | Disposition: A | Discharge status: Home / Self Care | Payer: Medicare Other | Source: Home / Self Care | Attending: Emergency Medicine | Admitting: Emergency Medicine

## 2022-02-06 ENCOUNTER — Other Ambulatory Visit: Payer: Self-pay

## 2022-02-06 ENCOUNTER — Encounter (HOSPITAL_BASED_OUTPATIENT_CLINIC_OR_DEPARTMENT_OTHER): Payer: Self-pay | Admitting: *Deleted

## 2022-02-06 DIAGNOSIS — Z8507 Personal history of malignant neoplasm of pancreas: Secondary | ICD-10-CM | POA: Insufficient documentation

## 2022-02-06 DIAGNOSIS — K529 Noninfective gastroenteritis and colitis, unspecified: Secondary | ICD-10-CM | POA: Insufficient documentation

## 2022-02-06 DIAGNOSIS — D649 Anemia, unspecified: Secondary | ICD-10-CM | POA: Insufficient documentation

## 2022-02-06 DIAGNOSIS — D72819 Decreased white blood cell count, unspecified: Secondary | ICD-10-CM | POA: Insufficient documentation

## 2022-02-06 DIAGNOSIS — N179 Acute kidney failure, unspecified: Secondary | ICD-10-CM | POA: Diagnosis not present

## 2022-02-06 DIAGNOSIS — C250 Malignant neoplasm of head of pancreas: Secondary | ICD-10-CM | POA: Diagnosis not present

## 2022-02-06 DIAGNOSIS — R1084 Generalized abdominal pain: Secondary | ICD-10-CM

## 2022-02-06 HISTORY — DX: Malignant (primary) neoplasm, unspecified: C80.1

## 2022-02-06 LAB — BASIC METABOLIC PANEL
Anion gap: 13 (ref 5–15)
BUN: 57 mg/dL — ABNORMAL HIGH (ref 8–23)
CO2: 10 mmol/L — ABNORMAL LOW (ref 22–32)
Calcium: 8.5 mg/dL — ABNORMAL LOW (ref 8.9–10.3)
Chloride: 114 mmol/L — ABNORMAL HIGH (ref 98–111)
Creatinine, Ser: 3.41 mg/dL — ABNORMAL HIGH (ref 0.61–1.24)
GFR, Estimated: 18 mL/min — ABNORMAL LOW (ref >60)
Glucose, Bld: 108 mg/dL — ABNORMAL HIGH (ref 70–99)
Potassium: 3.2 mmol/L — ABNORMAL LOW (ref 3.5–5.1)
Sodium: 137 mmol/L (ref 135–145)

## 2022-02-06 LAB — DIFFERENTIAL
Basophils Absolute: 0 10*3/uL (ref 0.0–0.1)
Basophils Relative: 12 %
Eosinophils Absolute: 0.1 10*3/uL (ref 0.0–0.5)
Eosinophils Relative: 5 %
Lymphocytes Relative: 18 %
Lymphs Abs: 0.2 10*3/uL — ABNORMAL LOW (ref 0.7–4.0)
Monocytes Absolute: 0.2 10*3/uL (ref 0.1–1.0)
Monocytes Relative: 12 %
Neutro Abs: 0.6 10*3/uL — ABNORMAL LOW (ref 1.7–7.7)
Neutrophils Relative %: 51 %
nRBC: 0 /100 WBC

## 2022-02-06 LAB — HEPATIC FUNCTION PANEL
ALT: 25 U/L (ref 0–44)
AST: 15 U/L (ref 15–41)
Albumin: 3.4 g/dL — ABNORMAL LOW (ref 3.5–5.0)
Alkaline Phosphatase: 254 U/L — ABNORMAL HIGH (ref 38–126)
Bilirubin, Direct: 0.3 mg/dL — ABNORMAL HIGH (ref 0.0–0.2)
Indirect Bilirubin: 0.8 mg/dL (ref 0.3–0.9)
Total Bilirubin: 1.1 mg/dL (ref 0.3–1.2)
Total Protein: 6.1 g/dL — ABNORMAL LOW (ref 6.5–8.1)

## 2022-02-06 LAB — CBC
HCT: 22.3 % — ABNORMAL LOW (ref 39.0–52.0)
Hemoglobin: 7.2 g/dL — ABNORMAL LOW (ref 13.0–17.0)
MCH: 30.8 pg (ref 26.0–34.0)
MCHC: 32.3 g/dL (ref 30.0–36.0)
MCV: 95.3 fL (ref 80.0–100.0)
Platelets: 62 10*3/uL — ABNORMAL LOW (ref 150–400)
RBC: 2.34 MIL/uL — ABNORMAL LOW (ref 4.22–5.81)
RDW: 16.5 % — ABNORMAL HIGH (ref 11.5–15.5)
WBC: 1.3 10*3/uL — CL (ref 4.0–10.5)
nRBC: 0 % (ref 0.0–0.2)

## 2022-02-06 LAB — TROPONIN I (HIGH SENSITIVITY): Troponin I (High Sensitivity): 12 ng/L (ref <18)

## 2022-02-06 LAB — LIPASE, BLOOD: Lipase: <10 U/L — ABNORMAL LOW (ref 11–51)

## 2022-02-06 MED ORDER — HYDROMORPHONE HCL 1 MG/ML IJ SOLN
0.5000 mg | Freq: Once | INTRAMUSCULAR | Status: AC
  Administered 2022-02-06: 0.5 mg via INTRAVENOUS
  Filled 2022-02-06: qty 1

## 2022-02-06 MED ORDER — ONDANSETRON HCL 4 MG/2ML IJ SOLN
4.0000 mg | Freq: Once | INTRAMUSCULAR | Status: AC
  Administered 2022-02-06: 4 mg via INTRAVENOUS
  Filled 2022-02-06: qty 2

## 2022-02-06 MED ORDER — SODIUM CHLORIDE 0.9 % IV BOLUS
1000.0000 mL | Freq: Once | INTRAVENOUS | Status: AC
  Administered 2022-02-06: 1000 mL via INTRAVENOUS

## 2022-02-06 NOTE — ED Provider Notes (Signed)
?[facility] ?Provider Note ? ? ?CSN: [id number] ?Arrival date & time: 02/06/22  0159 ? ?  ? ?History ? ?Chief Complaint  ?Patient presents with  ? Chest Pain  ? ? ?Aaron Johnston is a 76 y.o. male. ? ?76 yo M with a chief complaints of upper abdominal discomfort.  This has been going on for about 3 days now.  Nothing seems to make it better.  Family states that he is somewhat anxious and frequently moves around trying to find a comfortable position.  He fell about a week ago and landed on his left shoulder.  Has had some pain there off and on.  Denies obvious exertional symptoms.  He has a history of pancreatic cancer and has been losing a lot of weight recently.  Every morning he wakes up and has to spit up some blood.  He is not sure exactly where this is coming from.  He denies cough congestion fevers or chills.  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 ? ? ?Chest Pain ? ?  ? ?Home Medications ?Prior to Admission medications   ?Medication Sig Start Date End Date Taking? Authorizing Provider  ?Accu-Chek FastClix Lancets MISC Apply topically. 08/30/20   [provider]  ?ACCU-CHEK GUIDE test strip  08/31/20   [provider]  ?Blood Glucose Monitoring Suppl (ACCU-CHEK GUIDE) w/Device KIT See admin instructions. 08/03/20   [provider]  ?diphenoxylate-atropine (LOMOTIL) 2.5-0.025 MG tablet Take 1-2 tablets by mouth 4 (four) times daily as needed for diarrhea or loose stools. 12/14/21   Owens Shark, NP  ?HYDROcodone-acetaminophen (NORCO/VICODIN) 5-325 MG tablet Take 1-2 tablets by mouth every 4 (four) hours as needed. 01/04/22   Owens Shark, NP  ?latanoprost (XALATAN) 0.005 % ophthalmic solution Place 1 drop into both eyes at bedtime.    [provider]  ?lidocaine-prilocaine (EMLA) cream Apply 1 application topically as directed. Apply to port site 1 hour prior to stick and cover with plastic wrap 10/19/21   Owens Shark, NP   ?lipase/protease/amylase (CREON) 36000 UNITS CPEP capsule Take 2 capsules (72,000 Units total) by mouth 3 (three) times daily with meals. May also take 1 capsule (36,000 Units total) as needed. ?Patient taking differently: Take 2 capsules (72,000 Units total) by mouth 3 (three) times daily with meals. May also take 1 capsule (36,000 Units total) as needed for diarrhea 08/06/21   Owens Shark, NP  ?loperamide (IMODIUM) 2 MG capsule Take 2-4 mg by mouth 3 (three) times daily as needed for diarrhea or loose stools.    [provider]  ?loratadine (CLARITIN) 10 MG tablet Take 10 mg by mouth daily.    [provider]  ?magnesium oxide (MAG-OX) 400 (240 Mg) MG tablet TAKE 1 TABLET BY MOUTH TWICE A DAY 01/08/22   Ladell Pier, MD  ?ondansetron (ZOFRAN) 8 MG tablet Take 1 tablet (8 mg total) by mouth every 8 (eight) hours as needed for nausea or vomiting. 07/23/20   Ladell Pier, MD  ?pantoprazole (PROTONIX) 40 MG tablet TAKE 1 TABLET BY MOUTH TWICE A DAY 12/28/21   Ladell Pier, MD  ?pioglitazone (ACTOS) 45 MG tablet Take 45 mg by mouth daily. 12/28/13   [provider]  ?potassium chloride SA (KLOR-CON M20) 20 MEQ tablet Take 1 tablet (20 mEq total) by mouth 2 (two) times daily. 12/28/21   Ladell Pier, MD  ?prochlorperazine (COMPAZINE) 10 MG tablet Take 1 tablet (10 mg total) by mouth every 6 (  six) hours as needed for nausea. 01/01/22   Owens Shark, NP  ?propranolol (INDERAL) 10 MG tablet TAKE 1 TABLET BY MOUTH TWICE A DAY 01/08/22   Ladell Pier, MD  ?tadalafil (CIALIS) 20 MG tablet Take 20 mg by mouth daily as needed for erectile dysfunction. 06/11/21   [provider]  ?   ? ?Allergies    ?Patient has no known allergies.   ? ?Review of Systems   ?Review of Systems  ?Cardiovascular:  Positive for chest pain.  ? ?Physical Exam ?Updated Vital Signs ?BP (!) 104/53   Pulse 86   Temp 98.4 ?F (36.9 ?C) (Oral)   Resp 10   SpO2 97%  ?Physical Exam ?Vitals and nursing note  reviewed.  ?Constitutional:   ?   Comments: Cachectic  ?HENT:  ?   Head: Normocephalic and atraumatic.  ?Eyes:  ?   Pupils: Pupils are equal, round, and reactive to light.  ?Neck:  ?   Vascular: No JVD.  ?Cardiovascular:  ?   Rate and Rhythm: Normal rate and regular rhythm.  ?   Heart sounds: No murmur heard. ?  No friction rub. No gallop.  ?Pulmonary:  ?   Effort: No respiratory distress.  ?   Breath sounds: No wheezing.  ?Abdominal:  ?   General: There is no distension.  ?   Tenderness: There is abdominal tenderness. There is no guarding or rebound.  ?   Comments: Diffuse pain in the abdomen but worse to the right lower quadrant  ?Musculoskeletal:     ?   General: Normal range of motion.  ?   Cervical back: Normal range of motion and neck supple.  ?Skin: ?   Coloration: Skin is not pale.  ?   Findings: No rash.  ?Neurological:  ?   Mental Status: He is alert and oriented to person, place, and time.  ?Psychiatric:     ?   Behavior: Behavior normal.  ? ? ?ED Results / Procedures / Treatments   ?Labs ?(all labs ordered are listed, but only abnormal results are displayed) ?Labs Reviewed  ?BASIC METABOLIC PANEL - Abnormal; Notable for the following components:  ?    Result Value  ? Potassium 3.2 (*)   ? Chloride 114 (*)   ? CO2 10 (*)   ? Glucose, Bld 108 (*)   ? BUN 57 (*)   ? Creatinine, Ser 3.41 (*)   ? Calcium 8.5 (*)   ? GFR, Estimated 18 (*)   ? All other components within normal limits  ?CBC - Abnormal; Notable for the following components:  ? WBC 1.3 (*)   ? RBC 2.34 (*)   ? Hemoglobin 7.2 (*)   ? HCT 22.3 (*)   ? RDW 16.5 (*)   ? Platelets 62 (*)   ? All other components within normal limits  ?HEPATIC FUNCTION PANEL - Abnormal; Notable for the following components:  ? Total Protein 6.1 (*)   ? Albumin 3.4 (*)   ? Alkaline Phosphatase 254 (*)   ? Bilirubin, Direct 0.3 (*)   ? All other components within normal limits  ?LIPASE, BLOOD - Abnormal; Notable for the following components:  ? Lipase <10 (*)   ? All  other components within normal limits  ?DIFFERENTIAL - Abnormal; Notable for the following components:  ? Neutro Abs 0.6 (*)   ? Lymphs Abs 0.2 (*)   ? All other components within normal limits  ?TROPONIN I (HIGH SENSITIVITY)  ?TROPONIN  I (HIGH SENSITIVITY)  ? ? ?EKG ?None ? ?Radiology ?DG Chest Port 1 View ? ?Result Date: 02/06/2022 ?CLINICAL DATA:  History of pancreatic carcinoma with shortness of breath EXAM: PORTABLE CHEST 1 VIEW COMPARISON:  10/13/2021 FINDINGS: Cardiac shadow is stable. Left chest wall port is again seen and stable. The lungs are hypoinflated but clear. No bony abnormality is seen. IMPRESSION: No acute abnormality noted. Electronically Signed   By: Inez Catalina M.D.   On: 02/06/2022 02:32  ? ?CT CHEST ABDOMEN PELVIS WO CONTRAST ? ?Result Date: 02/06/2022 ?CLINICAL DATA:  Pancreatic cancer.  Assess treatment response. EXAM: CT CHEST, ABDOMEN AND PELVIS WITHOUT CONTRAST TECHNIQUE: Multidetector CT imaging of the chest, abdomen and pelvis was performed following the standard protocol without IV contrast. RADIATION DOSE REDUCTION: This exam was performed according to the departmental dose-optimization program which includes automated exposure control, adjustment of the mA and/or kV according to patient size and/or use of iterative reconstruction technique. COMPARISON:  CT abdomen pelvis dated 08/27/2021. FINDINGS: Evaluation of this exam is limited in the absence of intravenous contrast. CT CHEST FINDINGS Cardiovascular: No cardiomegaly or pericardial effusion. There is coronary vascular calcification of the LAD. There is mild atherosclerotic calcification of the thoracic aorta. No aneurysmal dilatation. The central pulmonary arteries are grossly unremarkable. Left-sided Port-A-Cath with tip at the cavoatrial junction. Mediastinum/Nodes: No hilar or mediastinal adenopathy. The esophagus is grossly unremarkable. No mediastinal fluid collection. Lungs/Pleura: Bilateral subpleural reticulonodular  densities may represent scarring or sequela of metastatic disease. Atypical infection is less likely. A 1.9 x 1.3 cm nodular density in the anterior left upper lobe was present on the prior CT. No new consolidation. No pleural effu

## 2022-02-06 NOTE — ED Triage Notes (Signed)
3 days he has had stomach ache and chest pain. SOB. Hx of pancreatic cancer, last chemo about 1.5 weeks ago. Denies fevers.  ?

## 2022-02-06 NOTE — Discharge Instructions (Signed)
Please return for worsening pain, fever, dark stool or bloody stool.  Call your oncologist on Monday.  Return to the ED before hand if things worsen ?

## 2022-02-08 ENCOUNTER — Telehealth: Payer: Self-pay

## 2022-02-08 LAB — PATHOLOGIST SMEAR REVIEW

## 2022-02-08 NOTE — Telephone Encounter (Signed)
TC to Pt wife per Dr Benay Spice to follow up. Pt was seen in the ER on Saturday. Spoke with Pt's wife who stated Pt was agitated and couldn't get comfortable and had some abdominal discomfort. Pt's wife stated  he is still not feeling well today and is seeing the nephrologist today and is concerned about his Kidney functions. Pt has an appointment tomorrow to see Dr Benay Spice.  ?

## 2022-02-09 ENCOUNTER — Emergency Department (HOSPITAL_COMMUNITY): Payer: Medicare Other

## 2022-02-09 ENCOUNTER — Inpatient Hospital Stay: Payer: Medicare Other | Admitting: Nurse Practitioner

## 2022-02-09 ENCOUNTER — Other Ambulatory Visit: Payer: Self-pay | Admitting: Oncology

## 2022-02-09 ENCOUNTER — Inpatient Hospital Stay (HOSPITAL_COMMUNITY)
Admission: EM | Admit: 2022-02-09 | Discharge: 2022-02-16 | DRG: 683 | Disposition: A | Discharge status: Home Health | Payer: Medicare Other | Attending: Internal Medicine | Admitting: Internal Medicine

## 2022-02-09 ENCOUNTER — Inpatient Hospital Stay (HOSPITAL_COMMUNITY): Payer: Medicare Other

## 2022-02-09 ENCOUNTER — Inpatient Hospital Stay: Payer: Medicare Other

## 2022-02-09 ENCOUNTER — Other Ambulatory Visit: Payer: Self-pay

## 2022-02-09 ENCOUNTER — Telehealth: Payer: Self-pay | Admitting: Internal Medicine

## 2022-02-09 ENCOUNTER — Encounter (HOSPITAL_COMMUNITY): Payer: Self-pay | Admitting: Internal Medicine

## 2022-02-09 DIAGNOSIS — E876 Hypokalemia: Secondary | ICD-10-CM | POA: Diagnosis present

## 2022-02-09 DIAGNOSIS — E119 Type 2 diabetes mellitus without complications: Secondary | ICD-10-CM

## 2022-02-09 DIAGNOSIS — N179 Acute kidney failure, unspecified: Secondary | ICD-10-CM | POA: Diagnosis present

## 2022-02-09 DIAGNOSIS — Z833 Family history of diabetes mellitus: Secondary | ICD-10-CM | POA: Diagnosis not present

## 2022-02-09 DIAGNOSIS — K219 Gastro-esophageal reflux disease without esophagitis: Secondary | ICD-10-CM | POA: Diagnosis present

## 2022-02-09 DIAGNOSIS — C259 Malignant neoplasm of pancreas, unspecified: Secondary | ICD-10-CM | POA: Diagnosis not present

## 2022-02-09 DIAGNOSIS — C250 Malignant neoplasm of head of pancreas: Secondary | ICD-10-CM

## 2022-02-09 DIAGNOSIS — Z9221 Personal history of antineoplastic chemotherapy: Secondary | ICD-10-CM

## 2022-02-09 DIAGNOSIS — D649 Anemia, unspecified: Secondary | ICD-10-CM | POA: Diagnosis not present

## 2022-02-09 DIAGNOSIS — N401 Enlarged prostate with lower urinary tract symptoms: Secondary | ICD-10-CM | POA: Diagnosis present

## 2022-02-09 DIAGNOSIS — Z8719 Personal history of other diseases of the digestive system: Secondary | ICD-10-CM

## 2022-02-09 DIAGNOSIS — N189 Chronic kidney disease, unspecified: Secondary | ICD-10-CM | POA: Diagnosis present

## 2022-02-09 DIAGNOSIS — C787 Secondary malignant neoplasm of liver and intrahepatic bile duct: Secondary | ICD-10-CM | POA: Diagnosis not present

## 2022-02-09 DIAGNOSIS — D696 Thrombocytopenia, unspecified: Secondary | ICD-10-CM | POA: Diagnosis present

## 2022-02-09 DIAGNOSIS — Y846 Urinary catheterization as the cause of abnormal reaction of the patient, or of later complication, without mention of misadventure at the time of the procedure: Secondary | ICD-10-CM | POA: Diagnosis not present

## 2022-02-09 DIAGNOSIS — E872 Acidosis, unspecified: Secondary | ICD-10-CM | POA: Diagnosis present

## 2022-02-09 DIAGNOSIS — N289 Disorder of kidney and ureter, unspecified: Principal | ICD-10-CM

## 2022-02-09 DIAGNOSIS — I10 Essential (primary) hypertension: Secondary | ICD-10-CM | POA: Diagnosis present

## 2022-02-09 DIAGNOSIS — T83518A Infection and inflammatory reaction due to other urinary catheter, initial encounter: Secondary | ICD-10-CM | POA: Diagnosis not present

## 2022-02-09 DIAGNOSIS — D72829 Elevated white blood cell count, unspecified: Secondary | ICD-10-CM | POA: Diagnosis not present

## 2022-02-09 DIAGNOSIS — Z87891 Personal history of nicotine dependence: Secondary | ICD-10-CM

## 2022-02-09 DIAGNOSIS — G4700 Insomnia, unspecified: Secondary | ICD-10-CM | POA: Diagnosis present

## 2022-02-09 DIAGNOSIS — Z79899 Other long term (current) drug therapy: Secondary | ICD-10-CM

## 2022-02-09 DIAGNOSIS — Z800 Family history of malignant neoplasm of digestive organs: Secondary | ICD-10-CM

## 2022-02-09 DIAGNOSIS — N138 Other obstructive and reflux uropathy: Secondary | ICD-10-CM | POA: Diagnosis present

## 2022-02-09 DIAGNOSIS — K529 Noninfective gastroenteritis and colitis, unspecified: Secondary | ICD-10-CM | POA: Diagnosis not present

## 2022-02-09 DIAGNOSIS — I9589 Other hypotension: Secondary | ICD-10-CM | POA: Diagnosis not present

## 2022-02-09 DIAGNOSIS — N136 Pyonephrosis: Secondary | ICD-10-CM | POA: Diagnosis not present

## 2022-02-09 DIAGNOSIS — E861 Hypovolemia: Secondary | ICD-10-CM | POA: Diagnosis not present

## 2022-02-09 HISTORY — DX: Chronic kidney disease, unspecified: N17.9

## 2022-02-09 LAB — CBC WITH DIFFERENTIAL/PLATELET
Abs Immature Granulocytes: 0.42 10*3/uL — ABNORMAL HIGH (ref 0.00–0.07)
Basophils Absolute: 0.1 10*3/uL (ref 0.0–0.1)
Basophils Relative: 1 %
Eosinophils Absolute: 0.1 10*3/uL (ref 0.0–0.5)
Eosinophils Relative: 1 %
HCT: 27.1 % — ABNORMAL LOW (ref 39.0–52.0)
Hemoglobin: 8.7 g/dL — ABNORMAL LOW (ref 13.0–17.0)
Immature Granulocytes: 3 %
Lymphocytes Relative: 4 %
Lymphs Abs: 0.7 10*3/uL (ref 0.7–4.0)
MCH: 31.8 pg (ref 26.0–34.0)
MCHC: 32.1 g/dL (ref 30.0–36.0)
MCV: 98.9 fL (ref 80.0–100.0)
Monocytes Absolute: 0.8 10*3/uL (ref 0.1–1.0)
Monocytes Relative: 5 %
Neutro Abs: 15 10*3/uL — ABNORMAL HIGH (ref 1.7–7.7)
Neutrophils Relative %: 86 %
Platelets: 237 10*3/uL (ref 150–400)
RBC: 2.74 MIL/uL — ABNORMAL LOW (ref 4.22–5.81)
RDW: 17.7 % — ABNORMAL HIGH (ref 11.5–15.5)
WBC: 17 10*3/uL — ABNORMAL HIGH (ref 4.0–10.5)
nRBC: 0 % (ref 0.0–0.2)

## 2022-02-09 LAB — BASIC METABOLIC PANEL
Anion gap: 11 (ref 5–15)
BUN: 42 mg/dL — ABNORMAL HIGH (ref 8–23)
CO2: 9 mmol/L — ABNORMAL LOW (ref 22–32)
Calcium: 8.5 mg/dL — ABNORMAL LOW (ref 8.9–10.3)
Chloride: 121 mmol/L — ABNORMAL HIGH (ref 98–111)
Creatinine, Ser: 4.43 mg/dL — ABNORMAL HIGH (ref 0.61–1.24)
GFR, Estimated: 13 mL/min — ABNORMAL LOW (ref >60)
Glucose, Bld: 120 mg/dL — ABNORMAL HIGH (ref 70–99)
Potassium: 3.2 mmol/L — ABNORMAL LOW (ref 3.5–5.1)
Sodium: 141 mmol/L (ref 135–145)

## 2022-02-09 LAB — SODIUM, URINE, RANDOM: Sodium, Ur: 46 mmol/L

## 2022-02-09 LAB — URINALYSIS, ROUTINE W REFLEX MICROSCOPIC
Bilirubin Urine: NEGATIVE
Glucose, UA: NEGATIVE mg/dL
Ketones, ur: NEGATIVE mg/dL
Leukocytes,Ua: NEGATIVE
Nitrite: NEGATIVE
Protein, ur: NEGATIVE mg/dL
Specific Gravity, Urine: 1.006 (ref 1.005–1.030)
pH: 5 (ref 5.0–8.0)

## 2022-02-09 LAB — MAGNESIUM: Magnesium: 1.3 mg/dL — ABNORMAL LOW (ref 1.7–2.4)

## 2022-02-09 LAB — CBG MONITORING, ED: Glucose-Capillary: 104 mg/dL — ABNORMAL HIGH (ref 70–99)

## 2022-02-09 LAB — BRAIN NATRIURETIC PEPTIDE: B Natriuretic Peptide: 197.7 pg/mL — ABNORMAL HIGH (ref 0.0–100.0)

## 2022-02-09 MED ORDER — SODIUM CHLORIDE 0.9 % IV BOLUS
500.0000 mL | Freq: Once | INTRAVENOUS | Status: AC
  Administered 2022-02-09: 500 mL via INTRAVENOUS

## 2022-02-09 MED ORDER — ALBUTEROL SULFATE (2.5 MG/3ML) 0.083% IN NEBU: 2.5000 mg | INHALATION_SOLUTION | Freq: Four times a day (QID) | RESPIRATORY_TRACT | Status: DC | PRN

## 2022-02-09 MED ORDER — LATANOPROST 0.005 % OP SOLN
1.0000 [drp] | Freq: Every day | OPHTHALMIC | Status: DC
  Administered 2022-02-10 – 2022-02-15 (×7): 1 [drp] via OPHTHALMIC
  Filled 2022-02-09: qty 2.5

## 2022-02-09 MED ORDER — LORATADINE 10 MG PO TABS
10.0000 mg | ORAL_TABLET | Freq: Every day | ORAL | Status: DC
  Administered 2022-02-10 – 2022-02-16 (×6): 10 mg via ORAL
  Filled 2022-02-09 (×6): qty 1

## 2022-02-09 MED ORDER — SODIUM BICARBONATE 8.4 % IV SOLN
INTRAVENOUS | Status: DC
  Filled 2022-02-09 (×9): qty 1000

## 2022-02-09 MED ORDER — HEPARIN SODIUM (PORCINE) 5000 UNIT/ML IJ SOLN
5000.0000 [IU] | Freq: Three times a day (TID) | INTRAMUSCULAR | Status: DC
  Administered 2022-02-09 – 2022-02-12 (×9): 5000 [IU] via SUBCUTANEOUS
  Filled 2022-02-09 (×9): qty 1

## 2022-02-09 MED ORDER — LIDOCAINE-PRILOCAINE 2.5-2.5 % EX CREA
1.0000 "application " | TOPICAL_CREAM | CUTANEOUS | Status: DC | PRN
  Filled 2022-02-09: qty 5

## 2022-02-09 MED ORDER — POTASSIUM CHLORIDE 20 MEQ PO PACK
40.0000 meq | PACK | Freq: Once | ORAL | Status: AC
  Administered 2022-02-09: 40 meq via ORAL
  Filled 2022-02-09: qty 2

## 2022-02-09 MED ORDER — HYDROCODONE-ACETAMINOPHEN 5-325 MG PO TABS: 1.0000 | ORAL_TABLET | ORAL | Status: DC | PRN

## 2022-02-09 MED ORDER — LORAZEPAM 0.5 MG PO TABS: 0.2500 mg | ORAL_TABLET | Freq: Every evening | ORAL | Status: DC | PRN

## 2022-02-09 MED ORDER — ONDANSETRON HCL 4 MG/2ML IJ SOLN: 4.0000 mg | Freq: Four times a day (QID) | INTRAMUSCULAR | Status: DC | PRN

## 2022-02-09 MED ORDER — LOPERAMIDE HCL 2 MG PO CAPS
4.0000 mg | ORAL_CAPSULE | Freq: Three times a day (TID) | ORAL | Status: DC | PRN
  Administered 2022-02-11 – 2022-02-16 (×12): 4 mg via ORAL
  Filled 2022-02-09 (×12): qty 2

## 2022-02-09 MED ORDER — PANCRELIPASE (LIP-PROT-AMYL) 36000-114000 UNITS PO CPEP
72000.0000 [IU] | ORAL_CAPSULE | Freq: Three times a day (TID) | ORAL | Status: DC
  Administered 2022-02-09 – 2022-02-16 (×18): 72000 [IU] via ORAL
  Filled 2022-02-09 (×20): qty 2

## 2022-02-09 MED ORDER — ONDANSETRON HCL 4 MG PO TABS: 4.0000 mg | ORAL_TABLET | Freq: Four times a day (QID) | ORAL | Status: DC | PRN

## 2022-02-09 MED ORDER — MELATONIN 3 MG PO TABS
3.0000 mg | ORAL_TABLET | Freq: Every day | ORAL | Status: DC
  Administered 2022-02-10 – 2022-02-15 (×3): 3 mg via ORAL
  Filled 2022-02-09 (×5): qty 1

## 2022-02-09 MED ORDER — PROCHLORPERAZINE MALEATE 10 MG PO TABS
10.0000 mg | ORAL_TABLET | Freq: Four times a day (QID) | ORAL | Status: DC | PRN
  Filled 2022-02-09: qty 1

## 2022-02-09 MED ORDER — ACETAMINOPHEN 650 MG RE SUPP: 650.0000 mg | Freq: Four times a day (QID) | RECTAL | Status: DC | PRN

## 2022-02-09 MED ORDER — PANCRELIPASE (LIP-PROT-AMYL) 36000-114000 UNITS PO CPEP
36000.0000 [IU] | ORAL_CAPSULE | ORAL | Status: DC | PRN
  Filled 2022-02-09: qty 1

## 2022-02-09 MED ORDER — ACETAMINOPHEN 325 MG PO TABS
650.0000 mg | ORAL_TABLET | Freq: Four times a day (QID) | ORAL | Status: DC | PRN
  Administered 2022-02-12 – 2022-02-13 (×2): 650 mg via ORAL
  Filled 2022-02-09 (×2): qty 2

## 2022-02-09 MED ORDER — MAGNESIUM OXIDE -MG SUPPLEMENT 400 (240 MG) MG PO TABS
400.0000 mg | ORAL_TABLET | Freq: Two times a day (BID) | ORAL | Status: DC
  Administered 2022-02-09 – 2022-02-16 (×13): 400 mg via ORAL
  Filled 2022-02-09 (×13): qty 1

## 2022-02-09 MED ORDER — MAGNESIUM SULFATE 2 GM/50ML IV SOLN
2.0000 g | Freq: Once | INTRAVENOUS | Status: AC
  Administered 2022-02-09: 2 g via INTRAVENOUS
  Filled 2022-02-09: qty 50

## 2022-02-09 MED ORDER — PANTOPRAZOLE SODIUM 40 MG PO TBEC
40.0000 mg | DELAYED_RELEASE_TABLET | Freq: Two times a day (BID) | ORAL | Status: DC
  Administered 2022-02-09 – 2022-02-12 (×7): 40 mg via ORAL
  Filled 2022-02-09 (×8): qty 1

## 2022-02-09 NOTE — ED Provider Notes (Signed)
?Aaron Johnston ?Provider Note ? ? ?CSN: [id number] ?Arrival date & time: 02/09/22  1010 ? ?  ? ?History ? ?No chief complaint on file. ? ? ?Aaron Johnston is a 76 y.o. male. ? ?Aaron Johnston is a 76 yr old male with PMH of stage 4 pancreatic cancer, HTN, diabetes, chronic thrombocytopenia and GI bleed who presents today for worsening kidney function.  Patient was seen in the nephrology office yesterday with Dr. Osborne Casco who obtained labs.  Labs showed multiple electrolyte abnormalities as well as low hemoglobin and worsening creatinine and BUN.  Dr. Osborne Casco called the patient to go to the ED today for admission.  1 month ago patient's creatinine was at her baseline of 1.2, and she has now been increasing slowly to 3-4.  It is unclear why his kidneys are failing.  Patient has had chemo fusions every 2 weeks at [facility]  Patient reports that he feels chronically tired and has ongoing weight loss but denies any new symptoms today.  Recently seen in the ED 2 days ago for shortness of breath and abdominal pain. ? ? ? ?  ? ?Home Medications ?Prior to Admission medications   ?Medication Sig Start Date End Date Taking? Authorizing Provider  ?diphenoxylate-atropine (LOMOTIL) 2.5-0.025 MG tablet Take 1-2 tablets by mouth 4 (four) times daily as needed for diarrhea or loose stools. ?Patient taking differently: Take 1 tablet by mouth 4 (four) times daily as needed for diarrhea or loose stools. 12/14/21  Yes Owens Shark, NP  ?HYDROcodone-acetaminophen (NORCO/VICODIN) 5-325 MG tablet Take 1-2 tablets by mouth every 4 (four) hours as needed. ?Patient taking differently: Take 1 tablet by mouth every 4 (four) hours as needed. 01/04/22  Yes Owens Shark, NP  ?latanoprost (XALATAN) 0.005 % ophthalmic solution Place 1 drop into both eyes at bedtime.   Yes [provider]  ?lidocaine-prilocaine (EMLA) cream Apply 1 application topically as directed. Apply to port site 1 hour prior  to stick and cover with plastic wrap ?Patient taking differently: Apply 1 application. topically as needed (chemo port site). 10/19/21  Yes Owens Shark, NP  ?lipase/protease/amylase (CREON) 36000 UNITS CPEP capsule Take 2 capsules (72,000 Units total) by mouth 3 (three) times daily with meals. May also take 1 capsule (36,000 Units total) as needed. ?Patient taking differently: 2 capsules (72,000units) 3 times daily with meal. An additional 1 capsule(36,000 units) for snack 08/06/21  Yes Owens Shark, NP  ?loperamide (IMODIUM) 2 MG capsule Take 4 mg by mouth 3 (three) times daily as needed for diarrhea or loose stools.   Yes [provider]  ?loratadine (CLARITIN) 10 MG tablet Take 10 mg by mouth daily.   Yes [provider]  ?magnesium oxide (MAG-OX) 400 (240 Mg) MG tablet TAKE 1 TABLET BY MOUTH TWICE A DAY ?Patient taking differently: Take 400 mg by mouth 2 (two) times daily. 01/08/22  Yes Ladell Pier, MD  ?pantoprazole (PROTONIX) 40 MG tablet TAKE 1 TABLET BY MOUTH TWICE A DAY ?Patient taking differently: 40 mg 2 (two) times daily. 12/28/21  Yes Ladell Pier, MD  ?pioglitazone (ACTOS) 30 MG tablet Take 30 mg by mouth daily. 02/01/22  Yes [provider]  ?potassium chloride SA (KLOR-CON M20) 20 MEQ tablet Take 1 tablet (20 mEq total) by mouth 2 (two) times daily. 12/28/21  Yes Ladell Pier, MD  ?prochlorperazine (COMPAZINE) 10 MG tablet Take 1 tablet (10 mg total) by mouth every 6 (six) hours as needed for  nausea. 01/01/22  Yes Owens Shark, NP  ?propranolol (INDERAL) 10 MG tablet TAKE 1 TABLET BY MOUTH TWICE A DAY ?Patient taking differently: Take 10 mg by mouth 2 (two) times daily. 01/08/22  Yes Ladell Pier, MD  ?tadalafil (CIALIS) 20 MG tablet Take 20 mg by mouth daily as needed for erectile dysfunction. 06/11/21  Yes [provider]  ?Accu-Chek FastClix Lancets MISC Apply topically. 08/30/20   [provider]  ?ACCU-CHEK GUIDE test strip  08/31/20    [provider]  ?Blood Glucose Monitoring Suppl (ACCU-CHEK GUIDE) w/Device KIT See admin instructions. 08/03/20   [provider]  ?   ? ?Allergies    ?Patient has no known allergies.   ? ?Review of Systems   ?Review of Systems ? ?Physical Exam ?Updated Vital Signs ?BP 115/68 (BP Location: Right Arm)   Pulse (!) 108   Temp 98.4 ?F (36.9 ?C) (Oral)   Resp 15   Ht 5' 9.02" (1.753 m)   Wt 67.5 kg   SpO2 99%   BMI 21.97 kg/m?  ?Physical Exam ?Vitals reviewed.  ?Constitutional:   ?   General: He is not in acute distress. ?   Appearance: He is not ill-appearing, toxic-appearing or diaphoretic.  ?HENT:  ?   Head: Normocephalic and atraumatic.  ?   Comments: Frail appearing male  ?   Nose: Nose normal.  ?Eyes:  ?   Extraocular Movements: Extraocular movements intact.  ?Cardiovascular:  ?   Rate and Rhythm: Normal rate and regular rhythm.  ?Pulmonary:  ?   Effort: Pulmonary effort is normal.  ?   Breath sounds: Normal breath sounds.  ?Abdominal:  ?   General: Abdomen is flat.  ?   Palpations: Abdomen is soft.  ?Musculoskeletal:  ?   Cervical back: Normal range of motion and neck supple.  ?Neurological:  ?   Mental Status: He is alert.  ? ? ?ED Results / Procedures / Treatments   ?Labs ?(all labs ordered are listed, but only abnormal results are displayed) ?Labs Reviewed  ?BASIC METABOLIC PANEL - Abnormal; Notable for the following components:  ?    Result Value  ? Potassium 3.2 (*)   ? Chloride 121 (*)   ? CO2 9 (*)   ? Glucose, Bld 120 (*)   ? BUN 42 (*)   ? Creatinine, Ser 4.43 (*)   ? Calcium 8.5 (*)   ? GFR, Estimated 13 (*)   ? All other components within normal limits  ?CBC WITH DIFFERENTIAL/PLATELET - Abnormal; Notable for the following components:  ? WBC 17.0 (*)   ? RBC 2.74 (*)   ? Hemoglobin 8.7 (*)   ? HCT 27.1 (*)   ? RDW 17.7 (*)   ? Neutro Abs 15.0 (*)   ? Abs Immature Granulocytes 0.42 (*)   ? All other components within normal limits  ?MAGNESIUM - Abnormal; Notable for the following  components:  ? Magnesium 1.3 (*)   ? All other components within normal limits  ?BRAIN NATRIURETIC PEPTIDE - Abnormal; Notable for the following components:  ? B Natriuretic Peptide 197.7 (*)   ? All other components within normal limits  ?URINALYSIS, ROUTINE W REFLEX MICROSCOPIC - Abnormal; Notable for the following components:  ? Hgb urine dipstick SMALL (*)   ? Bacteria, UA RARE (*)   ? All other components within normal limits  ?RENAL FUNCTION PANEL - Abnormal; Notable for the following components:  ? Potassium 2.8 (*)   ? Chloride  120 (*)   ? CO2 12 (*)   ? Glucose, Bld 121 (*)   ? BUN 38 (*)   ? Creatinine, Ser 4.04 (*)   ? Calcium 8.1 (*)   ? Phosphorus 2.4 (*)   ? Albumin 2.7 (*)   ? GFR, Estimated 15 (*)   ? All other components within normal limits  ?CBC - Abnormal; Notable for the following components:  ? RBC 2.42 (*)   ? Hemoglobin 7.5 (*)   ? HCT 23.6 (*)   ? RDW 17.5 (*)   ? All other components within normal limits  ?MAGNESIUM - Abnormal; Notable for the following components:  ? Magnesium 1.5 (*)   ? All other components within normal limits  ?RENAL FUNCTION PANEL - Abnormal; Notable for the following components:  ? Potassium 3.3 (*)   ? Chloride 112 (*)   ? CO2 17 (*)   ? Glucose, Bld 142 (*)   ? BUN 30 (*)   ? Creatinine, Ser 4.00 (*)   ? Calcium 8.4 (*)   ? Phosphorus 1.5 (*)   ? Albumin 2.8 (*)   ? GFR, Estimated 15 (*)   ? All other components within normal limits  ?CBG MONITORING, ED - Abnormal; Notable for the following components:  ? Glucose-Capillary 104 (*)   ? All other components within normal limits  ?SODIUM, URINE, RANDOM  ?MAGNESIUM  ? ? ?EKG ?EKG Interpretation ? ?Date/Time:  Tuesday February 09 2022 11:26:31 EDT ?Ventricular Rate:  75 ?PR Interval:  156 ?QRS Duration: 98 ?QT Interval:  440 ?QTC Calculation: 491 ?R Axis:   8 ?Text Interpretation: Sinus rhythm with occasional Premature ventricular complexes Nonspecific T wave abnormality Prolonged QT Abnormal ECG When compared with ECG of  06-Feb-2022 02:12, QT has lengthened Confirmed by Delora Fuel ([id number]) on 02/10/2022 5:12:09 AM ? ?Radiology ?DG Chest 2 View ? ?Result Date: 02/09/2022 ?CLINICAL DATA:  Shortness of breath. Fatigue. Pancreatic

## 2022-02-09 NOTE — Assessment & Plan Note (Signed)
Acute.  Initial magnesium 1.3.  Patient was ordered 2 g of magnesium sulfate IV. ?

## 2022-02-09 NOTE — Assessment & Plan Note (Addendum)
Patient presented with notable worsening kidney function.  Creatinine 4.43 with BUN 43.  Patient's creatinine had been more within a normal range in January of this year and 1.2.  Renal ultrasound last month noted concern for pelvic fullness with distended bladder and enlarged prostate.  Being followed by Dr. Osborne Casco of nephrology. ?-Admit to a medical telemetry bed ?-Check urinalysis and renal ultrasound ?-Insert Foley catheter for Strict I&Os and concern for bladder outlet obstruction. ?-Continue sodium bicarb drip at 100 mL/h ?-Check renal function panels daily ?

## 2022-02-09 NOTE — ED Triage Notes (Signed)
Wife stated, sent to nephrology Dr. Wilburn Mylar, and was sent here for abnormal kidney function.  ?

## 2022-02-09 NOTE — ED Notes (Signed)
;  pt is a hard stick ? ?

## 2022-02-09 NOTE — H&P (Addendum)
?History and Physical  ? ? ?Patient: Aaron Johnston EQA:[id number] DOB: 13-May-1946 ?DOA: 02/09/2022 ?DOS: the patient was seen and examined on 02/09/2022 ?PCP: [name], [name], MD  ?Patient coming from: Home ? ?Chief Complaint: Decreased kidney ? ?HPI: Aaron Johnston is a 76 y.o. male with medical history significant of hypertension, hyperlipidemia, diabetes mellitus type 2, GI bleed secondary to esophageal varices status post banding, and pancreatic cancer diagnosed in 2021 followed by Dr. Learta Codding  on chemotherapy presents with reports of worsening kidney function after lab work obtained yesterday by his nephrologist Dr. Osborne Casco.  Patient's kidney function previously had been around 1.2 in January of this year, but since that time has slowly trended up from 2->3. He had an renal ultrasound 2/23 which noted mild renal pelvic fullness bilaterally thought to be secondary to distended bladder with enlarged prostate.  His wife is present at bedside and notes that over the last 2 to 3 weeks his appetite has definitely decreased.  Patient complains of things having a bad taste possibly related to to his chemotherapy.  Associated symptoms include decreased urine output, intermittent nosebleeds, weight loss of approximately 7 pounds (119 -> 112 ),wife reports issues with his memory, and intermittently being confused.  Denies having any fever, cough, or blood in his stool/urine.  He reports still having bowel movements and being able to pass flatus.  He had just received infusions of pegfilgrastim and romiplostim  on 3/16.  His last chemotherapy treatment fluorouracil, leucovorin, and irinotecan was on 3/14.  Lastly patient brought up these been having difficulty sleeping, but patient's wife notes that he sleeps throughout the day. ? ?Upon admission into the emergency department patient was noted to be afebrile with vital signs relatively stable.  WBC 17, hemoglobin 8.7, potassium 3.2, CO2 9, BUN 42, creatinine 4.43, and magnesium  1.3.  Chest x-ray noted no acute abnormality.  Neurology was formally consulted.  Patient had been given 500 mL bolus of IV fluids, potassium chloride 40 mEq, magnesium sulfate 2 g IV, and started on sodium bicarb drip at 100 mL/h. ? ? ?Review of Systems: As mentioned in the history of present illness. All other systems reviewed and are negative. ?Past Medical History:  ?Diagnosis Date  ? Cancer [facility])   ? Diabetes mellitus without complication ([place])   ? Family history of breast cancer   ? Family history of colon cancer   ? Family history of pancreatic cancer   ? Family history of stomach cancer   ? ?Past Surgical History:  ?Procedure Laterality Date  ? BIOPSY  08/29/2021  ? Procedure: BIOPSY;  Surgeon: Otis Brace, MD;  [facility]: [facility];  Service: Gastroenterology;;  ? COLONOSCOPY WITH PROPOFOL N/A 08/29/2021  ? Procedure: COLONOSCOPY WITH PROPOFOL;  Surgeon: Otis Brace, MD;  [facility];  Service: Gastroenterology;  Laterality: N/A;  ? ESOPHAGEAL BANDING  09/17/2021  ? Procedure: ESOPHAGEAL BANDING;  Surgeon: Otis Brace, MD;  [facility];  Service: Gastroenterology;;  ? ESOPHAGEAL BANDING N/A 11/03/2021  ? Procedure: ESOPHAGEAL BANDING;  Surgeon: Otis Brace, MD;  [facility];  Service: Gastroenterology;  Laterality: N/A;  ? ESOPHAGOGASTRODUODENOSCOPY (EGD) WITH PROPOFOL N/A 08/27/2021  ? Procedure: ESOPHAGOGASTRODUODENOSCOPY (EGD) WITH PROPOFOL;  Surgeon: Clarene Essex, MD;  [facility];  Service: Endoscopy;  Laterality: N/A;  ? ESOPHAGOGASTRODUODENOSCOPY (EGD) WITH PROPOFOL N/A 09/17/2021  ? Procedure: ESOPHAGOGASTRODUODENOSCOPY (EGD) WITH PROPOFOL;  Surgeon: Otis Brace, MD;  [facility];  Service: Gastroenterology;  Laterality: N/A;  ? ESOPHAGOGASTRODUODENOSCOPY (EGD) WITH PROPOFOL N/A 11/03/2021  ?  Procedure: ESOPHAGOGASTRODUODENOSCOPY (EGD) WITH PROPOFOL;  Surgeon: Otis Brace, MD;  [facility];  Service:  Gastroenterology;  Laterality: N/A;  ? ?Social History:  reports that he quit smoking about 41 years ago. His smoking use included cigarettes. He has never used smokeless tobacco. He reports that he does not currently use alcohol. He reports that he does not use drugs. ? ?No Known Allergies ? ?Family History  ?Problem Relation Age of Onset  ? Diabetes Mother   ? Pancreatic cancer Father   ?     d. 40  ? Breast cancer Sister 7  ? Diabetes Sister   ? Diabetes Brother   ? Stomach cancer Paternal Aunt   ?     2 pat aunts with stomach cancer  ? Colon cancer Paternal Uncle   ? Cancer Paternal Grandfather   ?     NOS  ? Diabetes Sister   ? Diabetes Brother   ? Brain cancer Paternal Aunt   ? Cancer Paternal Aunt   ?     eye  ? Diabetes Son   ? ? ?Prior to Admission medications   ?Medication Sig Start Date End Date Taking? Authorizing Provider  ?diphenoxylate-atropine (LOMOTIL) 2.5-0.025 MG tablet Take 1-2 tablets by mouth 4 (four) times daily as needed for diarrhea or loose stools. ?Patient taking differently: Take 1 tablet by mouth 4 (four) times daily as needed for diarrhea or loose stools. 12/14/21  Yes Owens Shark, NP  ?HYDROcodone-acetaminophen (NORCO/VICODIN) 5-325 MG tablet Take 1-2 tablets by mouth every 4 (four) hours as needed. ?Patient taking differently: Take 1 tablet by mouth every 4 (four) hours as needed. 01/04/22  Yes Owens Shark, NP  ?latanoprost (XALATAN) 0.005 % ophthalmic solution Place 1 drop into both eyes at bedtime.   Yes [provider]  ?lidocaine-prilocaine (EMLA) cream Apply 1 application topically as directed. Apply to port site 1 hour prior to stick and cover with plastic wrap ?Patient taking differently: Apply 1 application. topically as needed (chemo port site). 10/19/21  Yes Owens Shark, NP  ?lipase/protease/amylase (CREON) 36000 UNITS CPEP capsule Take 2 capsules (72,000 Units total) by mouth 3 (three) times daily with meals. May also take 1 capsule (36,000 Units total) as  needed. ?Patient taking differently: 2 capsules (72,000units) 3 times daily with meal. An additional 1 capsule(36,000 units) for snack 08/06/21  Yes Owens Shark, NP  ?loperamide (IMODIUM) 2 MG capsule Take 4 mg by mouth 3 (three) times daily as needed for diarrhea or loose stools.   Yes [provider]  ?loratadine (CLARITIN) 10 MG tablet Take 10 mg by mouth daily.   Yes [provider]  ?magnesium oxide (MAG-OX) 400 (240 Mg) MG tablet TAKE 1 TABLET BY MOUTH TWICE A DAY ?Patient taking differently: Take 400 mg by mouth 2 (two) times daily. 01/08/22  Yes Ladell Pier, MD  ?pantoprazole (PROTONIX) 40 MG tablet TAKE 1 TABLET BY MOUTH TWICE A DAY ?Patient taking differently: 40 mg 2 (two) times daily. 12/28/21  Yes Ladell Pier, MD  ?pioglitazone (ACTOS) 30 MG tablet Take 30 mg by mouth daily. 02/01/22  Yes [provider]  ?potassium chloride SA (KLOR-CON M20) 20 MEQ tablet Take 1 tablet (20 mEq total) by mouth 2 (two) times daily. 12/28/21  Yes Ladell Pier, MD  ?prochlorperazine (COMPAZINE) 10 MG tablet Take 1 tablet (10 mg total) by mouth every 6 (six) hours as needed for nausea. 01/01/22  Yes Owens Shark, NP  ?propranolol Alphia Moh)  10 MG tablet TAKE 1 TABLET BY MOUTH TWICE A DAY ?Patient taking differently: Take 10 mg by mouth 2 (two) times daily. 01/08/22  Yes Ladell Pier, MD  ?tadalafil (CIALIS) 20 MG tablet Take 20 mg by mouth daily as needed for erectile dysfunction. 06/11/21  Yes [provider]  ?Accu-Chek FastClix Lancets MISC Apply topically. 08/30/20   [provider]  ?ACCU-CHEK GUIDE test strip  08/31/20   [provider]  ?Blood Glucose Monitoring Suppl (ACCU-CHEK GUIDE) w/Device KIT See admin instructions. 08/03/20   [provider]  ? ? ?Physical Exam: ?Vitals:  ? 02/09/22 1400 02/09/22 1430 02/09/22 1500 02/09/22 1534  ?BP: (!) 100/58 101/62 (!) 95/51 113/65  ?Pulse: 69 79  73  ?Resp: _0 ?Temp:      ?SpO2: 99% 99%   99%  ? ?Exam ? ?Constitutional: Chronically ill-appearing elderly male currently in no acute ?Eyes: PERRL, lids and conjunctivae normal ?ENMT: Mucous membranes are moist. Posterior pharynx clear of

## 2022-02-09 NOTE — Assessment & Plan Note (Addendum)
Acute.  Initial potassium 3.2.  Patient had been given 40 mEq of potassium chloride p.o. he had been on scheduled potassium chloride 20 mEq twice daily ?-Continue to monitor and replace as needed given worsening kidney function ?

## 2022-02-09 NOTE — Assessment & Plan Note (Signed)
Continue Protonix °

## 2022-02-09 NOTE — Assessment & Plan Note (Addendum)
Hemoglobin 8.7 g/dL which appears improved from 7.2 on 3/25.  However, suspect this is secondary to hemoconcentration given worsening kidney function.  Has a history of esophageal varices last banded 10/2021.  Reportedly has had intermittent nosebleeds, but denies any blood in stool or urine. ?-Recheck CBC in a.m. ?

## 2022-02-09 NOTE — Assessment & Plan Note (Signed)
Patient complains of having difficulty sleeping at night. ?-Melatonin nightly ?-Low-dose Ativan p.o. if melatonin not helpful ?

## 2022-02-09 NOTE — Telephone Encounter (Signed)
76 year old male with a history of DM 2, HLD, HTN, pancreatic cancer who I am sending to the emergency department for severe AKI. ? ?The patient had a normal creatinine as recently as 12/14/21.  His creatinine increased significantly to 2.5 in February 2023 and increased further since that time.  I have evaluated him in the office yesterday and his labs came back with a creatinine of 4.1 and significant acidosis with a bicarbonate of 10 and potassium of 3.3 and magnesium of 1.3.  He was exhibiting some signs that could be consistent with uremia.  Because of his precipitously worsening kidney function, uremia, acidosis, electrolyte issues I felt it was best for him to proceed to the emergency department for admission. ? ?I will notify Dr. Moshe Cipro that he will be coming to the emergency department.  ?

## 2022-02-09 NOTE — Assessment & Plan Note (Signed)
Patient with pancreatic cancer at the head of the pancreas diagnosed in 2021 currently on chemotherapy followed by Dr. Benay Spice.  Last chemotherapy treatment fluorouracil, leucovorin, and irinotecan was on 3/14 ?-Dr. Learta Codding  added to the treatment team ?

## 2022-02-09 NOTE — ED Notes (Signed)
Pt changed with new sheets and blankets ?

## 2022-02-09 NOTE — Consult Note (Addendum)
[facility] ?Renal Consultation Note ? ?Requesting MD: Roslynn Amble ?Indication for Consultation: subacute renal failure with acidosis, hypokalemia, hypomagnesemia ? ?HPI:  ?Aaron Johnston is a 76 y.o. male with past medical history significant for DM and mostly for metastatic pancreatic CA found in late 2021 s/p  and continuing to get chemotherapy per Dr. Ammie Dalton-  most recent regimen 5-FU and irinotecan ( started 09/08/21)  Patient had developed a pretty abrupt change in renal function from 12/14/21 to 12/29/21- from 1.2 to 2.49.  Unfortunately his renal function has continued to decline-  crt was over 3-  saw Dr. Joylene Grapes at [facility] yesterday-  he felt he might be exhibiting uremic signs and sxms. Urine reportedly showed some WBC-  prt/crt ration not really elevated.  Pt does report some LUTS sxms-  u/s in the past showed distended bladder and "fullness" but this finding was not reproducible on CT done later.  Pt is thin, not eating well, intermittent diarrhea-  labs of note today BUN and crt 42/4.43 , bicarb 9, k of 3.2, mag 1.3, WBC 17 K, hgb 8.7 ? ?Creatinine  ?Date/Time Value Ref Range Status  ?02/04/2022 09:35 AM 3.22 (HH) 0.61 - 1.24 mg/dL Final  ?  Comment:  ?  CRITICAL RESULT CALLED TO, READ BACK BY AND VERIFIED WITH: ?S COWARD,RN 1036 02/04/22 DBRADLEY ?  ?01/26/2022 08:25 AM 3.23 (HH) 0.61 - 1.24 mg/dL Final  ?  Comment:  ?  CRITICAL RESULT CALLED TO, READ BACK BY AND VERIFIED WITH: ?Alvera Singh 01/26/22 0911 MTV ?  ?01/21/2022 10:38 AM 3.34 (HH) 0.61 - 1.24 mg/dL Final  ?  Comment:  ?  CRITICAL RESULT CALLED TO, READ BACK BY AND VERIFIED WITH: ?CT SUSAN COWARD @ 1121. 01/21/22. KLJ ?  ?01/12/2022 09:50 AM 2.77 (H) 0.61 - 1.24 mg/dL Final  ?01/08/2022 11:20 AM 2.81 (H) 0.61 - 1.24 mg/dL Final  ?01/05/2022 08:20 AM 2.96 (H) 0.61 - 1.24 mg/dL Final  ?12/29/2021 10:00 AM 2.49 (H) 0.61 - 1.24 mg/dL Final  ?12/14/2021 11:50 AM 1.23 0.61 - 1.24 mg/dL Final  ?12/01/2021 10:17 AM 1.24 0.61 - 1.24 mg/dL Final   ?11/17/2021 08:35 AM 1.22 0.61 - 1.24 mg/dL Final  ?11/04/2021 08:05 AM 1.15 0.61 - 1.24 mg/dL Final  ?10/19/2021 02:03 PM 1.05 0.61 - 1.24 mg/dL Final  ?10/13/2021 07:58 AM 1.29 (H) 0.61 - 1.24 mg/dL Final  ?09/22/2021 08:09 AM 1.23 0.61 - 1.24 mg/dL Final  ?09/08/2021 08:04 AM 1.16 0.61 - 1.24 mg/dL Final  ?08/20/2021 09:25 AM 1.28 (H) 0.61 - 1.24 mg/dL Final  ?08/06/2021 08:49 AM 1.21 0.61 - 1.24 mg/dL Final  ?07/23/2021 07:55 AM 1.39 (H) 0.61 - 1.24 mg/dL Final  ?07/10/2021 08:05 AM 1.30 (H) 0.61 - 1.24 mg/dL Final  ?06/26/2021 08:10 AM 1.20 0.61 - 1.24 mg/dL Final  ?06/12/2021 07:59 AM 1.38 (H) 0.61 - 1.24 mg/dL Final  ?05/28/2021 09:45 AM 1.37 (H) 0.61 - 1.24 mg/dL Final  ?05/22/2021 09:08 AM 1.40 (H) 0.61 - 1.24 mg/dL Final  ?05/15/2021 08:04 AM 1.50 (H) 0.61 - 1.24 mg/dL Final  ?05/01/2021 08:04 AM 1.37 (H) 0.61 - 1.24 mg/dL Final  ?04/17/2021 10:06 AM 1.57 (H) 0.61 - 1.24 mg/dL Final  ?04/10/2021 09:33 AM 1.46 (H) 0.61 - 1.24 mg/dL Final  ?03/27/2021 08:27 AM 1.54 (H) 0.61 - 1.24 mg/dL Final  ?03/12/2021 08:52 AM 1.02 0.61 - 1.24 mg/dL Final  ?03/02/2021 12:06 PM 1.11 0.61 - 1.24 mg/dL Final  ?02/23/2021 09:59 AM 1.12 0.60 - 1.20 mg/dL Final  ?02/02/2021 09:13 AM  1.40 (H) 0.61 - 1.24 mg/dL Final  ?01/12/2021 08:35 AM 0.83 0.61 - 1.24 mg/dL Final  ?12/22/2020 10:10 AM 0.87 0.61 - 1.24 mg/dL Final  ?12/01/2020 11:12 AM 0.89 0.61 - 1.24 mg/dL Final  ?11/10/2020 09:04 AM 0.92 0.61 - 1.24 mg/dL Final  ?10/20/2020 12:00 PM 0.97 0.61 - 1.24 mg/dL Final  ?09/29/2020 07:53 AM 1.08 0.61 - 1.24 mg/dL Final  ?09/08/2020 01:30 PM 0.88 0.61 - 1.24 mg/dL Final  ?09/01/2020 10:46 AM 0.99 0.61 - 1.24 mg/dL Final  ?08/18/2020 08:37 AM 0.94 0.61 - 1.24 mg/dL Final  ?08/11/2020 09:22 AM 0.91 0.61 - 1.24 mg/dL Final  ?07/24/2020 03:40 PM 0.99 0.61 - 1.24 mg/dL Final  ? ?Creatinine, Ser  ?Date/Time Value Ref Range Status  ?02/09/2022 11:23 AM 4.43 (H) 0.61 - 1.24 mg/dL Final  ?02/06/2022 03:06 AM 3.41 (H) 0.61 - 1.24 mg/dL  Final  ?09/18/2021 05:44 AM 1.19 0.61 - 1.24 mg/dL Final  ?09/17/2021 04:56 AM 1.20 0.61 - 1.24 mg/dL Final  ?09/16/2021 04:35 AM 1.08 0.61 - 1.24 mg/dL Final  ?09/15/2021 01:41 PM 1.22 0.61 - 1.24 mg/dL Final  ?08/31/2021 10:30 AM 1.17 0.61 - 1.24 mg/dL Final  ?08/29/2021 09:52 AM 1.20 0.61 - 1.24 mg/dL Final  ?08/29/2021 06:54 AM 1.04 0.61 - 1.24 mg/dL Final  ?08/28/2021 04:07 AM 1.16 0.61 - 1.24 mg/dL Final  ?08/27/2021 09:26 AM 1.10 0.61 - 1.24 mg/dL Final  ?08/27/2021 06:19 AM 1.16 0.61 - 1.24 mg/dL Final  ?08/27/2021 02:51 AM 0.92 0.61 - 1.24 mg/dL Final  ? ? ? ?PMHx: ?  ?Past Medical History:  ?Diagnosis Date  ? Cancer [facility])   ? Diabetes mellitus without complication ([place])   ? Family history of breast cancer   ? Family history of colon cancer   ? Family history of pancreatic cancer   ? Family history of stomach cancer   ? ? ?Past Surgical History:  ?Procedure Laterality Date  ? BIOPSY  08/29/2021  ? Procedure: BIOPSY;  Surgeon: Otis Brace, MD;  [facility];  Service: Gastroenterology;;  ? COLONOSCOPY WITH PROPOFOL N/A 08/29/2021  ? Procedure: COLONOSCOPY WITH PROPOFOL;  Surgeon: Otis Brace, MD;  [facility];  Service: Gastroenterology;  Laterality: N/A;  ? ESOPHAGEAL BANDING  09/17/2021  ? Procedure: ESOPHAGEAL BANDING;  Surgeon: Otis Brace, MD;  [facility];  Service: Gastroenterology;;  ? ESOPHAGEAL BANDING N/A 11/03/2021  ? Procedure: ESOPHAGEAL BANDING;  Surgeon: Otis Brace, MD;  [facility];  Service: Gastroenterology;  Laterality: N/A;  ? ESOPHAGOGASTRODUODENOSCOPY (EGD) WITH PROPOFOL N/A 08/27/2021  ? Procedure: ESOPHAGOGASTRODUODENOSCOPY (EGD) WITH PROPOFOL;  Surgeon: Clarene Essex, MD;  [facility];  Service: Endoscopy;  Laterality: N/A;  ? ESOPHAGOGASTRODUODENOSCOPY (EGD) WITH PROPOFOL N/A 09/17/2021  ? Procedure: ESOPHAGOGASTRODUODENOSCOPY (EGD) WITH PROPOFOL;  Surgeon: Otis Brace, MD;  [facility];   Service: Gastroenterology;  Laterality: N/A;  ? ESOPHAGOGASTRODUODENOSCOPY (EGD) WITH PROPOFOL N/A 11/03/2021  ? Procedure: ESOPHAGOGASTRODUODENOSCOPY (EGD) WITH PROPOFOL;  Surgeon: Otis Brace, MD;  [facility];  Service: Gastroenterology;  Laterality: N/A;  ? ? ?Family Hx:  ?Family History  ?Problem Relation Age of Onset  ? Diabetes Mother   ? Pancreatic cancer Father   ?     d. 19  ? Breast cancer Sister 92  ? Diabetes Sister   ? Diabetes Brother   ? Stomach cancer Paternal Aunt   ?     2 pat aunts with stomach cancer  ? Colon cancer Paternal Uncle   ? Cancer Paternal Grandfather   ?  NOS  ? Diabetes Sister   ? Diabetes Brother   ? Brain cancer Paternal Aunt   ? Cancer Paternal Aunt   ?     eye  ? Diabetes Son   ? ? ?Social History:  reports that he quit smoking about 41 years ago. His smoking use included cigarettes. He has never used smokeless tobacco. He reports that he does not currently use alcohol. He reports that he does not use drugs. ? ?Allergies: No Known Allergies ? ?Medications: ?Prior to Admission medications   ?Medication Sig Start Date End Date Taking? Authorizing Provider  ?diphenoxylate-atropine (LOMOTIL) 2.5-0.025 MG tablet Take 1-2 tablets by mouth 4 (four) times daily as needed for diarrhea or loose stools. ?Patient taking differently: Take 1 tablet by mouth 4 (four) times daily as needed for diarrhea or loose stools. 12/14/21  Yes Owens Shark, NP  ?HYDROcodone-acetaminophen (NORCO/VICODIN) 5-325 MG tablet Take 1-2 tablets by mouth every 4 (four) hours as needed. ?Patient taking differently: Take 1 tablet by mouth every 4 (four) hours as needed. 01/04/22  Yes Owens Shark, NP  ?latanoprost (XALATAN) 0.005 % ophthalmic solution Place 1 drop into both eyes at bedtime.   Yes [provider]  ?lidocaine-prilocaine (EMLA) cream Apply 1 application topically as directed. Apply to port site 1 hour prior to stick and cover with plastic wrap ?Patient taking differently:  Apply 1 application. topically as needed (chemo port site). 10/19/21  Yes Owens Shark, NP  ?lipase/protease/amylase (CREON) 36000 UNITS CPEP capsule Take 2 capsules (72,000 Units total) by mouth 3 (three) times

## 2022-02-09 NOTE — Assessment & Plan Note (Signed)
Acute.  WBC elevated at 18.7.  Patient denies having any significant fevers. ?-Follow-up urinalysis ?

## 2022-02-09 NOTE — ED Provider Triage Note (Signed)
Emergency Medicine Provider Triage Evaluation Note ? ?Aaron Johnston , a 76 y.o. male  was evaluated in triage.  Pt complains of worsening fatigue, shortness of breath, sent here by his cancer doctor, he is dealing with pancreatic cancer, with concern of worsening kidney function.  I cannot see their recent lab work on file but patient's wife reports that recent creatinine had bumped to 4, I see that it had been creeping up from the mid twos.  Patient reports that he is still producing urine.  He denies any abdominal pain.  He denies any fever, chills.  He has been taking chemotherapy, he has not had any recent surgery. ? ?Review of Systems  ?Positive: Fatigue, shob, weight loss ?Negative: Chest pain, abdominal pain, fever, chills ? ?Physical Exam  ?There were no vitals taken for this visit. ?Gen:   Awake, no distress   ?Resp:  Normal effort  ?MSK:   Moves extremities without difficulty  ?Other:  No TTP abdomen ? ?Medical Decision Making  ?Medically screening exam initiated at 11:14 AM.  Appropriate orders placed.  Aaron Johnston was informed that the remainder of the evaluation will be completed by another provider, this initial triage assessment does not replace that evaluation, and the importance of remaining in the ED until their evaluation is complete. ? ?Workup initiated ?  ?Anselmo Pickler, PA-C ?02/09/22 1117 ? ?

## 2022-02-09 NOTE — Assessment & Plan Note (Signed)
Last hemoglobin A1c was 6.1 on 08/27/2021.  Home medication regimen includes Actos 30 mg daily. ?-Hold Actos ?

## 2022-02-10 ENCOUNTER — Encounter (HOSPITAL_COMMUNITY): Payer: Self-pay | Admitting: Internal Medicine

## 2022-02-10 DIAGNOSIS — N179 Acute kidney failure, unspecified: Secondary | ICD-10-CM | POA: Diagnosis not present

## 2022-02-10 DIAGNOSIS — N189 Chronic kidney disease, unspecified: Secondary | ICD-10-CM | POA: Diagnosis not present

## 2022-02-10 LAB — RENAL FUNCTION PANEL
Albumin: 2.7 g/dL — ABNORMAL LOW (ref 3.5–5.0)
Anion gap: 10 (ref 5–15)
BUN: 38 mg/dL — ABNORMAL HIGH (ref 8–23)
CO2: 12 mmol/L — ABNORMAL LOW (ref 22–32)
Calcium: 8.1 mg/dL — ABNORMAL LOW (ref 8.9–10.3)
Chloride: 120 mmol/L — ABNORMAL HIGH (ref 98–111)
Creatinine, Ser: 4.04 mg/dL — ABNORMAL HIGH (ref 0.61–1.24)
GFR, Estimated: 15 mL/min — ABNORMAL LOW (ref >60)
Glucose, Bld: 121 mg/dL — ABNORMAL HIGH (ref 70–99)
Phosphorus: 2.4 mg/dL — ABNORMAL LOW (ref 2.5–4.6)
Potassium: 2.8 mmol/L — ABNORMAL LOW (ref 3.5–5.1)
Sodium: 142 mmol/L (ref 135–145)

## 2022-02-10 LAB — CBC
HCT: 23.6 % — ABNORMAL LOW (ref 39.0–52.0)
Hemoglobin: 7.5 g/dL — ABNORMAL LOW (ref 13.0–17.0)
MCH: 31 pg (ref 26.0–34.0)
MCHC: 31.8 g/dL (ref 30.0–36.0)
MCV: 97.5 fL (ref 80.0–100.0)
Platelets: 188 10*3/uL (ref 150–400)
RBC: 2.42 MIL/uL — ABNORMAL LOW (ref 4.22–5.81)
RDW: 17.5 % — ABNORMAL HIGH (ref 11.5–15.5)
WBC: 8.9 10*3/uL (ref 4.0–10.5)
nRBC: 0 % (ref 0.0–0.2)

## 2022-02-10 LAB — MAGNESIUM: Magnesium: 1.5 mg/dL — ABNORMAL LOW (ref 1.7–2.4)

## 2022-02-10 MED ORDER — POTASSIUM CHLORIDE CRYS ER 20 MEQ PO TBCR: 40.0000 meq | EXTENDED_RELEASE_TABLET | Freq: Once | ORAL | Status: DC

## 2022-02-10 MED ORDER — CHLORHEXIDINE GLUCONATE CLOTH 2 % EX PADS
6.0000 | MEDICATED_PAD | Freq: Every day | CUTANEOUS | Status: DC
  Administered 2022-02-11 – 2022-02-16 (×6): 6 via TOPICAL

## 2022-02-10 MED ORDER — POTASSIUM CHLORIDE CRYS ER 20 MEQ PO TBCR
40.0000 meq | EXTENDED_RELEASE_TABLET | Freq: Three times a day (TID) | ORAL | Status: AC
  Administered 2022-02-10 – 2022-02-11 (×6): 40 meq via ORAL
  Filled 2022-02-10 (×6): qty 2

## 2022-02-10 MED ORDER — MAGNESIUM SULFATE 2 GM/50ML IV SOLN
2.0000 g | Freq: Once | INTRAVENOUS | Status: AC
  Administered 2022-02-10: 2 g via INTRAVENOUS

## 2022-02-10 MED ORDER — ENSURE ENLIVE PO LIQD
237.0000 mL | Freq: Two times a day (BID) | ORAL | Status: DC
  Administered 2022-02-11 – 2022-02-16 (×7): 237 mL via ORAL
  Filled 2022-02-10: qty 237

## 2022-02-10 MED ORDER — MAGNESIUM SULFATE 2 GM/50ML IV SOLN
2.0000 g | Freq: Once | INTRAVENOUS | Status: DC
  Filled 2022-02-10: qty 50

## 2022-02-10 NOTE — Progress Notes (Signed)
?  Transition of Care (TOC) Screening Note ? ? ?Patient Details  ?Name: Aaron Johnston ?Date of Birth: 01-12-1946 ? ? ?Transition of Care (TOC) CM/SW Contact:    ?Benard Halsted, LCSW ?Phone Number: ?02/10/2022, 3:21 PM ? ? ? ?Transition of Care Department [facility]) has reviewed patient and no TOC needs have been identified at this time. We will continue to monitor patient advancement through interdisciplinary progression rounds. If new patient transition needs arise, please place a TOC consult. ? ? ?

## 2022-02-10 NOTE — Telephone Encounter (Signed)
Currently in hospital. Will await discharge to determine if he needs to continue this medicaiton ?

## 2022-02-10 NOTE — Plan of Care (Signed)
?  Problem: Education: Goal: Knowledge of General Education information will improve Description: Including pain rating scale, medication(s)/side effects and non-pharmacologic comfort measures Outcome: Progressing   Problem: Health Behavior/Discharge Planning: Goal: Ability to manage health-related needs will improve Outcome: Progressing   Problem: Clinical Measurements: Goal: Ability to maintain clinical measurements within normal limits will improve Outcome: Progressing Goal: Will remain free from infection Outcome: Progressing Goal: Diagnostic test results will improve Outcome: Progressing Goal: Respiratory complications will improve Outcome: Progressing Goal: Cardiovascular complication will be avoided Outcome: Progressing   Problem: Nutrition: Goal: Adequate nutrition will be maintained Outcome: Progressing   

## 2022-02-10 NOTE — Progress Notes (Signed)
?Progress note ? ?Patient: Aaron Johnston YQI:[id number] DOB: [birth year] ?DOA: 02/09/2022 ?DOS: the patient was seen and examined on 02/10/2022 ?PCP: [name], [name], MD  ?Patient coming from: Home ? ?Chief Complaint: Decreased kidney ? ?HPI: Aaron Johnston is a 76 y.o. male with medical history significant of hypertension, hyperlipidemia, diabetes mellitus type 2, GI bleed secondary to esophageal varices status post banding, and pancreatic cancer diagnosed in 2021 followed by Dr. Learta Codding  on chemotherapy presents with reports of worsening kidney function after lab work obtained yesterday by his nephrologist Dr. Osborne Casco.  Patient's kidney function previously had been around 1.2 in January of this year, but since that time has slowly trended up from 2->3. He had an renal ultrasound 2/23 which noted mild renal pelvic fullness bilaterally thought to be secondary to distended bladder with enlarged prostate.  His wife is present at bedside and notes that over the last 2 to 3 weeks his appetite has definitely decreased.  Patient complains of things having a bad taste possibly related to to his chemotherapy.  Associated symptoms include decreased urine output, intermittent nosebleeds, weight loss of approximately 7 pounds (119 -> 112 ),wife reports issues with his memory, and intermittently being confused.  Denies having any fever, cough, or blood in his stool/urine.  He reports still having bowel movements and being able to pass flatus.  He had just received infusions of pegfilgrastim and romiplostim  on 3/16.  His last chemotherapy treatment fluorouracil, leucovorin, and irinotecan was on 3/14.  Lastly patient brought up these been having difficulty sleeping, but patient's wife notes that he sleeps throughout the day. ? ?Upon admission into the emergency department patient was noted to be afebrile with vital signs relatively stable.  WBC 17, hemoglobin 8.7, potassium 3.2, CO2 9, BUN 42, creatinine 4.43, and magnesium 1.3.   Chest x-ray noted no acute abnormality.  Neurology was formally consulted.  Patient had been given 500 mL bolus of IV fluids, potassium chloride 40 mEq, magnesium sulfate 2 g IV, and started on sodium bicarb drip at 100 mL/h. ? ?Subjective ?Patient feeling okay.  Urinating very well. ? ?Exam ? ?Constitutional: Chronically ill-appearing elderly male currently in no acute ?Eyes: PERRL, lids and conjunctivae normal ?ENMT: Mucous membranes are moist. Posterior pharynx clear of any exudate or lesions.   ?Neck: normal, supple, no masses, no thyromegaly ?Respiratory: clear to auscultation bilaterally, no wheezing, no crackles. Normal respiratory effort. No accessory muscle use.  ?Cardiovascular: Regular rate and rhythm, no murmurs / rubs / gallops.  Trace bilateral lower. 2+ pedal pulses. No carotid bruits.  ?Abdomen: no tenderness, no masses palpated. Bowel sounds positive.  ?Musculoskeletal: no clubbing / cyanosis. No joint deformity upper and lower extremities. Good ROM, no contractures. Normal muscle tone.  ?Skin: Poor skin turgor ?Neurologic: CN 2-12 grossly intact.  Able to move all extremities ?Psychiatric: Normal judgment and insight. Alert and oriented x 3. Normal mood.  ? ?Data Reviewed: ? ?Reviewed labs, imaging, and pertinent records as noted above in HPI ? ?Assessment and Plan: ?*Acute kidney injury superimposed on chronic kidney disease ([employer]) secondary to BPH with urinary obstruction ?Patient presented with notable worsening kidney function.  Creatinine 4.43 with BUN 43.  Patient's creatinine had been more within a normal range in January of this year and 1.2.  Renal ultrasound last month noted concern for pelvic fullness with distended bladder and enlarged prostate.  Being followed by Dr. Osborne Casco of nephrology. ?-Admit to a medical telemetry bed ?-Check urinalysis and renal ultrasound ?-Insert Foley catheter for Strict  I&Os and concern for bladder outlet obstruction. ?-Continue sodium bicarb drip at 100  mL/h ?-Check renal function panels daily ?-Adequate urinary output thus far ? ?Leukocytosis ?Acute.  WBC elevated at 18.7.  Patient denies having any significant fevers. ?-No significant infection identified ? ?Primary cancer of head of pancreas ([place]) ?Patient with pancreatic cancer at the head of the pancreas diagnosed in 2021 currently on chemotherapy followed by Dr. Benay Spice.  Last chemotherapy treatment fluorouracil, leucovorin, and irinotecan was on 3/14 ?-Dr. Learta Codding  added to the treatment team ? ?Hypokalemia ?Acute.  Monitor and replete ? ?Hypomagnesemia ?Acute.  Monitor and replete ? ?Normocytic anemia history of esophageal varices ?-No current active bleeding ?-Monitor ? ?Insomnia ?Patient complains of having difficulty sleeping at night. ?-Melatonin nightly ?-Low-dose Ativan p.o. if melatonin not helpful ? ?Controlled type 2 diabetes mellitus without complication, without long-term current use of insulin ([place]) ?Last hemoglobin A1c was 6.1 on 08/27/2021.  Home medication regimen includes Actos 30 mg daily. ?-Hold Actos ? ?GERD (gastroesophageal reflux disease) ?- Continue Protonix ? ?Continue IV fluids and monitoring renal function.  Will likely need several more days of IV fluid therapy. ? ?DVT prophylaxis: Heparin ?Advance Care Planning:   Code Status: Full Code  ? ?Consults: Nephrology ? ?Family Communication: Discussed plan of care with the patient's wife present at bedside ? ? ?Author: ?Phillips Grout, MD ?02/10/2022 3:33 PM ? ?

## 2022-02-10 NOTE — Progress Notes (Signed)
Subjective:  UOP has not been recorded-  wife says they have emptied foley a couple of times- BUN and crt down a little-  K is down -  bicarb up but still abnormal -  eating spaghetti-  says maybe feels better ?Objective ?Vital signs in last 24 hours: ?Vitals:  ? 02/10/22 0644 02/10/22 0646 02/10/22 1045 02/10/22 1100  ?BP: (!) 103/45 109/63 117/63 (!) 108/53  ?Pulse: 92 84 91 94  ?Resp: '16 14 14 17  '$ ?Temp:      ?TempSrc:      ?SpO2: 98% 100% 100% 99%  ? ?Weight change:  ? ?Intake/Output Summary (Last 24 hours) at 02/10/2022 1246 ?Last data filed at 02/10/2022 1045 ?Gross per 24 hour  ?Intake 1325 ml  ?Output --  ?Net 1325 ml  ? ? ? ?Assessment/Plan: 76 year old BM with DM but most importantly recent acute/subacute renal failure since 12/14/21 of unknown etiology but is progressive ?1.Renal- subacute renal failure since 1/30-  etiology unclear.  U/A negative.  ATN is in the differential given his chronic diarrhea and low BP here today -   ordered hydration in the form of bicarb based fluids.  Renal failure is listed as reaction to Irinotecan but dont know the mechanism and he did start it in late October and the renal failure didn't happen until February-  his last infusion was on 3/14.  Could he have HRS from the mets in his liver ?  urine sodium is not low.   Possibly he has BOO given LUTS and finding in the past of fullness- s/p foley.  Unfortunately patient would not be a candidate for a really aggressive work up with renal biopsy or any further IS drugs.   I really hope that we can turn this around because would  not be a candidate for  dialysis given his advanced cancer ?2. Hypertension/volume  - seems dry-  gave a  liter of NS - now bicarb based fluids at 100 per hour ?3. Elytes-  giving magnesium iv and oral -  gave 40 meq of k dur today as well-  ordered more mag today and kdur total of 120 for the day ?4. Anemia  - cancer and renal failure- ESA would be contraindicated... transfusion ?  ?5. Metastatic  pancreatic CA-  Dr. Joylene Grapes talked to Dr. Ammie Dalton -  is concerned that this signifies the end of his journey  ?  ? ? ?Ines Meckel  ? ? ?Labs: ?Basic Metabolic Panel: ?Recent Labs  ?Lab 02/06/22 ?0306 02/09/22 ?1123 02/10/22 ?0425  ?NA 137 141 142  ?K 3.2* 3.2* 2.8*  ?CL 114* 121* 120*  ?CO2 10* 9* 12*  ?GLUCOSE 108* 120* 121*  ?BUN 57* 42* 38*  ?CREATININE 3.41* 4.43* 4.04*  ?CALCIUM 8.5* 8.5* 8.1*  ?PHOS  --   --  2.4*  ? ?Liver Function Tests: ?Recent Labs  ?Lab 02/06/22 ?0306 02/10/22 ?0425  ?AST 15  --   ?ALT 25  --   ?ALKPHOS 254*  --   ?BILITOT 1.1  --   ?PROT 6.1*  --   ?ALBUMIN 3.4* 2.7*  ? ?Recent Labs  ?Lab 02/06/22 ?0306  ?LIPASE <10*  ? ?No results for input(s): AMMONIA in the last 168 hours. ?CBC: ?Recent Labs  ?Lab 02/04/22 ?6195 02/06/22 ?0932 02/09/22 ?1123 02/10/22 ?0425  ?WBC 2.5* 1.3* 17.0* 8.9  ?NEUTROABS 2.0 0.6* 15.0*  --   ?HGB 8.5* 7.2* 8.7* 7.5*  ?HCT 26.5* 22.3* 27.1* 23.6*  ?MCV 96.4 95.3 98.9  97.5  ?PLT 67* 62* 237 188  ? ?Cardiac Enzymes: ?No results for input(s): CKTOTAL, CKMB, CKMBINDEX, TROPONINI in the last 168 hours. ?CBG: ?Recent Labs  ?Lab 02/09/22 ?1122  ?GLUCAP 104*  ? ? ?Iron Studies: No results for input(s): IRON, TIBC, TRANSFERRIN, FERRITIN in the last 72 hours. ?Studies/Results: ?DG Chest 2 View ? ?Result Date: 02/09/2022 ?CLINICAL DATA:  Shortness of breath. Fatigue. Pancreatic carcinoma. EXAM: CHEST - 2 VIEW COMPARISON:  AP chest 02/06/2022 FINDINGS: Left chest wall porta catheter is seen with tip overlying the superior aspect of the right atrium, unchanged. Cardiac silhouette and mediastinal contours are within normal limits. Mild bilateral interstitial thickening, unchanged from prior and chronic. No pleural effusion or pneumothorax. Mild anterior height loss of the T7 vertebral body is unchanged from 02/06/2022 CT and chronic. The posterior left T3 vertebral body sclerotic lesion seen on multiple prior CTs including 08/26/2021 is again less well visualized on  radiographs. IMPRESSION: No active cardiopulmonary disease. Electronically Signed   By: Yvonne Kendall M.D.   On: 02/09/2022 11:44  ? ?US RENAL ? ?Result Date: 02/09/2022 ?CLINICAL DATA:  Acute renal failure.  History of pancreatic cancer. EXAM: RENAL / URINARY TRACT ULTRASOUND COMPLETE COMPARISON:  CT chest abdomen pelvis 02/06/2022 FINDINGS: Right Kidney: Renal measurements: 10.3 x 5.6 x 5.7 cm = volume: 157 mL. 17 mm right upper pole cyst. Increased echogenicity of the right renal cortex. Mild hydronephrosis. Left Kidney: Renal measurements: 10.4 x 5.3 x 4.7 cm = volume: 135 mL. Increased echogenicity of the left kidney. Mild left hydronephrosis. Bladder: Appears normal for degree of bladder distention. Prostate enlargement.  Prostate volume 45.6 cc. Other: Splenomegaly. Spleen splenic length 15.3 cm. Hypoechoic lesion in the spleen measuring 3 x 4 cm as noted on CT Ascites IMPRESSION: 1. Increased echogenicity of the renal cortex bilaterally. Mild bilateral hydronephrosis. 2. Prostate enlargement 3. Splenomegaly with 3 x 4 cm splenic lesion 4. Ascites Electronically Signed   By: Franchot Gallo M.D.   On: 02/09/2022 17:54   ?Medications: ?Infusions: ? magnesium sulfate bolus IVPB    ? sodium bicarbonate 150 mEq in D5W infusion 100 mL/hr at 02/10/22 0419  ? ? ?Scheduled Medications: ? heparin  5,000 Units Subcutaneous Q8H  ? latanoprost  1 drop Both Eyes QHS  ? lipase/protease/amylase  72,000 Units Oral TID WC  ? loratadine  10 mg Oral Daily  ? magnesium oxide  400 mg Oral BID  ? melatonin  3 mg Oral QHS  ? pantoprazole  40 mg Oral BID  ? potassium chloride  40 mEq Oral TID  ? ? have reviewed scheduled and prn medications. ? ?Physical Exam: ?General: thin but alert and oriented-  NAD-  eating lunch ?Heart: RRR ?Lungs: mostly clear ?Abdomen: distended ?Extremities: trace edema ? ? ? ?02/10/2022,12:46 PM ? LOS: 1 day  ? ?  ? ? ? ? ?

## 2022-02-11 ENCOUNTER — Inpatient Hospital Stay: Payer: Medicare Other

## 2022-02-11 DIAGNOSIS — N179 Acute kidney failure, unspecified: Secondary | ICD-10-CM | POA: Diagnosis not present

## 2022-02-11 DIAGNOSIS — N189 Chronic kidney disease, unspecified: Secondary | ICD-10-CM | POA: Diagnosis not present

## 2022-02-11 LAB — RENAL FUNCTION PANEL
Albumin: 2.8 g/dL — ABNORMAL LOW (ref 3.5–5.0)
Anion gap: 12 (ref 5–15)
BUN: 30 mg/dL — ABNORMAL HIGH (ref 8–23)
CO2: 17 mmol/L — ABNORMAL LOW (ref 22–32)
Calcium: 8.4 mg/dL — ABNORMAL LOW (ref 8.9–10.3)
Chloride: 112 mmol/L — ABNORMAL HIGH (ref 98–111)
Creatinine, Ser: 4 mg/dL — ABNORMAL HIGH (ref 0.61–1.24)
GFR, Estimated: 15 mL/min — ABNORMAL LOW (ref >60)
Glucose, Bld: 142 mg/dL — ABNORMAL HIGH (ref 70–99)
Phosphorus: 1.5 mg/dL — ABNORMAL LOW (ref 2.5–4.6)
Potassium: 3.3 mmol/L — ABNORMAL LOW (ref 3.5–5.1)
Sodium: 141 mmol/L (ref 135–145)

## 2022-02-11 LAB — MAGNESIUM: Magnesium: 1.9 mg/dL (ref 1.7–2.4)

## 2022-02-11 MED ORDER — ADULT MULTIVITAMIN W/MINERALS CH
1.0000 | ORAL_TABLET | Freq: Every day | ORAL | Status: DC
  Administered 2022-02-11 – 2022-02-16 (×5): 1 via ORAL
  Filled 2022-02-11 (×5): qty 1

## 2022-02-11 NOTE — Progress Notes (Signed)
Initial Nutrition Assessment ? ?DOCUMENTATION CODES:  ? ?Not applicable ? ?INTERVENTION:  ? ?Magic cup BID with meals, each supplement provides 290 kcal and 9 grams of protein ?Ensure Enlive po BID, each supplement provides 350 kcal and 20 grams of protein. ?Multivitamin w/ minerals daily ?Liberalize pt diet to regular due to ongoing poor PO intake and history of malnutrition.  ?Encourage good PO intake ?Meal ordering with assist ? ?NUTRITION DIAGNOSIS:  ? ?Increased nutrient needs related to cancer and cancer related treatments as evidenced by estimated needs. ? ?GOAL:  ? ?Patient will meet greater than or equal to 90% of their needs ? ?MONITOR:  ? ?PO intake, Supplement acceptance, Labs, Weight trends ? ?REASON FOR ASSESSMENT:  ? ?Malnutrition Screening Tool ?  ? ?ASSESSMENT:  ? ?76 y.o. male presented to the ED with worsening kidney function. PMH includes T2DM, HTN, GERD, esophageal varices, and pancreatic cancer, currently undergoing chemotherapy. Pt admitted with AKI with urinary obstruction.  ? ?Pt resting at initial part of visit, covered in chair by window. Pt woke briefly and then went back to sleep. Wife at bedside and provides history. ? ?Wife reports that pt is eating, but his portions have decreased significantly. Reporting that he ate about 1/4 of his omelette this morning. Reports that he was having some nausea and vomiting in the morning at home, but wife would give him medications and then he would not have any N/V the rest of the day. Pt denies any changes in his taste 2/2 to cancer treatment. Wife reports that the pt does not like any of the supplements for variety of reasons, but she makes him drink them especially if not eating well.  ? ?Discussed alternative supplements that we can offer while inpatient. Pt and wife willing to try. Discussed leaving Ensure's ordered as is and pt drinking as willing to. Wife asking about use of plant based protein powders made with unsweetened almond milk;  reviewed that they will provide pt with protein but are not complete, balanced shakes like an Ensure would be. Wife understood.  ? ?Wife reports that pt was 118# at last oncology visit and then was 112# last week at the nephrologist office. Per EMR, pt has had a 13.3 kg weight gain within 2 weeks.  ? ?Pt wife with no other questions at time of visit. RD encouraged to follow-up with cancer center RD at next appointment.  ? ?Medications reviewed and include: Creon, Magnesium Oxide, Protonix, Potassium Chloride ?Labs reviewed: Potassium 3.3, BUN 30, Creatinine 4.0, Phosphorus 1.5 ? ?NUTRITION - FOCUSED PHYSICAL EXAM: ? ?Deferred due to pt sleeping and position.  ? ?Diet Order:   ?Diet Order   ? ?       ?  Diet Carb Modified Fluid consistency: Thin; Room service appropriate? Yes  Diet effective now       ?  ? ?  ?  ? ?  ? ? ?EDUCATION NEEDS:  ? ?No education needs have been identified at this time ? ?Skin:  Skin Assessment: Reviewed RN Assessment ? ?Last BM:  3/29 ? ?Height:  ? ?Ht Readings from Last 1 Encounters:  ?02/10/22 5' 9.02" (1.753 m)  ? ? ?Weight:  ? ?Wt Readings from Last 1 Encounters:  ?02/10/22 67.5 kg  ? ? ?Ideal Body Weight:  72.7 kg ? ?BMI:  Body mass index is 21.97 kg/m?. ? ?Estimated Nutritional Needs:  ? ?Kcal:  1800-2000 ? ?Protein:  90-105 grams ? ?Fluid:  >/= 1.8 L ? ? ? ?Hermina Barters  RD, LDN ?Clinical Dietitian ?See AMiON for contact information.  ? ?

## 2022-02-11 NOTE — Progress Notes (Signed)
IP PROGRESS NOTE ? ?Subjective:  ? ?Mr. Woodcox was last treated with 5-FU and liposomal irinotecan on 01/26/2022.  He was admitted 02/09/2022 for evaluation and management of progressive renal failure.  He has no complaint today.  He denies pain. ? ?He is receiving intravenous hydration.  He is followed by the nephrology service. ?Objective: ?Vital signs in last 24 hours: ?Blood pressure (!) 149/69, pulse (!) 109, temperature 98.2 ?F (36.8 ?C), temperature source Oral, resp. rate 14, height 5' 9.02" (1.753 m), weight 148 lb 13 oz (67.5 kg), SpO2 100 %. ? ?Intake/Output from previous day: ?03/29 0701 - 03/30 0700 ?In: 3826 [I.V.:3004.2; IV Piggyback:46.8] ?Out: 900 [Urine:900] ? ?Physical Exam: ? ?HEENT: No thrush ?Lungs: Clear bilaterally ?Cardiac: Regular rate and rhythm ?Abdomen: Nontender, no hepatosplenomegaly, no mass ?Extremities: No leg edema ? ? ?Portacath/PICC-without erythema ? ?Lab Results: ?Recent Labs  ?  02/09/22 ?1123 02/10/22 ?0425  ?WBC 17.0* 8.9  ?HGB 8.7* 7.5*  ?HCT 27.1* 23.6*  ?PLT 237 188  ? ? ?BMET ?Recent Labs  ?  02/10/22 ?0425 02/11/22 ?0256  ?NA 142 141  ?K 2.8* 3.3*  ?CL 120* 112*  ?CO2 12* 17*  ?GLUCOSE 121* 142*  ?BUN 38* 30*  ?CREATININE 4.04* 4.00*  ?CALCIUM 8.1* 8.4*  ? ? ?Lab Results  ?Component Value Date  ? VQQ595 6,387 (H) 01/26/2022  ? ? ?Studies/Results: ?US RENAL ? ?Result Date: 02/09/2022 ?CLINICAL DATA:  Acute renal failure.  History of pancreatic cancer. EXAM: RENAL / URINARY TRACT ULTRASOUND COMPLETE COMPARISON:  CT chest abdomen pelvis 02/06/2022 FINDINGS: Right Kidney: Renal measurements: 10.3 x 5.6 x 5.7 cm = volume: 157 mL. 17 mm right upper pole cyst. Increased echogenicity of the right renal cortex. Mild hydronephrosis. Left Kidney: Renal measurements: 10.4 x 5.3 x 4.7 cm = volume: 135 mL. Increased echogenicity of the left kidney. Mild left hydronephrosis. Bladder: Appears normal for degree of bladder distention. Prostate enlargement.  Prostate volume 45.6 cc. Other:  Splenomegaly. Spleen splenic length 15.3 cm. Hypoechoic lesion in the spleen measuring 3 x 4 cm as noted on CT Ascites IMPRESSION: 1. Increased echogenicity of the renal cortex bilaterally. Mild bilateral hydronephrosis. 2. Prostate enlargement 3. Splenomegaly with 3 x 4 cm splenic lesion 4. Ascites Electronically Signed   By: Franchot Gallo M.D.   On: 02/09/2022 17:54   ? ?Medications: I have reviewed the patient's current medications. ? ?Assessment/Plan: ? ?Pancreas cancer ?CT urology center 03/04/2020-haziness of the fat adjacent to the celiac axis and SMA with focal narrowing of the SMV, splenic vein, and splenoportal confluence ?CT 07/07/2020-infiltrative retroperitoneal mass with encasement of the celiac axis and SMA, high-grade narrowing of the proximal portal vein with cavernous transformation, nonocclusive thrombus within the main portal vein, mild biliary ductal dilatation, diffuse hypodense/hypoenhancing areas within the liver (edema versus infiltrating neoplasm), 2 x 1.7 cm area of focal prominence in the pancreas ?EUS 07/10/2020-no pancreas mass identified, 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, 1.5 cm aortocaval node biopsy-scant lymphoid material, no malignancy ?Diagnostic laparoscopy 07/17/2020-segment 3 and 4 liver nodules, adenocarcinoma, positive for pankeratin, CK7 and CK20, partially positive for TTF-1, negative for CDX2 ?Foundation 1-microsatellite stable, tumor mutation burden 4, K-ras G12D, subclonal RB1 alteration ?Elevated CA 19-9 ?Cycle 1 FOLFOX 07/28/2020 ?Cycle 2 FOLFOX 08/18/2020, oxaliplatin dose reduced due to thrombocytopenia ?Cycle 3 FOLFOX 09/08/2020 ?Cycle 4 FOLFOX 09/29/2020  ?Cycle 5 FOLFOX 10/20/2020 ?CTs 11/06/2020-grossly stable infiltrative mass within the pancreatic head and porta hepatis.  New small low-density liver lesions.  Large area  of ill-defined decreased density centrally in the liver on the immediate postcontrast images is attributed to the portal vein  thrombosis and/or radiation. ?Cycle 6 FOLFOX 11/10/2021  ?Cycle 7 FOLFOX 12/01/2020 ?MRI liver 12/04/2020-no significant change in posttreatment appearance of the pancreatic head.  Numerous intrinsically low signal hypoenhancing lesions of the liver parenchyma predominantly observed in the right lobe of the liver and liver dome, measuring no greater than 8 mm and as seen on recent prior CT of the abdomen/pelvis. ?Cycle 8 FOLFOX 12/22/2020 ?Cycle 9 FOLFOX 01/12/2021 ?Cycle 10 FOLFOX 02/02/2021 ?MRI liver 02/19/2021-no change in pancreas head mass, multiple hypoenhancing liver lesions are new and increased in size, effacement of portal vein by pancreas head mass with cavernous transformation, no change in mild splenomegaly ?Cycle 1 gemcitabine/Abraxane 03/12/2021 ?Cycle 2 gemcitabine/Abraxane 03/27/2021- dose reductions secondary to neutropenia ?Cycle 3 gemcitabine/Abraxane 04/17/2021-dose reductions due to thrombocytopenia, white cell growth factor support added ?Cycle 4 gemcitabine/Abraxane 05/01/2021, Ziextenzo ?Cycle 5 gemcitabine/Abraxane 05/15/2021, Ziextenzo ?MRI abdomen 05/26/2021-mild enlargement of dominant hepatic metastases, appearance of infiltrative pancreas mass extending to the celiac trunk and SMA, splenic vein occlusion, cavernous transformation of the portal vein with upper abdomen collaterals, splenomegaly, mild upper abdominal ascites and mesenteric edema, minimal subpleural nodularity left lower lobe ?Cycle 6 gemcitabine/Abraxane 05/28/2021 ?Cycle 7 gemcitabine/Abraxane 06/12/2021 ?Cycle 8 gemcitabine/Abraxane 06/26/2021 ?Cycle 9 gemcitabine/Abraxane 07/10/2021 ?Cycle 10 gemcitabine/Abraxane 07/23/2021 ?Cycle 11 gemcitabine/Abraxane 08/06/2021 ?MRI abdomen 08/20/2021-multifocal nodularity in the lower chest with a new discrete right lower lobe nodule, enlarging subsegment 7/8 liver lesion, other small lesions appear stable, gastric varices, review of images with radiology-enlargement of at least 2 liver lesions,  suspicious nodule n the right lower lung ?Cycle 1  5-FU/liposomal irinotecan 09/08/2021 ?Cycle 2  5-FU/liposomal irinotecan 09/22/2021 ?Cycle 3  5-FU/liposomal irinotecan 10/20/2021 ?Cycle 4 5-FU/liposomal irinotecan 11/04/2021 ?Cycle 5 5-FU/liposomal irinotecan 11/17/2021 ?MRI abdomen 11/27/2021-generally unchanged appearance of the pancreas with a very ill-defined hypoenhancing mass centered within the pancreatic neck, again appears to encase the celiac axis and encasing occlude the superior mesenteric vein and splenic vein; interval enlargement of multiple liver lesions; numerous lung nodules in the bilateral lung bases; unchanged splenomegaly; multiple new heterogeneously and hypoenhancing subcapsular lesions of the spleen most consistent with infarctions; small volume ascites throughout the abdomen; distended, debris-filled stomach suggesting some degree of gastric outlet and/or duodenal obstruction. ?Cycle 6 5-FU/liposomal irinotecan 12/01/2021 ?Cycle 7 5-FU/liposomal irinotecan 12/14/2021 ?Cycle 8 5-FU/liposomal irinotecan 01/12/2022 ?Cycle 9 5-FU/liposomal irinotecan 01/26/2022 ?  ?Chronic thrombocytopenia- weekly Nplate beginning 06/25/9146 ?Diabetes ?Hypertension ?Abdomen/back pain secondary to #1 ?Oxaliplatin neuropathy-moderate loss of vibratory sense on exam 01/12/2021, 02/02/2021 ?Neutropenia following gemcitabine/Abraxane chemotherapy-G-CSF added beginning with cycle 3 ?8.   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   Admission with recurrent GI bleeding and severe anemia 09/15/2021 ?Upper endoscopy 09/17/2021-esophageal varices with stigmata of recent bleeding, banded.  Portal hypertensive gastropathy, isolated gastric varices  without bleeding ?Upper endoscopy 11/03/2021-esophageal varices with no bleeding, eradicated with banding, type I isolated gastric varices without bleeding, portal hypertensive gastropathy ?10.  Influenza A 10/14/19

## 2022-02-11 NOTE — Progress Notes (Signed)
Subjective:  UOP at least 900. - BUN and crt down a little-  K is better -  bicarb up-   ? ?Objective ?Vital signs in last 24 hours: ?Vitals:  ? 02/11/22 0000 02/11/22 0400 02/11/22 0401 02/11/22 0841  ?BP: 122/61 115/68  (!) 149/69  ?Pulse: 96 (!) 108  (!) 109  ?Resp: '16 13 15 14  '$ ?Temp: 98.3 ?F (36.8 ?C) 98.4 ?F (36.9 ?C)  98.2 ?F (36.8 ?C)  ?TempSrc: Axillary Oral  Oral  ?SpO2: 99% 99%  100%  ?Weight:      ?Height:      ? ?Weight change:  ? ?Intake/Output Summary (Last 24 hours) at 02/11/2022 1134 ?Last data filed at 02/11/2022 0441 ?Gross per 24 hour  ?Intake 3051.03 ml  ?Output 900 ml  ?Net 2151.03 ml  ? ? ? ?Assessment/Plan: 76 year old BM with DM but most importantly recent acute/subacute renal failure since 12/14/21 of unknown etiology but is progressive ?1.Renal- subacute renal failure since 1/30-  etiology unclear.  U/A negative.  ATN is in the differential given his chronic diarrhea and low BP here today -   ordered hydration in the form of bicarb based fluids-  BP much better.  Renal failure is listed as reaction to Irinotecan but dont know the mechanism and he did start it in late October and the renal failure didn't happen until February-  his last infusion was on 3/14.  Could he have HRS from the mets in his liver ?  urine sodium is not low.   Possibly he has BOO given LUTS and finding in the past of fullness- s/p foley.  Unfortunately patient would not be a candidate for a really aggressive work up with renal biopsy or any further IS drugs.   Also  would  not be a candidate for  dialysis given his advanced cancer.  I would like to give him another 24 hours of fluids please and he probably needs to keep the foley in at discharge with follow up with urology given the clinical scenario   ?2. Hypertension/volume  - seems dry-  gave a  liter of NS - now bicarb based fluids at 100 per hour ?3. Elytes-  giving magnesium iv and oral -  mag is OK and K better-  another kdur total of 120 for the day ?4. Anemia  -  cancer and renal failure- ESA would be contraindicated... transfusion ?  ?5. Metastatic pancreatic CA-  Dr. Joylene Grapes talked to Dr. Benay Spice-  is concerned that this signifies the end of his journey  ?  ? ? ?Joziyah Meckel  ? ? ?Labs: ?Basic Metabolic Panel: ?Recent Labs  ?Lab 02/09/22 ?1123 02/10/22 ?0425 02/11/22 ?0256  ?NA 141 142 141  ?K 3.2* 2.8* 3.3*  ?CL 121* 120* 112*  ?CO2 9* 12* 17*  ?GLUCOSE 120* 121* 142*  ?BUN 42* 38* 30*  ?CREATININE 4.43* 4.04* 4.00*  ?CALCIUM 8.5* 8.1* 8.4*  ?PHOS  --  2.4* 1.5*  ? ?Liver Function Tests: ?Recent Labs  ?Lab 02/06/22 ?0306 02/10/22 ?0425 02/11/22 ?0256  ?AST 15  --   --   ?ALT 25  --   --   ?ALKPHOS 254*  --   --   ?BILITOT 1.1  --   --   ?PROT 6.1*  --   --   ?ALBUMIN 3.4* 2.7* 2.8*  ? ?Recent Labs  ?Lab 02/06/22 ?0306  ?LIPASE <10*  ? ?No results for input(s): AMMONIA in the last 168 hours. ?CBC: ?Recent  Labs  ?Lab 02/06/22 ?0306 02/09/22 ?1123 02/10/22 ?0425  ?WBC 1.3* 17.0* 8.9  ?NEUTROABS 0.6* 15.0*  --   ?HGB 7.2* 8.7* 7.5*  ?HCT 22.3* 27.1* 23.6*  ?MCV 95.3 98.9 97.5  ?PLT 62* 237 188  ? ?Cardiac Enzymes: ?No results for input(s): CKTOTAL, CKMB, CKMBINDEX, TROPONINI in the last 168 hours. ?CBG: ?Recent Labs  ?Lab 02/09/22 ?1122  ?GLUCAP 104*  ? ? ?Iron Studies: No results for input(s): IRON, TIBC, TRANSFERRIN, FERRITIN in the last 72 hours. ?Studies/Results: ?DG Chest 2 View ? ?Result Date: 02/09/2022 ?CLINICAL DATA:  Shortness of breath. Fatigue. Pancreatic carcinoma. EXAM: CHEST - 2 VIEW COMPARISON:  AP chest 02/06/2022 FINDINGS: Left chest wall porta catheter is seen with tip overlying the superior aspect of the right atrium, unchanged. Cardiac silhouette and mediastinal contours are within normal limits. Mild bilateral interstitial thickening, unchanged from prior and chronic. No pleural effusion or pneumothorax. Mild anterior height loss of the T7 vertebral body is unchanged from 02/06/2022 CT and chronic. The posterior left T3 vertebral body sclerotic  lesion seen on multiple prior CTs including 08/26/2021 is again less well visualized on radiographs. IMPRESSION: No active cardiopulmonary disease. Electronically Signed   By: Yvonne Kendall M.D.   On: 02/09/2022 11:44  ? ?US RENAL ? ?Result Date: 02/09/2022 ?CLINICAL DATA:  Acute renal failure.  History of pancreatic cancer. EXAM: RENAL / URINARY TRACT ULTRASOUND COMPLETE COMPARISON:  CT chest abdomen pelvis 02/06/2022 FINDINGS: Right Kidney: Renal measurements: 10.3 x 5.6 x 5.7 cm = volume: 157 mL. 17 mm right upper pole cyst. Increased echogenicity of the right renal cortex. Mild hydronephrosis. Left Kidney: Renal measurements: 10.4 x 5.3 x 4.7 cm = volume: 135 mL. Increased echogenicity of the left kidney. Mild left hydronephrosis. Bladder: Appears normal for degree of bladder distention. Prostate enlargement.  Prostate volume 45.6 cc. Other: Splenomegaly. Spleen splenic length 15.3 cm. Hypoechoic lesion in the spleen measuring 3 x 4 cm as noted on CT Ascites IMPRESSION: 1. Increased echogenicity of the renal cortex bilaterally. Mild bilateral hydronephrosis. 2. Prostate enlargement 3. Splenomegaly with 3 x 4 cm splenic lesion 4. Ascites Electronically Signed   By: Franchot Gallo M.D.   On: 02/09/2022 17:54   ?Medications: ?Infusions: ? sodium bicarbonate 150 mEq in D5W infusion 100 mL/hr at 02/11/22 1041  ? ? ?Scheduled Medications: ? Chlorhexidine Gluconate Cloth  6 each Topical Daily  ? feeding supplement  237 mL Oral BID BM  ? heparin  5,000 Units Subcutaneous Q8H  ? latanoprost  1 drop Both Eyes QHS  ? lipase/protease/amylase  72,000 Units Oral TID WC  ? loratadine  10 mg Oral Daily  ? magnesium oxide  400 mg Oral BID  ? melatonin  3 mg Oral QHS  ? pantoprazole  40 mg Oral BID  ? potassium chloride  40 mEq Oral TID  ? ? have reviewed scheduled and prn medications. ? ?Physical Exam: ?General: thin but alert and oriented-  NAD-  eating lunch ?Heart: RRR ?Lungs: mostly clear ?Abdomen: distended ?Extremities: no  edema ? ? ? ?02/11/2022,11:34 AM ? LOS: 2 days  ? ?  ? ? ? ? ?

## 2022-02-11 NOTE — Progress Notes (Signed)
?Progress note ? ?Patient: Aaron Johnston FKC:[id number] DOB: 1946/03/05 ?DOA: 02/09/2022 ?DOS: the patient was seen and examined on 02/11/2022 ?PCP: [name], [name], MD  ?Patient coming from: Home ? ?Chief Complaint: Decreased kidney ? ?HPI: Aaron Johnston is a 76 y.o. male with medical history significant of hypertension, hyperlipidemia, diabetes mellitus type 2, GI bleed secondary to esophageal varices status post banding, and pancreatic cancer diagnosed in 2021 followed by Dr. Learta Codding  on chemotherapy presents with reports of worsening kidney function after lab work obtained yesterday by his nephrologist Dr. Osborne Casco.  Patient's kidney function previously had been around 1.2 in January of this year, but since that time has slowly trended up from 2->3. He had an renal ultrasound 2/23 which noted mild renal pelvic fullness bilaterally thought to be secondary to distended bladder with enlarged prostate.  His wife is present at bedside and notes that over the last 2 to 3 weeks his appetite has definitely decreased.  Patient complains of things having a bad taste possibly related to to his chemotherapy.  Associated symptoms include decreased urine output, intermittent nosebleeds, weight loss of approximately 7 pounds (119 -> 112 ),wife reports issues with his memory, and intermittently being confused.  Denies having any fever, cough, or blood in his stool/urine.  He reports still having bowel movements and being able to pass flatus.  He had just received infusions of pegfilgrastim and romiplostim  on 3/16.  His last chemotherapy treatment fluorouracil, leucovorin, and irinotecan was on 3/14.  Lastly patient brought up these been having difficulty sleeping, but patient's wife notes that he sleeps throughout the day. ? ?Upon admission into the emergency department patient was noted to be afebrile with vital signs relatively stable.  WBC 17, hemoglobin 8.7, potassium 3.2, CO2 9, BUN 42, creatinine 4.43, and magnesium 1.3.   Chest x-ray noted no acute abnormality.  Neurology was formally consulted.  Patient had been given 500 mL bolus of IV fluids, potassium chloride 40 mEq, magnesium sulfate 2 g IV, and started on sodium bicarb drip at 100 mL/h. ? ?Subjective ?Feeling better and eating well ? ?Exam ? ?Constitutional: Chronically ill-appearing elderly male currently in no acute ?Eyes: PERRL, lids and conjunctivae normal ?ENMT: Mucous membranes are moist. Posterior pharynx clear of any exudate or lesions.   ?Neck: normal, supple, no masses, no thyromegaly ?Respiratory: clear to auscultation bilaterally, no wheezing, no crackles. Normal respiratory effort. No accessory muscle use.  ?Cardiovascular: Regular rate and rhythm, no murmurs / rubs / gallops.  Trace bilateral lower. 2+ pedal pulses. No carotid bruits.  ?Abdomen: no tenderness, no masses palpated. Bowel sounds positive.  ?Musculoskeletal: no clubbing / cyanosis. No joint deformity upper and lower extremities. Good ROM, no contractures. Normal muscle tone.  ?Skin: Poor skin turgor ?Neurologic: CN 2-12 grossly intact.  Able to move all extremities ?Psychiatric: Normal judgment and insight. Alert and oriented x 3. Normal mood.  ? ?Data Reviewed: ? ?Reviewed labs, imaging, and pertinent records as noted above in HPI ? ?Assessment and Plan: ?*Acute kidney injury superimposed on chronic kidney disease ([place]) secondary to BPH with urinary obstruction ?Patient presented with notable worsening kidney function.  Creatinine 4.43 with BUN 43.  Patient's creatinine had been more within a normal range in January of this year and 1.2.  Renal ultrasound last month noted concern for pelvic fullness with distended bladder and enlarged prostate.  Being followed by Dr. Osborne Casco of nephrology. ?-Admit to a medical telemetry bed ?-Renal ultrasound unimpressive ?-Insert Foley catheter for Strict I&Os and concern for  bladder outlet obstruction. ?-Continue sodium bicarb drip at 100 mL/h ?-Check renal  function panels daily ?-Adequate urinary output thus far ?-Slowly improving ? ?Leukocytosis ?Acute.  WBC elevated at 18.7.  Patient denies having any significant fevers. ?-No significant infection identified ? ?Primary cancer of head of pancreas ([place]) ?Patient with pancreatic cancer at the head of the pancreas diagnosed in 2021 currently on chemotherapy followed by Dr. Benay Spice.  Last chemotherapy treatment fluorouracil, leucovorin, and irinotecan was on 3/14 ?-Dr. Learta Codding  added to the treatment team ? ?Hypokalemia ?Acute.  Monitor and replete ? ?Hypomagnesemia ?Acute.  Monitor and replete ? ?Normocytic anemia history of esophageal varices ?-No current active bleeding ?-Monitor ? ?Insomnia ?Patient complains of having difficulty sleeping at night. ?-Melatonin nightly ?-Low-dose Ativan p.o. if melatonin not helpful ? ?Controlled type 2 diabetes mellitus without complication, without long-term current use of insulin ([place]) ?Last hemoglobin A1c was 6.1 on 08/27/2021.  Home medication regimen includes Actos 30 mg daily. ?-Hold Actos ? ?GERD (gastroesophageal reflux disease) ?- Continue Protonix ? ?Continue IV fluids and monitoring renal function.  Will likely need several more days of IV fluid therapy. ? ?DVT prophylaxis: Heparin ?Advance Care Planning:   Code Status: Full Code  ? ?Consults: Nephrology ? ?Family Communication: Discussed plan of care with the patient's wife present at bedside ? ? ?Author: ?Chinonso Linker A, MD ?02/11/2022 10:03 AM ? ?

## 2022-02-12 ENCOUNTER — Other Ambulatory Visit: Payer: Self-pay | Admitting: *Deleted

## 2022-02-12 ENCOUNTER — Inpatient Hospital Stay (HOSPITAL_COMMUNITY): Payer: Medicare Other

## 2022-02-12 DIAGNOSIS — N189 Chronic kidney disease, unspecified: Secondary | ICD-10-CM | POA: Diagnosis not present

## 2022-02-12 DIAGNOSIS — N179 Acute kidney failure, unspecified: Secondary | ICD-10-CM | POA: Diagnosis not present

## 2022-02-12 DIAGNOSIS — C250 Malignant neoplasm of head of pancreas: Secondary | ICD-10-CM

## 2022-02-12 LAB — RENAL FUNCTION PANEL
Albumin: 2.4 g/dL — ABNORMAL LOW (ref 3.5–5.0)
Anion gap: 9 (ref 5–15)
BUN: 32 mg/dL — ABNORMAL HIGH (ref 8–23)
CO2: 21 mmol/L — ABNORMAL LOW (ref 22–32)
Calcium: 7.6 mg/dL — ABNORMAL LOW (ref 8.9–10.3)
Chloride: 106 mmol/L (ref 98–111)
Creatinine, Ser: 3.6 mg/dL — ABNORMAL HIGH (ref 0.61–1.24)
GFR, Estimated: 17 mL/min — ABNORMAL LOW (ref >60)
Glucose, Bld: 152 mg/dL — ABNORMAL HIGH (ref 70–99)
Phosphorus: 1.8 mg/dL — ABNORMAL LOW (ref 2.5–4.6)
Potassium: 4 mmol/L (ref 3.5–5.1)
Sodium: 136 mmol/L (ref 135–145)

## 2022-02-12 LAB — URINALYSIS, ROUTINE W REFLEX MICROSCOPIC
Bilirubin Urine: NEGATIVE
Glucose, UA: NEGATIVE mg/dL
Ketones, ur: NEGATIVE mg/dL
Nitrite: NEGATIVE
Protein, ur: 30 mg/dL — AB
Specific Gravity, Urine: 1.011 (ref 1.005–1.030)
WBC, UA: >50 WBC/hpf — ABNORMAL HIGH (ref 0–5)
pH: 6 (ref 5.0–8.0)

## 2022-02-12 LAB — CBC WITH DIFFERENTIAL/PLATELET
Abs Immature Granulocytes: 0.3 10*3/uL — ABNORMAL HIGH (ref 0.00–0.07)
Basophils Absolute: 0 10*3/uL (ref 0.0–0.1)
Basophils Relative: 0 %
Eosinophils Absolute: 0 10*3/uL (ref 0.0–0.5)
Eosinophils Relative: 0 %
HCT: 20.9 % — ABNORMAL LOW (ref 39.0–52.0)
Hemoglobin: 7 g/dL — ABNORMAL LOW (ref 13.0–17.0)
Immature Granulocytes: 2 %
Lymphocytes Relative: 3 %
Lymphs Abs: 0.5 10*3/uL — ABNORMAL LOW (ref 0.7–4.0)
MCH: 31.1 pg (ref 26.0–34.0)
MCHC: 33.5 g/dL (ref 30.0–36.0)
MCV: 92.9 fL (ref 80.0–100.0)
Monocytes Absolute: 1.1 10*3/uL — ABNORMAL HIGH (ref 0.1–1.0)
Monocytes Relative: 7 %
Neutro Abs: 14.5 10*3/uL — ABNORMAL HIGH (ref 1.7–7.7)
Neutrophils Relative %: 88 %
Platelets: 207 10*3/uL (ref 150–400)
RBC: 2.25 MIL/uL — ABNORMAL LOW (ref 4.22–5.81)
RDW: 17.3 % — ABNORMAL HIGH (ref 11.5–15.5)
WBC: 16.5 10*3/uL — ABNORMAL HIGH (ref 4.0–10.5)
nRBC: 0 % (ref 0.0–0.2)

## 2022-02-12 LAB — LACTIC ACID, PLASMA
Lactic Acid, Venous: 4.2 mmol/L (ref 0.5–1.9)
Lactic Acid, Venous: 3.8 mmol/L (ref 0.5–1.9)

## 2022-02-12 MED ORDER — SODIUM CHLORIDE 0.9 % IV BOLUS
1000.0000 mL | Freq: Once | INTRAVENOUS | Status: AC
  Administered 2022-02-12: 1000 mL via INTRAVENOUS

## 2022-02-12 MED ORDER — TAMSULOSIN HCL 0.4 MG PO CAPS
0.4000 mg | ORAL_CAPSULE | Freq: Every day | ORAL | Status: DC
  Administered 2022-02-12 – 2022-02-16 (×4): 0.4 mg via ORAL
  Filled 2022-02-12 (×4): qty 1

## 2022-02-12 MED ORDER — POTASSIUM CHLORIDE CRYS ER 20 MEQ PO TBCR
40.0000 meq | EXTENDED_RELEASE_TABLET | Freq: Every day | ORAL | Status: DC
  Administered 2022-02-14 – 2022-02-16 (×3): 40 meq via ORAL
  Filled 2022-02-12 (×3): qty 2

## 2022-02-12 MED ORDER — SODIUM CHLORIDE 0.9 % IV SOLN
1.0000 g | INTRAVENOUS | Status: DC
  Administered 2022-02-12 – 2022-02-15 (×4): 1 g via INTRAVENOUS
  Filled 2022-02-12 (×4): qty 10

## 2022-02-12 NOTE — Progress Notes (Signed)
Subjective:  UOP at least 1100. - BUN and crt down a little-  K is good -  bicarb up-  hospitalist was going to send him home until he had a fever :(  ? ?Objective ?Vital signs in last 24 hours: ?Vitals:  ? 02/11/22 2330 02/12/22 0332 02/12/22 0844 02/12/22 1007  ?BP: (!) 117/58 115/63 117/66 (!) 101/55  ?Pulse:  (!) 104 (!) 117 (!) 118  ?Resp: '17 15 16 15  '$ ?Temp: 98.3 ?F (36.8 ?C) 98.2 ?F (36.8 ?C) 100 ?F (37.8 ?C) (!) 101.2 ?F (38.4 ?C)  ?TempSrc: Oral Oral Oral Oral  ?SpO2: 94% 100% (!) 73% 98%  ?Weight:      ?Height:      ? ?Weight change:  ? ?Intake/Output Summary (Last 24 hours) at 02/12/2022 1247 ?Last data filed at 02/12/2022 0547 ?Gross per 24 hour  ?Intake --  ?Output 1100 ml  ?Net -1100 ml  ? ? ? ?Assessment/Plan: 76 year old BM with DM but most importantly recent acute/subacute renal failure since 12/14/21 of unknown etiology but is progressive ?1.Renal- subacute renal failure since 1/30-  etiology unclear.  U/A negative.  ATN is in the differential given his chronic diarrhea and low BP here today -   ordered hydration in the form of bicarb based fluids-  BP much better.  Renal failure is listed as reaction to Irinotecan but dont know the mechanism and he did start it in late October and the renal failure didn't happen until February-  his last infusion was on 3/14.  Could he have HRS from the mets in his liver ?  urine sodium is not low.   Possibly he has BOO given LUTS and finding in the past of fullness- s/p foley.  Unfortunately patient would not be a candidate for a really aggressive work up with renal biopsy or any further IS drugs.   Also  would  not be a candidate for  dialysis given his advanced cancer.  From a kidney standpoint I was comfortable with discharge-  I would have wanted to keep the foley in but pt refuses- put on flomax and take out at discharge but if numbers worse and LUTS sxms may need again ?2. Hypertension/volume  - seems dry-  gave a  liter of NS - now bicarb based fluids at 100  per hour-  is better I will stop IVF ?3. Elytes-  giving magnesium iv and oral -  mag is OK and K as well -  will dec Kdur to 40 per day -  eating a little better ?  ?4. Anemia  - cancer and renal failure- ESA would be contraindicated... transfusion ?  ?5. Metastatic pancreatic CA-  Dr. Joylene Grapes talked to Dr. Benay Spice-  is concerned that this signifies the end of his journey  ? ?I am going to stop IVF sign off-  fever work up per primary team.  I will make sure has follow up with Dr. Joylene Grapes OK to remove foley -  give flomax as long as is able to pass voiding trial  ?  ? ? ?Jearl Meckel  ? ? ?Labs: ?Basic Metabolic Panel: ?Recent Labs  ?Lab 02/10/22 ?0425 02/11/22 ?0256 02/12/22 ?0128  ?NA 142 141 136  ?K 2.8* 3.3* 4.0  ?CL 120* 112* 106  ?CO2 12* 17* 21*  ?GLUCOSE 121* 142* 152*  ?BUN 38* 30* 32*  ?CREATININE 4.04* 4.00* 3.60*  ?CALCIUM 8.1* 8.4* 7.6*  ?PHOS 2.4* 1.5* 1.8*  ? ?Liver Function Tests: ?Recent  Labs  ?Lab 02/06/22 ?0306 02/10/22 ?0425 02/11/22 ?0256 02/12/22 ?0128  ?AST 15  --   --   --   ?ALT 25  --   --   --   ?ALKPHOS 254*  --   --   --   ?BILITOT 1.1  --   --   --   ?PROT 6.1*  --   --   --   ?ALBUMIN 3.4* 2.7* 2.8* 2.4*  ? ?Recent Labs  ?Lab 02/06/22 ?0306  ?LIPASE <10*  ? ?No results for input(s): AMMONIA in the last 168 hours. ?CBC: ?Recent Labs  ?Lab 02/06/22 ?0306 02/09/22 ?1123 02/10/22 ?0425 02/12/22 ?1200  ?WBC 1.3* 17.0* 8.9 16.5*  ?NEUTROABS 0.6* 15.0*  --  14.5*  ?HGB 7.2* 8.7* 7.5* 7.0*  ?HCT 22.3* 27.1* 23.6* 20.9*  ?MCV 95.3 98.9 97.5 92.9  ?PLT 62* 237 188 207  ? ?Cardiac Enzymes: ?No results for input(s): CKTOTAL, CKMB, CKMBINDEX, TROPONINI in the last 168 hours. ?CBG: ?Recent Labs  ?Lab 02/09/22 ?1122  ?GLUCAP 104*  ? ? ?Iron Studies: No results for input(s): IRON, TIBC, TRANSFERRIN, FERRITIN in the last 72 hours. ?Studies/Results: ?DG Chest Port 1 View ? ?Result Date: 02/12/2022 ?CLINICAL DATA:  Fever. EXAM: PORTABLE CHEST 1 VIEW COMPARISON:  Chest radiograph 02/09/2022  FINDINGS: Left subclavian power injectable port central venous catheter tip projects just below the level of the superior cavoatrial junction. Low lung volumes. The nodular density noted in the anterior left upper lobe on CT 02/06/2022 is not appreciated. No new focal consolidation, pleural effusion, or pneumothorax. No acute osseous abnormality. IMPRESSION: No acute cardiopulmonary abnormality. Electronically Signed   By: Ileana Roup M.D.   On: 02/12/2022 11:39   ?Medications: ?Infusions: ? sodium bicarbonate 150 mEq in D5W infusion 100 mL/hr at 02/12/22 0452  ? ? ?Scheduled Medications: ? Chlorhexidine Gluconate Cloth  6 each Topical Daily  ? feeding supplement  237 mL Oral BID BM  ? heparin  5,000 Units Subcutaneous Q8H  ? latanoprost  1 drop Both Eyes QHS  ? lipase/protease/amylase  72,000 Units Oral TID WC  ? loratadine  10 mg Oral Daily  ? magnesium oxide  400 mg Oral BID  ? melatonin  3 mg Oral QHS  ? multivitamin with minerals  1 tablet Oral Daily  ? pantoprazole  40 mg Oral BID  ? tamsulosin  0.4 mg Oral Daily  ? ? have reviewed scheduled and prn medications. ? ?Physical Exam: ?General: thin but alert and oriented-  NAD-  ?Heart: RRR ?Lungs: mostly clear ?Abdomen: distended ?Extremities: no edema ? ? ? ?02/12/2022,12:47 PM ? LOS: 3 days  ? ?  ? ? ? ? ?

## 2022-02-12 NOTE — Progress Notes (Addendum)
IP PROGRESS NOTE ? ?Subjective:  ? ?[name] has no complaint.  He wants to go home.  He denies nausea, pain, and bleeding.  He does not wish to receive a Red cell transfusion.  His wife is at the bedside ?Marland Kitchen ?Objective: ?Vital signs in last 24 hours: ?Blood pressure 117/66, pulse (!) 117, temperature 100 ?F (37.8 ?C), temperature source Oral, resp. rate 16, height 5' 9.02" (1.753 m), weight 148 lb 13 oz (67.5 kg), SpO2 (!) 73 %. ? ?Intake/Output from previous day: ?03/30 0701 - 03/31 0700 ?In: -  ?Out: 1100 [Urine:1100] ? ?Physical Exam: ? ?HEENT: No thrush ?Abdomen: Nontender, no hepatosplenomegaly, no mass ?Extremities: No leg edema ? ? ?Portacath/PICC-without erythema ? ?Lab Results: ?Recent Labs  ?  02/09/22 ?1123 02/10/22 ?0425  ?WBC 17.0* 8.9  ?HGB 8.7* 7.5*  ?HCT 27.1* 23.6*  ?PLT 237 188  ? ? ?BMET ?Recent Labs  ?  02/11/22 ?0256 02/12/22 ?0128  ?NA 141 136  ?K 3.3* 4.0  ?CL 112* 106  ?CO2 17* 21*  ?GLUCOSE 142* 152*  ?BUN 30* 32*  ?CREATININE 4.00* 3.60*  ?CALCIUM 8.4* 7.6*  ? ? ?Lab Results  ?Component Value Date  ? EXN170 0,174 (H) 01/26/2022  ? ? ?Medications: I have reviewed the patient's current medications. ? ?Assessment/Plan: ? ?Pancreas cancer ?CT urology center 03/04/2020-haziness of the fat adjacent to the celiac axis and SMA with focal narrowing of the SMV, splenic vein, and splenoportal confluence ?CT 07/07/2020-infiltrative retroperitoneal mass with encasement of the celiac axis and SMA, high-grade narrowing of the proximal portal vein with cavernous transformation, nonocclusive thrombus within the main portal vein, mild biliary ductal dilatation, diffuse hypodense/hypoenhancing areas within the liver (edema versus infiltrating neoplasm), 2 x 1.7 cm area of focal prominence in the pancreas ?EUS 07/10/2020-no pancreas mass identified, 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, 1.5 cm aortocaval node biopsy-scant lymphoid material, no malignancy ?Diagnostic laparoscopy 07/17/2020-segment 3  and 4 liver nodules, adenocarcinoma, positive for pankeratin, CK7 and CK20, partially positive for TTF-1, negative for CDX2 ?Foundation 1-microsatellite stable, tumor mutation burden 4, K-ras G12D, subclonal RB1 alteration ?Elevated CA 19-9 ?Cycle 1 FOLFOX 07/28/2020 ?Cycle 2 FOLFOX 08/18/2020, oxaliplatin dose reduced due to thrombocytopenia ?Cycle 3 FOLFOX 09/08/2020 ?Cycle 4 FOLFOX 09/29/2020  ?Cycle 5 FOLFOX 10/20/2020 ?CTs 11/06/2020-grossly stable infiltrative mass within the pancreatic head and porta hepatis.  New small low-density liver lesions.  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 ?Cycle 6 FOLFOX 11/10/2021  ?Cycle 7 FOLFOX 12/01/2020 ?MRI liver 12/04/2020-no significant change in posttreatment appearance of the pancreatic head.  Numerous intrinsically low signal hypoenhancing lesions of the liver parenchyma predominantly observed in the right lobe of the liver and liver dome, measuring no greater than 8 mm and as seen on recent prior CT of the abdomen/pelvis. ?Cycle 8 FOLFOX 12/22/2020 ?Cycle 9 FOLFOX 01/12/2021 ?Cycle 10 FOLFOX 02/02/2021 ?MRI liver 02/19/2021-no change in pancreas head mass, multiple hypoenhancing liver lesions are new and increased in size, effacement of portal vein by pancreas head mass with cavernous transformation, no change in mild splenomegaly ?Cycle 1 gemcitabine/Abraxane 03/12/2021 ?Cycle 2 gemcitabine/Abraxane 03/27/2021- dose reductions secondary to neutropenia ?Cycle 3 gemcitabine/Abraxane 04/17/2021-dose reductions due to thrombocytopenia, white cell growth factor support added ?Cycle 4 gemcitabine/Abraxane 05/01/2021, Ziextenzo ?Cycle 5 gemcitabine/Abraxane 05/15/2021, Ziextenzo ?MRI abdomen 05/26/2021-mild enlargement of dominant hepatic metastases, appearance of infiltrative pancreas mass extending to the celiac trunk and SMA, splenic vein  occlusion, cavernous  transformation of the portal vein with upper abdomen collaterals, splenomegaly, mild upper abdominal ascites and mesenteric edema, minimal subpleural nodularity left lower lobe ?Cycle 6 gemcitabine/Abraxane 05/28/2021 ?Cycle 7 gemcitabine/Abraxane 06/12/2021 ?Cycle 8 gemcitabine/Abraxane 06/26/2021 ?Cycle 9 gemcitabine/Abraxane 07/10/2021 ?Cycle 10 gemcitabine/Abraxane 07/23/2021 ?Cycle 11 gemcitabine/Abraxane 08/06/2021 ?MRI abdomen 08/20/2021-multifocal nodularity in the lower chest with a new discrete right lower lobe nodule, enlarging subsegment 7/8 liver lesion, other small lesions appear stable, gastric varices, review of images with radiology-enlargement of at least 2 liver lesions, suspicious nodule n the right lower lung ?Cycle 1  5-FU/liposomal irinotecan 09/08/2021 ?Cycle 2  5-FU/liposomal irinotecan 09/22/2021 ?Cycle 3  5-FU/liposomal irinotecan 10/20/2021 ?Cycle 4 5-FU/liposomal irinotecan 11/04/2021 ?Cycle 5 5-FU/liposomal irinotecan 11/17/2021 ?MRI abdomen 11/27/2021-generally unchanged appearance of the pancreas with a very ill-defined hypoenhancing mass centered within the pancreatic neck, again appears to encase the celiac axis and encasing occlude the superior mesenteric vein and splenic vein; interval enlargement of multiple liver lesions; numerous lung nodules in the bilateral lung bases; unchanged splenomegaly; multiple new heterogeneously and hypoenhancing subcapsular lesions of the spleen most consistent with infarctions; small volume ascites throughout the abdomen; distended, debris-filled stomach suggesting some degree of gastric outlet and/or duodenal obstruction. ?Cycle 6 5-FU/liposomal irinotecan 12/01/2021 ?Cycle 7 5-FU/liposomal irinotecan 12/14/2021 ?Cycle 8 5-FU/liposomal irinotecan 01/12/2022 ?Cycle 9 5-FU/liposomal irinotecan 01/26/2022 ?CTs 02/06/2022-slight increase in size of liver metastases, indeterminate spleen lesion suspicious for metastasis, scattered reticulonodular densities in the  lungs ?  ?Chronic thrombocytopenia- weekly Nplate beginning 07/25/3111 ?Diabetes ?Hypertension ?Abdomen/back pain secondary to #1 ?Oxaliplatin neuropathy-moderate loss of vibratory sense on exam 01/12/2021, 02/02/2021 ?Neutropenia following gemcitabine/Abraxane chemotherapy-G-CSF added beginning with cycle 3 ?8.   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   Admission with recurrent GI bleeding and severe anemia 09/15/2021 ?Upper endoscopy 09/17/2021-esophageal varices with stigmata of recent bleeding, banded.  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  Influenza A 10/13/2021 ?11.  Renal insufficiency February 2023-renal ultrasound 01/07/2022-increase renal cortical echogenicity bilaterally, mild bilateral renal pelvic fullness; referred to nephrology ?12.  Admission 02/09/2022 for evaluation and management of progressive renal failure ?  ?  ?Mr. Carton appears unchanged.  The creatinine is partially improved today. ?We discussed the diagnosis of metastatic pancreas cancer and treatment options.  He understands systemic treatment options are very limited given his previous treatment history and current clinical status.  I estimate the chance of clinical improvement with further chemotherapy at less than 10%.  He would like to consider further chemotherapy. ? ?Mr. Crysler would like to go home if the nephrology service is in agreement. ? ?The hemoglobin remains low.  He has received transfusions in the past for severe anemia.  I  recommend a Red cell transfusion prior to discharge if he is in agreement. ? ?Recommendations: ?1. evaluation and management of the renal failure per Dr. Moshe Cipro ?2.  Outpatient follow-up will be scheduled at

## 2022-02-12 NOTE — Care Management Important Message (Signed)
Important Message ? ?Patient Details  ?Name: Aaron Johnston ?MRN: [id number] ?Date of Birth: Jan 31, 1946 ? ? ?Medicare Important Message Given:  Yes ? ? ? ? ?[name] ?02/12/2022, 1:03 PM ?

## 2022-02-12 NOTE — Progress Notes (Signed)
?Progress note ? ?Patient: Aaron Johnston VOJ:[id number] DOB: 1945-11-24 ?DOA: 02/09/2022 ?DOS: the patient was seen and examined on 02/12/2022 ?PCP: [name], [name], MD  ?Patient coming from: Home ? ?Chief Complaint: Decreased kidney ? ?HPI: Aaron Johnston is a 76 y.o. male with medical history significant of hypertension, hyperlipidemia, diabetes mellitus type 2, GI bleed secondary to esophageal varices status post banding, and pancreatic cancer diagnosed in 2021 followed by Dr. Learta Codding  on chemotherapy presents with reports of worsening kidney function after lab work obtained yesterday by his nephrologist Dr. Osborne Casco.  Patient's kidney function previously had been around 1.2 in January of this year, but since that time has slowly trended up from 2->3. He had an renal ultrasound 2/23 which noted mild renal pelvic fullness bilaterally thought to be secondary to distended bladder with enlarged prostate.  His wife is present at bedside and notes that over the last 2 to 3 weeks his appetite has definitely decreased.  Patient complains of things having a bad taste possibly related to to his chemotherapy.  Associated symptoms include decreased urine output, intermittent nosebleeds, weight loss of approximately 7 pounds (119 -> 112 ),wife reports issues with his memory, and intermittently being confused.  Denies having any fever, cough, or blood in his stool/urine.  He reports still having bowel movements and being able to pass flatus.  He had just received infusions of pegfilgrastim and romiplostim  on 3/16.  His last chemotherapy treatment fluorouracil, leucovorin, and irinotecan was on 3/14.  Lastly patient brought up these been having difficulty sleeping, but patient's wife notes that he sleeps throughout the day. ? ?Upon admission into the emergency department patient was noted to be afebrile with vital signs relatively stable.  WBC 17, hemoglobin 8.7, potassium 3.2, CO2 9, BUN 42, creatinine 4.43, and magnesium 1.3.   Chest x-ray noted no acute abnormality.  Neurology was formally consulted.  Patient had been given 500 mL bolus of IV fluids, potassium chloride 40 mEq, magnesium sulfate 2 g IV, and started on sodium bicarb drip at 100 mL/h. ? ?Subjective ?Patient was in the process of being discharged when he spiked a fever to 102. ? ?Exam ? ?Constitutional: Chronically ill-appearing elderly male currently in no acute ?Eyes: PERRL, lids and conjunctivae normal ?ENMT: Mucous membranes are moist. Posterior pharynx clear of any exudate or lesions.   ?Neck: normal, supple, no masses, no thyromegaly ?Respiratory: clear to auscultation bilaterally, no wheezing, no crackles. Normal respiratory effort. No accessory muscle use.  ?Cardiovascular: Regular rate and rhythm, no murmurs / rubs / gallops.  Trace bilateral lower. 2+ pedal pulses. No carotid bruits.  ?Abdomen: no tenderness, no masses palpated. Bowel sounds positive.  ?Musculoskeletal: no clubbing / cyanosis. No joint deformity upper and lower extremities. Good ROM, no contractures. Normal muscle tone.  ?Skin: Poor skin turgor ?Neurologic: CN 2-12 grossly intact.  Able to move all extremities ?Psychiatric: Normal judgment and insight. Alert and oriented x 3. Normal mood.  ? ?Data Reviewed: ? ?Reviewed labs, imaging, and pertinent records as noted above in HPI ? ?Assessment and Plan: ?*Acute kidney injury superimposed on chronic kidney disease ([place]) secondary to BPH with urinary obstruction ?Patient presented with notable worsening kidney function.  Creatinine 4.43 with BUN 43.  Patient's creatinine had been more within a normal range in January of this year and 1.2.  Renal ultrasound last month noted concern for pelvic fullness with distended bladder and enlarged prostate.  Being followed by Dr. Osborne Casco of nephrology. ?-Admit to a medical telemetry bed ?-Renal ultrasound  unimpressive ?-Insert Foley catheter for Strict I&Os and concern for bladder outlet obstruction. ?-Continue  sodium bicarb drip at 100 mL/h ?-Check renal function panels daily ?-Adequate urinary output thus far ?-Slowly improving ? ?Leukocytosis now with fever 02/12/2022 ?Acute.  WBC elevated at 18.7 on admission and resolved with no antibiotics.  Now has fever ?-Check UA, two-view chest x-ray, blood cultures, lactic acid and procalcitonin level.  Hold off on antibiotics at this time. ? ?Primary cancer of head of pancreas ([place]) ?Patient with pancreatic cancer at the head of the pancreas diagnosed in 2021 currently on chemotherapy followed by Dr. Benay Spice.  Last chemotherapy treatment fluorouracil, leucovorin, and irinotecan was on 3/14 ?-Dr. Learta Codding  added to the treatment team ? ?Hypokalemia ?Acute.  Monitor and replete ? ?Hypomagnesemia ?Acute.  Monitor and replete ? ?Normocytic anemia history of esophageal varices ?-No current active bleeding ?-Monitor ? ?Insomnia ?Patient complains of having difficulty sleeping at night. ?-Melatonin nightly ?-Low-dose Ativan p.o. if melatonin not helpful ? ?Controlled type 2 diabetes mellitus without complication, without long-term current use of insulin ([place]) ?Last hemoglobin A1c was 6.1 on 08/27/2021.  Home medication regimen includes Actos 30 mg daily. ?-Hold Actos ? ?GERD (gastroesophageal reflux disease) ?- Continue Protonix ? ? ?DVT prophylaxis: Heparin ? ?Hold discharge at this time secondary to spiking fever.  Keep Foley catheter in for now due to fever.  Patient had initially wanted Foley catheter removed prior to his discharge because it is very uncomfortable for him. ? ?Advance Care Planning:   Code Status: Full Code  ? ?Consults: Nephrology ? ?Family Communication: Discussed plan of care with the patient's wife present at bedside ? ? ?Author: ?Phillips Grout, MD ?02/12/2022 12:03 PM ? ?

## 2022-02-12 NOTE — Progress Notes (Signed)
Per Dr. Benay Spice: Needs lab/flush/OV on 02/16/22. High priority scheduling message sent and lab orders entered. ?

## 2022-02-12 NOTE — Discharge Instructions (Addendum)
PER MEDICAL ONCOLOGY: Follow up on 02/19/22 at [place] at [facility] for lab/port flush/office visit with [name], NP. Call with any questions or concerns. ?

## 2022-02-13 ENCOUNTER — Inpatient Hospital Stay (HOSPITAL_COMMUNITY): Payer: Medicare Other

## 2022-02-13 DIAGNOSIS — D649 Anemia, unspecified: Secondary | ICD-10-CM | POA: Diagnosis not present

## 2022-02-13 DIAGNOSIS — N179 Acute kidney failure, unspecified: Secondary | ICD-10-CM | POA: Diagnosis not present

## 2022-02-13 DIAGNOSIS — C259 Malignant neoplasm of pancreas, unspecified: Secondary | ICD-10-CM | POA: Diagnosis not present

## 2022-02-13 DIAGNOSIS — I9589 Other hypotension: Secondary | ICD-10-CM

## 2022-02-13 DIAGNOSIS — C787 Secondary malignant neoplasm of liver and intrahepatic bile duct: Secondary | ICD-10-CM

## 2022-02-13 DIAGNOSIS — N189 Chronic kidney disease, unspecified: Secondary | ICD-10-CM | POA: Diagnosis not present

## 2022-02-13 DIAGNOSIS — E861 Hypovolemia: Secondary | ICD-10-CM

## 2022-02-13 LAB — RENAL FUNCTION PANEL
Albumin: 2.1 g/dL — ABNORMAL LOW (ref 3.5–5.0)
Anion gap: 9 (ref 5–15)
BUN: 32 mg/dL — ABNORMAL HIGH (ref 8–23)
CO2: 24 mmol/L (ref 22–32)
Calcium: 7.5 mg/dL — ABNORMAL LOW (ref 8.9–10.3)
Chloride: 104 mmol/L (ref 98–111)
Creatinine, Ser: 3.51 mg/dL — ABNORMAL HIGH (ref 0.61–1.24)
GFR, Estimated: 18 mL/min — ABNORMAL LOW (ref >60)
Glucose, Bld: 135 mg/dL — ABNORMAL HIGH (ref 70–99)
Phosphorus: 2.3 mg/dL — ABNORMAL LOW (ref 2.5–4.6)
Potassium: 3.7 mmol/L (ref 3.5–5.1)
Sodium: 137 mmol/L (ref 135–145)

## 2022-02-13 LAB — CBC WITH DIFFERENTIAL/PLATELET
Abs Immature Granulocytes: 0.28 10*3/uL — ABNORMAL HIGH (ref 0.00–0.07)
Basophils Absolute: 0 10*3/uL (ref 0.0–0.1)
Basophils Relative: 0 %
Eosinophils Absolute: 0 10*3/uL (ref 0.0–0.5)
Eosinophils Relative: 0 %
HCT: 19.5 % — ABNORMAL LOW (ref 39.0–52.0)
Hemoglobin: 6.4 g/dL — CL (ref 13.0–17.0)
Immature Granulocytes: 2 %
Lymphocytes Relative: 3 %
Lymphs Abs: 0.5 10*3/uL — ABNORMAL LOW (ref 0.7–4.0)
MCH: 31.1 pg (ref 26.0–34.0)
MCHC: 32.8 g/dL (ref 30.0–36.0)
MCV: 94.7 fL (ref 80.0–100.0)
Monocytes Absolute: 0.9 10*3/uL (ref 0.1–1.0)
Monocytes Relative: 6 %
Neutro Abs: 15 10*3/uL — ABNORMAL HIGH (ref 1.7–7.7)
Neutrophils Relative %: 89 %
Platelets: 174 10*3/uL (ref 150–400)
RBC: 2.06 MIL/uL — ABNORMAL LOW (ref 4.22–5.81)
RDW: 17.2 % — ABNORMAL HIGH (ref 11.5–15.5)
WBC: 16.8 10*3/uL — ABNORMAL HIGH (ref 4.0–10.5)
nRBC: 0 % (ref 0.0–0.2)

## 2022-02-13 LAB — HEMOGLOBIN AND HEMATOCRIT, BLOOD
HCT: 20.5 % — ABNORMAL LOW (ref 39.0–52.0)
HCT: 26.6 % — ABNORMAL LOW (ref 39.0–52.0)
Hemoglobin: 7 g/dL — ABNORMAL LOW (ref 13.0–17.0)
Hemoglobin: 9 g/dL — ABNORMAL LOW (ref 13.0–17.0)

## 2022-02-13 LAB — PREPARE RBC (CROSSMATCH)

## 2022-02-13 LAB — OCCULT BLOOD X 1 CARD TO LAB, STOOL: Fecal Occult Bld: POSITIVE — AB

## 2022-02-13 SURGERY — EGD (ESOPHAGOGASTRODUODENOSCOPY)
Anesthesia: Monitor Anesthesia Care

## 2022-02-13 MED ORDER — LACTATED RINGERS IV BOLUS
500.0000 mL | Freq: Once | INTRAVENOUS | Status: AC
  Administered 2022-02-13: 500 mL via INTRAVENOUS

## 2022-02-13 MED ORDER — SODIUM CHLORIDE 0.9 % IV BOLUS
1000.0000 mL | Freq: Once | INTRAVENOUS | Status: AC
  Administered 2022-02-13: 1000 mL via INTRAVENOUS

## 2022-02-13 MED ORDER — PANTOPRAZOLE SODIUM 40 MG IV SOLR
40.0000 mg | Freq: Two times a day (BID) | INTRAVENOUS | Status: DC
  Administered 2022-02-13 – 2022-02-14 (×3): 40 mg via INTRAVENOUS
  Filled 2022-02-13 (×3): qty 10

## 2022-02-13 MED ORDER — SODIUM CHLORIDE 0.9% FLUSH
10.0000 mL | INTRAVENOUS | Status: DC | PRN
  Administered 2022-02-16: 10 mL

## 2022-02-13 MED ORDER — SODIUM CHLORIDE 0.9 % IV SOLN
50.0000 ug/h | INTRAVENOUS | Status: DC
  Administered 2022-02-13 – 2022-02-14 (×3): 50 ug/h via INTRAVENOUS
  Filled 2022-02-13 (×3): qty 1

## 2022-02-13 MED ORDER — OCTREOTIDE LOAD VIA INFUSION
50.0000 ug | Freq: Once | INTRAVENOUS | Status: AC
  Administered 2022-02-13: 50 ug via INTRAVENOUS
  Filled 2022-02-13: qty 25

## 2022-02-13 MED ORDER — SODIUM CHLORIDE 0.9% IV SOLUTION: Freq: Once | INTRAVENOUS | Status: DC

## 2022-02-13 MED ORDER — SODIUM CHLORIDE 0.9% FLUSH
10.0000 mL | Freq: Two times a day (BID) | INTRAVENOUS | Status: DC
  Administered 2022-02-13 – 2022-02-16 (×7): 10 mL

## 2022-02-13 MED ORDER — SODIUM CHLORIDE 0.9 % IV SOLN: INTRAVENOUS | Status: AC

## 2022-02-13 MED ORDER — SODIUM CHLORIDE 0.9% IV SOLUTION: Freq: Once | INTRAVENOUS | Status: AC

## 2022-02-13 NOTE — Consult Note (Signed)
? ?NAME:  Aaron Johnston, MRN:  [id number], DOB:  28-Jan-1946, LOS: 4 ?ADMISSION DATE:  02/09/2022, CONSULTATION DATE:  02/13/22 ?REFERRING MD:  Jamse Arn - [facility], CHIEF COMPLAINT:  hypotension  ? ?History of Present Illness:  ?76 yo M PMH metastatic pancreatic cancer on palliative chemo, hx esophageal varices, failure to thrive in adult, anemia, CKD, BPH who was admitted to the hospital 3/28 for AKI. He was nearing discharge readiness until he started to have a fever. Cx were sent. Given rocephin ?On 4/1 the patient had downward trending Bps and another elevated temp. He was noted to have hgb 6.5 and received PRBC. Repeat 7, receiving further transfusions and IVF.  ? ?PCCM consulted in this setting ? ?Pertinent  Medical History  ?Metastatic pancreatic cancer ?Esophageal varices  ?FTT ?CKD ?DM ? ?Significant Hospital Events: ?Including procedures, antibiotic start and stop dates in addition to other pertinent events   ?3/28 admit to [place] ?3/31 spiked a fever, which deferred discharge. Cx sent, received rocephin. UA with + bacteria  ?4/1 hgb drop to 6.2, received PRBC, then repeat for hgb of 7. SBPs 80-90. PCCM consult. GI consult ? ?Interim History / Subjective:  ?Ordering dinner ?Wife at bedside  ? ?I moved BP cuff to [place] and repeat BP was 98/62  ? ?Objective   ?Blood pressure 98/62, pulse 79, temperature 97.8 ?F (36.6 ?C), temperature source Oral, resp. rate 11, height 5' 9.02" (1.753 m), weight 67.5 kg, SpO2 100 %. ?   ?   ? ?Intake/Output Summary (Last 24 hours) at 02/13/2022 1552 ?Last data filed at 02/13/2022 0645 ?Gross per 24 hour  ?Intake 331.25 ml  ?Output 450 ml  ?Net -118.75 ml  ? ?Filed Weights  ? 02/10/22 1903  ?Weight: 67.5 kg  ? ? ?Examination: ?General: Thin chronically ill appearing elderly M NAD ?HENT: NCAT. Temporal muscle wasting  ?Lungs: CTA ?Cardiovascular: rrr ?Abdomen: thin soft ndnt  ?Extremities: no acute deformity no cyanosis or clubbing  ?Neuro: AAOx4 no focal deficit ?GU: defer ?Skin: + turgor   ? ?Resolved Hospital Problem list   ? ? ?Assessment & Plan:  ? ?Borderline hypotension ?-think he is still volume down. Has turgor. Says he hasnt been eating/drinking well in hospital.  ?-? Component of blood loss contributing as well with hgb drop but no melena or hematochezia  ?-did have spike in temp + leukocytosis. Ua with bacteria. Could be sepsis. Leukocytosis could also be secondary to G CSF tx  ?P ?-stable to remain on [facility] service ?-agree with ongoing fluid resuscitation  ?-PRBC for goal 8 ?-GI consulted -- rec CT a/p, octreotide  ?-cont rocephin  ? ?AKI on CKD  ?BPH ??CAUTI ?P ?-IVF ?-trend UOP  ?-foley needed for BPH ?-rocephin as above ? ?Metastatic pancreatic cancer  ?-looks like this has been a challenging path with what looks to be palliative chemo. Carries sadly a very poor prognosis overall.  ?-we discussed giving thought to goals of care and did briefly discuss the concept of symptom focussed palliative care as the patient shared Dr. Benay Spice has encouraged consideration of this. Talked about how hard chemo has been on the patient physically. I do think transitioning away from chemo efforts and focusing on quality of life and palliative symptom management would be very appropriate for this patient to consider.  ?-I also encouraged pt/wife to think about what scope of care for acute illness processes they would find acceptable. Introduced the topic of code status for further consideration ? ? ? ?Best Practice (right click and "  Reselect all SmartList Selections" daily)  ? ?Diet/type: Regular consistency (see orders) ?DVT prophylaxis: SCD ?GI prophylaxis: N/A ?Lines: Central line ?Foley:  Yes, and it is still needed ?Code Status:  full code ?Last date of multidisciplinary goals of care discussion [4/1] ? ?Labs   ?CBC: ?Recent Labs  ?Lab 02/09/22 ?1123 02/10/22 ?0425 02/12/22 ?1200 02/13/22 ?0115 02/13/22 ?0955  ?WBC 17.0* 8.9 16.5* 16.8*  --   ?NEUTROABS 15.0*  --  14.5* 15.0*  --   ?HGB 8.7* 7.5*  7.0* 6.4* 7.0*  ?HCT 27.1* 23.6* 20.9* 19.5* 20.5*  ?MCV 98.9 97.5 92.9 94.7  --   ?PLT 237 188 207 174  --   ? ? ?Basic Metabolic Panel: ?Recent Labs  ?Lab 02/09/22 ?1123 02/10/22 ?0425 02/11/22 ?0256 02/12/22 ?0128 02/13/22 ?0115  ?NA 141 142 141 136 137  ?K 3.2* 2.8* 3.3* 4.0 3.7  ?CL 121* 120* 112* 106 104  ?CO2 9* 12* 17* 21* 24  ?GLUCOSE 120* 121* 142* 152* 135*  ?BUN 42* 38* 30* 32* 32*  ?CREATININE 4.43* 4.04* 4.00* 3.60* 3.51*  ?CALCIUM 8.5* 8.1* 8.4* 7.6* 7.5*  ?MG 1.3* 1.5* 1.9  --   --   ?PHOS  --  2.4* 1.5* 1.8* 2.3*  ? ?GFR: ?Estimated Creatinine Clearance: 17.9 mL/min (A) (by C-G formula based on SCr of 3.51 mg/dL (H)). ?Recent Labs  ?Lab 02/09/22 ?1123 02/10/22 ?0425 02/12/22 ?1131 02/12/22 ?1200 02/12/22 ?1440 02/13/22 ?0115  ?WBC 17.0* 8.9  --  16.5*  --  16.8*  ?LATICACIDVEN  --   --  4.2*  --  3.8*  --   ? ? ?Liver Function Tests: ?Recent Labs  ?Lab 02/10/22 ?0425 02/11/22 ?0256 02/12/22 ?0128 02/13/22 ?0115  ?ALBUMIN 2.7* 2.8* 2.4* 2.1*  ? ?No results for input(s): LIPASE, AMYLASE in the last 168 hours. ?No results for input(s): AMMONIA in the last 168 hours. ? ?ABG ?   ?Component Value Date/Time  ? TCO2 19 (L) 08/29/2021 0952  ?  ? ?Coagulation Profile: ?No results for input(s): INR, PROTIME in the last 168 hours. ? ?Cardiac Enzymes: ?No results for input(s): CKTOTAL, CKMB, CKMBINDEX, TROPONINI in the last 168 hours. ? ?HbA1C: ?Hgb A1c MFr Bld  ?Date/Time Value Ref Range Status  ?08/27/2021 06:22 AM 6.1 (H) 4.8 - 5.6 % Final  ?  Comment:  ?  (NOTE) ?Pre diabetes:          5.7%-6.4% ? ?Diabetes:              >6.4% ? ?Glycemic control for   <7.0% ?adults with diabetes ?  ? ? ?CBG: ?Recent Labs  ?Lab 02/09/22 ?1122  ?GLUCAP 104*  ? ? ?Review of Systems:   ?Review of Systems  ?Constitutional:  Positive for fever, malaise/fatigue and weight loss.  ?HENT: Negative.    ?Eyes: Negative.   ?Respiratory: Negative.    ?Cardiovascular: Negative.   ?Gastrointestinal:  Positive for abdominal pain and nausea.  Negative for blood in stool and melena.  ?Genitourinary:  Positive for dysuria.  ?Musculoskeletal: Negative.   ?Skin: Negative.   ?Neurological: Negative.   ?Endo/Heme/Allergies: Negative.   ?Psychiatric/Behavioral: Negative.    ? ? ?Past Medical History:  ?He,  has a past medical history of Acute kidney injury superimposed on chronic kidney disease ([place]) (02/09/2022), Cancer ([place]), Diabetes mellitus without complication ([place]), Family history of breast cancer, Family history of colon cancer, Family history of pancreatic cancer, and Family history of stomach cancer.  ? ?Surgical History:  ? ?Past Surgical History:  ?Procedure Laterality  Date  ? BIOPSY  08/29/2021  ? Procedure: BIOPSY;  Surgeon: Otis Brace, MD;  Location: [facility];  Service: Gastroenterology;;  ? COLONOSCOPY WITH PROPOFOL N/A 08/29/2021  ? Procedure: COLONOSCOPY WITH PROPOFOL;  Surgeon: Otis Brace, MD;  Location: [facility];  Service: Gastroenterology;  Laterality: N/A;  ? ESOPHAGEAL BANDING  09/17/2021  ? Procedure: ESOPHAGEAL BANDING;  Surgeon: Otis Brace, MD;  Location: [facility];  Service: Gastroenterology;;  ? ESOPHAGEAL BANDING N/A 11/03/2021  ? Procedure: ESOPHAGEAL BANDING;  Surgeon: Otis Brace, MD;  Location: [facility];  Service: Gastroenterology;  Laterality: N/A;  ? ESOPHAGOGASTRODUODENOSCOPY (EGD) WITH PROPOFOL N/A 08/27/2021  ? Procedure: ESOPHAGOGASTRODUODENOSCOPY (EGD) WITH PROPOFOL;  Surgeon: Clarene Essex, MD;  Location: [facility];  Service: Endoscopy;  Laterality: N/A;  ? ESOPHAGOGASTRODUODENOSCOPY (EGD) WITH PROPOFOL N/A 09/17/2021  ? Procedure: ESOPHAGOGASTRODUODENOSCOPY (EGD) WITH PROPOFOL;  Surgeon: Otis Brace, MD;  Location: [facility];  Service: Gastroenterology;  Laterality: N/A;  ? ESOPHAGOGASTRODUODENOSCOPY (EGD) WITH PROPOFOL N/A 11/03/2021  ? Procedure: ESOPHAGOGASTRODUODENOSCOPY (EGD) WITH PROPOFOL;  Surgeon: Otis Brace, MD;  Location: [facility];  Service:  Gastroenterology;  Laterality: N/A;  ?  ? ?Social History:  ? reports that he quit smoking about 41 years ago. His smoking use included cigarettes. He has never used smokeless tobacco. He reports that he does not cur

## 2022-02-13 NOTE — Progress Notes (Signed)
[facility] notified that Hemoccult of stool was positive ?

## 2022-02-13 NOTE — Progress Notes (Signed)
? ?PROGRESS NOTE ? ? ? ?Aaron Johnston  ?VHQ:[id number]  ?DOB: 04-10-1946  ?DOA: 02/09/2022 ?PCP: Aaron Johnston, L.Aaron Sa, MD ?Outpatient Specialists: ? ? 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  Patient was treated with IV fluid resuscitation with improvement of renal function.  He was scheduled to go home however subsequently had fever and diagnosed with CAUTI.  Patient was started on ceftriaxone. ? ?Overnight 02/12/2022, patient was noted to have hypotension, drop in H&H and guaiac positive stool.  He was started on octreotide and PPI and transfused 1 unit PRBC ? ? ?Subjective: ?Patient states that he feels fine.  He has no complaints at all.  Denies any melena or hematemesis.  Does not feel any weaker than usual. ? ? ?Objective: ?Vitals:  ? 02/13/22 0645 02/13/22 0757 02/13/22 1133 02/13/22 1530  ?BP: (!) 95/55 (!) 88/54 (!) 83/49 98/62  ?Pulse:   88 79  ?Resp: '18 16 14 11  '$ ?Temp: 99.4 ?F (37.4 ?C) 100.2 ?F (37.9 ?C) 98.5 ?F (36.9 ?C) 97.8 ?F (36.6 ?C)  ?TempSrc: Oral Oral Oral Oral  ?SpO2:   92% 100%  ?Weight:      ?Height:      ? ? ?Intake/Output Summary (Last 24 hours) at 02/13/2022 1602 ?Last data filed at 02/13/2022 0645 ?Gross per 24 hour  ?Intake 331.25 ml  ?Output 450 ml  ?Net -118.75 ml  ? ?Filed Weights  ? 02/10/22 1903  ?Weight: 67.5 kg  ? ? ? ?Exam: ? ?General: Comfortable appearing thin man lying in bed with attentive wife at bedside ?Eyes: sclera anicteric, conjuctiva mild injection bilaterally ?CVS: S1-S2, regular  ?Respiratory:  decreased air entry bilaterally secondary to decreased inspiratory effort, rales at bases  ?GI: NABS, soft, NT  ?LE: No edema.  ?Neuro: A/O x 3, Moving all extremities equally with normal strength, CN 3-12 intact, grossly nonfocal.  ?Psych: patient is logical and coherent, judgement and insight appear normal, mood  and affect appropriate to situation. ? ? ?Assessment & Plan: ?  ?Anemia with hypotension with concern for GI bleed ?Especially given history of varices s/p banding with known portal hypertension. ?Patient seen by Aaron Johnston, patient previously followed by [facility] ?Plan is for conservative management as recommended by them ?Follow CBC closely and transfuse to hemoglobin of 8 ?Support hypotension with IV fluid resuscitation and blood ?Patient seen by PCCM today, not a candidate for ICU at present however if BP continues to fall, reconsult ?Continue octreotide ?Continue PPI IV ?CT scan abdomen pelvis noncontrast was ordered to assess for retroperitoneal bleeding ? ?CAUTI ?Continue ceftriaxone ?Persistent leukocytosis may well be secondary to pancreatic CA ? ?AKI on CKD ?Improving ? ?DM 2 ?Under reasonable control on present regimen ? ?Pancreatic CA ?Diagnosed 2021, presently on chemotherapy by Dr. Learta Johnston ?On 5-FU, leucovorin and irinotecan ? ? ?DVT prophylaxis: SCD ?Code Status: Full ?Family Communication: Patient's wife was at bedside throughout ?Disposition Plan:  ? Patient is from: Home ? Anticipated Discharge Location: TBD ? Barriers to Discharge: Hypotension ? Is patient medically stable for Discharge: No ? ? ?Scheduled Meds: ? sodium chloride   Intravenous Once  ? sodium chloride   Intravenous Once  ? Chlorhexidine Gluconate Cloth  6 each Topical Daily  ? feeding supplement  237 mL Oral BID BM  ? latanoprost  1 drop Both Eyes QHS  ? lipase/protease/amylase  72,000 Units Oral TID  WC  ? loratadine  10 mg Oral Daily  ? magnesium oxide  400 mg Oral BID  ? melatonin  3 mg Oral QHS  ? multivitamin with minerals  1 tablet Oral Daily  ? pantoprazole (PROTONIX) IV  40 mg Intravenous BID  ? potassium chloride  40 mEq Oral Daily  ? sodium chloride flush  10-40 mL Intracatheter Q12H  ? tamsulosin  0.4 mg Oral Daily  ? ?Continuous Infusions: ? sodium chloride 75 mL/hr at 02/13/22 0735  ? cefTRIAXone (ROCEPHIN)  IV 1 g  (02/12/22 1615)  ? octreotide  (SANDOSTATIN)    IV infusion 50 mcg/hr (02/13/22 1251)  ? ? ?Data Reviewed: ? ?Basic Metabolic Panel: ?Recent Labs  ?Lab 02/09/22 ?1123 02/10/22 ?0425 02/11/22 ?0256 02/12/22 ?0128 02/13/22 ?0115  ?NA 141 142 141 136 137  ?K 3.2* 2.8* 3.3* 4.0 3.7  ?CL 121* 120* 112* 106 104  ?CO2 9* 12* 17* 21* 24  ?GLUCOSE 120* 121* 142* 152* 135*  ?BUN 42* 38* 30* 32* 32*  ?CREATININE 4.43* 4.04* 4.00* 3.60* 3.51*  ?CALCIUM 8.5* 8.1* 8.4* 7.6* 7.5*  ?MG 1.3* 1.5* 1.9  --   --   ?PHOS  --  2.4* 1.5* 1.8* 2.3*  ? ? ?CBC: ?Recent Labs  ?Lab 02/09/22 ?1123 02/10/22 ?0425 02/12/22 ?1200 02/13/22 ?0115 02/13/22 ?0955  ?WBC 17.0* 8.9 16.5* 16.8*  --   ?NEUTROABS 15.0*  --  14.5* 15.0*  --   ?HGB 8.7* 7.5* 7.0* 6.4* 7.0*  ?HCT 27.1* 23.6* 20.9* 19.5* 20.5*  ?MCV 98.9 97.5 92.9 94.7  --   ?PLT 237 188 207 174  --   ? ? ?Studies: ?DG Chest Port 1 View ? ?Result Date: 02/12/2022 ?CLINICAL DATA:  Fever. EXAM: PORTABLE CHEST 1 VIEW COMPARISON:  Chest radiograph 02/09/2022 FINDINGS: Left subclavian power injectable port central venous catheter tip projects just below the level of the superior cavoatrial junction. Low lung volumes. The nodular density noted in the anterior left upper lobe on CT 02/06/2022 is not appreciated. No new focal consolidation, pleural effusion, or pneumothorax. No acute osseous abnormality. IMPRESSION: No acute cardiopulmonary abnormality. Electronically Signed   By: Aaron Johnston M.D.   On: 02/12/2022 11:39   ? ?Principal Problem: ?  Acute kidney injury superimposed on chronic kidney disease (Aaron Johnston) secondary to BPH with urinary obstruction ?Active Problems: ?  BPH with urinary obstruction ?  Primary cancer of head of pancreas (Aaron Johnston) ?  Leukocytosis ?  Normocytic anemia ?  Hypomagnesemia ?  Hypokalemia ?  GERD (gastroesophageal reflux disease) ?  Controlled type 2 diabetes mellitus without complication, without long-term current use of insulin ([place]) ?  Insomnia ? ? ? ? ?[name], ?[facility] ? ?If 7PM-7AM, please contact night-coverage ?[email or web address] ? ? LOS: 4 days  ? ?

## 2022-02-13 NOTE — Progress Notes (Signed)
[facility] Dr, Dena Billet paged informed of Critical HGB 6.4. awaiting orders will continue to monitor ?

## 2022-02-13 NOTE — Consult Note (Signed)
[facility] Consultation Note ? ?Referring Provider: [facility] ?Primary Care Physician:  [name], [name], MD ?Primary Gastroenterologist:  Dr. Alessandra Bevels ? ?Reason for Consultation:  anemia, hemoccult-positive stool. ? ?HPI: Aaron Johnston is a 76 y.o. male metastatic (to liver) pancreatic cancer.  Admitted for failure to thrive, acute renal failure.  Has had drop in hemoglobin with hemoccult-positive stools.  No abdominal pain, nausea, vomiting, hematemesis, melena, hematochezia.  History of portal hypertension in setting of liver metastases or pancreatic cancer, has had endoscopic variceal band ligation on a couple of occasions (initial banding in November 2022 after patient experienced acute hematemesis and then surveillance banding in December). ? ? ?Past Medical History:  ?Diagnosis Date  ? Acute kidney injury superimposed on chronic kidney disease ([place]) 02/09/2022  ? Cancer [facility])   ? Diabetes mellitus without complication ([place])   ? Family history of breast cancer   ? Family history of colon cancer   ? Family history of pancreatic cancer   ? Family history of stomach cancer   ? ? ?Past Surgical History:  ?Procedure Laterality Date  ? BIOPSY  08/29/2021  ? Procedure: BIOPSY;  Surgeon: Otis Brace, MD;  Location: [facility];  Service: Gastroenterology;;  ? COLONOSCOPY WITH PROPOFOL N/A 08/29/2021  ? Procedure: COLONOSCOPY WITH PROPOFOL;  Surgeon: Otis Brace, MD;  Location: [facility];  Service: Gastroenterology;  Laterality: N/A;  ? ESOPHAGEAL BANDING  09/17/2021  ? Procedure: ESOPHAGEAL BANDING;  Surgeon: Otis Brace, MD;  Location: [facility];  Service: Gastroenterology;;  ? ESOPHAGEAL BANDING N/A 11/03/2021  ? Procedure: ESOPHAGEAL BANDING;  Surgeon: Otis Brace, MD;  Location: [facility];  Service: Gastroenterology;  Laterality: N/A;  ? ESOPHAGOGASTRODUODENOSCOPY (EGD) WITH PROPOFOL N/A 08/27/2021  ? Procedure: ESOPHAGOGASTRODUODENOSCOPY (EGD) WITH PROPOFOL;   Surgeon: Clarene Essex, MD;  Location: [facility];  Service: Endoscopy;  Laterality: N/A;  ? ESOPHAGOGASTRODUODENOSCOPY (EGD) WITH PROPOFOL N/A 09/17/2021  ? Procedure: ESOPHAGOGASTRODUODENOSCOPY (EGD) WITH PROPOFOL;  Surgeon: Otis Brace, MD;  Location: [facility];  Service: Gastroenterology;  Laterality: N/A;  ? ESOPHAGOGASTRODUODENOSCOPY (EGD) WITH PROPOFOL N/A 11/03/2021  ? Procedure: ESOPHAGOGASTRODUODENOSCOPY (EGD) WITH PROPOFOL;  Surgeon: Otis Brace, MD;  Location: [facility];  Service: Gastroenterology;  Laterality: N/A;  ? ? ?Prior to Admission medications   ?Medication Sig Start Date End Date Taking? Authorizing Provider  ?diphenoxylate-atropine (LOMOTIL) 2.5-0.025 MG tablet Take 1-2 tablets by mouth 4 (four) times daily as needed for diarrhea or loose stools. ?Patient taking differently: Take 1 tablet by mouth 4 (four) times daily as needed for diarrhea or loose stools. 12/14/21  Yes Owens Shark, NP  ?HYDROcodone-acetaminophen (NORCO/VICODIN) 5-325 MG tablet Take 1-2 tablets by mouth every 4 (four) hours as needed. ?Patient taking differently: Take 1 tablet by mouth every 4 (four) hours as needed. 01/04/22  Yes Owens Shark, NP  ?latanoprost (XALATAN) 0.005 % ophthalmic solution Place 1 drop into both eyes at bedtime.   Yes [provider]  ?lidocaine-prilocaine (EMLA) cream Apply 1 application topically as directed. Apply to port site 1 hour prior to stick and cover with plastic wrap ?Patient taking differently: Apply 1 application. topically as needed (chemo port site). 10/19/21  Yes Owens Shark, NP  ?lipase/protease/amylase (CREON) 36000 UNITS CPEP capsule Take 2 capsules (72,000 Units total) by mouth 3 (three) times daily with meals. May also take 1 capsule (36,000 Units total) as needed. ?Patient taking differently: 2 capsules (72,000units) 3 times daily with meal. An additional 1 capsule(36,000 units) for snack 08/06/21  Yes Owens Shark, NP  ?loperamide (  IMODIUM) 2 MG  capsule Take 4 mg by mouth 3 (three) times daily as needed for diarrhea or loose stools.   Yes [provider]  ?loratadine (CLARITIN) 10 MG tablet Take 10 mg by mouth daily.   Yes [provider]  ?magnesium oxide (MAG-OX) 400 (240 Mg) MG tablet TAKE 1 TABLET BY MOUTH TWICE A DAY ?Patient taking differently: Take 400 mg by mouth 2 (two) times daily. 01/08/22  Yes Ladell Pier, MD  ?pantoprazole (PROTONIX) 40 MG tablet TAKE 1 TABLET BY MOUTH TWICE A DAY ?Patient taking differently: 40 mg 2 (two) times daily. 12/28/21  Yes Ladell Pier, MD  ?pioglitazone (ACTOS) 30 MG tablet Take 30 mg by mouth daily. 02/01/22  Yes [provider]  ?potassium chloride SA (KLOR-CON M20) 20 MEQ tablet Take 1 tablet (20 mEq total) by mouth 2 (two) times daily. 12/28/21  Yes Ladell Pier, MD  ?prochlorperazine (COMPAZINE) 10 MG tablet Take 1 tablet (10 mg total) by mouth every 6 (six) hours as needed for nausea. 01/01/22  Yes Owens Shark, NP  ?propranolol (INDERAL) 10 MG tablet TAKE 1 TABLET BY MOUTH TWICE A DAY ?Patient taking differently: Take 10 mg by mouth 2 (two) times daily. 01/08/22  Yes Ladell Pier, MD  ?tadalafil (CIALIS) 20 MG tablet Take 20 mg by mouth daily as needed for erectile dysfunction. 06/11/21  Yes [provider]  ?Accu-Chek FastClix Lancets MISC Apply topically. 08/30/20   [provider]  ?ACCU-CHEK GUIDE test strip  08/31/20   [provider]  ?Blood Glucose Monitoring Suppl (ACCU-CHEK GUIDE) w/Device KIT See admin instructions. 08/03/20   [provider]  ? ? ?Current Facility-Administered Medications  ?Medication Dose Route Frequency Provider Last Rate Last Admin  ? 0.9 %  sodium chloride infusion (Manually program via Guardrails IV Fluids)   Intravenous Once Tacey Ruiz, MD      ? 0.9 %  sodium chloride infusion (Manually program via Guardrails IV Fluids)   Intravenous Once Bonnell Public Tublu, MD      ? 0.9 %  sodium chloride  infusion   Intravenous Continuous Tacey Ruiz, MD 75 mL/hr at 02/13/22 0735 New Bag at 02/13/22 0735  ? acetaminophen (TYLENOL) tablet 650 mg  650 mg Oral Q6H PRN Norval Morton, MD   650 mg at 02/13/22 0853  ? Or  ? acetaminophen (TYLENOL) suppository 650 mg  650 mg Rectal Q6H PRN Fuller Plan A, MD      ? albuterol (PROVENTIL) (2.5 MG/3ML) 0.083% nebulizer solution 2.5 mg  2.5 mg Nebulization Q6H PRN Tamala Julian, Rondell A, MD      ? cefTRIAXone (ROCEPHIN) 1 g in sodium chloride 0.9 % 100 mL IVPB  1 g Intravenous Q24H Derrill Kay A, MD 200 mL/hr at 02/12/22 1615 1 g at 02/12/22 1615  ? Chlorhexidine Gluconate Cloth 2 % PADS 6 each  6 each Topical Daily Norval Morton, MD   6 each at 02/12/22 6195  ? feeding supplement (ENSURE ENLIVE / ENSURE PLUS) liquid 237 mL  237 mL Oral BID BM Smith, Rondell A, MD   237 mL at 02/12/22 1453  ? HYDROcodone-acetaminophen (NORCO/VICODIN) 5-325 MG per tablet 1 tablet  1 tablet Oral Q4H PRN Smith, Rondell A, MD      ? latanoprost (XALATAN) 0.005 % ophthalmic solution 1 drop  1 drop Both Eyes QHS Smith, Rondell A, MD   1 drop at 02/12/22 2114  ? lidocaine-prilocaine (EMLA) cream 1 application.  1 application.  Topical PRN Norval Morton, MD      ? lipase/protease/amylase (CREON) capsule 36,000 Units  36,000 Units Oral PRN Fuller Plan A, MD      ? lipase/protease/amylase (CREON) capsule 72,000 Units  72,000 Units Oral TID WC Fuller Plan A, MD   72,000 Units at 02/12/22 1615  ? loperamide (IMODIUM) capsule 4 mg  4 mg Oral TID PRN Fuller Plan A, MD   4 mg at 02/12/22 2115  ? loratadine (CLARITIN) tablet 10 mg  10 mg Oral Daily Fuller Plan A, MD   10 mg at 02/12/22 0831  ? LORazepam (ATIVAN) tablet 0.25 mg  0.25 mg Oral QHS PRN Fuller Plan A, MD      ? magnesium oxide (MAG-OX) tablet 400 mg  400 mg Oral BID Tamala Julian, Rondell A, MD   400 mg at 02/12/22 2115  ? melatonin tablet 3 mg  3 mg Oral QHS Smith, Rondell A, MD   3 mg at 02/12/22 2115  ? multivitamin with minerals  tablet 1 tablet  1 tablet Oral Daily Derrill Kay A, MD   1 tablet at 02/12/22 2379  ? octreotide (SANDOSTATIN) 500 mcg in sodium chloride 0.9 % 250 mL (2 mcg/mL) infusion  50 mcg/hr Intravenous Co

## 2022-02-13 NOTE — Progress Notes (Addendum)
CROSS COVER NOTE ? ? ?Pasty Arch ?[id number] ? ? ?S: ? ?Nurse called with Hb level 6.4. ? ?Discussed with patient and wife at bedside. ?Pt reports has not noticed any bleeding. ?Patient reports he has had transfusions before and has not ever had a transfusion reaction. ?Has fatigue. ? ?O: ?Blood pressure (!) 107/59, pulse 99, temperature 98.4 ?F (36.9 ?C), temperature source Oral, resp. rate 15, height 5' 9.02" (1.753 m), weight 67.5 kg, SpO2 99 %. ? ?Exam: ?Physical Exam: ?GENERAL: Ill-appearing, well-developed.  ?RESPIRATORY: Chest wall movements are symmetric. No use of accessory muscles to breathe.  No tachypnea. ?NEUROLOGICAL: Cranial nerves 2-12 grossly intact.  ? ? ? ?A/P: ? ?Anemia. ?Symptomatic. ? ?Plan: ?Discussed risks/benefits/alternatives of transfusion with patient. ?Patient consents to transfusion. ?Blood consent signed and witnessed. ? ?Transfuse 1 unit PRBCs. ?Hemoccult of stool ordered. ?Discussed plan with nurse. ?Further evaluation and treatment per daytime attending. ? ?______ ? ?UPDATE: ? ?Hemoccult is positive for blood. ? ?Acute GI bleed. ?Plan:   ?IV pantoprazole q12 hours.  ?Monitor H/H.  ?1 unit PRBCs already ordered. ?Consult GI in the AM: electronic order placed for GI consult but did not call GI.  ? ?______ ? ?UPDATE: ? ?Acute GI bleed. ?Discussed case with GI on call, who will consult. ?He recommended octreotide due to patient's history of varices.  I have ordered the octreotide. ?He also recommended patient be n.p.o. and be given IV pantoprazole twice a day. ?IV pantoprazole already ordered. ?Patient made n.p.o. except for sips with meds. ?Started IV fluids normal saline 75/h. ? ? ? ?

## 2022-02-13 NOTE — Plan of Care (Signed)
?  Problem: Education: Goal: Knowledge of General Education information will improve Description: Including pain rating scale, medication(s)/side effects and non-pharmacologic comfort measures Outcome: Progressing   Problem: Health Behavior/Discharge Planning: Goal: Ability to manage health-related needs will improve Outcome: Progressing   Problem: Clinical Measurements: Goal: Ability to maintain clinical measurements within normal limits will improve Outcome: Progressing Goal: Will remain free from infection Outcome: Progressing Goal: Diagnostic test results will improve Outcome: Progressing Goal: Respiratory complications will improve Outcome: Progressing Goal: Cardiovascular complication will be avoided Outcome: Progressing   Problem: Nutrition: Goal: Adequate nutrition will be maintained Outcome: Progressing   

## 2022-02-14 DIAGNOSIS — N189 Chronic kidney disease, unspecified: Secondary | ICD-10-CM | POA: Diagnosis not present

## 2022-02-14 DIAGNOSIS — N179 Acute kidney failure, unspecified: Secondary | ICD-10-CM | POA: Diagnosis not present

## 2022-02-14 LAB — HEMOGLOBIN AND HEMATOCRIT, BLOOD
HCT: 27.8 % — ABNORMAL LOW (ref 39.0–52.0)
HCT: 30.2 % — ABNORMAL LOW (ref 39.0–52.0)
Hemoglobin: 9.2 g/dL — ABNORMAL LOW (ref 13.0–17.0)
Hemoglobin: 9.9 g/dL — ABNORMAL LOW (ref 13.0–17.0)

## 2022-02-14 LAB — TYPE AND SCREEN
ABO/RH(D): A POS
Antibody Screen: NEGATIVE
Unit division: 0
Unit division: 0

## 2022-02-14 LAB — BPAM RBC
Blood Product Expiration Date: 202304122359
Blood Product Expiration Date: 202305012359
ISSUE DATE / TIME: 202304010452
ISSUE DATE / TIME: 202304011542
Unit Type and Rh: 6200
Unit Type and Rh: 6200

## 2022-02-14 LAB — RENAL FUNCTION PANEL
Albumin: 2.1 g/dL — ABNORMAL LOW (ref 3.5–5.0)
Anion gap: 13 (ref 5–15)
BUN: 28 mg/dL — ABNORMAL HIGH (ref 8–23)
CO2: 21 mmol/L — ABNORMAL LOW (ref 22–32)
Calcium: 7.8 mg/dL — ABNORMAL LOW (ref 8.9–10.3)
Chloride: 108 mmol/L (ref 98–111)
Creatinine, Ser: 3.2 mg/dL — ABNORMAL HIGH (ref 0.61–1.24)
GFR, Estimated: 20 mL/min — ABNORMAL LOW (ref >60)
Glucose, Bld: 115 mg/dL — ABNORMAL HIGH (ref 70–99)
Phosphorus: 3.9 mg/dL (ref 2.5–4.6)
Potassium: 3.2 mmol/L — ABNORMAL LOW (ref 3.5–5.1)
Sodium: 142 mmol/L (ref 135–145)

## 2022-02-14 LAB — GLUCOSE, CAPILLARY: Glucose-Capillary: 91 mg/dL (ref 70–99)

## 2022-02-14 MED ORDER — POTASSIUM CHLORIDE CRYS ER 20 MEQ PO TBCR
40.0000 meq | EXTENDED_RELEASE_TABLET | Freq: Once | ORAL | Status: AC
Start: 2022-02-14 — End: 2022-02-14
  Administered 2022-02-14: 40 meq via ORAL
  Filled 2022-02-14: qty 2

## 2022-02-14 MED ORDER — PANTOPRAZOLE SODIUM 40 MG PO TBEC
40.0000 mg | DELAYED_RELEASE_TABLET | Freq: Two times a day (BID) | ORAL | Status: DC
Start: 2022-02-14 — End: 2022-02-16
  Administered 2022-02-14 – 2022-02-16 (×4): 40 mg via ORAL
  Filled 2022-02-14 (×4): qty 1

## 2022-02-14 MED ORDER — OCTREOTIDE ACETATE 500 MCG/ML IJ SOLN
50.0000 ug/h | INTRAMUSCULAR | Status: AC
  Administered 2022-02-14 – 2022-02-15 (×2): 50 ug/h via INTRAVENOUS
  Filled 2022-02-14 (×3): qty 1

## 2022-02-14 NOTE — Progress Notes (Addendum)
? ?PROGRESS NOTE ? ? ? ?Pasty Arch  ?UEA:[id number]  ?DOB: Feb 12, 1946  ?DOA: 02/09/2022 ?PCP: [name], [name], MD ?Outpatient Specialists: ? ? 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  Patient was treated with IV fluid resuscitation with improvement of renal function.  He was scheduled to go home however subsequently had fever and diagnosed with CAUTI.  Patient was started on ceftriaxone. ? ?Overnight 02/12/2022, patient was noted to have hypotension, drop in H&H and guaiac positive stool.  He was started on octreotide and PPI and transfused 1 unit PRBC.  Patient responded well to IV fluids and transfusion, H&H have increased appropriately. ? ? ?Subjective: ? ?Patient states he does feel somewhat better since transfusion, wife notes that he has more energy and is eating better. ? ?Objective: ?Vitals:  ? 02/13/22 2326 02/14/22 4782 02/14/22 0842 02/14/22 1223  ?BP: 99/62 120/74 111/70 111/77  ?Pulse: 80 83 83 81  ?Resp: '19 12 16 18  '$ ?Temp: 98.4 ?F (36.9 ?C) 98 ?F (36.7 ?C) 98.1 ?F (36.7 ?C) 97.6 ?F (36.4 ?C)  ?TempSrc: Oral Oral Oral Oral  ?SpO2: 96% 94% 96% 92%  ?Weight:      ?Height:      ? ? ?Intake/Output Summary (Last 24 hours) at 02/14/2022 1525 ?Last data filed at 02/14/2022 1136 ?Gross per 24 hour  ?Intake 5997.35 ml  ?Output 875 ml  ?Net 5122.35 ml  ? ? ?Filed Weights  ? 02/10/22 1903  ?Weight: 67.5 kg  ? ? ? ?Exam: ? ?General: Comfortable appearing thin man sitting up in bed smiling, more interactive than yesterday ?Eyes: sclera anicteric, conjuctiva mild injection bilaterally ?CVS: S1-S2, regular  ?Respiratory:  decreased air entry bilaterally secondary to decreased inspiratory effort, rales at bases  ?GI: NABS, soft, NT  ?LE: No edema.  ?Neuro: A/O x 3, Moving all extremities equally with normal strength, CN 3-12 intact, grossly nonfocal.   ?Psych: patient is logical and coherent, judgement and insight appear normal, mood and affect appropriate to situation. ? ? ?Assessment & Plan: ?  ?Anemia with hypotension with concern for GI bleed ?Patient is responded well to transfusion and IV fluids ?H&H have responded well to 2 units PRBC, now 9 ?Decrease IV fluids to 50 cc an hour and follow blood pressure ?Noncon CT abdomen pelvis done yesterday shows thickened bowel loops consistent with small bowel enteritis and colitis however patient without diarrhea or abdominal pain.  Abdominal exam is benign, although this might account for persistent leukocytosis ?We will follow clinically ?Continue octreotide x48 hours then DC ?Continue PPI IV, patient can be discharged on p.o. PPI if warranted ? ?CAUTI ?Continue ceftriaxone day #3 ?Discontinue Foley catheter today ? ?BPH ?Voiding trial after discontinuation of Foley catheter had previously been cleared by urology ?Continue tamsulosin ?Bladder scan prior to discharge to make sure patient is voiding appropriately ? ?AKI on CKD ?Creatinine, continues to improve modestly but continuously ? ?DM 2 ?Blood sugars normal on diet control ?Last hemoglobin A1c was 6.1 on Actos which is being held ? ?Pancreatic CA ?Metastatic to liver ?Diagnosed 2021, presently on chemotherapy by Dr. Learta Codding ?On 5-FU, leucovorin and irinotecan ? ? ?DVT prophylaxis: SCD ?Code Status: Full ?Family Communication: Patient's wife was at bedside throughout ?Disposition Plan:  ? Patient is from: Home ? Anticipated Discharge Location: TBD ? Barriers to Discharge: Hypotension ? Is patient medically stable for Discharge: No ? ? ?  Scheduled Meds: ? sodium chloride   Intravenous Once  ? Chlorhexidine Gluconate Cloth  6 each Topical Daily  ? feeding supplement  237 mL Oral BID BM  ? latanoprost  1 drop Both Eyes QHS  ? lipase/protease/amylase  72,000 Units Oral TID WC  ? loratadine  10 mg Oral Daily  ? magnesium oxide  400 mg Oral BID  ? melatonin  3 mg Oral  QHS  ? multivitamin with minerals  1 tablet Oral Daily  ? pantoprazole  40 mg Oral BID  ? potassium chloride  40 mEq Oral Daily  ? sodium chloride flush  10-40 mL Intracatheter Q12H  ? tamsulosin  0.4 mg Oral Daily  ? ?Continuous Infusions: ? sodium chloride 125 mL/hr at 02/14/22 1042  ? cefTRIAXone (ROCEPHIN)  IV 1 g (02/13/22 1633)  ? octreotide  (SANDOSTATIN)    IV infusion 50 mcg/hr (02/14/22 1245)  ? ? ?Data Reviewed: ? ?Basic Metabolic Panel: ?Recent Labs  ?Lab 02/09/22 ?1123 02/10/22 ?0425 02/11/22 ?0256 02/12/22 ?0128 02/13/22 ?0115 02/14/22 ?0400  ?NA 141 142 141 136 137 142  ?K 3.2* 2.8* 3.3* 4.0 3.7 3.2*  ?CL 121* 120* 112* 106 104 108  ?CO2 9* 12* 17* 21* 24 21*  ?GLUCOSE 120* 121* 142* 152* 135* 115*  ?BUN 42* 38* 30* 32* 32* 28*  ?CREATININE 4.43* 4.04* 4.00* 3.60* 3.51* 3.20*  ?CALCIUM 8.5* 8.1* 8.4* 7.6* 7.5* 7.8*  ?MG 1.3* 1.5* 1.9  --   --   --   ?PHOS  --  2.4* 1.5* 1.8* 2.3* 3.9  ? ? ? ?CBC: ?Recent Labs  ?Lab 02/09/22 ?1123 02/10/22 ?0425 02/12/22 ?1200 02/13/22 ?0115 02/13/22 ?0955 02/13/22 ?2200 02/14/22 ?0400 02/14/22 ?1204  ?WBC 17.0* 8.9 16.5* 16.8*  --   --   --   --   ?NEUTROABS 15.0*  --  14.5* 15.0*  --   --   --   --   ?HGB 8.7* 7.5* 7.0* 6.4* 7.0* 9.0* 9.2* 9.9*  ?HCT 27.1* 23.6* 20.9* 19.5* 20.5* 26.6* 27.8* 30.2*  ?MCV 98.9 97.5 92.9 94.7  --   --   --   --   ?PLT 237 188 207 174  --   --   --   --   ? ? ? ?Studies: ?CT ABDOMEN PELVIS WO CONTRAST ? ?Result Date: 02/13/2022 ?CLINICAL DATA:  UGI bleed, nonvariceal, postsurgical or traumatic, endoscopy contraindicated Retroperitoneal hematoma, known or suspected (Ped 0-17y) h/o varices and pancreatic ca but no hematemesis or melena EXAM: CT ABDOMEN AND PELVIS WITHOUT CONTRAST TECHNIQUE: Multidetector CT imaging of the abdomen and pelvis was performed following the standard protocol without IV contrast. RADIATION DOSE REDUCTION: This exam was performed according to the departmental dose-optimization program which includes automated  exposure control, adjustment of the mA and/or kV according to patient size and/or use of iterative reconstruction technique. COMPARISON:  02/06/2022 FINDINGS: Lower chest: Trace left pleural effusion.  Bibasilar scarring. Hepatobiliary: Numerous hepatic metastases are again noted, unchanged since recent study. Gallbladder is mildly distended, stable. No visible stones or biliary ductal dilatation. Pancreas: Limited evaluation without intravenous contrast and due to surrounding mesenteric edema. Indistinct, mildly prominent pancreatic head region with atrophy of the body and tail. No change since recent study. Spleen: Splenomegaly.  Low-density lesion anteriorly is unchanged. Adrenals/Urinary Tract: No adrenal abnormality. No focal renal abnormality. No stones or hydronephrosis. Foley catheter in the bladder which is decompressed. Stomach/Bowel: Evaluation limited due to lack of oral and IV contrast and mesenteric edema and ascites. There  is apparent wall thickening seen throughout much of the colon, most pronounced in the right colon and proximal transverse colon. There also appears to be wall thickening within mildly prominent left abdominal small bowel loops. Findings concerning for colitis and enteritis. Vascular/Lymphatic: Aortic atherosclerosis. No evidence of aneurysm or adenopathy. Reproductive: Mildly prominent prostate. Other: Moderate ascites, increasing since prior study. Edema throughout the mesentery also increasing since prior study. Musculoskeletal: No acute bony abnormality. Right inguinal hernia containing fat and ascites. IMPRESSION: Indistinct, prominent appearance of the pancreatic head is similar to prior study with pancreatic body and tail atrophy, unchanged. Hepatic metastases are stable since recent study. Stable splenomegaly and anterior low-density lesion which cannot be characterized on this noncontrast study. Moderate ascites, increasing since prior study. Increasing mesenteric edema. No  evidence for retroperitoneal hematoma. Wall thickening throughout much of the colon, most pronounced in the right colon and transverse colon compatible with colitis. Wall thickening also seen within left

## 2022-02-14 NOTE — Plan of Care (Signed)
Pt able to eat some full liquids this shift.  Pt had diarrhea several times throughout shift.  Medicated with imodium per [facility] for this.  Pt able to walk to and from bathroom with help from spouse.  Steady on feet.   ?Problem: Nutrition: ?Goal: Adequate nutrition will be maintained ?Outcome: Progressing ?  ?

## 2022-02-14 NOTE — Plan of Care (Signed)
?  Problem: Education: Goal: Knowledge of General Education information will improve Description: Including pain rating scale, medication(s)/side effects and non-pharmacologic comfort measures Outcome: Progressing   Problem: Health Behavior/Discharge Planning: Goal: Ability to manage health-related needs will improve Outcome: Progressing   Problem: Clinical Measurements: Goal: Ability to maintain clinical measurements within normal limits will improve Outcome: Progressing Goal: Will remain free from infection Outcome: Progressing Goal: Diagnostic test results will improve Outcome: Progressing Goal: Respiratory complications will improve Outcome: Progressing Goal: Cardiovascular complication will be avoided Outcome: Progressing   Problem: Nutrition: Goal: Adequate nutrition will be maintained Outcome: Progressing   

## 2022-02-14 NOTE — Progress Notes (Signed)
Brief PCCM Progress Note ? ?BP/HR and Hb stable. ? ?Will sign off but glad to be reinvolved as condition changes ? ?Walker Shadow  ?[place] ? ?

## 2022-02-14 NOTE — Progress Notes (Signed)
Subjective: ?No blood in stool or hematemesis. ?No abdominal pain. ? ?Objective: ?Vital signs in last 24 hours: ?Temp:  [97.8 ?F (36.6 ?C)-98.5 ?F (36.9 ?C)] 98.1 ?F (36.7 ?C) (04/02 1829) ?Pulse Rate:  [78-88] 83 (04/02 0842) ?Resp:  [11-20] 16 (04/02 0842) ?BP: (83-120)/(49-74) 111/70 (04/02 9371) ?SpO2:  [92 %-100 %] 96 % (04/02 0842) ?Weight change:  ?Last BM Date : 02/13/22 ? ?PE: ?GEN:  Thin, cachectic, chronically-ill appearing but NAD ?NEURO:  A/O ?HEENT:  Anicteric ?MSK:  Diffuse symmetrical atrophy. ? ?Lab Results: ?CBC ?   ?Component Value Date/Time  ? WBC 16.8 (H) 02/13/2022 0115  ? RBC 2.06 (L) 02/13/2022 0115  ? HGB 9.2 (L) 02/14/2022 0400  ? HGB 8.5 (L) 02/04/2022 0935  ? HCT 27.8 (L) 02/14/2022 0400  ? PLT 174 02/13/2022 0115  ? PLT 67 (L) 02/04/2022 0935  ? MCV 94.7 02/13/2022 0115  ? MCH 31.1 02/13/2022 0115  ? MCHC 32.8 02/13/2022 0115  ? RDW 17.2 (H) 02/13/2022 0115  ? LYMPHSABS 0.5 (L) 02/13/2022 0115  ? MONOABS 0.9 02/13/2022 0115  ? EOSABS 0.0 02/13/2022 0115  ? BASOSABS 0.0 02/13/2022 0115  ?CMP  ?   ?Component Value Date/Time  ? NA 142 02/14/2022 0400  ? K 3.2 (L) 02/14/2022 0400  ? CL 108 02/14/2022 0400  ? CO2 21 (L) 02/14/2022 0400  ? GLUCOSE 115 (H) 02/14/2022 0400  ? BUN 28 (H) 02/14/2022 0400  ? CREATININE 3.20 (H) 02/14/2022 0400  ? CREATININE 3.22 (HH) 02/04/2022 0935  ? CALCIUM 7.8 (L) 02/14/2022 0400  ? PROT 6.1 (L) 02/06/2022 0306  ? ALBUMIN 2.1 (L) 02/14/2022 0400  ? AST 15 02/06/2022 0306  ? AST 19 01/26/2022 0825  ? ALT 25 02/06/2022 0306  ? ALT 31 01/26/2022 0825  ? ALKPHOS 254 (H) 02/06/2022 0306  ? BILITOT 1.1 02/06/2022 0306  ? BILITOT 0.9 01/26/2022 0825  ? GFRNONAA 20 (L) 02/14/2022 0400  ? GFRNONAA 20 (L) 02/04/2022 0935  ? GFRAA NOT CALCULATED 02/23/2021 0959  ? ?CT w/o contrast, no retroperitoneal bleed ? ?Assessment: ? ? Anemia, acute on chronic.  Differential diagnosis extensive includes renal failure-mediated, chemotherapy-mediated, slow GI bleeding (oozing from  portal gastropathy, oozing from recently variceal banding ulcers), retroperitoneal/peripancreatic/intrahepatic bleeding, other.   ?Hemoccult-positive stool.  No evidence of melena, hematemesis, hematochezia.  History not consistent with acute variceal bleeding. ?Metastatic pancreatic cancer. ?Acute renal failure. ? ?Plan: ? ? Pantoprazole 40 mg bid. ?Diet ok as tolerated. ?No plans for any type of endoscopic evaluation in absence of acute destabilizing hemorrhage. ?Octreotide x 48 more hours then stop. ?Prognosis guarded. ?[facility] will sign-off; please call with questions; thank you for the consultation. ? ? Landry Dyke ?02/14/2022, 9:35 AM ? ? ?Cell [phone] ?If no answer or after 5 PM call [phone]  ?

## 2022-02-15 DIAGNOSIS — N189 Chronic kidney disease, unspecified: Secondary | ICD-10-CM | POA: Diagnosis not present

## 2022-02-15 DIAGNOSIS — N179 Acute kidney failure, unspecified: Secondary | ICD-10-CM | POA: Diagnosis not present

## 2022-02-15 LAB — C DIFFICILE (CDIFF) QUICK SCRN (NO PCR REFLEX)
C Diff antigen: NEGATIVE
C Diff interpretation: NOT DETECTED
C Diff toxin: NEGATIVE

## 2022-02-15 LAB — RENAL FUNCTION PANEL
Albumin: 2.1 g/dL — ABNORMAL LOW (ref 3.5–5.0)
Anion gap: 6 (ref 5–15)
BUN: 26 mg/dL — ABNORMAL HIGH (ref 8–23)
CO2: 18 mmol/L — ABNORMAL LOW (ref 22–32)
Calcium: 7.6 mg/dL — ABNORMAL LOW (ref 8.9–10.3)
Chloride: 115 mmol/L — ABNORMAL HIGH (ref 98–111)
Creatinine, Ser: 3.15 mg/dL — ABNORMAL HIGH (ref 0.61–1.24)
GFR, Estimated: 20 mL/min — ABNORMAL LOW (ref >60)
Glucose, Bld: 147 mg/dL — ABNORMAL HIGH (ref 70–99)
Phosphorus: 3.1 mg/dL (ref 2.5–4.6)
Potassium: 3.7 mmol/L (ref 3.5–5.1)
Sodium: 139 mmol/L (ref 135–145)

## 2022-02-15 LAB — CBC
HCT: 30.9 % — ABNORMAL LOW (ref 39.0–52.0)
Hemoglobin: 9.8 g/dL — ABNORMAL LOW (ref 13.0–17.0)
MCH: 31.1 pg (ref 26.0–34.0)
MCHC: 31.7 g/dL (ref 30.0–36.0)
MCV: 98.1 fL (ref 80.0–100.0)
Platelets: 253 10*3/uL (ref 150–400)
RBC: 3.15 MIL/uL — ABNORMAL LOW (ref 4.22–5.81)
RDW: 18 % — ABNORMAL HIGH (ref 11.5–15.5)
WBC: 19.9 10*3/uL — ABNORMAL HIGH (ref 4.0–10.5)
nRBC: 0 % (ref 0.0–0.2)

## 2022-02-15 LAB — URINE CULTURE: Culture: >=100000 — AB

## 2022-02-15 NOTE — Progress Notes (Signed)
IP PROGRESS NOTE ? ?Subjective:  ? ?[name] remained hospitalized over the weekend after developing fever and progressive anemia.  He is being treated for urinary tract infection.  He received a Red cell transfusion 02/13/2022. ?[name] feels well today.  No bleeding or nausea.  Intermittent abdominal pain lasting for minutes.  He wants to go home. ?. ?Objective: ?Vital signs in last 24 hours: ?Blood pressure 118/86, pulse 83, temperature 97.9 ?F (36.6 ?C), temperature source Oral, resp. rate 16, height 5' 9.02" (1.753 m), weight 148 lb 13 oz (67.5 kg), SpO2 100 %. ? ?Intake/Output from previous day: ?04/02 0701 - 04/03 0700 ?In: -  ?Out: 425 [Urine:425] ? ?Physical Exam: ? ?HEENT: No thrush ?Abdomen: Nontender, no hepatosplenomegaly, no mass, mildly distended ?Extremities: No leg edema ? ? ?Portacath/PICC-without erythema ? ?Lab Results: ?Recent Labs  ?  02/13/22 ?0115 02/13/22 ?4235 02/14/22 ?1204 02/15/22 ?0310  ?WBC 16.8*  --   --  19.9*  ?HGB 6.4*   < > 9.9* 9.8*  ?HCT 19.5*   < > 30.2* 30.9*  ?PLT 174  --   --  253  ? < > = values in this interval not displayed.  ? ? ?BMET ?Recent Labs  ?  02/14/22 ?0400 02/15/22 ?0310  ?NA 142 139  ?K 3.2* 3.7  ?CL 108 115*  ?CO2 21* 18*  ?GLUCOSE 115* 147*  ?BUN 28* 26*  ?CREATININE 3.20* 3.15*  ?CALCIUM 7.8* 7.6*  ? ? ?Lab Results  ?Component Value Date  ? TIR443 1,540 (H) 01/26/2022  ? ? ?Medications: I have reviewed the patient's current medications. ? ?Assessment/Plan: ? ?Pancreas cancer ?CT urology center 03/04/2020-haziness of the fat adjacent to the celiac axis and SMA with focal narrowing of the SMV, splenic vein, and splenoportal confluence ?CT 07/07/2020-infiltrative retroperitoneal mass with encasement of the celiac axis and SMA, high-grade narrowing of the proximal portal vein with cavernous transformation, nonocclusive thrombus within the main portal vein, mild biliary ductal dilatation, diffuse hypodense/hypoenhancing areas within the liver (edema versus  infiltrating neoplasm), 2 x 1.7 cm area of focal prominence in the pancreas ?EUS 07/10/2020-no pancreas mass identified, 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, 1.5 cm aortocaval node biopsy-scant lymphoid material, no malignancy ?Diagnostic laparoscopy 07/17/2020-segment 3 and 4 liver nodules, adenocarcinoma, positive for pankeratin, CK7 and CK20, partially positive for TTF-1, negative for CDX2 ?Foundation 1-microsatellite stable, tumor mutation burden 4, K-ras G12D, subclonal RB1 alteration ?Elevated CA 19-9 ?Cycle 1 FOLFOX 07/28/2020 ?Cycle 2 FOLFOX 08/18/2020, oxaliplatin dose reduced due to thrombocytopenia ?Cycle 3 FOLFOX 09/08/2020 ?Cycle 4 FOLFOX 09/29/2020  ?Cycle 5 FOLFOX 10/20/2020 ?CTs 11/06/2020-grossly stable infiltrative mass within the pancreatic head and porta hepatis.  New small low-density liver lesions.  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 ?Cycle 6 FOLFOX 11/10/2021  ?Cycle 7 FOLFOX 12/01/2020 ?MRI liver 12/04/2020-no significant change in posttreatment appearance of the pancreatic head.  Numerous intrinsically low signal hypoenhancing lesions of the liver parenchyma predominantly observed in the right lobe of the liver and liver dome, measuring no greater than 8 mm and as seen on recent prior CT of the abdomen/pelvis. ?Cycle 8 FOLFOX 12/22/2020 ?Cycle 9 FOLFOX 01/12/2021 ?Cycle 10 FOLFOX 02/02/2021 ?MRI liver 02/19/2021-no change in pancreas head mass, multiple hypoenhancing liver lesions are new and increased in size, effacement of portal vein by pancreas head mass with cavernous transformation, no change in mild splenomegaly ?Cycle 1 gemcitabine/Abraxane 03/12/2021 ?Cycle  2 gemcitabine/Abraxane 03/27/2021- dose reductions secondary to neutropenia ?Cycle 3 gemcitabine/Abraxane 04/17/2021-dose reductions due to thrombocytopenia, white cell growth factor support added ?Cycle  4 gemcitabine/Abraxane 05/01/2021, Ziextenzo ?Cycle 5 gemcitabine/Abraxane 05/15/2021, Ziextenzo ?MRI abdomen 05/26/2021-mild enlargement of dominant hepatic metastases, appearance of infiltrative pancreas mass extending to the celiac trunk and SMA, splenic vein occlusion, cavernous transformation of the portal vein with upper abdomen collaterals, splenomegaly, mild upper abdominal ascites and mesenteric edema, minimal subpleural nodularity left lower lobe ?Cycle 6 gemcitabine/Abraxane 05/28/2021 ?Cycle 7 gemcitabine/Abraxane 06/12/2021 ?Cycle 8 gemcitabine/Abraxane 06/26/2021 ?Cycle 9 gemcitabine/Abraxane 07/10/2021 ?Cycle 10 gemcitabine/Abraxane 07/23/2021 ?Cycle 11 gemcitabine/Abraxane 08/06/2021 ?MRI abdomen 08/20/2021-multifocal nodularity in the lower chest with a new discrete right lower lobe nodule, enlarging subsegment 7/8 liver lesion, other small lesions appear stable, gastric varices, review of images with radiology-enlargement of at least 2 liver lesions, suspicious nodule n the right lower lung ?Cycle 1  5-FU/liposomal irinotecan 09/08/2021 ?Cycle 2  5-FU/liposomal irinotecan 09/22/2021 ?Cycle 3  5-FU/liposomal irinotecan 10/20/2021 ?Cycle 4 5-FU/liposomal irinotecan 11/04/2021 ?Cycle 5 5-FU/liposomal irinotecan 11/17/2021 ?MRI abdomen 11/27/2021-generally unchanged appearance of the pancreas with a very ill-defined hypoenhancing mass centered within the pancreatic neck, again appears to encase the celiac axis and encasing occlude the superior mesenteric vein and splenic vein; interval enlargement of multiple liver lesions; numerous lung nodules in the bilateral lung bases; unchanged splenomegaly; multiple new heterogeneously and hypoenhancing subcapsular lesions of the spleen most consistent with infarctions; small volume ascites throughout the abdomen; distended, debris-filled stomach suggesting some degree of gastric outlet and/or duodenal obstruction. ?Cycle 6 5-FU/liposomal irinotecan 12/01/2021 ?Cycle 7  5-FU/liposomal irinotecan 12/14/2021 ?Cycle 8 5-FU/liposomal irinotecan 01/12/2022 ?Cycle 9 5-FU/liposomal irinotecan 01/26/2022 ?CTs 02/06/2022-slight increase in size of liver metastases, indeterminate spleen lesion suspicious for metastasis, scattered reticulonodular densities in the lungs ?  ?Chronic thrombocytopenia- weekly Nplate beginning 6/81/1572 ?Diabetes ?Hypertension ?Abdomen/back pain secondary to #1 ?Oxaliplatin neuropathy-moderate loss of vibratory sense on exam 01/12/2021, 02/02/2021 ?Neutropenia following gemcitabine/Abraxane chemotherapy-G-CSF added beginning with cycle 3 ?8.   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   Admission with recurrent GI bleeding and severe anemia 09/15/2021 ?Upper endoscopy 09/17/2021-esophageal varices with stigmata of recent bleeding, banded.  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  Influenza A 10/13/2021 ?11.  Renal insufficiency February 2023-renal ultrasound 01/07/2022-increase renal cortical echogenicity bilaterally, mild bilateral renal pelvic fullness; referred to nephrology ?12.  Admission 02/09/2022 for evaluation and management of progressive renal failure ?13.  Klebsiella urinary tract infection 02/12/2022 ?  ?[name] appears stable.  The creatinine is partially improved.  He is being treated for a urinary tract infection.  He received Red cell transfusion over the weekend.  He appears stable for discharge from an oncology standpoint. ? ?I discussed the prognosis and  treatment options for pancreas cancer with [name] and his wife again today.  He continues to think about "comfort "care.  He is scheduled for follow-up at the Cancer center tomorrow.  This will be rescheduled for

## 2022-02-15 NOTE — Progress Notes (Addendum)
? ?PROGRESS NOTE ? ? ? ?Pasty Arch  ?VZD:[id number]  ?DOB: [birth year]  ?DOA: 02/09/2022 ?PCP: [name], [name], MD ?Outpatient Specialists: ? ? 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  Patient was treated with IV fluid resuscitation with improvement of renal function.  He was scheduled to go home however subsequently had fever and diagnosed with CAUTI.  Patient was started on ceftriaxone. ? ?Overnight 02/12/2022, patient was noted to have hypotension, drop in H&H and guaiac positive stool.  He was started on octreotide and PPI and transfused 1 unit PRBC.  Patient responded well to IV fluids and transfusion, H&H have increased appropriately. ? ? ?Subjective: ? ?Patient is tired today.  Does feel better after discontinuation of Foley.  No trouble urinating.  However would like to go home.  Reviewed with patient and his wife the fact that persistent leukocytosis is concerning to me, they are willing to stay another day. ? ?Objective: ?Vitals:  ? 02/15/22 0347 02/15/22 0808 02/15/22 1219 02/15/22 1553  ?BP: 105/68 118/86 114/77 109/65  ?Pulse: 83 83 93 82  ?Resp: '15 16 17 17  '$ ?Temp: 98.4 ?F (36.9 ?C) 97.9 ?F (36.6 ?C) 97.9 ?F (36.6 ?C) (!) 97.5 ?F (36.4 ?C)  ?TempSrc: Oral Oral Oral Oral  ?SpO2: 98% 100% 98% 95%  ?Weight:      ?Height:      ? ? ?Intake/Output Summary (Last 24 hours) at 02/15/2022 1726 ?Last data filed at 02/15/2022 1100 ?Gross per 24 hour  ?Intake --  ?Output 28 ml  ?Net -28 ml  ? ? ?Filed Weights  ? 02/10/22 1903  ?Weight: 67.5 kg  ? ? ? ?Exam: ? ?General: Comfortable appearing thin man lying in bed sleeping, arousable by voice alone.  Eyes: sclera anicteric, conjuctiva mild injection bilaterally ?CVS: S1-S2, regular  ?Respiratory:  decreased air entry bilaterally secondary to decreased inspiratory effort, rales at bases  ?GI: NABS,  soft, NT  ?LE: No edema.  ?Neuro: A/O x 3, Moving all extremities equally with normal strength, CN 3-12 intact, grossly nonfocal.  ?Psych: patient is logical and coherent, judgement and insight appear normal, mood and affect appropriate to situation. ? ? ?Assessment & Plan: ?  ?Persistent leukocytosis ?Unclear etiology, patient is afebrile ?Noncon CT abdomen pelvis done yesterday shows thickened bowel loops consistent with small bowel enteritis and colitis however patient without diarrhea or abdominal pain.   ?Discussed with Dr.Singh, who recommended ordering C. Diff GIP  ?Abdominal exam is benign ?Leukocytosis may be secondary to prostate cancer however this was a new leukocytosis in the past week so I doubt this is secondary to the prostate cancer. ? ?Klebsiella CAUTI ?Continue ceftriaxone day #4 ?Patient can be discharged home on cefadroxil given sensitivities on Klebsiella per ID ?Foley catheter discontinued for 02/14/22 ? ?Anemia with hypotension with concern for GI bleed ?H&H have stabilized at 9 ?Blood pressure is also maintaining normal saline at 50 ?Will discontinue normal saline in anticipation of discharge ?Continue octreotide x48 hours then DC ?Continue PPI IV, patient can be discharged on p.o. PPI if warranted ? ?BPH ?PVR was 0 by bladder scan after discontinuation of Foley ?continue tamsulosin ? ?AKI on CKD ?Creatinine, continues to improve modestly but continuously ? ?DM 2 ?Blood sugars normal on diet control ?Last hemoglobin A1c was 6.1 on Actos which is being held ? ?Pancreatic CA ?Metastatic to liver ?Diagnosed 2021, presently  on chemotherapy by Dr. Learta Codding ?On 5-FU, leucovorin and irinotecan ? ? ?DVT prophylaxis: SCD ?Code Status: Full ?Family Communication: Patient's wife was at bedside throughout ?Disposition Plan:  ? Patient is from: Home ? Anticipated Discharge Location: TBD ? Barriers to Discharge: Hypotension ? Is patient medically stable for Discharge: No ? ? ?Scheduled Meds: ? sodium  chloride   Intravenous Once  ? Chlorhexidine Gluconate Cloth  6 each Topical Daily  ? feeding supplement  237 mL Oral BID BM  ? latanoprost  1 drop Both Eyes QHS  ? lipase/protease/amylase  72,000 Units Oral TID WC  ? loratadine  10 mg Oral Daily  ? magnesium oxide  400 mg Oral BID  ? melatonin  3 mg Oral QHS  ? multivitamin with minerals  1 tablet Oral Daily  ? pantoprazole  40 mg Oral BID  ? potassium chloride  40 mEq Oral Daily  ? sodium chloride flush  10-40 mL Intracatheter Q12H  ? tamsulosin  0.4 mg Oral Daily  ? ?Continuous Infusions: ? cefTRIAXone (ROCEPHIN)  IV 1 g (02/15/22 1559)  ? ? ?Data Reviewed: ? ?Basic Metabolic Panel: ?Recent Labs  ?Lab 02/09/22 ?1123 02/09/22 ?1123 02/10/22 ?0425 02/11/22 ?0256 02/12/22 ?0128 02/13/22 ?0115 02/14/22 ?0400 02/15/22 ?0310  ?NA 141  --  142 141 136 137 142 139  ?K 3.2*  --  2.8* 3.3* 4.0 3.7 3.2* 3.7  ?CL 121*  --  120* 112* 106 104 108 115*  ?CO2 9*  --  12* 17* 21* 24 21* 18*  ?GLUCOSE 120*  --  121* 142* 152* 135* 115* 147*  ?BUN 42*  --  38* 30* 32* 32* 28* 26*  ?CREATININE 4.43*  --  4.04* 4.00* 3.60* 3.51* 3.20* 3.15*  ?CALCIUM 8.5*  --  8.1* 8.4* 7.6* 7.5* 7.8* 7.6*  ?MG 1.3*  --  1.5* 1.9  --   --   --   --   ?PHOS  --    < > 2.4* 1.5* 1.8* 2.3* 3.9 3.1  ? < > = values in this interval not displayed.  ? ? ? ?CBC: ?Recent Labs  ?Lab 02/09/22 ?1123 02/10/22 ?0425 02/12/22 ?1200 02/13/22 ?0115 02/13/22 ?0955 02/13/22 ?2200 02/14/22 ?0400 02/14/22 ?1204 02/15/22 ?0310  ?WBC 17.0* 8.9 16.5* 16.8*  --   --   --   --  19.9*  ?NEUTROABS 15.0*  --  14.5* 15.0*  --   --   --   --   --   ?HGB 8.7* 7.5* 7.0* 6.4* 7.0* 9.0* 9.2* 9.9* 9.8*  ?HCT 27.1* 23.6* 20.9* 19.5* 20.5* 26.6* 27.8* 30.2* 30.9*  ?MCV 98.9 97.5 92.9 94.7  --   --   --   --  98.1  ?PLT 237 188 207 174  --   --   --   --  253  ? ? ? ?Studies: ?CT ABDOMEN PELVIS WO CONTRAST ? ?Result Date: 02/13/2022 ?CLINICAL DATA:  UGI bleed, nonvariceal, postsurgical or traumatic, endoscopy contraindicated Retroperitoneal  hematoma, known or suspected (Ped 0-17y) h/o varices and pancreatic ca but no hematemesis or melena EXAM: CT ABDOMEN AND PELVIS WITHOUT CONTRAST TECHNIQUE: Multidetector CT imaging of the abdomen and pelvis was performed following the standard protocol without IV contrast. RADIATION DOSE REDUCTION: This exam was performed according to the departmental dose-optimization program which includes automated exposure control, adjustment of the mA and/or kV according to patient size and/or use of iterative reconstruction technique. COMPARISON:  02/06/2022 FINDINGS: Lower chest: Trace left pleural effusion.  Bibasilar scarring.  Hepatobiliary: Numerous hepatic metastases are again noted, unchanged since recent study. Gallbladder is mildly distended, stable. No visible stones or biliary ductal dilatation. Pancreas: Limited evaluation without intravenous contrast and due to surrounding mesenteric edema. Indistinct, mildly prominent pancreatic head region with atrophy of the body and tail. No change since recent study. Spleen: Splenomegaly.  Low-density lesion anteriorly is unchanged. Adrenals/Urinary Tract: No adrenal abnormality. No focal renal abnormality. No stones or hydronephrosis. Foley catheter in the bladder which is decompressed. Stomach/Bowel: Evaluation limited due to lack of oral and IV contrast and mesenteric edema and ascites. There is apparent wall thickening seen throughout much of the colon, most pronounced in the right colon and proximal transverse colon. There also appears to be wall thickening within mildly prominent left abdominal small bowel loops. Findings concerning for colitis and enteritis. Vascular/Lymphatic: Aortic atherosclerosis. No evidence of aneurysm or adenopathy. Reproductive: Mildly prominent prostate. Other: Moderate ascites, increasing since prior study. Edema throughout the mesentery also increasing since prior study. Musculoskeletal: No acute bony abnormality. Right inguinal hernia  containing fat and ascites. IMPRESSION: Indistinct, prominent appearance of the pancreatic head is similar to prior study with pancreatic body and tail atrophy, unchanged. Hepatic metastases are stable since recent

## 2022-02-16 ENCOUNTER — Other Ambulatory Visit: Payer: Medicare Other

## 2022-02-16 ENCOUNTER — Ambulatory Visit: Payer: Medicare Other | Admitting: Nurse Practitioner

## 2022-02-16 DIAGNOSIS — N179 Acute kidney failure, unspecified: Secondary | ICD-10-CM | POA: Diagnosis not present

## 2022-02-16 DIAGNOSIS — N189 Chronic kidney disease, unspecified: Secondary | ICD-10-CM | POA: Diagnosis not present

## 2022-02-16 LAB — CBC WITH DIFFERENTIAL/PLATELET
Abs Immature Granulocytes: 0.16 10*3/uL — ABNORMAL HIGH (ref 0.00–0.07)
Basophils Absolute: 0.1 10*3/uL (ref 0.0–0.1)
Basophils Relative: 1 %
Eosinophils Absolute: 0.2 10*3/uL (ref 0.0–0.5)
Eosinophils Relative: 1 %
HCT: 30.5 % — ABNORMAL LOW (ref 39.0–52.0)
Hemoglobin: 9.4 g/dL — ABNORMAL LOW (ref 13.0–17.0)
Immature Granulocytes: 1 %
Lymphocytes Relative: 4 %
Lymphs Abs: 0.7 10*3/uL (ref 0.7–4.0)
MCH: 30.8 pg (ref 26.0–34.0)
MCHC: 30.8 g/dL (ref 30.0–36.0)
MCV: 100 fL (ref 80.0–100.0)
Monocytes Absolute: 0.8 10*3/uL (ref 0.1–1.0)
Monocytes Relative: 5 %
Neutro Abs: 13.7 10*3/uL — ABNORMAL HIGH (ref 1.7–7.7)
Neutrophils Relative %: 88 %
Platelets: 237 10*3/uL (ref 150–400)
RBC: 3.05 MIL/uL — ABNORMAL LOW (ref 4.22–5.81)
RDW: 18 % — ABNORMAL HIGH (ref 11.5–15.5)
WBC: 15.6 10*3/uL — ABNORMAL HIGH (ref 4.0–10.5)
nRBC: 0 % (ref 0.0–0.2)

## 2022-02-16 LAB — RENAL FUNCTION PANEL
Albumin: 2.2 g/dL — ABNORMAL LOW (ref 3.5–5.0)
Anion gap: 7 (ref 5–15)
BUN: 22 mg/dL (ref 8–23)
CO2: 17 mmol/L — ABNORMAL LOW (ref 22–32)
Calcium: 7.9 mg/dL — ABNORMAL LOW (ref 8.9–10.3)
Chloride: 117 mmol/L — ABNORMAL HIGH (ref 98–111)
Creatinine, Ser: 3.08 mg/dL — ABNORMAL HIGH (ref 0.61–1.24)
GFR, Estimated: 21 mL/min — ABNORMAL LOW (ref >60)
Glucose, Bld: 131 mg/dL — ABNORMAL HIGH (ref 70–99)
Phosphorus: 2.6 mg/dL (ref 2.5–4.6)
Potassium: 3.7 mmol/L (ref 3.5–5.1)
Sodium: 141 mmol/L (ref 135–145)

## 2022-02-16 MED ORDER — TAMSULOSIN HCL 0.4 MG PO CAPS: 0.4000 mg | ORAL_CAPSULE | Freq: Every day | ORAL | 0 refills | Status: AC

## 2022-02-16 MED ORDER — HEPARIN SOD (PORK) LOCK FLUSH 100 UNIT/ML IV SOLN
500.0000 [IU] | INTRAVENOUS | Status: AC | PRN
  Administered 2022-02-16: 500 [IU]
  Filled 2022-02-16: qty 5

## 2022-02-16 MED ORDER — CEFADROXIL 500 MG PO CAPS: 1000.0000 mg | ORAL_CAPSULE | Freq: Every day | ORAL | 0 refills | Status: AC

## 2022-02-16 MED ORDER — CEFADROXIL 500 MG PO CAPS: 1000.0000 mg | ORAL_CAPSULE | Freq: Two times a day (BID) | ORAL | 0 refills | Status: DC

## 2022-02-16 MED ORDER — MIRABEGRON ER 25 MG PO TB24
25.0000 mg | ORAL_TABLET | Freq: Every day | ORAL | 0 refills | Status: AC
Start: 2022-02-16 — End: 2023-04-09

## 2022-02-16 NOTE — Progress Notes (Signed)
Awaiting for IV team to de-access pt's port-a-cath and for pt 3 in 1 and rollater to be delivered. Then pt can be discharged.  ?

## 2022-02-16 NOTE — Progress Notes (Signed)
Patient given discharge instructions and stated understanding. 

## 2022-02-16 NOTE — Evaluation (Signed)
Physical Therapy Evaluation ?Patient Details ?Name: Aaron Johnston ?MRN: [id number] ?DOB: September 05, 1946 ?Today's Date: 02/16/2022 ? ?History of Present Illness ? 76 yo male presents to ED on 3/28 with acute on chronic kidney disease, persistent leukocytosis, progressive anemia receiving x1 unit RBC, UTI. PMH includes pancreatic CA with metastases to the liver on chemotherapy, portal hypertension with esophageal varices s/p banding, DMII.  ?Clinical Impression ? Pt presents with decreased functional mobility, endurance, strength, and high level balance secondary to diagnosis above. Bed mobility, STS, gait, stair training, RW training, and education on continued mobility were performed. The patient tolerated well and accepted education. Pt ambulated 200 feet with no AD supervision level, as well as performing 10 steps min A. Both him and his wife are happy with his current functional level and feel like he has enough support at home. Pt. Responded well and has only had a mild decrease in functional ability. SPT recommends Home health follow up for further functional mobility, strength, and balance training once medically stable for d/c. Expected quick return to baseline with graded exposure to activity, which pt understands. PT to sign off given supervision level for mobility. Please read consult if there is any change to functional mobility.    ?   ?   ? ?Recommendations for follow up therapy are one component of a multi-disciplinary discharge planning process, led by the attending physician.  Recommendations may be updated based on patient status, additional functional criteria and insurance authorization. ? ?Follow Up Recommendations Home health PT ? ?  ?Assistance Recommended at Discharge Set up Supervision/Assistance  ?Patient can return home with the following ? Help with stairs or ramp for entrance;Assist for transportation;A little help with walking and/or transfers;A little help with bathing/dressing/bathroom ? ?   ?Equipment Recommendations Rolling walker (2 wheels);Other (comment) (per pt. and wife request, HH to educated on shower grab bars)  ?Recommendations for Other Services ?    ?  ?Functional Status Assessment Patient has had a recent decline in their functional status and demonstrates the ability to make significant improvements in function in a reasonable and predictable amount of time.  ? ?  ?Precautions / Restrictions Precautions ?Precautions: Fall ?Restrictions ?Weight Bearing Restrictions: No  ? ?  ? ?Mobility ? Bed Mobility ?Overal bed mobility: Needs Assistance ?Bed Mobility: Supine to Sit ?  ?  ?Supine to sit: Supervision ?  ?  ?General bed mobility comments: Supervision for saftey and line management ?  ? ?Transfers ?Overall transfer level: Needs assistance ?Equipment used: None ?Transfers: Sit to/from Stand ?Sit to Stand: Supervision ?  ?  ?  ?  ?  ?General transfer comment: Supervision for saftey, STSx3, self toileted ?  ? ?Ambulation/Gait ?Ambulation/Gait assistance: Min guard ?Gait Distance (Feet): 200 Feet ?Assistive device: None ?Gait Pattern/deviations: Step-through pattern, Drifts right/left, Trunk flexed, Knee flexed in stance - left, Knee flexed in stance - right ?Gait velocity: decreased ?  ?Pre-gait activities: RW training in room, educated on use at night and when balance feels off ?General Gait Details: Pt. presents with decreased gait speed and drift during ambulation. He drifts left/right throughout, wife states this is close to baseline. ? ?Stairs ?Stairs: Yes ?Stairs assistance: Min assist ?Stair Management: Two rails ?Number of Stairs: 10 ?General stair comments: Pt. has one episode of LOB on first step, states, "I went too fast", cued to slow down and take one step at a time. Able to comlete full flight with standing rest break at the top. ? ?Wheelchair Mobility ?  ? ?  Modified Rankin (Stroke Patients Only) ?  ? ?  ? ?Balance Overall balance assessment: Needs assistance ?Sitting-balance  support: Feet supported ?Sitting balance-Leahy Scale: Fair ?Sitting balance - Comments: Able to sit EOB and maintain balance w/o support ?  ?Standing balance support: No upper extremity supported, During functional activity ?Standing balance-Leahy Scale: Fair ?  ?  ?  ?  ?  ?  ?  ?  ?  ?  ?  ?  ?   ? ? ? ?Pertinent Vitals/Pain Pain Assessment ?Pain Assessment: No/denies pain  ? ? ?Home Living Family/patient expects to be discharged to:: Private residence ?Living Arrangements: Spouse/significant other ?Available Help at Discharge: Family;Available 24 hours/day ?Type of Home: House ?Home Access: Stairs to enter ?Entrance Stairs-Rails: Right;Left;Can reach both ?Entrance Stairs-Number of Steps: 4 ?  ?Home Layout: Two level;Able to live on main level with bedroom/bathroom ?Home Equipment: Wheelchair - manual ?   ?  ?Prior Function   ?  ?  ?  ?  ?  ?  ?Mobility Comments: Independent ?ADLs Comments: Independent ?  ? ? ?Hand Dominance  ? Dominant Hand: Left ? ?  ?Extremity/Trunk Assessment  ? Upper Extremity Assessment ?Upper Extremity Assessment: Defer to OT evaluation ?  ? ?Lower Extremity Assessment ?Lower Extremity Assessment: Generalized weakness ?  ? ?Cervical / Trunk Assessment ?Cervical / Trunk Assessment: Kyphotic  ?Communication  ? Communication: No difficulties  ?Cognition Arousal/Alertness: Awake/alert ?Behavior During Therapy: [facility] for tasks assessed/performed, Flat affect ?Overall Cognitive Status: Within Functional Limits for tasks assessed ?  ?  ?  ?  ?  ?  ?  ?  ?  ?  ?  ?  ?  ?  ?  ?  ?  ?  ?  ? ?  ?General Comments General comments (skin integrity, edema, etc.): Educated on use of RW at night and when balance feels off. ? ?  ?Exercises    ? ?Assessment/Plan  ?  ?PT Assessment All further PT needs can be met in the next venue of care  ?PT Problem List Decreased strength;Decreased mobility;Decreased range of motion;Decreased coordination;Decreased activity tolerance;Decreased balance;Decreased knowledge of  use of DME;Decreased safety awareness ? ?   ?  ?PT Treatment Interventions     ? ?PT Goals (Current goals can be found in the Care Plan section)  ?Acute Rehab PT Goals ?Patient Stated Goal: Maintain stregnth and return home. ?PT Goal Formulation: With patient/family ?Time For Goal Achievement: 02/16/22 ?Potential to Achieve Goals: Good ? ?  ?Frequency   ?  ? ? ?Co-evaluation   ?  ?  ?  ?  ? ? ?  ?AM-PAC PT "6 Clicks" Mobility  ?Outcome Measure Help needed turning from your back to your side while in a flat bed without using bedrails?: A Little ?Help needed moving from lying on your back to sitting on the side of a flat bed without using bedrails?: A Little ?Help needed moving to and from a bed to a chair (including a wheelchair)?: A Little ?Help needed standing up from a chair using your arms (e.g., wheelchair or bedside chair)?: A Little ?Help needed to walk in hospital room?: A Little ?Help needed climbing 3-5 steps with a railing? : A Little ?6 Click Score: 18 ? ?  ?End of Session Equipment Utilized During Treatment: Gait belt ?Activity Tolerance: Patient tolerated treatment well ?Patient left: in bed;with nursing/sitter in room;with call bell/phone within reach ?Nurse Communication: Mobility status ?PT Visit Diagnosis: Unsteadiness on feet (R26.81);Other abnormalities of gait and  mobility (R26.89);Muscle weakness (generalized) (M62.81) ?  ? ?Time: 4158-3094 ?PT Time Calculation (min) (ACUTE ONLY): 37 min ? ? ?Charges:   PT Evaluation ?$PT Eval Low Complexity: 1 Low ?PT Treatments ?$Gait Training: 8-22 mins ?  ?   ? ? ?Thermon Leyland, SPT ?Acute Rehab Services ? ? ?Thermon Leyland ?02/16/2022, 4:10 PM ? ?

## 2022-02-16 NOTE — TOC Transition Note (Signed)
Transition of Care (TOC) - CM/SW Discharge Note ? ? ?Patient Details  ?Name: Aaron Johnston ?MRN: [id number] ?Date of Birth: 04/20/1946 ? ?Transition of Care (TOC) CM/SW Contact:  ?Carles Collet, RN ?Phone Number: ?02/16/2022, 4:06 PM ? ? ?Clinical Narrative:   spoke to patient's wife over the phone. They will need 3/1 and RW for home use. Referral through [employer] and it will be brought to the room prior to DC, nurse messaged and made aware. ?[place] referral made to [place] per wife request due to previous use. ?No other TOC needs identified ? ? ? ? ?Final next level of care: [place] ?Barriers to Discharge: No Barriers Identified ? ? ?Patient Goals and CMS Choice ?Patient states their goals for this hospitalization and ongoing recovery are:: to go home ?CMS Medicare.gov Compare Post Acute Care list provided to:: Other (Comment Required) ?Choice offered to / list presented to : Spouse ? ?Discharge Placement ?  ?           ?  ?  ?  ?  ? ?Discharge Plan and Services ?  ?  ?           ?DME Arranged: 3-N-1, Walker rolling ?DME Agency: [employer] ?Date DME Agency Contacted: 02/16/22 ?Time DME Agency Contacted: [phone] ?Representative spoke with at DME Agency: Sue Lush ?HH Arranged: PT ?[place] Agency: [place] ([place]) ?Date HH Agency Contacted: 02/16/22 ?Time [place]: 0102 ?Representative spoke with at [place]: Ramond Marrow ? ?Social Determinants of Health (SDOH) Interventions ?  ? ? ?Readmission Risk Interventions ?   ? View : No data to display.  ?  ?  ?  ? ? ? ? ? ?

## 2022-02-16 NOTE — Discharge Summary (Signed)
?                                                                                ? Aaron Johnston FMB:[id number] DOB: 11-29-1945 DOA: 02/09/2022 ? ?PCP: [name], [name], MD ? ?Admit date: 02/09/2022  Discharge date: 02/16/2022 ? ?Admitted From: home   Disposition:  home ? ? ?Recommendations for Outpatient Follow-up:  ? ?Follow up with PCP in 5-7 days  ?Follow up with Dr Learta Codding on Friday as scheduled  ? ? ?Home Health: Home health PT   ?Equipment/Devices: Rollator walker ?Consultations: Nephrology, gastroenterology, medical oncology ?Discharge Condition: Improved ?CODE STATUS: Full ?Diet Recommendation: Carb modified ? ?Diet Order   ? ?       ?  Diet Carb Modified       ?  ?  Diet - low sodium heart healthy       ?  ?  Diet heart healthy/carb modified Room service appropriate? Yes; Fluid consistency: Thin  Diet effective now       ?  ? ?  ?  ? ?  ?  ? ?No chief complaint on file. ?  ? ?Brief history of present illness from the day of admission and additional interim summary   ? ? Aaron Johnston is a 76 y.o. male with medical history significant of hypertension, hyperlipidemia, diabetes mellitus type 2, GI bleed secondary to esophageal varices status post banding, and pancreatic cancer diagnosed in 2021 followed by Dr. Learta Codding  on chemotherapy presents with reports of worsening kidney function after lab work obtained yesterday by his nephrologist Dr. Osborne Casco.  Patient's kidney function previously had been around 1.2 in January of this year, but since that time has slowly trended up from 2->3. He had an renal ultrasound 2/23 which noted mild renal pelvic fullness bilaterally thought to be secondary to distended bladder with enlarged prostate.  His wife is present at bedside and notes that over the last 2 to 3 weeks his appetite has definitely decreased.  Patient complains of things having a bad taste possibly related to to his  chemotherapy.  Associated symptoms include decreased urine output, intermittent nosebleeds, weight loss of approximately 7 pounds (119 -> 112 ),wife reports issues with his memory, and intermittently being confused.  Denies having any fever, cough, or blood in his stool/urine.  He reports still having bowel movements and being able to pass flatus.  He had just received infusions of pegfilgrastim and romiplostim  on 3/16.  His last chemotherapy treatment fluorouracil, leucovorin, and irinotecan was on 3/14.  Lastly patient brought up these been having difficulty sleeping, but patient's wife notes that he sleeps throughout the day. ?  ?Upon admission into the emergency department patient was noted to be afebrile with vital signs relatively stable.  WBC 17, hemoglobin 8.7, potassium 3.2, CO2 9, BUN 42, creatinine 4.43, and magnesium 1.3.  Chest x-ray noted no acute abnormality.   ? ?  Hospital Course  ? ?Patient did well with IV fluid resuscitation with resolution of acute kidney injury and return to his baseline creatinine.  Patient was ready to go home however subsequently developed a fever.  Work-up revealed CAUTI and patient was started on ceftriaxone.  Urine culture subsequently grew out Klebsiella.  While patient was recovering, he was noted to have drop in blood pressure as well as drop in H&H.  Patient was seen by GI Dr. Paulita Fujita who noted that given no obvious melena or hematemesis, there would be no need for endoscopy.  Patient was treated conservatively with blood transfusions and IV fluid resuscitation's to maintain blood pressure.  Abdominal CT revealed no retroperitoneal bleed.  Patient recovered and has maintained his blood pressure and H&H over the past 3 days.  Patient subsequently complained of suprapubic pain.  Foley catheter was discontinued with improvement in symptoms.  Patient's course has been complicated by significant leukocytosis  peaking at 19, after Foley was discontinued, WBC decreased to 15.  Patient was discharged home on cefadroxil after discussion with ID and review of Klebsiella sensitivities. ? ?Leukocytosis ?Improved after discontinuation of Foley, likely secondary to CAUTI ?Repeat WBC in 3 days at medical oncology appointment ?Noncon CT abdomen pelvis done yesterday shows thickened bowel loops consistent with small bowel enteritis and colitis--patient states he has had diarrhea ever since he started chemotherapy.Abdominal exam is benign. ?C. Diff GIP was negative ? ?Klebsiella CAUTI ?Patient completed 5-day course of ceftriaxone ?Patient can be discharged home on cefadroxil given sensitivities on Klebsiella for 10-day course, renally dosed every 24. ?Foley catheter discontinued for 02/14/22 ?  ?Anemia with hypotension with concern for GI bleed ?Patient was treated with octreotide and PPI ?H&H and blood pressure have normalized and have maintained WNL ? ?BPH ?PVR was 0 by bladder scan after discontinuation of Foley ?Continue tamsulosin, trial of Myrbetriq for urge incontinence ?  ?AKI on CKD ?Creatinine, continues to improve modestly but continuously ?  ?DM 2 ?Blood sugars normal on diet control ?Last hemoglobin A1c was 6.1 on Actos which is being held ?  ?Pancreatic CA ?Metastatic to liver ?Diagnosed 2021, presently on chemotherapy by Dr. Learta Codding ?On 5-FU, leucovorin and irinotecan ?  ? ? ?Discharge diagnosis   ? ? ?Principal Problem: ?  Acute kidney injury superimposed on chronic kidney disease ([place]) secondary to BPH with urinary obstruction ?Active Problems: ?  BPH with urinary obstruction ?  Primary cancer of head of pancreas ([place]) ?  Leukocytosis ?  Normocytic anemia ?  Hypomagnesemia ?  Hypokalemia ?  GERD (gastroesophageal reflux disease) ?  Controlled type 2 diabetes mellitus without complication, without long-term current use of insulin ([place]) ?  Insomnia ? ? ? ?Discharge instructions   ? ?Discharge Instructions   ? ? Call MD  for:  persistant nausea and vomiting   Complete by: As directed ?  ? Diet - low sodium heart healthy   Complete by: As directed ?  ? Diet Carb Modified   Complete by: As directed ?  ? Discharge instructions   Complete by: As directed ?  ? 1.  You need to stay on the antibiotic called cefadroxil for 10 days to make sure that your urinary tract infection gets completely cured. ?2.  I have started tamsulosin (Flomax) and mirabegron (Myrbetriq) to help you with your bladder and your need to urinate suddenly. ?3.  Make sure you see your PCP in 5 to 7 days to make sure you are continuing to do well and also  to check your WBC count which should be improving as well.  Dr. Malachy Mood will probably check your WBC count on Friday in any case.  I hope it would be less than 15 on recheck.  ? Increase activity slowly   Complete by: As directed ?  ? Increase activity slowly   Complete by: As directed ?  ? ?  ? ? ?Discharge Medications  ? ?Allergies as of 02/16/2022   ?No Known Allergies ?  ? ?  ?Medication List  ?  ? ?STOP taking these medications   ? ?propranolol 10 MG tablet ?Commonly known as: INDERAL ?  ?tadalafil 20 MG tablet ?Commonly known as: CIALIS ?  ? ?  ? ?TAKE these medications   ? ?Accu-Chek FastClix Lancets Misc ?Apply topically. ?  ?Accu-Chek Guide test strip ?Generic drug: glucose blood ?  ?Accu-Chek Guide w/Device Kit ?See admin instructions. ?  ?cefadroxil 500 MG capsule ?Commonly known as: DURICEF ?Take 2 capsules (1,000 mg total) by mouth daily for 10 days. ?  ?diphenoxylate-atropine 2.5-0.025 MG tablet ?Commonly known as: LOMOTIL ?Take 1-2 tablets by mouth 4 (four) times daily as needed for diarrhea or loose stools. ?What changed: how much to take ?  ?HYDROcodone-acetaminophen 5-325 MG tablet ?Commonly known as: NORCO/VICODIN ?Take 1-2 tablets by mouth every 4 (four) hours as needed. ?What changed: how much to take ?  ?latanoprost 0.005 % ophthalmic solution ?Commonly known as: XALATAN ?Place 1 drop into both eyes  at bedtime. ?  ?lidocaine-prilocaine cream ?Commonly known as: EMLA ?Apply 1 application topically as directed. Apply to port site 1 hour prior to stick and cover with plastic wrap ?What changed:  ?when to take t

## 2022-02-16 NOTE — Plan of Care (Signed)
?  Problem: Education: ?Goal: Knowledge of General Education information will improve ?Description: Including pain rating scale, medication(s)/side effects and non-pharmacologic comfort measures ?Outcome: Adequate for Discharge ?  ?Problem: Health Behavior/Discharge Planning: ?Goal: Ability to manage health-related needs will improve ?Outcome: Adequate for Discharge ?  ?Problem: Clinical Measurements: ?Goal: Ability to maintain clinical measurements within normal limits will improve ?Outcome: Adequate for Discharge ?Goal: Will remain free from infection ?Outcome: Adequate for Discharge ?Goal: Diagnostic test results will improve ?Outcome: Adequate for Discharge ?Goal: Respiratory complications will improve ?Outcome: Adequate for Discharge ?Goal: Cardiovascular complication will be avoided ?Outcome: Adequate for Discharge ?  ?Problem: Nutrition: ?Goal: Adequate nutrition will be maintained ?Outcome: Adequate for Discharge ?  ?Problem: Increased Nutrient Needs (NI-5.1) ?Goal: Food and/or nutrient delivery ?Description: Individualized approach for food/nutrient provision. ?Outcome: Adequate for Discharge ?  ?

## 2022-02-17 ENCOUNTER — Encounter: Payer: Self-pay | Admitting: Oncology

## 2022-02-17 ENCOUNTER — Encounter: Payer: Self-pay | Admitting: Nurse Practitioner

## 2022-02-17 LAB — CULTURE, BLOOD (ROUTINE X 2)
Culture: NO GROWTH
Culture: NO GROWTH
Special Requests: ADEQUATE
Special Requests: ADEQUATE

## 2022-02-18 ENCOUNTER — Telehealth: Payer: Self-pay | Admitting: *Deleted

## 2022-02-18 NOTE — Telephone Encounter (Signed)
Aaron Johnston was contacted by telephone to verify understanding of discharge instructions status post their most recent discharge from the hospital on the date:  02/16/22.  Inpatient discharge AVS was re-reviewed with Aaron Johnston, along with cancer center appointments for tomorrow. Verification of understanding for oncology specific follow-up was validated using the Teach Back method. Aaron Johnston is feeling stronger and eating better. No trouble voiding. Has his rolling walker and physical therapy due to come today to home.  ? ?Transportation to appointments were confirmed for the patient as being self/caregiver. ? ?Aaron Johnston?s questions were addressed to their satisfaction upon completion of this post discharge follow-up call for outpatient oncology.  ?

## 2022-02-19 ENCOUNTER — Encounter: Payer: Self-pay | Admitting: Nurse Practitioner

## 2022-02-19 ENCOUNTER — Inpatient Hospital Stay (HOSPITAL_BASED_OUTPATIENT_CLINIC_OR_DEPARTMENT_OTHER): Payer: Medicare Other | Admitting: Nurse Practitioner

## 2022-02-19 ENCOUNTER — Other Ambulatory Visit: Payer: Self-pay | Admitting: Nurse Practitioner

## 2022-02-19 ENCOUNTER — Inpatient Hospital Stay: Payer: Medicare Other

## 2022-02-19 ENCOUNTER — Inpatient Hospital Stay: Payer: Medicare Other | Attending: Genetic Counselor

## 2022-02-19 VITALS — BP 109/72 | HR 92 | Temp 97.9°F | Resp 18 | Wt 137.2 lb

## 2022-02-19 DIAGNOSIS — B961 Klebsiella pneumoniae [K. pneumoniae] as the cause of diseases classified elsewhere: Secondary | ICD-10-CM | POA: Diagnosis not present

## 2022-02-19 DIAGNOSIS — D649 Anemia, unspecified: Secondary | ICD-10-CM | POA: Diagnosis not present

## 2022-02-19 DIAGNOSIS — N289 Disorder of kidney and ureter, unspecified: Secondary | ICD-10-CM | POA: Insufficient documentation

## 2022-02-19 DIAGNOSIS — D696 Thrombocytopenia, unspecified: Secondary | ICD-10-CM

## 2022-02-19 DIAGNOSIS — C250 Malignant neoplasm of head of pancreas: Secondary | ICD-10-CM

## 2022-02-19 DIAGNOSIS — Z5111 Encounter for antineoplastic chemotherapy: Secondary | ICD-10-CM | POA: Diagnosis not present

## 2022-02-19 DIAGNOSIS — G629 Polyneuropathy, unspecified: Secondary | ICD-10-CM | POA: Insufficient documentation

## 2022-02-19 DIAGNOSIS — D709 Neutropenia, unspecified: Secondary | ICD-10-CM | POA: Insufficient documentation

## 2022-02-19 DIAGNOSIS — M549 Dorsalgia, unspecified: Secondary | ICD-10-CM | POA: Diagnosis not present

## 2022-02-19 DIAGNOSIS — E119 Type 2 diabetes mellitus without complications: Secondary | ICD-10-CM | POA: Diagnosis not present

## 2022-02-19 DIAGNOSIS — I10 Essential (primary) hypertension: Secondary | ICD-10-CM | POA: Diagnosis not present

## 2022-02-19 DIAGNOSIS — Z95828 Presence of other vascular implants and grafts: Secondary | ICD-10-CM

## 2022-02-19 DIAGNOSIS — C787 Secondary malignant neoplasm of liver and intrahepatic bile duct: Secondary | ICD-10-CM | POA: Insufficient documentation

## 2022-02-19 LAB — CMP (CANCER CENTER ONLY)
ALT: 20 U/L (ref 0–44)
AST: 19 U/L (ref 15–41)
Albumin: 3.1 g/dL — ABNORMAL LOW (ref 3.5–5.0)
Alkaline Phosphatase: 269 U/L — ABNORMAL HIGH (ref 38–126)
Anion gap: 9 (ref 5–15)
BUN: 23 mg/dL (ref 8–23)
CO2: 13 mmol/L — ABNORMAL LOW (ref 22–32)
Calcium: 8.4 mg/dL — ABNORMAL LOW (ref 8.9–10.3)
Chloride: 120 mmol/L — ABNORMAL HIGH (ref 98–111)
Creatinine: 3.16 mg/dL (ref 0.61–1.24)
GFR, Estimated: 20 mL/min — ABNORMAL LOW (ref >60)
Glucose, Bld: 167 mg/dL — ABNORMAL HIGH (ref 70–99)
Potassium: 4.8 mmol/L (ref 3.5–5.1)
Sodium: 142 mmol/L (ref 135–145)
Total Bilirubin: 0.8 mg/dL (ref 0.3–1.2)
Total Protein: 6.1 g/dL — ABNORMAL LOW (ref 6.5–8.1)

## 2022-02-19 LAB — CBC WITH DIFFERENTIAL (CANCER CENTER ONLY)
Abs Immature Granulocytes: 0.21 10*3/uL — ABNORMAL HIGH (ref 0.00–0.07)
Basophils Absolute: 0.2 10*3/uL — ABNORMAL HIGH (ref 0.0–0.1)
Basophils Relative: 1 %
Eosinophils Absolute: 0.1 10*3/uL (ref 0.0–0.5)
Eosinophils Relative: 1 %
HCT: 33.3 % — ABNORMAL LOW (ref 39.0–52.0)
Hemoglobin: 10.2 g/dL — ABNORMAL LOW (ref 13.0–17.0)
Immature Granulocytes: 1 %
Lymphocytes Relative: 6 %
Lymphs Abs: 1.1 10*3/uL (ref 0.7–4.0)
MCH: 30.4 pg (ref 26.0–34.0)
MCHC: 30.6 g/dL (ref 30.0–36.0)
MCV: 99.1 fL (ref 80.0–100.0)
Monocytes Absolute: 1.3 10*3/uL — ABNORMAL HIGH (ref 0.1–1.0)
Monocytes Relative: 7 %
Neutro Abs: 16 10*3/uL — ABNORMAL HIGH (ref 1.7–7.7)
Neutrophils Relative %: 84 %
Platelet Count: 220 10*3/uL (ref 150–400)
RBC: 3.36 MIL/uL — ABNORMAL LOW (ref 4.22–5.81)
RDW: 18.1 % — ABNORMAL HIGH (ref 11.5–15.5)
WBC Count: 18.9 10*3/uL — ABNORMAL HIGH (ref 4.0–10.5)
nRBC: 0 % (ref 0.0–0.2)

## 2022-02-19 MED ORDER — SODIUM CHLORIDE 0.9% FLUSH
10.0000 mL | INTRAVENOUS | Status: DC | PRN
  Administered 2022-02-19: 10 mL

## 2022-02-19 MED ORDER — HEPARIN SOD (PORK) LOCK FLUSH 100 UNIT/ML IV SOLN
500.0000 [IU] | Freq: Once | INTRAVENOUS | Status: AC | PRN
  Administered 2022-02-19: 500 [IU]

## 2022-02-19 NOTE — Progress Notes (Signed)
CRITICAL VALUE STICKER ? ?CRITICAL VALUE:Creatinine 3.16 ? ?RECEIVER (on-site recipient of call):Clariece Roesler,RN ? ?DATE & TIME NOTIFIED: 02/19/22 @ 0900 ? ?MESSENGER (representative from lab):Simona Huh ? ?MD NOTIFIED: [name], NP ? ?TIME OF NOTIFICATION: 0901 ? ?RESPONSE: Seeing patient now ? ?

## 2022-02-19 NOTE — Patient Instructions (Signed)

## 2022-02-19 NOTE — Progress Notes (Signed)
?[place] ?OFFICE PROGRESS NOTE ? ? ?Diagnosis:  Pancreas cancer ? ?INTERVAL HISTORY:  ? ?Aaron Johnston returns as scheduled.  He was hospitalized 02/09/2022 through 02/16/2022 due to worsening kidney function.  There was mild improvement.  Creatinine at discharge 3.08. ? ?He reports a fall at home after getting up "too fast".  He denies pain.  No change in baseline diarrhea.  No nausea or vomiting.  Fairly good oral intake.  He is participating in physical therapy. ? ?Objective: ? ?Vital signs in last 24 hours: ? ?Blood pressure 109/72, pulse 92, temperature 97.9 ?F (36.6 ?C), temperature source Tympanic, resp. rate 18, weight 137 lb 3.2 oz (62.2 kg), SpO2 98 %. ?  ? ?HEENT: No thrush or ulcers. ?Resp: Lungs clear bilaterally. ?Cardio: Regular rate and rhythm. ?GI: Abdomen is distended.  No hepatomegaly. ?Vascular: Pitting edema below the knee bilaterally. ? ?Port-A-Cath without erythema. ? ?Lab Results: ? ?Lab Results  ?Component Value Date  ? WBC 18.9 (H) 02/19/2022  ? HGB 10.2 (L) 02/19/2022  ? HCT 33.3 (L) 02/19/2022  ? MCV 99.1 02/19/2022  ? PLT 220 02/19/2022  ? NEUTROABS 16.0 (H) 02/19/2022  ? ? ?Imaging: ? ?No results found. ? ?Medications: I have reviewed the patient's current medications. ? ?Assessment/Plan: ?Pancreas cancer ?CT urology center 03/04/2020-haziness of the fat adjacent to the celiac axis and SMA with focal narrowing of the SMV, splenic vein, and splenoportal confluence ?CT 07/07/2020-infiltrative retroperitoneal mass with encasement of the celiac axis and SMA, high-grade narrowing of the proximal portal vein with cavernous transformation, nonocclusive thrombus within the main portal vein, mild biliary ductal dilatation, diffuse hypodense/hypoenhancing areas within the liver (edema versus infiltrating neoplasm), 2 x 1.7 cm area of focal prominence in the pancreas ?EUS 07/10/2020-no pancreas mass identified, 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, 1.5 cm aortocaval node  biopsy-scant lymphoid material, no malignancy ?Diagnostic laparoscopy 07/17/2020-segment 3 and 4 liver nodules, adenocarcinoma, positive for pankeratin, CK7 and CK20, partially positive for TTF-1, negative for CDX2 ?Foundation 1-microsatellite stable, tumor mutation burden 4, K-ras G12D, subclonal RB1 alteration ?Elevated CA 19-9 ?Cycle 1 FOLFOX 07/28/2020 ?Cycle 2 FOLFOX 08/18/2020, oxaliplatin dose reduced due to thrombocytopenia ?Cycle 3 FOLFOX 09/08/2020 ?Cycle 4 FOLFOX 09/29/2020  ?Cycle 5 FOLFOX 10/20/2020 ?CTs 11/06/2020-grossly stable infiltrative mass within the pancreatic head and porta hepatis.  New small low-density liver lesions.  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 ?Cycle 6 FOLFOX 11/10/2021  ?Cycle 7 FOLFOX 12/01/2020 ?MRI liver 12/04/2020-no significant change in posttreatment appearance of the pancreatic head.  Numerous intrinsically low signal hypoenhancing lesions of the liver parenchyma predominantly observed in the right lobe of the liver and liver dome, measuring no greater than 8 mm and as seen on recent prior CT of the abdomen/pelvis. ?Cycle 8 FOLFOX 12/22/2020 ?Cycle 9 FOLFOX 01/12/2021 ?Cycle 10 FOLFOX 02/02/2021 ?MRI liver 02/19/2021-no change in pancreas head mass, multiple hypoenhancing liver lesions are new and increased in size, effacement of portal vein by pancreas head mass with cavernous transformation, no change in mild splenomegaly ?Cycle 1 gemcitabine/Abraxane 03/12/2021 ?Cycle 2 gemcitabine/Abraxane 03/27/2021- dose reductions secondary to neutropenia ?Cycle 3 gemcitabine/Abraxane 04/17/2021-dose reductions due to thrombocytopenia, white cell growth factor support added ?Cycle 4 gemcitabine/Abraxane 05/01/2021, Ziextenzo ?Cycle 5 gemcitabine/Abraxane 05/15/2021, Ziextenzo ?MRI abdomen 05/26/2021-mild enlargement of dominant hepatic metastases, appearance of infiltrative  pancreas mass extending to the celiac trunk and SMA, splenic vein occlusion, cavernous transformation of  the portal vein with upper abdomen collaterals, splenomegaly, mild upper abdominal ascites and mesenteric edema, minimal subpleural nodularity left lower lobe ?Cycle 6 gemcitabine/Abraxane 05/28/2021 ?Cycle 7 gemcitabine/Abraxane 06/12/2021 ?Cycle 8 gemcitabine/Abraxane 06/26/2021 ?Cycle 9 gemcitabine/Abraxane 07/10/2021 ?Cycle 10 gemcitabine/Abraxane 07/23/2021 ?Cycle 11 gemcitabine/Abraxane 08/06/2021 ?MRI abdomen 08/20/2021-multifocal nodularity in the lower chest with a new discrete right lower lobe nodule, enlarging subsegment 7/8 liver lesion, other small lesions appear stable, gastric varices, review of images with radiology-enlargement of at least 2 liver lesions, suspicious nodule n the right lower lung ?Cycle 1  5-FU/liposomal irinotecan 09/08/2021 ?Cycle 2  5-FU/liposomal irinotecan 09/22/2021 ?Cycle 3  5-FU/liposomal irinotecan 10/20/2021 ?Cycle 4 5-FU/liposomal irinotecan 11/04/2021 ?Cycle 5 5-FU/liposomal irinotecan 11/17/2021 ?MRI abdomen 11/27/2021-generally unchanged appearance of the pancreas with a very ill-defined hypoenhancing mass centered within the pancreatic neck, again appears to encase the celiac axis and encasing occlude the superior mesenteric vein and splenic vein; interval enlargement of multiple liver lesions; numerous lung nodules in the bilateral lung bases; unchanged splenomegaly; multiple new heterogeneously and hypoenhancing subcapsular lesions of the spleen most consistent with infarctions; small volume ascites throughout the abdomen; distended, debris-filled stomach suggesting some degree of gastric outlet and/or duodenal obstruction. ?Cycle 6 5-FU/liposomal irinotecan 12/01/2021 ?Cycle 7 5-FU/liposomal irinotecan 12/14/2021 ?Cycle 8 5-FU/liposomal irinotecan 01/12/2022 ?Cycle 9 5-FU/liposomal irinotecan 01/26/2022 ?CTs 02/06/2022-slight increase in size of liver metastases, indeterminate  spleen lesion suspicious for metastasis, scattered reticulonodular densities in the lungs ?  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   Admission with recurrent GI bleeding and severe anemia 09/15/2021 ?Upper endoscopy 09/17/2021-esophageal varices with stigmata of recent bleeding, banded.  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  Influenza A 10/13/2021 ?11.  Renal insufficiency February 2023-renal ultrasound 01/07/2022-increase renal cortical echogenicity bilaterally, mild bilateral renal pelvic fullness; referred to nephrology ?12.  Admission 02/09/2022 for evaluation and management of progressive renal failure ?13.  Klebsiella urinary tract infection 02/12/2022 ?  ? 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  His performance status is slowly declining.  At present he is not a candidate for additional systemic therapy.  We discussed this with him and his wife at today's visit.  We discussed a supportive care approach with a hospice referral.  He is not ready to make a decision on this.  He would like to continue physical therapy for the next few weeks and then reevaluate. ? ?He will return for follow-up in 2  weeks. ? ?Patient seen with Dr. Benay Spice. ? ? ? ?[name] ANP/GNP-BC  ? ?02/19/2022  ?9:14 AM ? ?This was a shared visit with [name].  Aaron Johnston was discharged on 02/16/2022 after admission with progressiv

## 2022-02-20 LAB — CANCER ANTIGEN 19-9: CA 19-9: 7861 U/mL — ABNORMAL HIGH (ref 0–35)

## 2022-02-22 ENCOUNTER — Encounter: Payer: Self-pay | Admitting: Nurse Practitioner

## 2022-02-22 ENCOUNTER — Encounter: Payer: Self-pay | Admitting: Oncology

## 2022-02-26 ENCOUNTER — Other Ambulatory Visit: Payer: Self-pay

## 2022-02-26 DIAGNOSIS — C250 Malignant neoplasm of head of pancreas: Secondary | ICD-10-CM

## 2022-03-05 ENCOUNTER — Other Ambulatory Visit: Payer: Self-pay

## 2022-03-05 ENCOUNTER — Inpatient Hospital Stay: Payer: Medicare Other

## 2022-03-05 ENCOUNTER — Telehealth: Payer: Self-pay

## 2022-03-05 ENCOUNTER — Inpatient Hospital Stay (HOSPITAL_BASED_OUTPATIENT_CLINIC_OR_DEPARTMENT_OTHER): Payer: Medicare Other | Admitting: Oncology

## 2022-03-05 DIAGNOSIS — Z95828 Presence of other vascular implants and grafts: Secondary | ICD-10-CM

## 2022-03-05 DIAGNOSIS — C250 Malignant neoplasm of head of pancreas: Secondary | ICD-10-CM | POA: Diagnosis not present

## 2022-03-05 DIAGNOSIS — Z5111 Encounter for antineoplastic chemotherapy: Secondary | ICD-10-CM | POA: Diagnosis not present

## 2022-03-05 DIAGNOSIS — D696 Thrombocytopenia, unspecified: Secondary | ICD-10-CM

## 2022-03-05 LAB — CMP (CANCER CENTER ONLY)
ALT: 41 U/L (ref 0–44)
AST: 37 U/L (ref 15–41)
Albumin: 3 g/dL — ABNORMAL LOW (ref 3.5–5.0)
Alkaline Phosphatase: 374 U/L — ABNORMAL HIGH (ref 38–126)
Anion gap: 12 (ref 5–15)
BUN: 39 mg/dL — ABNORMAL HIGH (ref 8–23)
CO2: 7 mmol/L — ABNORMAL LOW (ref 22–32)
Calcium: 8.9 mg/dL (ref 8.9–10.3)
Chloride: 121 mmol/L — ABNORMAL HIGH (ref 98–111)
Creatinine: 3.53 mg/dL (ref 0.61–1.24)
GFR, Estimated: 17 mL/min — ABNORMAL LOW (ref >60)
Glucose, Bld: 146 mg/dL — ABNORMAL HIGH (ref 70–99)
Potassium: 4.2 mmol/L (ref 3.5–5.1)
Sodium: 140 mmol/L (ref 135–145)
Total Bilirubin: 0.8 mg/dL (ref 0.3–1.2)
Total Protein: 6.6 g/dL (ref 6.5–8.1)

## 2022-03-05 LAB — CBC WITH DIFFERENTIAL (CANCER CENTER ONLY)
Abs Immature Granulocytes: 0.05 10*3/uL (ref 0.00–0.07)
Basophils Absolute: 0.2 10*3/uL — ABNORMAL HIGH (ref 0.0–0.1)
Basophils Relative: 1 %
Eosinophils Absolute: 0.3 10*3/uL (ref 0.0–0.5)
Eosinophils Relative: 2 %
HCT: 24.2 % — ABNORMAL LOW (ref 39.0–52.0)
Hemoglobin: 7.5 g/dL — ABNORMAL LOW (ref 13.0–17.0)
Immature Granulocytes: 0 %
Lymphocytes Relative: 7 %
Lymphs Abs: 1 10*3/uL (ref 0.7–4.0)
MCH: 30.5 pg (ref 26.0–34.0)
MCHC: 31 g/dL (ref 30.0–36.0)
MCV: 98.4 fL (ref 80.0–100.0)
Monocytes Absolute: 0.8 10*3/uL (ref 0.1–1.0)
Monocytes Relative: 6 %
Neutro Abs: 12.2 10*3/uL — ABNORMAL HIGH (ref 1.7–7.7)
Neutrophils Relative %: 84 %
Platelet Count: 100 10*3/uL — ABNORMAL LOW (ref 150–400)
RBC: 2.46 MIL/uL — ABNORMAL LOW (ref 4.22–5.81)
RDW: 18.6 % — ABNORMAL HIGH (ref 11.5–15.5)
WBC Count: 14.5 10*3/uL — ABNORMAL HIGH (ref 4.0–10.5)
nRBC: 0 % (ref 0.0–0.2)

## 2022-03-05 LAB — PREPARE RBC (CROSSMATCH)

## 2022-03-05 LAB — SAMPLE TO BLOOD BANK

## 2022-03-05 MED ORDER — HEPARIN SOD (PORK) LOCK FLUSH 100 UNIT/ML IV SOLN
500.0000 [IU] | Freq: Once | INTRAVENOUS | Status: AC | PRN
  Administered 2022-03-05: 500 [IU]

## 2022-03-05 MED ORDER — SODIUM CHLORIDE 0.9% FLUSH
10.0000 mL | INTRAVENOUS | Status: DC | PRN
  Administered 2022-03-05: 10 mL

## 2022-03-05 NOTE — Progress Notes (Signed)
CRITICAL VALUE STICKER ? ?CRITICAL VALUE:Creatinine 3.53 ? ?RECEIVER (on-site recipient of call):Kam Kushnir,RN ? ?DATE & TIME NOTIFIED: 03/05/22 @ 1053 ? ?MESSENGER (representative from lab):Simona Huh ? ?MD NOTIFIED: Dr. Benay Spice ? ?TIME OF NOTIFICATION: 1055 ? ?RESPONSE:  Being followed by renal. Forwarded results ?

## 2022-03-05 NOTE — Telephone Encounter (Signed)
RBCS order is in, faxed over to [facility] and dash will pick at 730 on 03/08/22 ?  ?

## 2022-03-05 NOTE — Progress Notes (Signed)
?[place] ?OFFICE PROGRESS NOTE ? ? ?Diagnosis: Pancreas cancer ? ?INTERVAL HISTORY:  ? ?[name] returns as scheduled.  He is here with his wife.  Leg edema has improved with elevation.  His activity level has declined.  He reports dyspnea.  He stays in bed or on the couch for over half of the day.  No pain.  He had "black "stool a few days ago. ?He has persistent leukocytosis.  He was placed on Bactrim by Dr. [name] 03/02/2022. ?Objective: ? ?Vital signs in last 24 hours: ? ?Blood pressure 136/83, pulse 100, temperature 98.1 ?F (36.7 ?C), temperature source Oral, resp. rate 18, height '5\' 9"'  (1.753 m), weight 131 lb 3.2 oz (59.5 kg), SpO2 100 %. ?  ? ?Resp: Lungs clear bilaterally ?Cardio: Regular rate and rhythm ?GI: Nontender, mildly distended, no mass, no hepatomegaly ?Vascular: Trace pitting edema at the lower leg bilaterally ?  ? ?Portacath/PICC-without erythema ? ?Lab Results: ? ?Lab Results  ?Component Value Date  ? WBC 14.5 (H) 03/05/2022  ? HGB 7.5 (L) 03/05/2022  ? HCT 24.2 (L) 03/05/2022  ? MCV 98.4 03/05/2022  ? PLT 100 (L) 03/05/2022  ? NEUTROABS 12.2 (H) 03/05/2022  ? ? ?CMP  ?Lab Results  ?Component Value Date  ? NA 142 02/19/2022  ? K 4.8 02/19/2022  ? CL 120 (H) 02/19/2022  ? CO2 13 (L) 02/19/2022  ? GLUCOSE 167 (H) 02/19/2022  ? BUN 23 02/19/2022  ? CREATININE 3.16 (HH) 02/19/2022  ? CALCIUM 8.4 (L) 02/19/2022  ? PROT 6.1 (L) 02/19/2022  ? ALBUMIN 3.1 (L) 02/19/2022  ? AST 19 02/19/2022  ? ALT 20 02/19/2022  ? ALKPHOS 269 (H) 02/19/2022  ? BILITOT 0.8 02/19/2022  ? GFRNONAA 20 (L) 02/19/2022  ? GFRAA NOT CALCULATED 02/23/2021  ? ? ?Lab Results  ?Component Value Date  ? IPJ825 0,539 (H) 02/19/2022  ? ? ? ?Medications: I have reviewed the patient's current medications. ? ? ?Assessment/Plan: ?Pancreas cancer ?CT urology center 03/04/2020-haziness of the fat adjacent to the celiac axis and SMA with focal narrowing of the SMV, splenic vein, and splenoportal confluence ?CT  07/07/2020-infiltrative retroperitoneal mass with encasement of the celiac axis and SMA, high-grade narrowing of the proximal portal vein with cavernous transformation, nonocclusive thrombus within the main portal vein, mild biliary ductal dilatation, diffuse hypodense/hypoenhancing areas within the liver (edema versus infiltrating neoplasm), 2 x 1.7 cm area of focal prominence in the pancreas ?EUS 07/10/2020-no pancreas mass identified, 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, 1.5 cm aortocaval node biopsy-scant lymphoid material, no malignancy ?Diagnostic laparoscopy 07/17/2020-segment 3 and 4 liver nodules, adenocarcinoma, positive for pankeratin, CK7 and CK20, partially positive for TTF-1, negative for CDX2 ?Foundation 1-microsatellite stable, tumor mutation burden 4, K-ras G12D, subclonal RB1 alteration ?Elevated CA 19-9 ?Cycle 1 FOLFOX 07/28/2020 ?Cycle 2 FOLFOX 08/18/2020, oxaliplatin dose reduced due to thrombocytopenia ?Cycle 3 FOLFOX 09/08/2020 ?Cycle 4 FOLFOX 09/29/2020  ?Cycle 5 FOLFOX 10/20/2020 ?CTs 11/06/2020-grossly stable infiltrative mass within the pancreatic head and porta hepatis.  New small low-density liver lesions.  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 ?Cycle 6 FOLFOX 11/10/2021  ?Cycle 7 FOLFOX 12/01/2020 ?MRI liver 12/04/2020-no significant change in posttreatment appearance of the pancreatic head.  Numerous intrinsically low signal hypoenhancing lesions of the liver parenchyma predominantly observed in the right lobe of the liver and liver dome, measuring no greater than 8 mm and  as seen on recent prior CT of the abdomen/pelvis. ?Cycle 8 FOLFOX 12/22/2020 ?Cycle 9 FOLFOX 01/12/2021 ?Cycle 10 FOLFOX 02/02/2021 ?MRI liver 02/19/2021-no change in pancreas head mass, multiple hypoenhancing liver lesions are new and increased in size, effacement of portal vein by pancreas  head mass with cavernous transformation, no change in mild splenomegaly ?Cycle 1 gemcitabine/Abraxane 03/12/2021 ?Cycle 2 gemcitabine/Abraxane 03/27/2021- dose reductions secondary to neutropenia ?Cycle 3 gemcitabine/Abraxane 04/17/2021-dose reductions due to thrombocytopenia, white cell growth factor support added ?Cycle 4 gemcitabine/Abraxane 05/01/2021, Ziextenzo ?Cycle 5 gemcitabine/Abraxane 05/15/2021, Ziextenzo ?MRI abdomen 05/26/2021-mild enlargement of dominant hepatic metastases, appearance of infiltrative pancreas mass extending to the celiac trunk and SMA, splenic vein occlusion, cavernous transformation of the portal vein with upper abdomen collaterals, splenomegaly, mild upper abdominal ascites and mesenteric edema, minimal subpleural nodularity left lower lobe ?Cycle 6 gemcitabine/Abraxane 05/28/2021 ?Cycle 7 gemcitabine/Abraxane 06/12/2021 ?Cycle 8 gemcitabine/Abraxane 06/26/2021 ?Cycle 9 gemcitabine/Abraxane 07/10/2021 ?Cycle 10 gemcitabine/Abraxane 07/23/2021 ?Cycle 11 gemcitabine/Abraxane 08/06/2021 ?MRI abdomen 08/20/2021-multifocal nodularity in the lower chest with a new discrete right lower lobe nodule, enlarging subsegment 7/8 liver lesion, other small lesions appear stable, gastric varices, review of images with radiology-enlargement of at least 2 liver lesions, suspicious nodule n the right lower lung ?Cycle 1  5-FU/liposomal irinotecan 09/08/2021 ?Cycle 2  5-FU/liposomal irinotecan 09/22/2021 ?Cycle 3  5-FU/liposomal irinotecan 10/20/2021 ?Cycle 4 5-FU/liposomal irinotecan 11/04/2021 ?Cycle 5 5-FU/liposomal irinotecan 11/17/2021 ?MRI abdomen 11/27/2021-generally unchanged appearance of the pancreas with a very ill-defined hypoenhancing mass centered within the pancreatic neck, again appears to encase the celiac axis and encasing occlude the superior mesenteric vein and splenic vein; interval enlargement of multiple liver lesions; numerous lung nodules in the bilateral lung bases; unchanged splenomegaly;  multiple new heterogeneously and hypoenhancing subcapsular lesions of the spleen most consistent with infarctions; small volume ascites throughout the abdomen; distended, debris-filled stomach suggesting some degree of gastric outlet and/or duodenal obstruction. ?Cycle 6 5-FU/liposomal irinotecan 12/01/2021 ?Cycle 7 5-FU/liposomal irinotecan 12/14/2021 ?Cycle 8 5-FU/liposomal irinotecan 01/12/2022 ?Cycle 9 5-FU/liposomal irinotecan 01/26/2022 ?CTs 02/06/2022-slight increase in size of liver metastases, indeterminate spleen lesion suspicious for metastasis, scattered reticulonodular densities in the lungs ?  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   Admission with recurrent GI bleeding and severe anemia 09/15/2021 ?Upper endoscopy 09/17/2021-esophageal varices with stigmata of recent bleeding, banded.  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  Influenza A 10/13/2021 ?11.  Renal insufficiency February 2023-renal ultrasound 01/07/2022-increase renal cortical echogenicity bilaterally, mild bilateral renal pelvic fullness; referred to nephrology ?12.  Admission 02/09/2022 for evaluation and management of progressive renal failure ?13.   Klebsiella urinary tract infection 02/12/2022 ?  ? ? ?Disposition: ?Mr. Weatherall has metastatic pancreas cancer.  His performance status has declined over the past several weeks.  I suspect this is due to progression of

## 2022-03-05 NOTE — Telephone Encounter (Signed)
Per Dr Benay Spice referral sent to [facility]. ?

## 2022-03-08 ENCOUNTER — Inpatient Hospital Stay: Payer: Medicare Other

## 2022-03-08 DIAGNOSIS — C250 Malignant neoplasm of head of pancreas: Secondary | ICD-10-CM

## 2022-03-08 DIAGNOSIS — Z5111 Encounter for antineoplastic chemotherapy: Secondary | ICD-10-CM | POA: Diagnosis not present

## 2022-03-08 MED ORDER — SODIUM CHLORIDE 0.9% FLUSH
10.0000 mL | INTRAVENOUS | Status: AC | PRN
  Administered 2022-03-08: 10 mL

## 2022-03-08 MED ORDER — HEPARIN SOD (PORK) LOCK FLUSH 100 UNIT/ML IV SOLN
500.0000 [IU] | Freq: Every day | INTRAVENOUS | Status: AC | PRN
  Administered 2022-03-08: 500 [IU]

## 2022-03-08 MED ORDER — SODIUM CHLORIDE 0.9% IV SOLUTION
250.0000 mL | Freq: Once | INTRAVENOUS | Status: AC
  Administered 2022-03-08: 250 mL via INTRAVENOUS

## 2022-03-08 NOTE — Patient Instructions (Signed)
Blood Transfusion, Adult, Care After This sheet gives you information about how to care for yourself after your procedure. Your doctor may also give you more specific instructions. If you have problems or questions, contact your doctor. What can I expect after the procedure? After the procedure, it is common to have: Bruising and soreness at the IV site. A headache. Follow these instructions at home: Insertion site care     Follow instructions from your doctor about how to take care of your insertion site. This is where an IV tube was put into your vein. Make sure you: Wash your hands with soap and water before and after you change your bandage (dressing). If you cannot use soap and water, use hand sanitizer. Change your bandage as told by your doctor. Check your insertion site every day for signs of infection. Check for: Redness, swelling, or pain. Bleeding from the site. Warmth. Pus or a bad smell. General instructions Take over-the-counter and prescription medicines only as told by your doctor. Rest as told by your doctor. Go back to your normal activities as told by your doctor. Keep all follow-up visits as told by your doctor. This is important. Contact a doctor if: You have itching or red, swollen areas of skin (hives). You feel worried or nervous (anxious). You feel weak after doing your normal activities. You have redness, swelling, warmth, or pain around the insertion site. You have blood coming from the insertion site, and the blood does not stop with pressure. You have pus or a bad smell coming from the insertion site. Get help right away if: You have signs of a serious reaction. This may be coming from an allergy or the body's defense system (immune system). Signs include: Trouble breathing or shortness of breath. Swelling of the face or feeling warm (flushed). Fever or chills. Head, chest, or back pain. Dark pee (urine) or blood in the pee. Widespread rash. Fast  heartbeat. Feeling dizzy or light-headed. You may receive your blood transfusion in an outpatient setting. If so, you will be told whom to contact to report any reactions. These symptoms may be an emergency. Do not wait to see if the symptoms will go away. Get medical help right away. Call your local emergency services (911 in the [place] Do not drive yourself to the hospital. Summary Bruising and soreness at the IV site are common. Check your insertion site every day for signs of infection. Rest as told by your doctor. Go back to your normal activities as told by your doctor. Get help right away if you have signs of a serious reaction. This information is not intended to replace advice given to you by your health care provider. Make sure you discuss any questions you have with your health care provider. Document Revised: 02/26/2021 Document Reviewed: 04/26/2019 Elsevier Patient Education  2023 [employer]  

## 2022-03-09 LAB — TYPE AND SCREEN
ABO/RH(D): A POS
Antibody Screen: NEGATIVE
Unit division: 0
Unit division: 0

## 2022-03-09 LAB — BPAM RBC
Blood Product Expiration Date: 202305052359
Blood Product Expiration Date: 202305052359
ISSUE DATE / TIME: 202304240743
ISSUE DATE / TIME: 202304240743
Unit Type and Rh: 6200
Unit Type and Rh: 6200

## 2022-03-16 ENCOUNTER — Other Ambulatory Visit: Payer: Self-pay | Admitting: Oncology

## 2022-03-16 DIAGNOSIS — C250 Malignant neoplasm of head of pancreas: Secondary | ICD-10-CM

## 2022-03-26 ENCOUNTER — Inpatient Hospital Stay: Payer: Medicare Other | Attending: Genetic Counselor | Admitting: Oncology

## 2022-03-26 VITALS — BP 120/69 | HR 82 | Temp 98.2°F | Resp 18 | Ht 69.0 in | Wt 113.0 lb

## 2022-03-26 DIAGNOSIS — D696 Thrombocytopenia, unspecified: Secondary | ICD-10-CM | POA: Diagnosis not present

## 2022-03-26 DIAGNOSIS — C250 Malignant neoplasm of head of pancreas: Secondary | ICD-10-CM | POA: Insufficient documentation

## 2022-03-26 DIAGNOSIS — E119 Type 2 diabetes mellitus without complications: Secondary | ICD-10-CM | POA: Insufficient documentation

## 2022-03-26 DIAGNOSIS — C787 Secondary malignant neoplasm of liver and intrahepatic bile duct: Secondary | ICD-10-CM | POA: Diagnosis present

## 2022-03-26 DIAGNOSIS — R188 Other ascites: Secondary | ICD-10-CM | POA: Insufficient documentation

## 2022-03-26 DIAGNOSIS — D709 Neutropenia, unspecified: Secondary | ICD-10-CM | POA: Diagnosis not present

## 2022-03-26 DIAGNOSIS — R162 Hepatomegaly with splenomegaly, not elsewhere classified: Secondary | ICD-10-CM | POA: Insufficient documentation

## 2022-03-26 DIAGNOSIS — R918 Other nonspecific abnormal finding of lung field: Secondary | ICD-10-CM | POA: Diagnosis not present

## 2022-03-26 DIAGNOSIS — C259 Malignant neoplasm of pancreas, unspecified: Secondary | ICD-10-CM

## 2022-03-26 DIAGNOSIS — Z87440 Personal history of urinary (tract) infections: Secondary | ICD-10-CM | POA: Diagnosis not present

## 2022-03-26 DIAGNOSIS — I10 Essential (primary) hypertension: Secondary | ICD-10-CM | POA: Diagnosis not present

## 2022-03-26 DIAGNOSIS — M549 Dorsalgia, unspecified: Secondary | ICD-10-CM | POA: Diagnosis not present

## 2022-03-26 NOTE — Progress Notes (Signed)
?[facility] ?OFFICE PROGRESS NOTE ? ? ?Diagnosis: Pancreas cancer ? ?INTERVAL HISTORY:  ? ?Aaron Johnston returns as scheduled.  He had an episode of vomiting after receiving Red cell transfusions 03/08/2022.  He has enrolled in home hospice care.  The hospice nurses visiting weekly.  He is here today with his wife.  Aaron Johnston has a poor appetite.  He reports early satiety with liquids.  No bleeding. ? ?Objective: ? ?Vital signs in last 24 hours: ? ?Blood pressure 120/69, pulse 82, temperature 98.2 ?F (36.8 ?C), temperature source Oral, resp. rate 18, height _0  (1.753 m), weight 113 lb (51.3 kg), SpO2 100 %. ?  ? ?HEENT: No thrush  ?Resp: Lungs clear bilaterally ?Cardio: Regular rate and rhythm ?GI: Mildly distended, no mass, nontender ?Vascular: Trace edema at the lower leg and ankle bilaterally  ? ?Portacath/PICC-without erythema ? ?Lab Results: ? ?Lab Results  ?Component Value Date  ? WBC 14.5 (H) 03/05/2022  ? HGB 7.5 (L) 03/05/2022  ? HCT 24.2 (L) 03/05/2022  ? MCV 98.4 03/05/2022  ? PLT 100 (L) 03/05/2022  ? NEUTROABS 12.2 (H) 03/05/2022  ? ? ?CMP  ?Lab Results  ?Component Value Date  ? NA 140 03/05/2022  ? K 4.2 03/05/2022  ? CL 121 (H) 03/05/2022  ? CO2 7 (L) 03/05/2022  ? GLUCOSE 146 (H) 03/05/2022  ? BUN 39 (H) 03/05/2022  ? CREATININE 3.53 (HH) 03/05/2022  ? CALCIUM 8.9 03/05/2022  ? PROT 6.6 03/05/2022  ? ALBUMIN 3.0 (L) 03/05/2022  ? AST 37 03/05/2022  ? ALT 41 03/05/2022  ? ALKPHOS 374 (H) 03/05/2022  ? BILITOT 0.8 03/05/2022  ? GFRNONAA 17 (L) 03/05/2022  ? GFRAA NOT CALCULATED 02/23/2021  ? ? ?Lab Results  ?Component Value Date  ? GGY694 8,546 (H) 02/19/2022  ? ? ? ?Medications: I have reviewed the patient's current medications. ? ? ?Assessment/Plan: ? ?Pancreas cancer ?CT urology center 03/04/2020-haziness of the fat adjacent to the celiac axis and SMA with focal narrowing of the SMV, splenic vein, and splenoportal confluence ?CT 07/07/2020-infiltrative retroperitoneal mass with  encasement of the celiac axis and SMA, high-grade narrowing of the proximal portal vein with cavernous transformation, nonocclusive thrombus within the main portal vein, mild biliary ductal dilatation, diffuse hypodense/hypoenhancing areas within the liver (edema versus infiltrating neoplasm), 2 x 1.7 cm area of focal prominence in the pancreas ?EUS 07/10/2020-no pancreas mass identified, 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, 1.5 cm aortocaval node biopsy-scant lymphoid material, no malignancy ?Diagnostic laparoscopy 07/17/2020-segment 3 and 4 liver nodules, adenocarcinoma, positive for pankeratin, CK7 and CK20, partially positive for TTF-1, negative for CDX2 ?Foundation 1-microsatellite stable, tumor mutation burden 4, K-ras G12D, subclonal RB1 alteration ?Elevated CA 19-9 ?Cycle 1 FOLFOX 07/28/2020 ?Cycle 2 FOLFOX 08/18/2020, oxaliplatin dose reduced due to thrombocytopenia ?Cycle 3 FOLFOX 09/08/2020 ?Cycle 4 FOLFOX 09/29/2020  ?Cycle 5 FOLFOX 10/20/2020 ?CTs 11/06/2020-grossly stable infiltrative mass within the pancreatic head and porta hepatis.  New small low-density liver lesions.  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 ?Cycle 6 FOLFOX 11/10/2021  ?Cycle 7 FOLFOX 12/01/2020 ?MRI liver 12/04/2020-no significant change in posttreatment appearance of the pancreatic head.  Numerous intrinsically low signal hypoenhancing lesions of the liver parenchyma predominantly observed in the right lobe of the liver and liver dome, measuring no greater than 8 mm and as seen on recent prior CT of the abdomen/pelvis. ?Cycle 8 FOLFOX  12/22/2020 ?Cycle 9 FOLFOX 01/12/2021 ?Cycle 10 FOLFOX 02/02/2021 ?MRI liver 02/19/2021-no change in pancreas head mass, multiple hypoenhancing liver lesions are new and increased in size, effacement of portal vein by pancreas head mass with cavernous transformation, no  change in mild splenomegaly ?Cycle 1 gemcitabine/Abraxane 03/12/2021 ?Cycle 2 gemcitabine/Abraxane 03/27/2021- dose reductions secondary to neutropenia ?Cycle 3 gemcitabine/Abraxane 04/17/2021-dose reductions due to thrombocytopenia, white cell growth factor support added ?Cycle 4 gemcitabine/Abraxane 05/01/2021, Ziextenzo ?Cycle 5 gemcitabine/Abraxane 05/15/2021, Ziextenzo ?MRI abdomen 05/26/2021-mild enlargement of dominant hepatic metastases, appearance of infiltrative pancreas mass extending to the celiac trunk and SMA, splenic vein occlusion, cavernous transformation of the portal vein with upper abdomen collaterals, splenomegaly, mild upper abdominal ascites and mesenteric edema, minimal subpleural nodularity left lower lobe ?Cycle 6 gemcitabine/Abraxane 05/28/2021 ?Cycle 7 gemcitabine/Abraxane 06/12/2021 ?Cycle 8 gemcitabine/Abraxane 06/26/2021 ?Cycle 9 gemcitabine/Abraxane 07/10/2021 ?Cycle 10 gemcitabine/Abraxane 07/23/2021 ?Cycle 11 gemcitabine/Abraxane 08/06/2021 ?MRI abdomen 08/20/2021-multifocal nodularity in the lower chest with a new discrete right lower lobe nodule, enlarging subsegment 7/8 liver lesion, other small lesions appear stable, gastric varices, review of images with radiology-enlargement of at least 2 liver lesions, suspicious nodule n the right lower lung ?Cycle 1  5-FU/liposomal irinotecan 09/08/2021 ?Cycle 2  5-FU/liposomal irinotecan 09/22/2021 ?Cycle 3  5-FU/liposomal irinotecan 10/20/2021 ?Cycle 4 5-FU/liposomal irinotecan 11/04/2021 ?Cycle 5 5-FU/liposomal irinotecan 11/17/2021 ?MRI abdomen 11/27/2021-generally unchanged appearance of the pancreas with a very ill-defined hypoenhancing mass centered within the pancreatic neck, again appears to encase the celiac axis and encasing occlude the superior mesenteric vein and splenic vein; interval enlargement of multiple liver lesions; numerous lung nodules in the bilateral lung bases; unchanged splenomegaly; multiple new heterogeneously and hypoenhancing  subcapsular lesions of the spleen most consistent with infarctions; small volume ascites throughout the abdomen; distended, debris-filled stomach suggesting some degree of gastric outlet and/or duodenal obstruction. ?Cycle 6 5-FU/liposomal irinotecan 12/01/2021 ?Cycle 7 5-FU/liposomal irinotecan 12/14/2021 ?Cycle 8 5-FU/liposomal irinotecan 01/12/2022 ?Cycle 9 5-FU/liposomal irinotecan 01/26/2022 ?CTs 02/06/2022-slight increase in size of liver metastases, indeterminate spleen lesion suspicious for metastasis, scattered reticulonodular densities in the lungs ?  ?Chronic thrombocytopenia- weekly Nplate beginning 1/61/0960 ?Diabetes ?Hypertension ?Abdomen/back pain secondary to #1 ?Oxaliplatin neuropathy-moderate loss of vibratory sense on exam 01/12/2021, 02/02/2021 ?Neutropenia following gemcitabine/Abraxane chemotherapy-G-CSF added beginning with cycle 3 ?8.   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   Admission with recurrent GI bleeding and severe anemia 09/15/2021 ?Upper endoscopy 09/17/2021-esophageal varices with stigmata of recent bleeding, banded.  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  Influenza A 10/13/2021 ?11.  Renal insufficiency February 2023-renal ultrasound 01/07/2022-increase renal cortical echogenicity bilaterally, mild bilateral renal pelvic fullness; referred to nephrology ?12.  Admission 02/09/2022 for evaluation and management of progressive renal failure ?13.  Klebsiella urinary tract infection 02/12/2022 ?   ? ? 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  He has enrolled in hospice care.  His performance status appears to be slowly declining.  We reviewed his medication list.  He will discontinue potassiu

## 2022-04-07 ENCOUNTER — Emergency Department (HOSPITAL_COMMUNITY)

## 2022-04-07 ENCOUNTER — Telehealth: Payer: Self-pay

## 2022-04-07 ENCOUNTER — Other Ambulatory Visit: Payer: Self-pay

## 2022-04-07 ENCOUNTER — Encounter (HOSPITAL_COMMUNITY): Payer: Self-pay | Admitting: Radiology

## 2022-04-07 ENCOUNTER — Observation Stay (HOSPITAL_COMMUNITY)
Admission: EM | Admit: 2022-04-07 | Discharge: 2022-04-15 | DRG: 951 | Disposition: E | Attending: Family Medicine | Admitting: Family Medicine

## 2022-04-07 DIAGNOSIS — R578 Other shock: Secondary | ICD-10-CM | POA: Diagnosis present

## 2022-04-07 DIAGNOSIS — R64 Cachexia: Secondary | ICD-10-CM | POA: Diagnosis present

## 2022-04-07 DIAGNOSIS — N179 Acute kidney failure, unspecified: Secondary | ICD-10-CM | POA: Diagnosis present

## 2022-04-07 DIAGNOSIS — Z515 Encounter for palliative care: Principal | ICD-10-CM

## 2022-04-07 DIAGNOSIS — K922 Gastrointestinal hemorrhage, unspecified: Secondary | ICD-10-CM | POA: Diagnosis present

## 2022-04-07 DIAGNOSIS — R079 Chest pain, unspecified: Secondary | ICD-10-CM | POA: Diagnosis present

## 2022-04-07 DIAGNOSIS — E119 Type 2 diabetes mellitus without complications: Secondary | ICD-10-CM | POA: Diagnosis not present

## 2022-04-07 DIAGNOSIS — E43 Unspecified severe protein-calorie malnutrition: Secondary | ICD-10-CM | POA: Diagnosis not present

## 2022-04-07 DIAGNOSIS — Z66 Do not resuscitate: Secondary | ICD-10-CM | POA: Diagnosis present

## 2022-04-07 DIAGNOSIS — Z800 Family history of malignant neoplasm of digestive organs: Secondary | ICD-10-CM | POA: Diagnosis not present

## 2022-04-07 DIAGNOSIS — Z803 Family history of malignant neoplasm of breast: Secondary | ICD-10-CM

## 2022-04-07 DIAGNOSIS — Z833 Family history of diabetes mellitus: Secondary | ICD-10-CM

## 2022-04-07 DIAGNOSIS — C250 Malignant neoplasm of head of pancreas: Secondary | ICD-10-CM | POA: Diagnosis present

## 2022-04-07 DIAGNOSIS — Z681 Body mass index (BMI) 19 or less, adult: Secondary | ICD-10-CM

## 2022-04-07 DIAGNOSIS — K920 Hematemesis: Secondary | ICD-10-CM | POA: Diagnosis present

## 2022-04-07 DIAGNOSIS — R4182 Altered mental status, unspecified: Secondary | ICD-10-CM | POA: Diagnosis present

## 2022-04-07 DIAGNOSIS — Z79899 Other long term (current) drug therapy: Secondary | ICD-10-CM | POA: Diagnosis not present

## 2022-04-07 DIAGNOSIS — Z808 Family history of malignant neoplasm of other organs or systems: Secondary | ICD-10-CM | POA: Diagnosis not present

## 2022-04-07 DIAGNOSIS — Z87891 Personal history of nicotine dependence: Secondary | ICD-10-CM

## 2022-04-07 DIAGNOSIS — R042 Hemoptysis: Secondary | ICD-10-CM | POA: Diagnosis not present

## 2022-04-07 MED ORDER — SODIUM CHLORIDE 0.9 % IV BOLUS
500.0000 mL | Freq: Once | INTRAVENOUS | Status: AC
  Administered 2022-04-08: 500 mL via INTRAVENOUS

## 2022-04-07 NOTE — ED Provider Notes (Signed)
[facility] DEPT Provider Note   CSN: [id number] Arrival date & time: 04/14/2022  2329     History {Add pertinent medical, surgical, social history, OB history to HPI:1} Chief Complaint  Patient presents with   Chest Pain   Hemoptysis    Aaron Johnston is a 76 y.o. male.  The history is provided by the patient, the EMS personnel, medical records and the spouse.  Chest Pain Aaron Johnston is a 76 y.o. male who presents to the Emergency Department complaining of *** V blood 5pm.  CP/AP since yesterday.  Received asa 325 prior to ED arrival    Home Medications Prior to Admission medications   Medication Sig Start Date End Date Taking? Authorizing Provider  Accu-Chek FastClix Lancets MISC Apply topically. 08/30/20   [provider]  ACCU-CHEK GUIDE test strip  08/31/20   [provider]  Blood Glucose Monitoring Suppl (ACCU-CHEK GUIDE) w/Device KIT See admin instructions. 08/03/20   [provider]  diphenoxylate-atropine (LOMOTIL) 2.5-0.025 MG tablet TAKE 1-2 TABLETS BY MOUTH 4 (FOUR) TIMES DAILY AS NEEDED FOR DIARRHEA OR LOOSE STOOLS. 02/22/22   Ladell Pier, MD  HYDROcodone-acetaminophen (NORCO/VICODIN) 5-325 MG tablet Take 1-2 tablets by mouth every 4 (four) hours as needed. Patient taking differently: Take 1 tablet by mouth every 4 (four) hours as needed. 01/04/22   Owens Shark, NP  KLOR-CON M20 20 MEQ tablet TAKE 1 TABLET BY MOUTH TWICE A DAY 03/18/22   Owens Shark, NP  latanoprost (XALATAN) 0.005 % ophthalmic solution Place 1 drop into both eyes at bedtime.    [provider]  lidocaine-prilocaine (EMLA) cream Apply 1 application topically as directed. Apply to port site 1 hour prior to stick and cover with plastic wrap Patient taking differently: Apply 1 application. topically as needed (chemo port site). 10/19/21   Owens Shark, NP  lipase/protease/amylase (CREON) 36000 UNITS CPEP capsule Take 2 capsules  (72,000 Units total) by mouth 3 (three) times daily with meals. May also take 1 capsule (36,000 Units total) as needed. Patient taking differently: 2 capsules (72,000units) 3 times daily with meal. An additional 1 capsule(36,000 units) for snack 08/06/21   Owens Shark, NP  loperamide (IMODIUM) 2 MG capsule Take 4 mg by mouth 3 (three) times daily as needed for diarrhea or loose stools.    [provider]  loratadine (CLARITIN) 10 MG tablet Take 10 mg by mouth daily.    [provider]  magnesium oxide (MAG-OX) 400 (240 Mg) MG tablet TAKE 1 TABLET BY MOUTH TWICE A DAY Patient taking differently: Take 400 mg by mouth 2 (two) times daily. 01/08/22   Ladell Pier, MD  mirabegron ER (MYRBETRIQ) 25 MG TB24 tablet Take 1 tablet (25 mg total) by mouth daily for 30 doses. 02/16/22 03/18/22  Bonnell Public Tublu, MD  ondansetron (ZOFRAN) 8 MG tablet TAKE 1 TABLET (8 MG TOTAL) BY MOUTH EVERY 8 (EIGHT) HOURS AS NEEDED FOR NAUSEA OR VOMITING. 03/18/22   Owens Shark, NP  pantoprazole (PROTONIX) 40 MG tablet TAKE 1 TABLET BY MOUTH TWICE A DAY Patient taking differently: 40 mg 2 (two) times daily. 12/28/21   Ladell Pier, MD  pioglitazone (ACTOS) 30 MG tablet Take 30 mg by mouth daily. 02/01/22   [provider]  prochlorperazine (COMPAZINE) 10 MG tablet Take 1 tablet (10 mg total) by mouth every 6 (six) hours as needed for nausea. 01/01/22   Owens Shark, NP  sulfamethoxazole-trimethoprim (BACTRIM) 400-80 MG  tablet Take 1 tablet by mouth 2 (two) times daily. 03/02/22   [provider]      Allergies    Patient has no known allergies.    Review of Systems   Review of Systems  Cardiovascular:  Positive for chest pain.   Physical Exam Updated Vital Signs BP 94/64 (BP Location: Right Arm)   Pulse (!) 51   Resp 18   Ht _0  (1.753 m)   Wt 51.3 kg   SpO2 99%   BMI 16.69 kg/m  Physical Exam  ED Results / Procedures / Treatments   Labs (all labs ordered are  listed, but only abnormal results are displayed) Labs Reviewed  COMPREHENSIVE METABOLIC PANEL  CBC WITH DIFFERENTIAL/PLATELET  PROTIME-INR  LIPASE, BLOOD    EKG None  Radiology No results found.  Procedures Procedures  {Document cardiac monitor, telemetry assessment procedure when appropriate:1}  Medications Ordered in ED Medications  sodium chloride 0.9 % bolus 500 mL (has no administration in time range)    ED Course/ Medical Decision Making/ A&P                           Medical Decision Making Amount and/or Complexity of Data Reviewed Labs: ordered. Radiology: ordered.   ***  {Document critical care time when appropriate:1} {Document review of labs and clinical decision tools ie heart score, Chads2Vasc2 etc:1}  {Document your independent review of radiology images, and any outside records:1} {Document your discussion with family members, caretakers, and with consultants:1} {Document social determinants of health affecting pt's care:1} {Document your decision making why or why not admission, treatments were needed:1} Final Clinical Impression(s) / ED Diagnoses Final diagnoses:  None    Rx / DC Orders ED Discharge Orders     None

## 2022-04-07 NOTE — Telephone Encounter (Signed)
TC from hospice Nurse Marylouise Stacks giving report on Pt. Nurse stated Pt's wife called stated Pt was having chest pain and pressure.Pt's wife stated she gave him oxycodone pain scale rechecked, pain went down to a 7 hospice Nurse stated when she arrived Pt's oxygen was 84 % gave him oxygen sat went up to 93% hospice nurse reported Pt is having restlessness and inquiring about a medication to be called in. Discussed with Dr Benay Spice he prescribed Ativan .5 mg q 8 prn as needed. Pt has been bed bound since Sunday only eating a few bites. Hospice Nurse requesting if Dr Benay Spice can talk to Pt's wife about DNR. Dr Benay Spice will call to talk to wife about DNR.

## 2022-04-08 DIAGNOSIS — Z800 Family history of malignant neoplasm of digestive organs: Secondary | ICD-10-CM | POA: Diagnosis not present

## 2022-04-08 DIAGNOSIS — R578 Other shock: Secondary | ICD-10-CM | POA: Diagnosis not present

## 2022-04-08 DIAGNOSIS — Z808 Family history of malignant neoplasm of other organs or systems: Secondary | ICD-10-CM | POA: Diagnosis not present

## 2022-04-08 DIAGNOSIS — R64 Cachexia: Secondary | ICD-10-CM | POA: Diagnosis present

## 2022-04-08 DIAGNOSIS — E119 Type 2 diabetes mellitus without complications: Secondary | ICD-10-CM | POA: Diagnosis present

## 2022-04-08 DIAGNOSIS — R042 Hemoptysis: Secondary | ICD-10-CM | POA: Diagnosis present

## 2022-04-08 DIAGNOSIS — Z803 Family history of malignant neoplasm of breast: Secondary | ICD-10-CM | POA: Diagnosis not present

## 2022-04-08 DIAGNOSIS — Z515 Encounter for palliative care: Secondary | ICD-10-CM | POA: Diagnosis not present

## 2022-04-08 DIAGNOSIS — Z79899 Other long term (current) drug therapy: Secondary | ICD-10-CM | POA: Diagnosis not present

## 2022-04-08 DIAGNOSIS — N179 Acute kidney failure, unspecified: Secondary | ICD-10-CM | POA: Diagnosis present

## 2022-04-08 DIAGNOSIS — K922 Gastrointestinal hemorrhage, unspecified: Secondary | ICD-10-CM

## 2022-04-08 DIAGNOSIS — E43 Unspecified severe protein-calorie malnutrition: Secondary | ICD-10-CM | POA: Diagnosis present

## 2022-04-08 DIAGNOSIS — Z87891 Personal history of nicotine dependence: Secondary | ICD-10-CM | POA: Diagnosis not present

## 2022-04-08 DIAGNOSIS — R4182 Altered mental status, unspecified: Secondary | ICD-10-CM | POA: Diagnosis present

## 2022-04-08 DIAGNOSIS — C250 Malignant neoplasm of head of pancreas: Secondary | ICD-10-CM

## 2022-04-08 DIAGNOSIS — Z66 Do not resuscitate: Secondary | ICD-10-CM | POA: Diagnosis present

## 2022-04-08 DIAGNOSIS — Z681 Body mass index (BMI) 19 or less, adult: Secondary | ICD-10-CM | POA: Diagnosis not present

## 2022-04-08 DIAGNOSIS — R079 Chest pain, unspecified: Secondary | ICD-10-CM | POA: Diagnosis present

## 2022-04-08 DIAGNOSIS — Z833 Family history of diabetes mellitus: Secondary | ICD-10-CM | POA: Diagnosis not present

## 2022-04-08 DIAGNOSIS — K920 Hematemesis: Secondary | ICD-10-CM | POA: Diagnosis present

## 2022-04-08 LAB — COMPREHENSIVE METABOLIC PANEL
ALT: 25 U/L (ref 0–44)
AST: 24 U/L (ref 15–41)
Albumin: 2 g/dL — ABNORMAL LOW (ref 3.5–5.0)
Alkaline Phosphatase: 321 U/L — ABNORMAL HIGH (ref 38–126)
Anion gap: 13 (ref 5–15)
BUN: 79 mg/dL — ABNORMAL HIGH (ref 8–23)
CO2: 10 mmol/L — ABNORMAL LOW (ref 22–32)
Calcium: 8.1 mg/dL — ABNORMAL LOW (ref 8.9–10.3)
Chloride: 121 mmol/L — ABNORMAL HIGH (ref 98–111)
Creatinine, Ser: 4.93 mg/dL — ABNORMAL HIGH (ref 0.61–1.24)
GFR, Estimated: 12 mL/min — ABNORMAL LOW (ref >60)
Glucose, Bld: 187 mg/dL — ABNORMAL HIGH (ref 70–99)
Potassium: 4.5 mmol/L (ref 3.5–5.1)
Sodium: 144 mmol/L (ref 135–145)
Total Bilirubin: 1.2 mg/dL (ref 0.3–1.2)
Total Protein: 6.1 g/dL — ABNORMAL LOW (ref 6.5–8.1)

## 2022-04-08 LAB — CBC WITH DIFFERENTIAL/PLATELET
Abs Immature Granulocytes: 0.07 10*3/uL (ref 0.00–0.07)
Basophils Absolute: 0 10*3/uL (ref 0.0–0.1)
Basophils Relative: 0 %
Eosinophils Absolute: 0 10*3/uL (ref 0.0–0.5)
Eosinophils Relative: 0 %
HCT: 29.1 % — ABNORMAL LOW (ref 39.0–52.0)
Hemoglobin: 8.8 g/dL — ABNORMAL LOW (ref 13.0–17.0)
Immature Granulocytes: 1 %
Lymphocytes Relative: 9 %
Lymphs Abs: 0.6 10*3/uL — ABNORMAL LOW (ref 0.7–4.0)
MCH: 30.2 pg (ref 26.0–34.0)
MCHC: 30.2 g/dL (ref 30.0–36.0)
MCV: 100 fL (ref 80.0–100.0)
Monocytes Absolute: 0.2 10*3/uL (ref 0.1–1.0)
Monocytes Relative: 3 %
Neutro Abs: 5.4 10*3/uL (ref 1.7–7.7)
Neutrophils Relative %: 87 %
Platelets: 87 10*3/uL — ABNORMAL LOW (ref 150–400)
RBC: 2.91 MIL/uL — ABNORMAL LOW (ref 4.22–5.81)
RDW: 16.4 % — ABNORMAL HIGH (ref 11.5–15.5)
WBC: 6.3 10*3/uL (ref 4.0–10.5)
nRBC: 0 % (ref 0.0–0.2)

## 2022-04-08 LAB — LIPASE, BLOOD: Lipase: 22 U/L (ref 11–51)

## 2022-04-08 LAB — PROTIME-INR
INR: 1.7 — ABNORMAL HIGH (ref 0.8–1.2)
Prothrombin Time: 19.4 seconds — ABNORMAL HIGH (ref 11.4–15.2)

## 2022-04-08 MED ORDER — BIOTENE DRY MOUTH MT LIQD: 15.0000 mL | OROMUCOSAL | Status: DC | PRN

## 2022-04-08 MED ORDER — HALOPERIDOL 0.5 MG PO TABS: 0.5000 mg | ORAL_TABLET | ORAL | Status: DC | PRN

## 2022-04-08 MED ORDER — FENTANYL CITRATE PF 50 MCG/ML IJ SOSY
50.0000 ug | PREFILLED_SYRINGE | Freq: Once | INTRAMUSCULAR | Status: AC
  Administered 2022-04-08: 50 ug via INTRAVENOUS
  Filled 2022-04-08: qty 1

## 2022-04-08 MED ORDER — ACETAMINOPHEN 325 MG PO TABS: 650.0000 mg | ORAL_TABLET | Freq: Four times a day (QID) | ORAL | Status: DC | PRN

## 2022-04-08 MED ORDER — FENTANYL 2500MCG IN NS 250ML (10MCG/ML) PREMIX INFUSION
0.0000 ug/h | INTRAVENOUS | Status: DC
  Filled 2022-04-08: qty 250

## 2022-04-08 MED ORDER — GLYCOPYRROLATE 0.2 MG/ML IJ SOLN: 0.2000 mg | INTRAMUSCULAR | Status: DC | PRN

## 2022-04-08 MED ORDER — HALOPERIDOL LACTATE 5 MG/ML IJ SOLN: 0.5000 mg | INTRAMUSCULAR | Status: DC | PRN

## 2022-04-08 MED ORDER — ONDANSETRON 4 MG PO TBDP: 4.0000 mg | ORAL_TABLET | Freq: Four times a day (QID) | ORAL | Status: DC | PRN

## 2022-04-08 MED ORDER — POLYVINYL ALCOHOL 1.4 % OP SOLN
1.0000 [drp] | Freq: Four times a day (QID) | OPHTHALMIC | Status: DC | PRN
  Filled 2022-04-08: qty 15

## 2022-04-08 MED ORDER — HALOPERIDOL LACTATE 2 MG/ML PO CONC
0.5000 mg | ORAL | Status: DC | PRN
  Filled 2022-04-08: qty 5

## 2022-04-08 MED ORDER — MORPHINE 100MG IN NS 100ML (1MG/ML) PREMIX INFUSION
0.0000 mg/h | INTRAVENOUS | Status: DC
  Filled 2022-04-08: qty 100

## 2022-04-08 MED ORDER — MORPHINE BOLUS VIA INFUSION: 1.0000 mg | INTRAVENOUS | Status: DC | PRN

## 2022-04-08 MED ORDER — ACETAMINOPHEN 650 MG RE SUPP: 650.0000 mg | Freq: Four times a day (QID) | RECTAL | Status: DC | PRN

## 2022-04-08 MED ORDER — MORPHINE SULFATE (PF) 4 MG/ML IV SOLN
4.0000 mg | Freq: Once | INTRAVENOUS | Status: AC
  Administered 2022-04-08: 4 mg via INTRAVENOUS
  Filled 2022-04-08: qty 1

## 2022-04-08 MED ORDER — FENTANYL BOLUS VIA INFUSION: 10.0000 ug | INTRAVENOUS | Status: DC | PRN

## 2022-04-08 MED ORDER — GLYCOPYRROLATE 1 MG PO TABS
1.0000 mg | ORAL_TABLET | ORAL | Status: DC | PRN
  Filled 2022-04-08: qty 1

## 2022-04-08 MED ORDER — ONDANSETRON HCL 4 MG/2ML IJ SOLN: 4.0000 mg | Freq: Four times a day (QID) | INTRAMUSCULAR | Status: DC | PRN

## 2022-04-08 MED ORDER — FENTANYL CITRATE PF 50 MCG/ML IJ SOSY
25.0000 ug | PREFILLED_SYRINGE | INTRAMUSCULAR | Status: DC | PRN
  Filled 2022-04-08: qty 1

## 2022-04-08 MED ORDER — ONDANSETRON HCL 4 MG/2ML IJ SOLN
4.0000 mg | Freq: Once | INTRAMUSCULAR | Status: AC
  Administered 2022-04-08: 4 mg via INTRAVENOUS
  Filled 2022-04-08: qty 2

## 2022-04-15 NOTE — Assessment & Plan Note (Signed)
Pt with 2 year history of pancreatic cancer, now on hospice.

## 2022-04-15 NOTE — Progress Notes (Signed)
Manufacturing engineer [facility]) Hospital Liaison Note:  [name] is a patient of Manufacturing engineer, admitted to our service on 03/09/2022 with a [facility] diagnosis of malignant neoplasm of head of pancreas and secondary malignant neoplasm of liver and intrahepatic bile duct. Patient is listed as a Full Code with our agency - [facility] discussions in progress.     On 03/08/22, patient began experiencing chest pain/nausea and had been restless for 2 days. An [facility] nurse visit was made, after several doses of Hydrocodone for uncontrolled pain/symptoms with large emesis of blood, family decided to transport patient to ED for evaluation.   An [place] will follow this patient during this hospitalization to support as needed.  Please call with any hospice related questions,  Gar Ponto, RN [facility] (on [place]) [phone]

## 2022-04-15 NOTE — Progress Notes (Signed)
Pt's admitted for End of life care. Has an order of fentanyl drip, but wife does want RN to start it yet because she's still waiting for their son and she said that pt looks comfortable right now.

## 2022-04-15 NOTE — ED Notes (Signed)
Port accessed and warming blanket applied.

## 2022-04-15 NOTE — Assessment & Plan Note (Signed)
Shock with hypotension, presumably hemorrhagic in setting of UGIB with hematemesis.

## 2022-04-15 NOTE — Discharge Summary (Signed)
Death Summary  Aaron Johnston GYB:[id number] DOB: 07/19/1946 DOA: 2022/04/27  PCP: [name], [name], MD   Admit date: Apr 27, 2022 Date of Death: 04-28-22  Final Diagnoses:  Principal Problem:   Admission for end of life care Active Problems:   Primary cancer of head of pancreas ([place])   UGIB (upper gastrointestinal bleed)   Hemorrhagic shock (HCC)   Protein-calorie malnutrition, severe      History of present illness:  76 year old male with medical history of pancreatic cancer for past 2 years now enrolled in hospice for pancreatic cancer, functional decline and overall poor status.  Was brought to the ED for uncontrollable pain with home medications.  Also had hematemesis and agitation.  Patient was admitted for comfort measures only, not a candidate for EGD.  No life-prolonging measures.  Patient was started on fentanyl as needed and was also started on fentanyl infusion.  Hospital Course:  Pancreatic cancer/actively dying Patient was comfortable, started on fentanyl as needed.  Also started on fentanyl infusion for comfort. Patient expired at [place]   Time: Of death 0850  Signed:  [facility] 04-28-22, 9:49 AM

## 2022-04-15 NOTE — Progress Notes (Signed)
Patient expired at 85, death confirmed by 2 RNs, MD made aware, family remains at beside

## 2022-04-15 NOTE — Assessment & Plan Note (Addendum)
This patient is actively dying of pancreatic cancer, hemodynamic shock, presumably hemorrhagic shock from UGIB given the hematemesis. 1. Pt is on hospice at baseline, doing poorly at baseline with declining status.  Brought in by wife this evening due to hematemesis, uncontrolled pain and agitation. 2. Goals of care at this point are patient comfort. 3. Per wife: she understands hes not a good candidate for EGD. 4. Understands that ICU admit could potentially prolong his life, but would really just be prolonging his suffering at this point. 1. I have recommended full comfort measures at this point with no further needle sticks, ICU care, etc. 2. Wife agrees and wants him to be kept comfortable. 5. Will put in for pal care consult for AM 6. But given vital signs, would suspect in-hospital death most likely.  Possibly even tonight. 1. Communicated the above with wife who is going to call and let family know. 7. Fentanyl GTT and PRN

## 2022-04-15 NOTE — H&P (Signed)
History and Physical    Patient: Aaron Johnston ZWC:[id number] DOB: [birth year] DOA: 03/22/2022 DOS: the patient was seen and examined on 04/12/22 PCP: [name], [name], MD  Patient coming from: Home  Chief Complaint:  Chief Complaint  Patient presents with   Chest Pain   Hemoptysis   HPI: Mantaj Chamberlin is a 76 y.o. male with medical history significant of pancreatic cancer for past 2 years, now on hospice for pancreatic cancer, functional decline, and poor overall status.  Pt on home hospice at baseline.  Today however having uncontrollable pain with home meds, hematemesis, and agitation.  Brought in to ED by wife.  Pt not really mentating at the moment and unable to contribute to history.  Review of Systems: unable to review all systems due to the inability of the patient to answer questions. Past Medical History:  Diagnosis Date   Acute kidney injury superimposed on chronic kidney disease ([place]) 02/09/2022   Cancer ([place])    Diabetes mellitus without complication (HCC)    Family history of breast cancer    Family history of colon cancer    Family history of pancreatic cancer    Family history of stomach cancer    Past Surgical History:  Procedure Laterality Date   BIOPSY  08/29/2021   Procedure: BIOPSY;  Surgeon: Otis Brace, MD;  Location: [facility];  Service: Gastroenterology;;   COLONOSCOPY WITH PROPOFOL N/A 08/29/2021   Procedure: COLONOSCOPY WITH PROPOFOL;  Surgeon: Otis Brace, MD;  Location: [facility];  Service: Gastroenterology;  Laterality: N/A;   ESOPHAGEAL BANDING  09/17/2021   Procedure: ESOPHAGEAL BANDING;  Surgeon: Otis Brace, MD;  Location: [facility];  Service: Gastroenterology;;   ESOPHAGEAL BANDING N/A 11/03/2021   Procedure: ESOPHAGEAL BANDING;  Surgeon: Otis Brace, MD;  Location: [facility];  Service: Gastroenterology;  Laterality: N/A;   ESOPHAGOGASTRODUODENOSCOPY (EGD) WITH PROPOFOL N/A 08/27/2021   Procedure:  ESOPHAGOGASTRODUODENOSCOPY (EGD) WITH PROPOFOL;  Surgeon: Clarene Essex, MD;  Location: [facility];  Service: Endoscopy;  Laterality: N/A;   ESOPHAGOGASTRODUODENOSCOPY (EGD) WITH PROPOFOL N/A 09/17/2021   Procedure: ESOPHAGOGASTRODUODENOSCOPY (EGD) WITH PROPOFOL;  Surgeon: Otis Brace, MD;  Location: [facility];  Service: Gastroenterology;  Laterality: N/A;   ESOPHAGOGASTRODUODENOSCOPY (EGD) WITH PROPOFOL N/A 11/03/2021   Procedure: ESOPHAGOGASTRODUODENOSCOPY (EGD) WITH PROPOFOL;  Surgeon: Otis Brace, MD;  Location: [facility];  Service: Gastroenterology;  Laterality: N/A;   Social History:  reports that he quit smoking about 41 years ago. His smoking use included cigarettes. He has never used smokeless tobacco. He reports that he does not currently use alcohol. He reports that he does not use drugs.  No Known Allergies  Family History  Problem Relation Age of Onset   Diabetes Mother    Pancreatic cancer Father        d. 18   Breast cancer Sister 16   Diabetes Sister    Diabetes Brother    Stomach cancer Paternal Aunt        2 pat aunts with stomach cancer   Colon cancer Paternal Uncle    Cancer Paternal Grandfather        NOS   Diabetes Sister    Diabetes Brother    Brain cancer Paternal Aunt    Cancer Paternal Aunt        eye   Diabetes Son     Prior to Admission medications   Medication Sig Start Date End Date Taking? Authorizing Provider  diphenoxylate-atropine (LOMOTIL) 2.5-0.025 MG tablet TAKE 1-2 TABLETS BY MOUTH 4 (FOUR) TIMES DAILY AS  NEEDED FOR DIARRHEA OR LOOSE STOOLS. 02/22/22  Yes Ladell Pier, MD  HYDROcodone-acetaminophen (NORCO/VICODIN) 5-325 MG tablet Take 1-2 tablets by mouth every 4 (four) hours as needed. Patient taking differently: Take 1 tablet by mouth every 4 (four) hours as needed. 01/04/22  Yes Owens Shark, NP  latanoprost (XALATAN) 0.005 % ophthalmic solution Place 1 drop into both eyes at bedtime.   Yes [provider]   loperamide (IMODIUM) 2 MG capsule Take 4 mg by mouth 3 (three) times daily as needed for diarrhea or loose stools.   Yes [provider]  loratadine (CLARITIN) 10 MG tablet Take 10 mg by mouth daily as needed for allergies.   Yes [provider]  ondansetron (ZOFRAN) 8 MG tablet TAKE 1 TABLET (8 MG TOTAL) BY MOUTH EVERY 8 (EIGHT) HOURS AS NEEDED FOR NAUSEA OR VOMITING. 03/18/22  Yes Owens Shark, NP  pantoprazole (PROTONIX) 40 MG tablet TAKE 1 TABLET BY MOUTH TWICE A DAY Patient taking differently: 40 mg 2 (two) times daily. 12/28/21  Yes Ladell Pier, MD  prochlorperazine (COMPAZINE) 10 MG tablet Take 1 tablet (10 mg total) by mouth every 6 (six) hours as needed for nausea. 01/01/22  Yes Owens Shark, NP  Accu-Chek FastClix Lancets MISC Apply topically. 08/30/20   [provider]  ACCU-CHEK GUIDE test strip  08/31/20   [provider]  Blood Glucose Monitoring Suppl (ACCU-CHEK GUIDE) w/Device KIT See admin instructions. 08/03/20   [provider]  KLOR-CON M20 20 MEQ tablet TAKE 1 TABLET BY MOUTH TWICE A DAY Patient not taking: Reported on April 20, 2022 03/18/22   Owens Shark, NP  lidocaine-prilocaine (EMLA) cream Apply 1 application topically as directed. Apply to port site 1 hour prior to stick and cover with plastic wrap Patient not taking: Reported on 04/20/22 10/19/21   Owens Shark, NP  lipase/protease/amylase (CREON) 36000 UNITS CPEP capsule Take 2 capsules (72,000 Units total) by mouth 3 (three) times daily with meals. May also take 1 capsule (36,000 Units total) as needed. Patient not taking: Reported on April 20, 2022 08/06/21   Owens Shark, NP  magnesium oxide (MAG-OX) 400 (240 Mg) MG tablet TAKE 1 TABLET BY MOUTH TWICE A DAY Patient not taking: Reported on 04-20-2022 01/08/22   Ladell Pier, MD  mirabegron ER (MYRBETRIQ) 25 MG TB24 tablet Take 1 tablet (25 mg total) by mouth daily for 30 doses. Patient not taking: Reported on April 20, 2022  02/16/22 04/09/23  Vashti Hey, MD  pioglitazone (ACTOS) 30 MG tablet Take 30 mg by mouth daily. Patient not taking: Reported on 04-20-2022 02/01/22   [provider]    Physical Exam: Vitals:   Apr 20, 2022 0215 04/20/2022 0230 April 20, 2022 0245 2022-04-20 0300  BP: (!) 83/71 (!) 79/52 (!) 64/48 (!) 77/52  Pulse:      Resp:      Temp:      TempSrc:      SpO2:      Weight:      Height:       Constitutional: Ill appearing, cachectic. Eyes: PERRL, lids and conjunctivae normal ENMT: Mucous membranes are moist. Posterior pharynx clear of any exudate or lesions.Normal dentition.  Neck: normal, supple, no masses, no thyromegaly Respiratory: clear to auscultation bilaterally, no wheezing, no crackles. Normal respiratory effort. No accessory muscle use.  Cardiovascular: Regular rate and rhythm, no murmurs / rubs / gallops. No extremity edema. 2+ pedal pulses. No carotid bruits.  Abdomen: TTP Musculoskeletal: no clubbing / cyanosis. No  joint deformity upper and lower extremities. Good ROM, no contractures. Normal muscle tone.  Skin: no rashes, lesions, ulcers. No induration Neurologic: Eyes open, but non-verbal.  Not really able to follow commands. Psychiatric: Non verbal at the moment  Data Reviewed:       Latest Ref Rng & Units 05/03/22   12:00 AM 03/05/2022   10:55 AM 02/19/2022    8:20 AM  CBC  WBC 4.0 - 10.5 K/uL 6.3   14.5   18.9    Hemoglobin 13.0 - 17.0 g/dL 8.8   7.5   10.2    Hematocrit 39.0 - 52.0 % 29.1   24.2   33.3    Platelets 150 - 400 K/uL 87   100   220        Latest Ref Rng & Units 05/03/22   12:00 AM 03/05/2022   10:55 AM 02/19/2022    8:20 AM  BMP  Glucose 70 - 99 mg/dL 187   146   167    BUN 8 - 23 mg/dL 79   39   23    Creatinine 0.61 - 1.24 mg/dL 4.93   3.53   3.16    Sodium 135 - 145 mmol/L 144   140   142    Potassium 3.5 - 5.1 mmol/L 4.5   4.2   4.8    Chloride 98 - 111 mmol/L 121   121   120    CO2 22 - 32 mmol/L _0 Calcium 8.9  - 10.3 mg/dL 8.1   8.9   8.4       Assessment and Plan: * Admission for end of life care This patient is actively dying of pancreatic cancer, hemodynamic shock, presumably hemorrhagic shock from UGIB given the hematemesis. Pt is on hospice at baseline, doing poorly at baseline with declining status.  Brought in by wife this evening due to hematemesis, uncontrolled pain and agitation. Goals of care at this point are patient comfort. Per wife: she understands hes not a good candidate for EGD. Understands that ICU admit could potentially prolong his life, but would really just be prolonging his suffering at this point. I have recommended full comfort measures at this point with no further needle sticks, ICU care, etc. Wife agrees and wants him to be kept comfortable. Will put in for pal care consult for AM But given vital signs, would suspect in-hospital death most likely.  Possibly even tonight. Communicated the above with wife who is going to call and let family know. Fentanyl GTT and PRN  Hemorrhagic shock (HCC) Shock with hypotension, presumably hemorrhagic in setting of UGIB with hematemesis.  UGIB (upper gastrointestinal bleed) Wife agrees: not a good candidate for EGD. At this point, will minimize needle sticks (no further lab draws). Comfort measures only.  Primary cancer of head of pancreas ([place]) Pt with 2 year history of pancreatic cancer, now on hospice.      Advance Care Planning:   Code Status: DNR  Consults: Pal care consult for AM  Family Communication: Wife at bedside  Severity of Illness: The appropriate patient status for this patient is INPATIENT. Inpatient status is judged to be reasonable and necessary in order to provide the required intensity of service to ensure the patient's safety. The patient's presenting symptoms, physical exam findings, and initial radiographic and laboratory data in the context of their chronic comorbidities is felt to place them at  high risk for further  clinical deterioration. Furthermore, it is not anticipated that the patient will be medically stable for discharge from the hospital within 2 midnights of admission.   * I certify that at the point of admission it is my clinical judgment that the patient will require inpatient hospital care spanning beyond 2 midnights from the point of admission due to high intensity of service, high risk for further deterioration and high frequency of surveillance required.*  Author: Etta Quill., DO 04-16-2022 3:20 AM  For on call review [email or web address].

## 2022-04-15 NOTE — Plan of Care (Signed)
  Problem: Education: Goal: Knowledge of the prescribed therapeutic regimen will improve Outcome: Progressing   

## 2022-04-15 NOTE — ED Notes (Signed)
Bair Hugger removed per patients request. Warm blankets applied.

## 2022-04-15 NOTE — Plan of Care (Signed)
  Problem: Education: Goal: Knowledge of the prescribed therapeutic regimen will improve April 30, 2022 0430 by Kennith Center, RN Outcome: Progressing April 30, 2022 0427 by Kennith Center, RN Outcome: Progressing   Problem: Coping: Goal: Ability to identify and develop effective coping behavior will improve Outcome: Progressing

## 2022-04-15 NOTE — TOC Transition Note (Signed)
Transition of Care [facility]) - CM/SW Discharge Note  Patient Details  Name: Aaron Johnston MRN: [id number] Date of Birth: 13-May-1946  [facility] CM/SW Contact:  Sherie Don, LCSW Phone Number: May 01, 2022, 10:30 AM  Clinical Narrative: Patient expired this morning. CSW notified Gar Ponto with [facility].  Final next level of care: Expired Barriers to Discharge: No Barriers Identified  Readmission Risk Interventions     View : No data to display.

## 2022-04-15 NOTE — Assessment & Plan Note (Signed)
Wife agrees: not a good candidate for EGD. At this point, will minimize needle sticks (no further lab draws). Comfort measures only.

## 2022-04-15 DEATH — deceased

## 2022-04-16 ENCOUNTER — Inpatient Hospital Stay: Payer: Medicare Other | Admitting: Oncology

## 2022-04-16 ENCOUNTER — Inpatient Hospital Stay: Payer: Medicare Other

## 2022-04-21 ENCOUNTER — Encounter: Payer: Self-pay | Admitting: *Deleted

## 2022-04-21 NOTE — Progress Notes (Signed)
Received request for medical records for [employer]. Forwarded to HIM and made [facility] aware that he is deceased as of 04-10-22.

## 2023-03-12 IMAGING — CT CT ABD-PELV W/O CM
2 of 4 series · 15 of 46 positions shown, 17 images · non-contrast
Comparison: 02/06/2022

CLINICAL DATA: UGI bleed, nonvariceal, postsurgical or traumatic,
endoscopy contraindicated Retroperitoneal hematoma, known or
suspected (Ped 0-17y) h/o varices and pancreatic ca but no
hematemesis or melena



[Series 3: a/p w/o 5mm · axial · non-contrast · 0.74mm/px · z∈[-784,-349]mm · 12 of 101 slices shown, 14 images]
[im 7/101  soft-tissue]
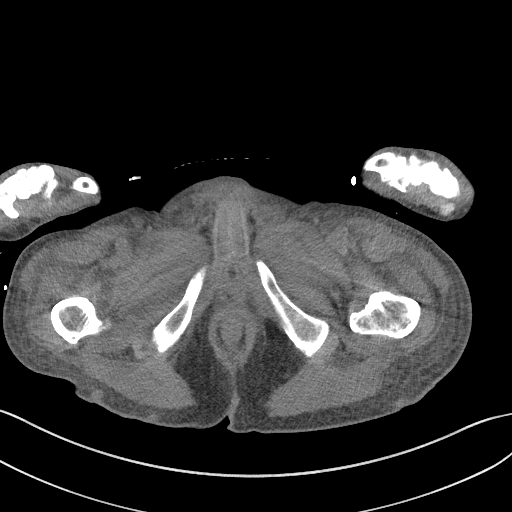
[im 7/101  bone]
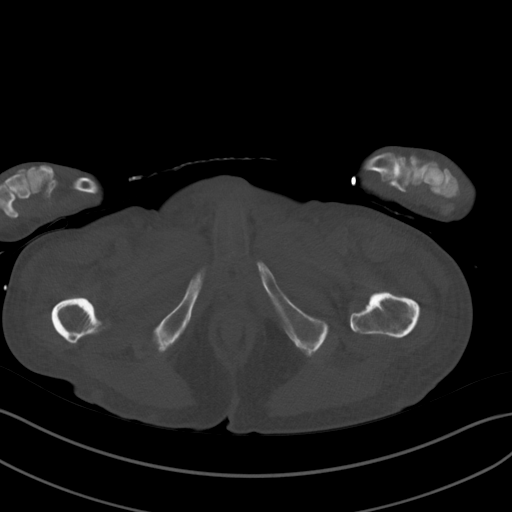
[im 14/101  soft-tissue]
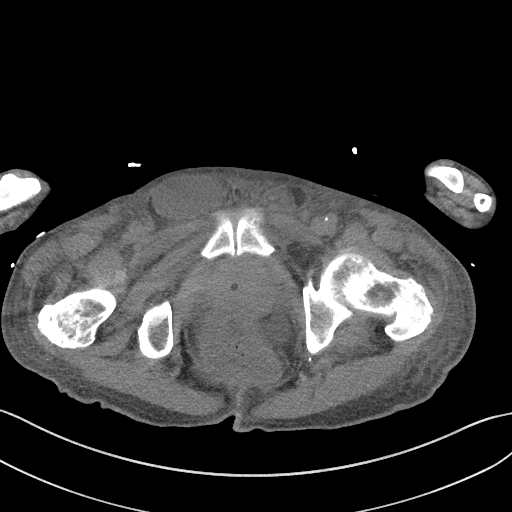
[im 21/101  soft-tissue]
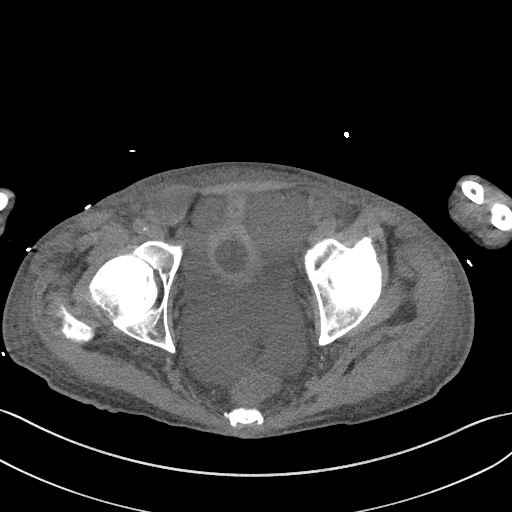
[im 34/101  soft-tissue]
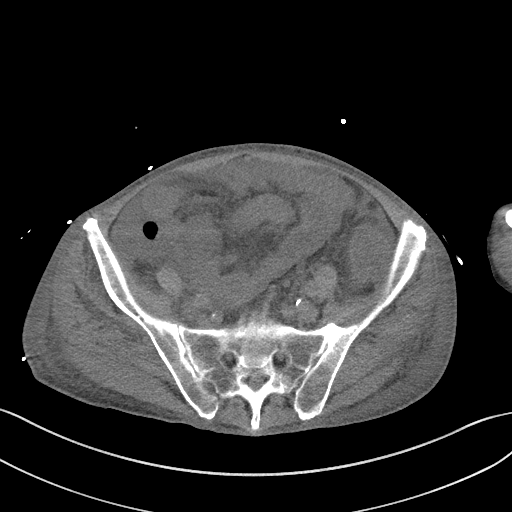
[im 41/101  soft-tissue]
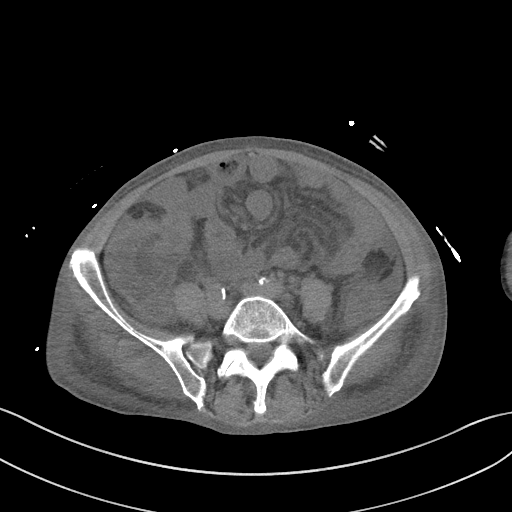
[im 47/101  soft-tissue]
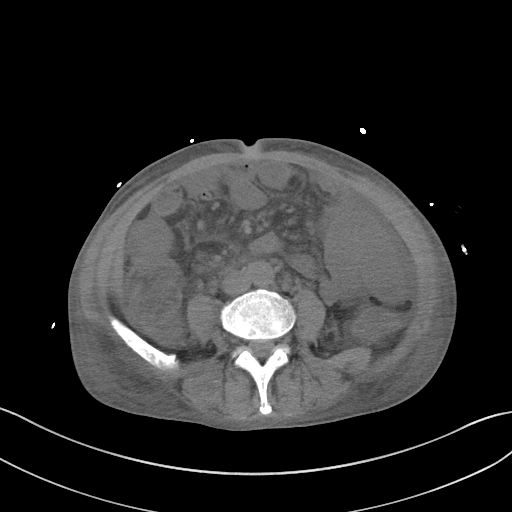
[im 54/101  soft-tissue]
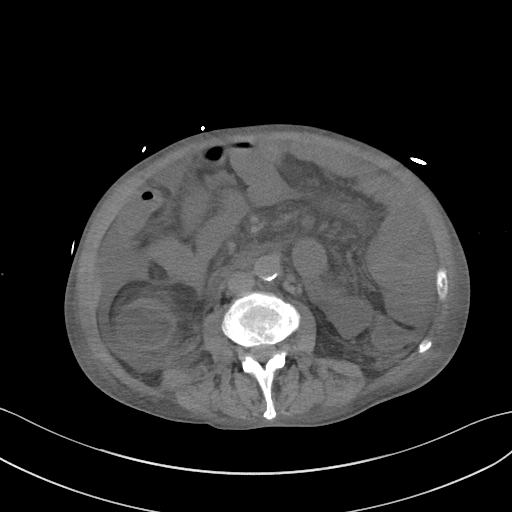
[im 61/101  soft-tissue]
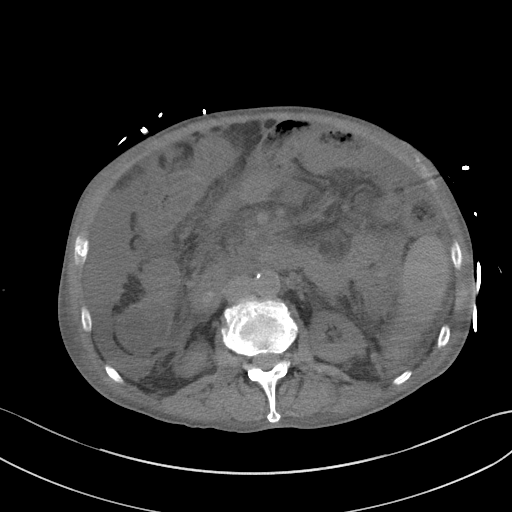
[im 67/101  soft-tissue]
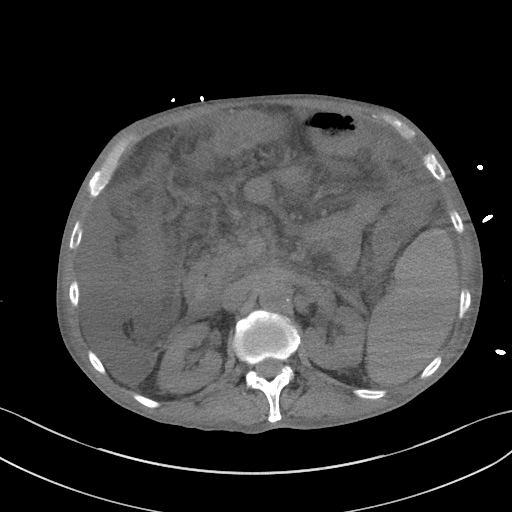
[im 67/101  bone]
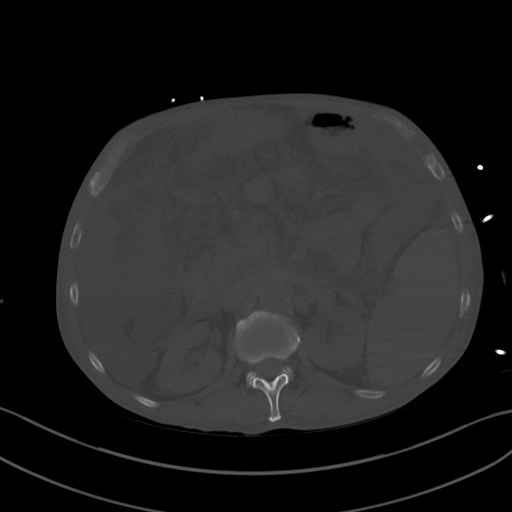
[im 81/101  soft-tissue]
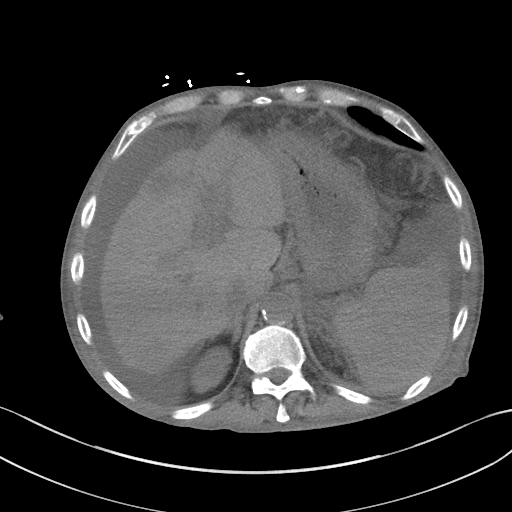
[im 87/101  soft-tissue]
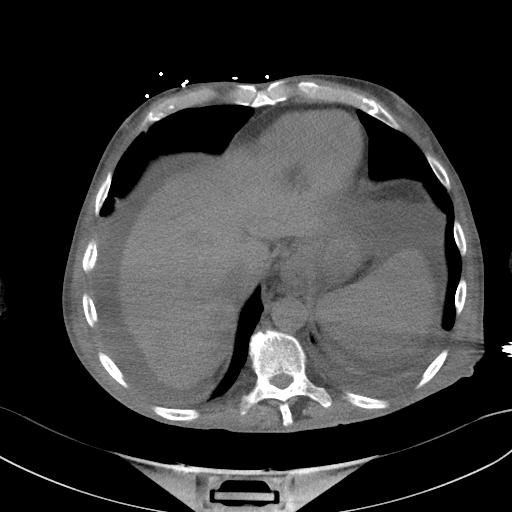
[im 94/101  soft-tissue]
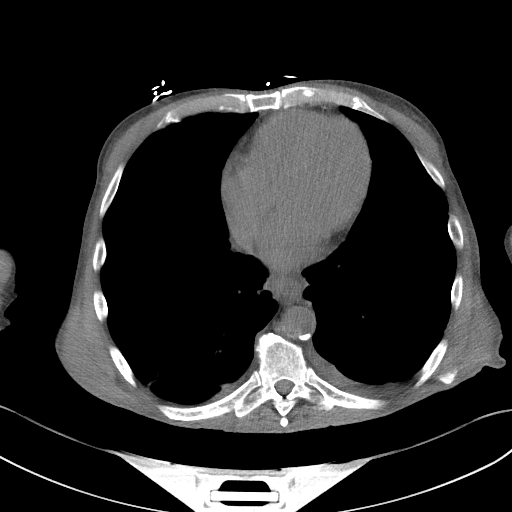

[Series 6: a/p w/o cor · coronal · non-contrast · 0.75mm/px · 3 of 151 slices shown]
[im 51/151  soft-tissue]
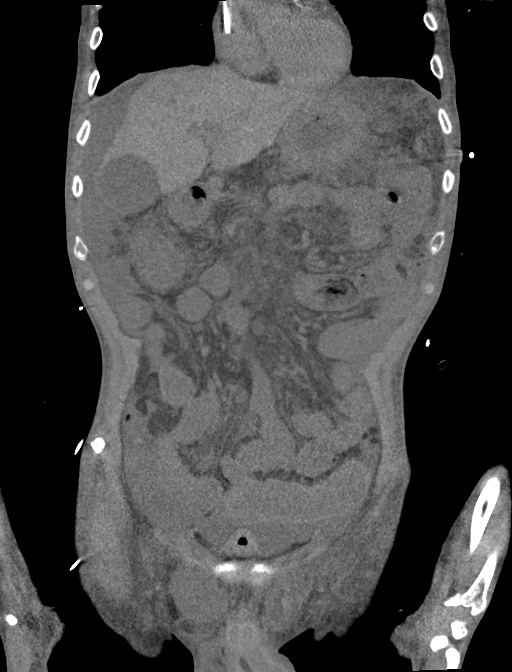
[im 67/151  soft-tissue]
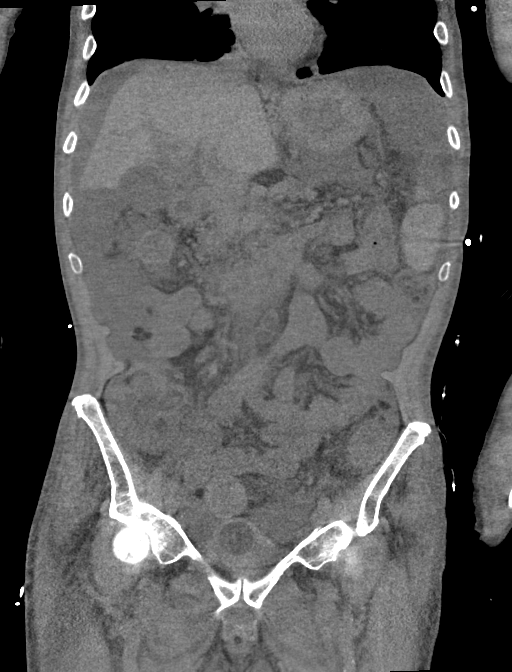
[im 84/151  soft-tissue]
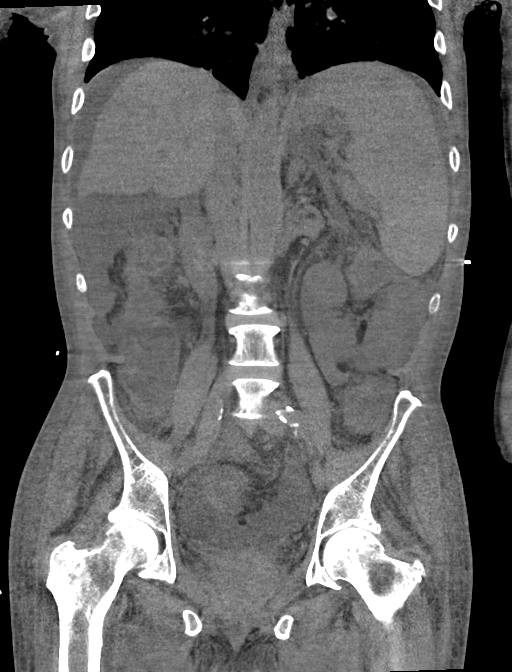

[15 of 46 positions shown; findings below may reference images not displayed]

FINDINGS: Lower chest: Trace left pleural effusion.  Bibasilar scarring.

Hepatobiliary: Numerous hepatic metastases are again noted,
unchanged since recent study. Gallbladder is mildly distended,
stable. No visible stones or biliary ductal dilatation.

Pancreas: Limited evaluation without intravenous contrast and due to
surrounding mesenteric edema. Indistinct, mildly prominent
pancreatic head region with atrophy of the body and tail. No change
since recent study.

Spleen: Splenomegaly.  Low-density lesion anteriorly is unchanged.

Adrenals/Urinary Tract: No adrenal abnormality. No focal renal
abnormality. No stones or hydronephrosis. Foley catheter in the
bladder which is decompressed.

Stomach/Bowel: Evaluation limited due to lack of oral and IV
contrast and mesenteric edema and ascites. There is apparent wall
thickening seen throughout much of the colon, most pronounced in the
right colon and proximal transverse colon. There also appears to be
wall thickening within mildly prominent left abdominal small bowel
loops. Findings concerning for colitis and enteritis.

Vascular/Lymphatic: Aortic atherosclerosis. No evidence of aneurysm
or adenopathy.

Reproductive: Mildly prominent prostate.

Other: Moderate ascites, increasing since prior study. Edema
throughout the mesentery also increasing since prior study.

Musculoskeletal: No acute bony abnormality. Right inguinal hernia
containing fat and ascites.
IMPRESSION: Indistinct, prominent appearance of the pancreatic head is similar
to prior study with pancreatic body and tail atrophy, unchanged.

Hepatic metastases are stable since recent study.

Stable splenomegaly and anterior low-density lesion which cannot be
characterized on this noncontrast study.

Moderate ascites, increasing since prior study. Increasing
mesenteric edema. No evidence for retroperitoneal hematoma.

Wall thickening throughout much of the colon, most pronounced in the
right colon and transverse colon compatible with colitis. Wall
thickening also seen within left abdominal small bowel loops
suggesting enteritis.

## 2023-08-29 NOTE — Telephone Encounter (Signed)
error 

## 2024-08-02 NOTE — Progress Notes (Signed)
 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 Amended report date is 06/13/2024.

## 2024-08-30 ENCOUNTER — Other Ambulatory Visit: Payer: Self-pay
